# Patient Record
Sex: Female | Born: 1945 | Race: Black or African American | Hispanic: No | Smoking: Current every day smoker
Health system: Southern US, Community
[De-identification: ages and names within clinical notes are randomized; demographics above are authoritative.]

## PROBLEM LIST (undated history)

## (undated) DIAGNOSIS — G459 Transient cerebral ischemic attack, unspecified: Secondary | ICD-10-CM

## (undated) DIAGNOSIS — L309 Dermatitis, unspecified: Secondary | ICD-10-CM

## (undated) DIAGNOSIS — D649 Anemia, unspecified: Secondary | ICD-10-CM

## (undated) DIAGNOSIS — I499 Cardiac arrhythmia, unspecified: Secondary | ICD-10-CM

## (undated) DIAGNOSIS — N761 Subacute and chronic vaginitis: Secondary | ICD-10-CM

## (undated) DIAGNOSIS — I639 Cerebral infarction, unspecified: Secondary | ICD-10-CM

## (undated) DIAGNOSIS — E785 Hyperlipidemia, unspecified: Secondary | ICD-10-CM

## (undated) DIAGNOSIS — M1990 Unspecified osteoarthritis, unspecified site: Secondary | ICD-10-CM

## (undated) DIAGNOSIS — I6302 Cerebral infarction due to thrombosis of basilar artery: Secondary | ICD-10-CM

## (undated) DIAGNOSIS — M1712 Unilateral primary osteoarthritis, left knee: Secondary | ICD-10-CM

## (undated) DIAGNOSIS — K219 Gastro-esophageal reflux disease without esophagitis: Secondary | ICD-10-CM

## (undated) DIAGNOSIS — F419 Anxiety disorder, unspecified: Secondary | ICD-10-CM

## (undated) DIAGNOSIS — I10 Essential (primary) hypertension: Secondary | ICD-10-CM

## (undated) DIAGNOSIS — IMO0001 Reserved for inherently not codable concepts without codable children: Secondary | ICD-10-CM

## (undated) DIAGNOSIS — N83209 Unspecified ovarian cyst, unspecified side: Secondary | ICD-10-CM

## (undated) DIAGNOSIS — M47816 Spondylosis without myelopathy or radiculopathy, lumbar region: Secondary | ICD-10-CM

## (undated) HISTORY — DX: Cerebral infarction due to thrombosis of basilar artery: I63.02

## (undated) HISTORY — DX: Unilateral primary osteoarthritis, left knee: M17.12

## (undated) HISTORY — DX: Transient cerebral ischemic attack, unspecified: G45.9

## (undated) HISTORY — DX: Dermatitis, unspecified: L30.9

## (undated) HISTORY — DX: Subacute and chronic vaginitis: N76.1

## (undated) HISTORY — DX: Unspecified ovarian cyst, unspecified side: N83.209

## (undated) HISTORY — DX: Cerebral infarction, unspecified: I63.9

## (undated) HISTORY — DX: Spondylosis without myelopathy or radiculopathy, lumbar region: M47.816

---

## 1979-05-15 HISTORY — PX: OVARIAN CYST SURGERY: SHX726

## 1998-08-18 ENCOUNTER — Emergency Department (HOSPITAL_COMMUNITY): Admission: EM | Admit: 1998-08-18 | Discharge: 1998-08-18 | Payer: Self-pay | Admitting: Emergency Medicine

## 1999-01-11 ENCOUNTER — Emergency Department (HOSPITAL_COMMUNITY): Admission: EM | Admit: 1999-01-11 | Discharge: 1999-01-11 | Payer: Self-pay

## 2000-04-20 ENCOUNTER — Other Ambulatory Visit: Admission: RE | Admit: 2000-04-20 | Discharge: 2000-04-20 | Payer: Self-pay | Admitting: Family Medicine

## 2000-11-26 ENCOUNTER — Emergency Department (HOSPITAL_COMMUNITY): Admission: EM | Admit: 2000-11-26 | Discharge: 2000-11-26 | Payer: Self-pay | Admitting: *Deleted

## 2001-02-27 ENCOUNTER — Emergency Department (HOSPITAL_COMMUNITY): Admission: EM | Admit: 2001-02-27 | Discharge: 2001-02-27 | Payer: Self-pay | Admitting: Emergency Medicine

## 2001-08-09 ENCOUNTER — Emergency Department (HOSPITAL_COMMUNITY): Admission: EM | Admit: 2001-08-09 | Discharge: 2001-08-09 | Payer: Self-pay | Admitting: Emergency Medicine

## 2001-08-09 ENCOUNTER — Encounter: Payer: Self-pay | Admitting: Emergency Medicine

## 2002-02-01 ENCOUNTER — Emergency Department (HOSPITAL_COMMUNITY): Admission: EM | Admit: 2002-02-01 | Discharge: 2002-02-01 | Payer: Self-pay

## 2002-03-06 ENCOUNTER — Encounter: Payer: Self-pay | Admitting: Emergency Medicine

## 2002-03-06 ENCOUNTER — Emergency Department (HOSPITAL_COMMUNITY): Admission: EM | Admit: 2002-03-06 | Discharge: 2002-03-06 | Payer: Self-pay | Admitting: *Deleted

## 2002-08-05 ENCOUNTER — Emergency Department (HOSPITAL_COMMUNITY): Admission: EM | Admit: 2002-08-05 | Discharge: 2002-08-05 | Payer: Self-pay | Admitting: Emergency Medicine

## 2004-09-15 ENCOUNTER — Ambulatory Visit: Payer: Self-pay | Admitting: Internal Medicine

## 2004-09-25 ENCOUNTER — Ambulatory Visit: Payer: Self-pay | Admitting: Internal Medicine

## 2005-06-02 ENCOUNTER — Ambulatory Visit (HOSPITAL_COMMUNITY): Admission: RE | Admit: 2005-06-02 | Discharge: 2005-06-02 | Payer: Self-pay | Admitting: Internal Medicine

## 2005-06-02 ENCOUNTER — Ambulatory Visit: Payer: Self-pay | Admitting: Internal Medicine

## 2005-11-08 ENCOUNTER — Ambulatory Visit: Payer: Self-pay | Admitting: Internal Medicine

## 2006-04-05 ENCOUNTER — Ambulatory Visit: Payer: Self-pay | Admitting: Internal Medicine

## 2006-04-18 ENCOUNTER — Ambulatory Visit: Payer: Self-pay | Admitting: Internal Medicine

## 2006-05-08 ENCOUNTER — Emergency Department (HOSPITAL_COMMUNITY): Admission: EM | Admit: 2006-05-08 | Discharge: 2006-05-08 | Payer: Self-pay | Admitting: Family Medicine

## 2006-05-30 ENCOUNTER — Emergency Department (HOSPITAL_COMMUNITY): Admission: EM | Admit: 2006-05-30 | Discharge: 2006-05-30 | Payer: Self-pay | Admitting: Emergency Medicine

## 2006-07-29 DIAGNOSIS — I10 Essential (primary) hypertension: Secondary | ICD-10-CM

## 2006-07-29 DIAGNOSIS — K649 Unspecified hemorrhoids: Secondary | ICD-10-CM | POA: Insufficient documentation

## 2006-07-29 DIAGNOSIS — F17200 Nicotine dependence, unspecified, uncomplicated: Secondary | ICD-10-CM | POA: Insufficient documentation

## 2007-09-03 ENCOUNTER — Emergency Department (HOSPITAL_COMMUNITY): Admission: EM | Admit: 2007-09-03 | Discharge: 2007-09-03 | Payer: Self-pay | Admitting: Family Medicine

## 2007-10-25 ENCOUNTER — Emergency Department (HOSPITAL_COMMUNITY): Admission: EM | Admit: 2007-10-25 | Discharge: 2007-10-25 | Payer: Self-pay | Admitting: Family Medicine

## 2007-11-29 ENCOUNTER — Ambulatory Visit: Payer: Self-pay | Admitting: Hospitalist

## 2007-11-29 ENCOUNTER — Encounter (INDEPENDENT_AMBULATORY_CARE_PROVIDER_SITE_OTHER): Payer: Self-pay | Admitting: Internal Medicine

## 2007-11-29 LAB — CONVERTED CEMR LAB
Glucose, Bld: 89 mg/dL (ref 70–99)
Potassium: 4 meq/L (ref 3.5–5.3)
Sodium: 142 meq/L (ref 135–145)

## 2008-05-13 ENCOUNTER — Emergency Department (HOSPITAL_COMMUNITY): Admission: EM | Admit: 2008-05-13 | Discharge: 2008-05-13 | Payer: Self-pay | Admitting: Emergency Medicine

## 2008-07-04 ENCOUNTER — Emergency Department (HOSPITAL_COMMUNITY): Admission: EM | Admit: 2008-07-04 | Discharge: 2008-07-04 | Payer: Self-pay | Admitting: Emergency Medicine

## 2008-08-09 ENCOUNTER — Emergency Department (HOSPITAL_COMMUNITY): Admission: EM | Admit: 2008-08-09 | Discharge: 2008-08-09 | Payer: Self-pay | Admitting: Emergency Medicine

## 2008-10-20 ENCOUNTER — Emergency Department (HOSPITAL_COMMUNITY): Admission: EM | Admit: 2008-10-20 | Discharge: 2008-10-20 | Payer: Self-pay | Admitting: Family Medicine

## 2008-12-11 ENCOUNTER — Emergency Department (HOSPITAL_COMMUNITY): Admission: EM | Admit: 2008-12-11 | Discharge: 2008-12-11 | Payer: Self-pay | Admitting: Family Medicine

## 2008-12-15 ENCOUNTER — Emergency Department (HOSPITAL_COMMUNITY): Admission: EM | Admit: 2008-12-15 | Discharge: 2008-12-15 | Payer: Self-pay | Admitting: Family Medicine

## 2008-12-20 ENCOUNTER — Encounter: Payer: Self-pay | Admitting: Internal Medicine

## 2008-12-20 ENCOUNTER — Ambulatory Visit: Payer: Self-pay | Admitting: Internal Medicine

## 2008-12-20 DIAGNOSIS — J309 Allergic rhinitis, unspecified: Secondary | ICD-10-CM | POA: Insufficient documentation

## 2008-12-20 DIAGNOSIS — M170 Bilateral primary osteoarthritis of knee: Secondary | ICD-10-CM

## 2008-12-24 ENCOUNTER — Encounter (INDEPENDENT_AMBULATORY_CARE_PROVIDER_SITE_OTHER): Payer: Self-pay | Admitting: *Deleted

## 2008-12-24 ENCOUNTER — Ambulatory Visit: Payer: Self-pay | Admitting: Sports Medicine

## 2008-12-24 DIAGNOSIS — Z8739 Personal history of other diseases of the musculoskeletal system and connective tissue: Secondary | ICD-10-CM

## 2008-12-25 ENCOUNTER — Telehealth (INDEPENDENT_AMBULATORY_CARE_PROVIDER_SITE_OTHER): Payer: Self-pay | Admitting: Internal Medicine

## 2008-12-25 ENCOUNTER — Telehealth (INDEPENDENT_AMBULATORY_CARE_PROVIDER_SITE_OTHER): Payer: Self-pay | Admitting: *Deleted

## 2009-01-10 ENCOUNTER — Ambulatory Visit: Payer: Self-pay | Admitting: Infectious Disease

## 2009-01-21 ENCOUNTER — Telehealth: Payer: Self-pay | Admitting: Internal Medicine

## 2009-02-17 ENCOUNTER — Encounter (INDEPENDENT_AMBULATORY_CARE_PROVIDER_SITE_OTHER): Payer: Self-pay | Admitting: Internal Medicine

## 2009-02-17 ENCOUNTER — Telehealth (INDEPENDENT_AMBULATORY_CARE_PROVIDER_SITE_OTHER): Payer: Self-pay | Admitting: Internal Medicine

## 2009-07-01 ENCOUNTER — Emergency Department (HOSPITAL_COMMUNITY): Admission: EM | Admit: 2009-07-01 | Discharge: 2009-07-01 | Payer: Self-pay | Admitting: Emergency Medicine

## 2009-10-06 ENCOUNTER — Emergency Department (HOSPITAL_COMMUNITY): Admission: EM | Admit: 2009-10-06 | Discharge: 2009-10-06 | Payer: Self-pay | Admitting: Emergency Medicine

## 2009-11-10 ENCOUNTER — Emergency Department (HOSPITAL_COMMUNITY): Admission: EM | Admit: 2009-11-10 | Discharge: 2009-11-10 | Payer: Self-pay | Admitting: Family Medicine

## 2009-11-16 ENCOUNTER — Emergency Department (HOSPITAL_COMMUNITY): Admission: EM | Admit: 2009-11-16 | Discharge: 2009-11-16 | Payer: Self-pay | Admitting: Family Medicine

## 2010-01-26 ENCOUNTER — Ambulatory Visit: Payer: Self-pay | Admitting: Infectious Disease

## 2010-01-26 ENCOUNTER — Telehealth (INDEPENDENT_AMBULATORY_CARE_PROVIDER_SITE_OTHER): Payer: Self-pay | Admitting: Internal Medicine

## 2010-03-17 ENCOUNTER — Emergency Department (HOSPITAL_COMMUNITY): Admission: EM | Admit: 2010-03-17 | Discharge: 2010-03-17 | Payer: Self-pay | Admitting: Emergency Medicine

## 2010-05-20 ENCOUNTER — Emergency Department (HOSPITAL_COMMUNITY): Admission: EM | Admit: 2010-05-20 | Discharge: 2010-05-21 | Payer: Self-pay | Admitting: Emergency Medicine

## 2010-05-24 ENCOUNTER — Emergency Department (HOSPITAL_COMMUNITY): Admission: EM | Admit: 2010-05-24 | Discharge: 2010-05-24 | Payer: Self-pay | Admitting: Emergency Medicine

## 2010-07-29 ENCOUNTER — Emergency Department (HOSPITAL_COMMUNITY): Admission: EM | Admit: 2010-07-29 | Discharge: 2010-07-29 | Payer: Self-pay | Admitting: Family Medicine

## 2010-09-28 ENCOUNTER — Emergency Department (HOSPITAL_COMMUNITY)
Admission: EM | Admit: 2010-09-28 | Discharge: 2010-09-28 | Payer: Self-pay | Source: Home / Self Care | Admitting: Emergency Medicine

## 2010-10-05 ENCOUNTER — Emergency Department (HOSPITAL_COMMUNITY)
Admission: EM | Admit: 2010-10-05 | Discharge: 2010-10-05 | Payer: Self-pay | Source: Home / Self Care | Admitting: Emergency Medicine

## 2010-10-06 LAB — URIC ACID: Uric Acid, Serum: 9.2 mg/dL — ABNORMAL HIGH (ref 2.4–7.0)

## 2010-10-12 ENCOUNTER — Emergency Department (HOSPITAL_COMMUNITY)
Admission: EM | Admit: 2010-10-12 | Discharge: 2010-10-12 | Payer: Self-pay | Source: Home / Self Care | Admitting: Emergency Medicine

## 2010-10-13 NOTE — Progress Notes (Signed)
Summary: TB Skin Test  Phone Note Call from Patient   Caller: Patient Summary of Call: Pt here wants a TB skin Test for work.Angelina Ok RN  Jan 26, 2010 9:10 AM     Initial call taken by: Angelina Ok RN,  Jan 26, 2010 9:10 AM  Follow-up for Phone Call        TB skin test is fine.  She does need to come in for an annual visit to follow up on chronic medical problems for which we prescribe medications.      Immunizations Administered:  PPD Skin Test:    Vaccine Type: PPD    Site: right forearm    Mfr: San[employer]i Pasteur    Dose: 0.1 ml    Route: ID    Given by: Angelina Ok RN    Exp. Date: 06/26/2011    Lot #: [id number]

## 2010-10-13 NOTE — Assessment & Plan Note (Signed)
Summary: TB SKIN TEST/CH  Nurse Visit   Allergies: No Known Drug Allergies  PPD Results    Date of reading: 01/28/2010    Results: < 5mm    Interpretation: negative

## 2010-12-04 LAB — POCT URINALYSIS DIP (DEVICE)
Glucose, UA: NEGATIVE mg/dL
Ketones, ur: NEGATIVE mg/dL
Specific Gravity, Urine: 1.02 (ref 1.005–1.030)
Urobilinogen, UA: 0.2 mg/dL (ref 0.0–1.0)

## 2011-03-21 ENCOUNTER — Emergency Department (HOSPITAL_COMMUNITY)
Admission: EM | Admit: 2011-03-21 | Discharge: 2011-03-21 | Disposition: A | Discharge status: Home / Self Care | Payer: Self-pay | Attending: Emergency Medicine | Admitting: Emergency Medicine

## 2011-03-21 DIAGNOSIS — L708 Other acne: Secondary | ICD-10-CM | POA: Insufficient documentation

## 2011-03-21 DIAGNOSIS — M109 Gout, unspecified: Secondary | ICD-10-CM | POA: Insufficient documentation

## 2011-03-21 DIAGNOSIS — I10 Essential (primary) hypertension: Secondary | ICD-10-CM | POA: Insufficient documentation

## 2011-08-12 ENCOUNTER — Telehealth (HOSPITAL_COMMUNITY): Payer: Self-pay | Admitting: *Deleted

## 2011-09-14 ENCOUNTER — Emergency Department (INDEPENDENT_AMBULATORY_CARE_PROVIDER_SITE_OTHER)
Admission: EM | Admit: 2011-09-14 | Discharge: 2011-09-14 | Disposition: A | Discharge status: Home / Self Care | Payer: Self-pay | Source: Home / Self Care

## 2011-09-14 ENCOUNTER — Encounter: Payer: Self-pay | Admitting: *Deleted

## 2011-09-14 ENCOUNTER — Telehealth (HOSPITAL_COMMUNITY): Payer: Self-pay | Admitting: *Deleted

## 2011-09-14 DIAGNOSIS — M25579 Pain in unspecified ankle and joints of unspecified foot: Secondary | ICD-10-CM

## 2011-09-14 DIAGNOSIS — J309 Allergic rhinitis, unspecified: Secondary | ICD-10-CM

## 2011-09-14 DIAGNOSIS — M109 Gout, unspecified: Secondary | ICD-10-CM

## 2011-09-14 DIAGNOSIS — L720 Epidermal cyst: Secondary | ICD-10-CM

## 2011-09-14 DIAGNOSIS — M25571 Pain in right ankle and joints of right foot: Secondary | ICD-10-CM

## 2011-09-14 DIAGNOSIS — L723 Sebaceous cyst: Secondary | ICD-10-CM

## 2011-09-14 HISTORY — DX: Essential (primary) hypertension: I10

## 2011-09-14 LAB — URIC ACID: Uric Acid, Serum: 9.7 mg/dL — ABNORMAL HIGH (ref 2.4–7.0)

## 2011-09-14 MED ORDER — TRAMADOL HCL 50 MG PO TABS: 50.0000 mg | ORAL_TABLET | Freq: Four times a day (QID) | ORAL | Status: AC | PRN

## 2011-09-14 MED ORDER — METHYLPREDNISOLONE ACETATE 80 MG/ML IJ SUSP
80.0000 mg | Freq: Once | INTRAMUSCULAR | Status: AC
  Administered 2011-09-14: 80 mg via INTRAMUSCULAR

## 2011-09-14 MED ORDER — METHYLPREDNISOLONE ACETATE 80 MG/ML IJ SUSP
INTRAMUSCULAR | Status: AC
  Filled 2011-09-14: qty 1

## 2011-09-14 MED ORDER — INDOMETHACIN 50 MG PO CAPS: 50.0000 mg | ORAL_CAPSULE | Freq: Two times a day (BID) | ORAL | Status: AC

## 2011-09-14 NOTE — ED Provider Notes (Signed)
History     CSN: [id number]  Arrival date & time 09/14/11  1309   None     Chief Complaint  Patient presents with  . Foot Pain    (Consider location/radiation/quality/duration/timing/severity/associated sxs/prior treatment) HPI Comments: Pt initially states she has had pain bilat lower extremities from her toes radiating up to her shins, and that she knows this is a gout flare up. After further discussion she admits that she had knee pain a few weeks ago but resolved. Is currently having pain and swelling Rt ankle area and top of Rt foot. Has had some discomfort also in Lt ankle, but not as bad as Rt. She denies trauma. She has a hx of gout and is no longer taking medications for gout. She has an appt at the [facility] clinic this month hoping to get enrolled in a gout study. She also is concerned about a lump on the Lt side of her face which she has had "for a long time." It recently became reddened and swollen and she was treated with abx by the [facility]. The redness and swelling resolved but she is concerned because "the core is still there."   The history is provided by the patient.    Past Medical History  Diagnosis Date  . Hypertension   . Gout     Past Surgical History  Procedure Date  . Cesarean section     History reviewed. No pertinent family history.  History  Substance Use Topics  . Smoking status: Current Some Day Smoker  . Smokeless tobacco: Not on file  . Alcohol Use: Yes    OB History    Grav Para Term Preterm Abortions TAB SAB Ect Mult Living                  Review of Systems  Constitutional: Negative for fever and chills.  Respiratory: Negative for cough and shortness of breath.   Cardiovascular: Negative for chest pain and leg swelling.  Musculoskeletal: Positive for joint swelling.  Skin: Negative for rash and wound.  Neurological: Negative for weakness and numbness.    Allergies  Review of patient's allergies indicates not on  file.  Home Medications   Current Outpatient Rx  Name Route Sig Dispense Refill  . ALPRAZOLAM 0.5 MG PO TABS Oral Take 0.5 mg by mouth 2 (two) times daily.      [name] Kitchen AMLODIPINE BESYLATE 10 MG PO TABS Oral Take 10 mg by mouth daily.      [name] Kitchen HYDROCHLOROTHIAZIDE 25 MG PO TABS Oral Take 25 mg by mouth daily.      . INDOMETHACIN 50 MG PO CAPS Oral Take 1 capsule (50 mg total) by mouth 2 (two) times daily with a meal. 20 capsule 0  . TRAMADOL HCL 50 MG PO TABS Oral Take 1 tablet (50 mg total) by mouth every 6 (six) hours as needed for pain. Maximum dose= 8 tablets per day 12 tablet 0    BP 133/79  Pulse 84  Temp(Src) 98.2 F (36.8 C) (Oral)  Resp 18  SpO2 96%  Physical Exam  Nursing note and vitals reviewed. Constitutional: She appears well-developed and well-nourished. No distress.  Cardiovascular: Normal rate, regular rhythm and normal heart sounds.   Pulmonary/Chest: Effort normal and breath sounds normal. No respiratory distress.  Musculoskeletal:       Right ankle: She exhibits swelling (mild swelling anteriorly). She exhibits normal range of motion, no ecchymosis, no deformity, no laceration and normal pulse. tenderness (mild swelling  anteriorly). No lateral malleolus, no medial malleolus, no AITFL, no CF ligament, no posterior TFL, no head of 5th metatarsal and no proximal fibula tenderness found. Achilles tendon normal.       Left ankle: Normal. She exhibits normal range of motion, no swelling, no ecchymosis, no deformity, no laceration and normal pulse. no tenderness. No lateral malleolus, no medial malleolus, no AITFL, no CF ligament, no posterior TFL, no head of 5th metatarsal and no proximal fibula tenderness found.       Right foot: She exhibits tenderness (mid foot) and swelling (mid foot with mild erythema). She exhibits normal range of motion, no bony tenderness, normal capillary refill, no crepitus, no deformity and no laceration.       Feet:  Neurological: She is alert.  Skin:  Skin is warm and dry.     Psychiatric: She has a normal mood and affect.    ED Course  Procedures (including critical care time)   Labs Reviewed  URIC ACID   No results found.   1. Ankle pain, right   2. Gout   3. Epidermal cyst       MDM  Joint pain with redness and swelling of Rt ankle. Pt has hx of gout and currently not taking prophylaxis. Epidermal cyst Lt face w/o s/s of infection. Advised pt to f/u with PCP.         Melody Comas, [place] 09/14/11 [phone]

## 2011-09-14 NOTE — ED Notes (Signed)
PT  HAS  PAIN  BOTH  FEET    AND  KNEES   X   3  WEEKS   SHE    REPORTS  SHE  HAS  HAD  GOUT  IN   THE  PAST  AND  SHE     STATES  SHE  IS  ON A  STUDY  FOR    THE  GOUT          SHE  ALSO  REPORTS  A  REOCCURING  CYST  OF THE L SIDE  OF HER FACE  WHICH IS PAINFULL  TO  TOUCH

## 2011-09-15 ENCOUNTER — Telehealth (HOSPITAL_COMMUNITY): Payer: Self-pay | Admitting: *Deleted

## 2011-09-15 NOTE — ED Notes (Signed)
Pt. called for work note and copy of her lab result.Pt. Instructed she would have to come back and sign a medical release form and show a picture ID to get her lab report. Pt. wants to be out of work for 1 month. I told her we could give her a work note for 2 days.  She will need to f[facility]u with [facility] if she needs to be out of work longer. Discussed with Esperanza Sheets PA and she said she can return 1/3. Note done as directed and left at the front desk. Vassie Moselle 09/15/2011

## 2011-09-18 NOTE — ED Provider Notes (Signed)
Medical screening examination/treatment/procedure(s) were performed by non-physician practitioner and as supervising physician I was immediately available for consultation/collaboration.  Luiz Blare MD   Luiz Blare, MD 09/18/11 [phone]

## 2012-07-31 ENCOUNTER — Emergency Department (INDEPENDENT_AMBULATORY_CARE_PROVIDER_SITE_OTHER)
Admission: EM | Admit: 2012-07-31 | Discharge: 2012-07-31 | Disposition: A | Discharge status: Home / Self Care | Payer: Self-pay | Source: Home / Self Care

## 2012-07-31 ENCOUNTER — Encounter (HOSPITAL_COMMUNITY): Payer: Self-pay | Admitting: Emergency Medicine

## 2012-07-31 DIAGNOSIS — Z862 Personal history of diseases of the blood and blood-forming organs and certain disorders involving the immune mechanism: Secondary | ICD-10-CM

## 2012-07-31 DIAGNOSIS — Z8739 Personal history of other diseases of the musculoskeletal system and connective tissue: Secondary | ICD-10-CM

## 2012-07-31 LAB — POCT URINALYSIS DIP (DEVICE)
Bilirubin Urine: NEGATIVE
Glucose, UA: NEGATIVE mg/dL
Hgb urine dipstick: NEGATIVE
Ketones, ur: NEGATIVE mg/dL
Specific Gravity, Urine: <=1.005 (ref 1.005–1.030)
Urobilinogen, UA: 0.2 mg/dL (ref 0.0–1.0)

## 2012-07-31 MED ORDER — HYDROCODONE-ACETAMINOPHEN 5-325 MG PO TABS: 2.0000 | ORAL_TABLET | ORAL | Status: DC | PRN

## 2012-07-31 NOTE — ED Notes (Signed)
Pt here for multiple complaints. Pt states she was at a [employer] this weekend and her "gout pain med" went missing, shes not sur if she lost it, or it was stolen. Pt states she takes vicodin 3 times a day and she can not afford to see her doctor for the refill until next month. Pt c/o gout pain in her knees and toes. Pt also complain of vaginal itching, denies excess discharge, dysuria, or frequency.

## 2012-07-31 NOTE — ED Provider Notes (Signed)
History     CSN: [id number]  Arrival date & time 07/31/12  1357   None     Chief Complaint  Patient presents with  . Gout    (Consider location/radiation/quality/duration/timing/severity/associated sxs/prior treatment) HPI Comments: 66 year old patient of Dr. Roseanne Reno says she she lost her medicine or either stolen . Takes indomethacin and Vicodin for pain. She states she called her doctor for an appointment for a refill but he did not have anything open today but did make an appointment for a few days from now. She is complaining of pain in the bilateral knees with right greater than the left. She is here to get a refill on her Vicodin. I did explain to her that this is not generally something that we do.   Past Medical History  Diagnosis Date  . Hypertension   . Gout     Past Surgical History  Procedure Date  . Cesarean section     History reviewed. No pertinent family history.  History  Substance Use Topics  . Smoking status: Current Some Day Smoker  . Smokeless tobacco: Not on file  . Alcohol Use: Yes    OB History    Grav Para Term Preterm Abortions TAB SAB Ect Mult Living                  Review of Systems  Constitutional: Negative.   Respiratory: Negative.   Cardiovascular: Negative.   Gastrointestinal: Negative.   Musculoskeletal: Positive for arthralgias.  Skin: Negative.   Psychiatric/Behavioral: Negative.     Allergies  Review of patient's allergies indicates no known allergies.  Home Medications   Current Outpatient Rx  Name  Route  Sig  Dispense  Refill  . ALPRAZOLAM 0.5 MG PO TABS   Oral   Take 0.5 mg by mouth 2 (two) times daily.           [name] Kitchen AMLODIPINE BESYLATE 10 MG PO TABS   Oral   Take 10 mg by mouth daily.           [name] Kitchen HYDROCHLOROTHIAZIDE 25 MG PO TABS   Oral   Take 25 mg by mouth daily.           [name] Kitchen HYDROCODONE-ACETAMINOPHEN 5-500 MG PO TABS   Oral   Take 1 tablet by mouth every 6 (six) hours as needed.         .  INDOMETHACIN 50 MG PO CAPS   Oral   Take 50 mg by mouth 2 (two) times daily with a meal.         . HYDROCODONE-ACETAMINOPHEN 5-325 MG PO TABS   Oral   Take 2 tablets by mouth every 4 (four) hours as needed for pain.   10 tablet   0     BP 124/81  Pulse 85  Temp 98.4 F (36.9 C) (Oral)  Resp 18  SpO2 100%  Physical Exam  Constitutional: She is oriented to person, place, and time. She appears well-developed and well-nourished. No distress.  Neck: Normal range of motion. Neck supple.  Pulmonary/Chest: Effort normal.  Musculoskeletal: Normal range of motion. She exhibits no edema and no tenderness.       No swelling, tenderness, erythema or decrease in range of motion of the knees.  Neurological: She is alert and oriented to person, place, and time.  Skin: Skin is warm and dry.  Psychiatric: She has a normal mood and affect.    ED Course  Procedures (including critical care time)  Labs Reviewed  POCT URINALYSIS DIP (DEVICE)   No results found.   1. Hx of gout       MDM  Advised needs testing of kidneys if on indomethacin for proolnged period of time.  Norco 5 # 10. Only . Advised we will not be able to supplement her pain meds anymore. Follow with your doctor.         Hayden Rasmussen, NP 07/31/12 1546

## 2012-08-01 NOTE — ED Provider Notes (Signed)
Medical screening examination/treatment/procedure(s) were performed by resident physician or non-physician practitioner and as supervising physician I was immediately available for consultation/collaboration.   Aliyanna Wassmer DOUGLAS MD.    Calyn Rubi D Valoria Tamburri, MD 08/01/12 1044 

## 2012-12-12 DIAGNOSIS — E785 Hyperlipidemia, unspecified: Secondary | ICD-10-CM

## 2012-12-12 HISTORY — DX: Hyperlipidemia, unspecified: E78.5

## 2013-01-02 ENCOUNTER — Encounter (HOSPITAL_COMMUNITY): Payer: Self-pay | Admitting: *Deleted

## 2013-01-02 ENCOUNTER — Emergency Department (INDEPENDENT_AMBULATORY_CARE_PROVIDER_SITE_OTHER)
Admission: EM | Admit: 2013-01-02 | Discharge: 2013-01-02 | Disposition: A | Discharge status: Home / Self Care | Payer: Medicaid Other | Source: Home / Self Care | Attending: Family Medicine | Admitting: Family Medicine

## 2013-01-02 DIAGNOSIS — M109 Gout, unspecified: Secondary | ICD-10-CM

## 2013-01-02 DIAGNOSIS — B373 Candidiasis of vulva and vagina: Secondary | ICD-10-CM

## 2013-01-02 HISTORY — DX: Hyperlipidemia, unspecified: E78.5

## 2013-01-02 MED ORDER — KETOROLAC TROMETHAMINE 10 MG PO TABS: 10.0000 mg | ORAL_TABLET | Freq: Four times a day (QID) | ORAL | Status: DC | PRN

## 2013-01-02 MED ORDER — FLUCONAZOLE 150 MG PO TABS: 150.0000 mg | ORAL_TABLET | Freq: Once | ORAL | Status: DC

## 2013-01-02 MED ORDER — TERCONAZOLE 80 MG VA SUPP: 80.0000 mg | Freq: Every day | VAGINAL | Status: DC

## 2013-01-02 NOTE — ED Notes (Signed)
C/O  "gout" left knee - has "banged it" 4 times in the past 3 days. Left her Vicodin in "[place]".  Also c/o "either UTI or yeast infection" has itching in vaginal area - no discharge. Has been on Septra for UTI

## 2013-01-02 NOTE — ED Provider Notes (Signed)
History     CSN: [id number]  Arrival date & time 01/02/13  1118   First MD Initiated Contact with Patient 01/02/13 1141      Chief Complaint  Patient presents with  . Gout    (Consider location/radiation/quality/duration/timing/severity/associated sxs/prior treatment) Patient is a 67 y.o. female presenting with knee pain. The history is provided by the patient.  Knee Pain Location:  Knee Time since incident:  3 days Injury: yes   Mechanism of injury comment:  Continues to knock knee  Knee location:  L knee Pain details:    Quality:  Sharp   Radiates to:  Does not radiate   Severity:  Mild Chronicity:  Chronic (h/o gout with disability.) Dislocation: no   Associated symptoms comment:  Gout problem in knee.   Past Medical History  Diagnosis Date  . Hypertension   . Gout   . Hyperlipidemia April 2014    Past Surgical History  Procedure Laterality Date  . Cesarean section    . Ovarian cyst surgery Right 1980's    Family History  Problem Relation Age of Onset  . Cancer Mother   . Hypertension Mother   . Diabetes Mother   . Diabetes Sister   . Hypertension Sister     History  Substance Use Topics  . Smoking status: Current Some Day Smoker    Types: Cigarettes  . Smokeless tobacco: Never Used  . Alcohol Use: No    OB History   Grav Para Term Preterm Abortions TAB SAB Ect Mult Living   4 3 3  1            Review of Systems  Constitutional: Negative.   Genitourinary: Negative for vaginal bleeding, vaginal discharge, menstrual problem and pelvic pain.  Musculoskeletal: Negative for joint swelling and gait problem.    Allergies  Review of patient's allergies indicates no known allergies.  Home Medications   Current Outpatient Rx  Name  Route  Sig  Dispense  Refill  . ALPRAZolam (XANAX) 0.5 MG tablet   Oral   Take 0.5 mg by mouth 2 (two) times daily.           [name] Kitchen amLODipine (NORVASC) 10 MG tablet   Oral   Take 10 mg by mouth daily.           . hydrochlorothiazide (HYDRODIURIL) 25 MG tablet   Oral   Take 25 mg by mouth daily.           [name] Kitchen HYDROcodone-acetaminophen (NORCO/VICODIN) 5-325 MG per tablet   Oral   Take 2 tablets by mouth every 4 (four) hours as needed for pain.   10 tablet   0   . HYDROcodone-acetaminophen (VICODIN) 5-500 MG per tablet   Oral   Take 1 tablet by mouth every 6 (six) hours as needed.         . indomethacin (INDOCIN) 50 MG capsule   Oral   Take 50 mg by mouth 2 (two) times daily with a meal.         . lovastatin (MEVACOR) 20 MG tablet   Oral   Take 20 mg by mouth at bedtime.         . fluconazole (DIFLUCAN) 150 MG tablet   Oral   Take 1 tablet (150 mg total) by mouth once.   1 tablet   0   . ketorolac (TORADOL) 10 MG tablet   Oral   Take 1 tablet (10 mg total) by mouth every 6 (six) hours as  needed for pain.   20 tablet   0   . terconazole (TERAZOL 3) 80 MG vaginal suppository   Vaginal   Place 1 suppository (80 mg total) vaginally at bedtime.   3 suppository   0     BP 111/59  Pulse 88  Temp(Src) 98.2 F (36.8 C) (Oral)  SpO2 98%  Physical Exam  Nursing note and vitals reviewed. Constitutional: She is oriented to person, place, and time. She appears well-developed and well-nourished.  Musculoskeletal: She exhibits tenderness.  Crepitation to left knee, no effusion, no warmth or erythema.  Neurological: She is alert and oriented to person, place, and time.  Skin: Skin is warm and dry.    ED Course  Procedures (including critical care time)  Labs Reviewed - No data to display No results found.   1. Gout   2. Vaginal yeast infection       MDM          Linna Hoff, MD 01/02/13 1221

## 2013-02-12 ENCOUNTER — Encounter (HOSPITAL_COMMUNITY): Payer: Self-pay | Admitting: *Deleted

## 2013-02-12 ENCOUNTER — Emergency Department (HOSPITAL_COMMUNITY)
Admission: EM | Admit: 2013-02-12 | Discharge: 2013-02-12 | Disposition: A | Discharge status: Home / Self Care | Payer: Medicaid Other | Attending: Emergency Medicine | Admitting: Emergency Medicine

## 2013-02-12 DIAGNOSIS — E785 Hyperlipidemia, unspecified: Secondary | ICD-10-CM | POA: Insufficient documentation

## 2013-02-12 DIAGNOSIS — I10 Essential (primary) hypertension: Secondary | ICD-10-CM | POA: Insufficient documentation

## 2013-02-12 DIAGNOSIS — Z79899 Other long term (current) drug therapy: Secondary | ICD-10-CM | POA: Insufficient documentation

## 2013-02-12 DIAGNOSIS — M109 Gout, unspecified: Secondary | ICD-10-CM

## 2013-02-12 DIAGNOSIS — F17200 Nicotine dependence, unspecified, uncomplicated: Secondary | ICD-10-CM | POA: Insufficient documentation

## 2013-02-12 DIAGNOSIS — Z760 Encounter for issue of repeat prescription: Secondary | ICD-10-CM

## 2013-02-12 MED ORDER — HYDROCODONE-ACETAMINOPHEN 5-325 MG PO TABS: 1.0000 | ORAL_TABLET | Freq: Three times a day (TID) | ORAL | Status: DC | PRN

## 2013-02-12 NOTE — ED Notes (Signed)
Pt is here with gout problems and was told to come here to get Vicodin.

## 2013-02-12 NOTE — ED Provider Notes (Signed)
History    This chart was scribed for Raymon Mutton, PA working with Devoria Albe, MD by ED Scribe, Burman Nieves. This patient was seen in room TR05C/TR05C and the patient's care was started at 6:29 PM.   CSN: [id number]  Arrival date & time 02/12/13  1531   None     Chief Complaint  Patient presents with  . Gout    (Consider location/radiation/quality/duration/timing/severity/associated sxs/prior treatment) The history is provided by the patient. No language interpreter was used.   HPI Comments: Mercedes Gardner is a 67 y.o. female with h/o HTN and gout who presents to the Emergency Department complaining of an intermittent gout flare up that started 3 days ago. As of now her left leg hurts worse than her right. She states she has had two flare ups this month. She complains that the shooting pain radiates up from her toes to her knees bilaterally when flare ups occur. She states she went to [facility] today to see her PCP (Dr. Roseanne Reno) for Vicodin refill, but he was not there so they told her to come here for a Vicodin refill. Pt has an appointment the 16 th of June and just needs some to get her through until then. She states she does not abuse it and only takes it as needed. Pt states she still has her gout medication. Pt denies fever, chills, cough, nausea, vomiting, diarrhea, SOB, weakness, and any other associated symptoms. She admits to smoking tobacco but is trying to quit.   Past Medical History  Diagnosis Date  . Hypertension   . Gout   . Hyperlipidemia April 2014    Past Surgical History  Procedure Laterality Date  . Cesarean section    . Ovarian cyst surgery Right 1980's    Family History  Problem Relation Age of Onset  . Cancer Mother   . Hypertension Mother   . Diabetes Mother   . Diabetes Sister   . Hypertension Sister     History  Substance Use Topics  . Smoking status: Current Some Day Smoker    Types: Cigarettes  . Smokeless tobacco: Never  Used  . Alcohol Use: No    OB History   Grav Para Term Preterm Abortions TAB SAB Ect Mult Living   4 3 3  1            Review of Systems  Musculoskeletal: Positive for myalgias and arthralgias.  All other systems reviewed and are negative.    Allergies  Review of patient's allergies indicates no known allergies.  Home Medications   Current Outpatient Rx  Name  Route  Sig  Dispense  Refill  . ALPRAZolam (XANAX) 0.5 MG tablet   Oral   Take 0.5 mg by mouth 2 (two) times daily.           [name] Kitchen amLODipine (NORVASC) 10 MG tablet   Oral   Take 10 mg by mouth daily.           . hydrochlorothiazide (HYDRODIURIL) 25 MG tablet   Oral   Take 25 mg by mouth daily.           [name] Kitchen HYDROcodone-acetaminophen (NORCO/VICODIN) 5-325 MG per tablet   Oral   Take 2 tablets by mouth every 4 (four) hours as needed for pain.         . indomethacin (INDOCIN) 50 MG capsule   Oral   Take 50 mg by mouth 2 (two) times daily with a meal.         .  ketorolac (TORADOL) 10 MG tablet   Oral   Take 10 mg by mouth every 6 (six) hours as needed for pain.         [name] Kitchen lovastatin (MEVACOR) 20 MG tablet   Oral   Take 20 mg by mouth at bedtime.         [name] Kitchen HYDROcodone-acetaminophen (NORCO) 5-325 MG per tablet   Oral   Take 1 tablet by mouth every 8 (eight) hours as needed for pain.   10 tablet   0     BP 127/74  Pulse 86  Temp(Src) 98.3 F (36.8 C) (Oral)  Resp 20  Ht 5' 5.5" (1.664 m)  Wt 147 lb (66.679 kg)  BMI 24.08 kg/m2  SpO2 100%  Physical Exam  Nursing note and vitals reviewed. Constitutional: She is oriented to person, place, and time. She appears well-developed and well-nourished. No distress.  HENT:  Head: Normocephalic and atraumatic.  Eyes: Conjunctivae and EOM are normal. Pupils are equal, round, and reactive to light.  Neck: Normal range of motion. Neck supple. No tracheal deviation present.  Cardiovascular: Normal rate, regular rhythm and normal heart sounds.  Exam  reveals no gallop and no friction rub.   No murmur heard. Pulses:      Radial pulses are 2+ on the right side, and 2+ on the left side.       Dorsalis pedis pulses are 2+ on the right side, and 2+ on the left side.  Pulmonary/Chest: Effort normal and breath sounds normal. No respiratory distress. She has no wheezes.  Abdominal: Soft. Bowel sounds are normal. She exhibits no mass. There is no tenderness. There is no guarding.  Musculoskeletal: Normal range of motion. She exhibits no tenderness.  Negative swelling, effusion, inflammation, warmth to touch to joints in the upper extremities bilaterally. Full ROM in upper and lower extremities. Strength 5/5.  Lymphadenopathy:    She has no cervical adenopathy.  Neurological: She is alert and oriented to person, place, and time.  Sensation intact in upper/lower extremities to sensation of sharp and dull touch. Gate balanced and proper.  Skin: Skin is warm and dry.  Psychiatric: She has a normal mood and affect. Her behavior is normal.    ED Course  Procedures (including critical care time) DIAGNOSTIC STUDIES: Oxygen Saturation is 100% on room air, normal by my interpretation.    COORDINATION OF CARE:  6:44 PM Discussed ED treatment with pt and pt agrees.    Labs Reviewed - No data to display No results found.   1. Medication refill   2. Gout       MDM  I personally performed the services described in this documentation, which was scribed in my presence. The recorded information has been reviewed and is accurate.  Patient presenting to ED with request for pain medications - since she ran out and went to see physician today and stated that he was not present in the office. Patient stated that she is beginning to have a flare-up of her gout starting 3 days ago. Negative erythema, inflammation, swelling, warmth to touch to upper and lower extremities bilaterally. Negative podagra. Patient stable and afebrile. Patient to be discharged.  Small dose of pain medications given - discussed with patient how to take medications - discussed precautions and disposal. Discussed with patient that ED is not meant for pain medication refill that she needs to see a physician regarding her discomfort and for pain medication refill and to be monitored. Discussed with patient to  rest and stay hydrated. Discussed with patient to monitor symptoms and if symptoms are to worsen or change to report back to the ED. Patient agreed to plan of care, understood, all questions answered.      [employer], PA-C 02/13/13 0222

## 2013-02-12 NOTE — ED Notes (Signed)
Pt to ED c/o gout in left lower leg.  Pt st's she has gout  all over but today the pain is in her left lower leg and foot.  Pt st's she needs a refill on her pain medication but is not out of her gout medication (Indocin).

## 2013-02-13 NOTE — ED Provider Notes (Signed)
Medical screening examination/treatment/procedure(s) were performed by non-physician practitioner and as supervising physician I was immediately available for consultation/collaboration. Albin Duckett, MD, FACEP   Nishika Parkhurst L Austine Wiedeman, MD 02/13/13 1240 

## 2013-03-27 ENCOUNTER — Other Ambulatory Visit (HOSPITAL_COMMUNITY)
Admission: RE | Admit: 2013-03-27 | Discharge: 2013-03-27 | Disposition: A | Discharge status: Home / Self Care | Payer: Medicaid Other | Source: Ambulatory Visit | Attending: Family Medicine | Admitting: Family Medicine

## 2013-03-27 ENCOUNTER — Encounter (HOSPITAL_COMMUNITY): Payer: Self-pay | Admitting: Emergency Medicine

## 2013-03-27 ENCOUNTER — Emergency Department (INDEPENDENT_AMBULATORY_CARE_PROVIDER_SITE_OTHER)
Admission: EM | Admit: 2013-03-27 | Discharge: 2013-03-27 | Disposition: A | Discharge status: Home / Self Care | Payer: Medicaid Other | Source: Home / Self Care

## 2013-03-27 DIAGNOSIS — B373 Candidiasis of vulva and vagina: Secondary | ICD-10-CM

## 2013-03-27 DIAGNOSIS — M25562 Pain in left knee: Secondary | ICD-10-CM

## 2013-03-27 DIAGNOSIS — M79675 Pain in left toe(s): Secondary | ICD-10-CM

## 2013-03-27 DIAGNOSIS — Z113 Encounter for screening for infections with a predominantly sexual mode of transmission: Secondary | ICD-10-CM | POA: Insufficient documentation

## 2013-03-27 DIAGNOSIS — M25569 Pain in unspecified knee: Secondary | ICD-10-CM

## 2013-03-27 DIAGNOSIS — M79609 Pain in unspecified limb: Secondary | ICD-10-CM

## 2013-03-27 DIAGNOSIS — N760 Acute vaginitis: Secondary | ICD-10-CM | POA: Insufficient documentation

## 2013-03-27 DIAGNOSIS — B3731 Acute candidiasis of vulva and vagina: Secondary | ICD-10-CM

## 2013-03-27 LAB — POCT URINALYSIS DIP (DEVICE)
Bilirubin Urine: NEGATIVE
Glucose, UA: NEGATIVE mg/dL
Hgb urine dipstick: NEGATIVE
Ketones, ur: NEGATIVE mg/dL
Specific Gravity, Urine: 1.02 (ref 1.005–1.030)
pH: 6 (ref 5.0–8.0)

## 2013-03-27 MED ORDER — ACETAMINOPHEN-CODEINE #3 300-30 MG PO TABS: 1.0000 | ORAL_TABLET | Freq: Four times a day (QID) | ORAL | Status: DC | PRN

## 2013-03-27 MED ORDER — FLUCONAZOLE 150 MG PO TABS: ORAL_TABLET | ORAL | Status: DC

## 2013-03-27 NOTE — ED Provider Notes (Signed)
Medical screening examination/treatment/procedure(s) were performed by resident physician or non-physician practitioner and as supervising physician I was immediately available for consultation/collaboration.   Barkley Bruns MD.   Linna Hoff, MD 03/27/13 [phone]

## 2013-03-27 NOTE — ED Provider Notes (Signed)
History    CSN: [id number] Arrival date & time 03/27/13  1104  First MD Initiated Contact with Patient 03/27/13 1210     Chief Complaint  Patient presents with  . Gout  . Vaginal Discharge   (Consider location/radiation/quality/duration/timing/severity/associated sxs/prior Treatment) HPI Comments: 67 year old female patient for patient of Dr. Rush Barer presents with pain in the left knee and great toe. She states that she has chronic gout and she has had a flareup for over a week. She also is complaining that she is out of her Vicodin that she takes for left leg pain. She has been receiving approximately 90 Vicodin, along with Xanax from Dr. Roseanne Reno. She is requesting a refill. She is also wondering medicine for her gallop.  Her second complaint is that of a small amount of scant vaginal discharge and vulvovaginal irritation for approximately one week.  Past Medical History  Diagnosis Date  . Hypertension   . Gout   . Hyperlipidemia April 2014   Past Surgical History  Procedure Laterality Date  . Cesarean section    . Ovarian cyst surgery Right 1980's   Family History  Problem Relation Age of Onset  . Cancer Mother   . Hypertension Mother   . Diabetes Mother   . Diabetes Sister   . Hypertension Sister    History  Substance Use Topics  . Smoking status: Current Some Day Smoker    Types: Cigarettes  . Smokeless tobacco: Never Used  . Alcohol Use: No   OB History   Grav Para Term Preterm Abortions TAB SAB Ect Mult Living   4 3 3  1           Review of Systems  Constitutional: Negative for fever, activity change and fatigue.  HENT: Negative.   Respiratory: Negative.   Cardiovascular: Negative.   Gastrointestinal: Negative.   Genitourinary: Positive for vaginal discharge. Negative for dysuria, frequency, flank pain, vaginal bleeding and pelvic pain.  Musculoskeletal: Positive for arthralgias.    Allergies  Review of patient's allergies indicates no known  allergies.  Home Medications   Current Outpatient Rx  Name  Route  Sig  Dispense  Refill  . acetaminophen-codeine (TYLENOL #3) 300-30 MG per tablet   Oral   Take 1-2 tablets by mouth every 6 (six) hours as needed for pain.   15 tablet   0   . ALPRAZolam (XANAX) 0.5 MG tablet   Oral   Take 0.5 mg by mouth 2 (two) times daily.           [name] Kitchen amLODipine (NORVASC) 10 MG tablet   Oral   Take 10 mg by mouth daily.           . fluconazole (DIFLUCAN) 150 MG tablet      1 tab po x 1. May repeat in 72 hours if no improvement   2 tablet   0   . hydrochlorothiazide (HYDRODIURIL) 25 MG tablet   Oral   Take 25 mg by mouth daily.           [name] Kitchen HYDROcodone-acetaminophen (NORCO) 5-325 MG per tablet   Oral   Take 1 tablet by mouth every 8 (eight) hours as needed for pain.   10 tablet   0   . HYDROcodone-acetaminophen (NORCO/VICODIN) 5-325 MG per tablet   Oral   Take 2 tablets by mouth every 4 (four) hours as needed for pain.         . indomethacin (INDOCIN) 50 MG capsule   Oral  Take 50 mg by mouth 2 (two) times daily with a meal.         . ketorolac (TORADOL) 10 MG tablet   Oral   Take 10 mg by mouth every 6 (six) hours as needed for pain.         [name] Kitchen lovastatin (MEVACOR) 20 MG tablet   Oral   Take 20 mg by mouth at bedtime.          BP 136/84  Pulse 85  Temp(Src) 98.3 F (36.8 C) (Oral)  Resp 16  SpO2 98% Physical Exam  Nursing note and vitals reviewed. Constitutional: She appears well-developed and well-nourished. No distress.  Neck: Normal range of motion. Neck supple.  Cardiovascular: Normal rate and normal heart sounds.   Pulmonary/Chest: Effort normal and breath sounds normal.  Genitourinary:  There is a small amount of white thick vaginal discharge along the walls of the vagina and in the vaginal vault and over the cervix. The cervix is pink without lesions and nulliparous. No bleeding. No CMT. No pelvic pain or tenderness.  Musculoskeletal: Normal  range of motion. She exhibits tenderness. She exhibits no edema.  Examination lower extremities reveal no asymmetry. There is no swelling, discoloration, increased warmth or other abnormalities observe or palpated. There is minor soreness/tenderness in palpating the great toe and the left tibial tuberosity. She is in respiratory with full weightbearing.  Neurological: She is alert. She exhibits normal muscle tone.  Skin: Skin is warm and dry. No erythema.  Psychiatric: She has a normal mood and affect.    ED Course  Procedures (including critical care time) Labs Reviewed  POCT URINALYSIS DIP (DEVICE)  CERVICOVAGINAL ANCILLARY ONLY   No results found. 1. Knee pain, left   2. Great toe pain, left   3. Candida vaginitis     MDM  I do not see any evidence of gouty arthritis. No apparent flareup. Uncertain whether she actually has a history of gout and she was told that her aches and pains in her body were probably caused by gout. Have obtained swabs for testing. Due to the history of vulvovaginal itching and scant white discharge will treat with Diflucan. For pain Tylenol #3 one to 2 every 6 hours when necessary pain #15. 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   Hayden Rasmussen, NP 03/27/13 1257

## 2013-03-27 NOTE — ED Notes (Signed)
Pt. C/o gout in her L knee and foot.  Pt states she has been out of her Vicodin for about a month.  Pt states that she has some discharge with itching but no burning.  Pt denies any fever N/V.   Pt says she has a history of irregular BM.  Pt is alert and oriented.  Leilani Able CMA student

## 2013-03-27 NOTE — ED Notes (Signed)
Patient made aware provider and CMA had an emergent patient and would be with her as soon as they could.  Patient did not have any needs at this time.

## 2013-03-30 NOTE — ED Notes (Signed)
After  Id  Verified     Lab  Results  Given to  Pt

## 2013-05-03 ENCOUNTER — Other Ambulatory Visit (HOSPITAL_COMMUNITY)
Admission: RE | Admit: 2013-05-03 | Discharge: 2013-05-03 | Disposition: A | Discharge status: Home / Self Care | Payer: Medicaid Other | Source: Ambulatory Visit | Attending: Family Medicine | Admitting: Family Medicine

## 2013-05-03 DIAGNOSIS — Z113 Encounter for screening for infections with a predominantly sexual mode of transmission: Secondary | ICD-10-CM | POA: Insufficient documentation

## 2013-05-03 DIAGNOSIS — N760 Acute vaginitis: Secondary | ICD-10-CM | POA: Insufficient documentation

## 2013-05-27 ENCOUNTER — Encounter (HOSPITAL_COMMUNITY): Payer: Self-pay | Admitting: *Deleted

## 2013-05-27 ENCOUNTER — Emergency Department (INDEPENDENT_AMBULATORY_CARE_PROVIDER_SITE_OTHER)
Admission: EM | Admit: 2013-05-27 | Discharge: 2013-05-27 | Disposition: A | Discharge status: Home / Self Care | Payer: Medicaid Other | Source: Home / Self Care | Attending: Family Medicine | Admitting: Family Medicine

## 2013-05-27 DIAGNOSIS — K644 Residual hemorrhoidal skin tags: Secondary | ICD-10-CM

## 2013-05-27 DIAGNOSIS — R21 Rash and other nonspecific skin eruption: Secondary | ICD-10-CM

## 2013-05-27 LAB — POCT URINALYSIS DIP (DEVICE)
Glucose, UA: NEGATIVE mg/dL
Nitrite: NEGATIVE
Urobilinogen, UA: 0.2 mg/dL (ref 0.0–1.0)

## 2013-05-27 MED ORDER — HYDROCORTISONE ACE-PRAMOXINE 2.35-1 % RE KIT: 1.0000 "application " | PACK | Freq: Two times a day (BID) | RECTAL | Status: DC | PRN

## 2013-05-27 MED ORDER — DOCUSATE SODIUM 100 MG PO CAPS
100.0000 mg | ORAL_CAPSULE | Freq: Two times a day (BID) | ORAL | Status: DC
Start: 2013-05-27 — End: 2014-05-17

## 2013-05-27 MED ORDER — CLOTRIMAZOLE-BETAMETHASONE 1-0.05 % EX CREA: TOPICAL_CREAM | CUTANEOUS | Status: DC

## 2013-05-27 NOTE — ED Provider Notes (Signed)
CSN: [id number]     Arrival date & time 05/27/13  1833 History   First MD Initiated Contact with Patient 05/27/13 1859     Chief Complaint  Patient presents with  . Urinary Tract Infection   (Consider location/radiation/quality/duration/timing/severity/associated sxs/prior Treatment) HPI Comments: 67 year old female presents complaining of dark vaginal discharge like "specks of dirt" and a small amount of blood on the toilet paper when wiping after urinating. This is been going on for weeks. She was seen at a different doctor's office but she says they are not taking care of her and have not called her back to tell her what is wrong. She says she has a history of chronic yeast infections but that this feels different. She also admits to a recent history of some nausea and subjective fever and chills. No abdominal pain, dysuria. She also says she has a rash that she wants to get checked out on both arms. She thinks it might be ringworm. The rash is itchy. It has been there for a couple of weeks now.  Patient is a 67 y.o. female presenting with urinary tract infection.  Urinary Tract Infection Pertinent negatives include no chest pain, no abdominal pain and no shortness of breath.    Past Medical History  Diagnosis Date  . Hypertension   . Gout   . Hyperlipidemia April 2014   Past Surgical History  Procedure Laterality Date  . Cesarean section    . Ovarian cyst surgery Right 1980's   Family History  Problem Relation Age of Onset  . Cancer Mother   . Hypertension Mother   . Diabetes Mother   . Diabetes Sister   . Hypertension Sister    History  Substance Use Topics  . Smoking status: Current Some Day Smoker    Types: Cigarettes  . Smokeless tobacco: Never Used  . Alcohol Use: No   OB History   Grav Para Term Preterm Abortions TAB SAB Ect Mult Living   4 3 3  1           Review of Systems  Constitutional: Negative for fever and chills.  Eyes: Negative for visual  disturbance.  Respiratory: Negative for cough and shortness of breath.   Cardiovascular: Negative for chest pain, palpitations and leg swelling.  Gastrointestinal: Positive for nausea. Negative for vomiting and abdominal pain.  Endocrine: Negative for polydipsia and polyuria.  Genitourinary: Positive for hematuria and vaginal discharge. Negative for dysuria, urgency and frequency.  Musculoskeletal: Negative for myalgias and arthralgias.  Skin: Negative for rash.  Neurological: Negative for dizziness, weakness and light-headedness.    Allergies  Review of patient's allergies indicates no known allergies.  Home Medications   Current Outpatient Rx  Name  Route  Sig  Dispense  Refill  . acetaminophen-codeine (TYLENOL #3) 300-30 MG per tablet   Oral   Take 1-2 tablets by mouth every 6 (six) hours as needed for pain.   15 tablet   0   . amLODipine (NORVASC) 10 MG tablet   Oral   Take 10 mg by mouth daily.           . hydrochlorothiazide (HYDRODIURIL) 25 MG tablet   Oral   Take 25 mg by mouth daily.           . indomethacin (INDOCIN) 50 MG capsule   Oral   Take 50 mg by mouth 2 (two) times daily with a meal.         . ALPRAZolam (XANAX) 0.5 MG tablet  Oral   Take 0.5 mg by mouth 2 (two) times daily.           . clotrimazole-betamethasone (LOTRISONE) cream      Apply to affected area 2 times daily prn   45 g   0   . docusate sodium (COLACE) 100 MG capsule   Oral   Take 1 capsule (100 mg total) by mouth every 12 (twelve) hours.   60 capsule   0   . fluconazole (DIFLUCAN) 150 MG tablet      1 tab po x 1. May repeat in 72 hours if no improvement   2 tablet   0   . HYDROcodone-acetaminophen (NORCO) 5-325 MG per tablet   Oral   Take 1 tablet by mouth every 8 (eight) hours as needed for pain.   10 tablet   0   . HYDROcodone-acetaminophen (NORCO/VICODIN) 5-325 MG per tablet   Oral   Take 2 tablets by mouth every 4 (four) hours as needed for pain.           [name] Kitchen Hydrocortisone Ace-Pramoxine 2.35-1 % KIT   Rectal   Place 1 application rectally 2 (two) times daily as needed.   1 kit   1   . ketorolac (TORADOL) 10 MG tablet   Oral   Take 10 mg by mouth every 6 (six) hours as needed for pain.         [name] Kitchen lovastatin (MEVACOR) 20 MG tablet   Oral   Take 20 mg by mouth at bedtime.          BP 128/78  Pulse 84  Temp(Src) 98.9 F (37.2 C) (Oral)  Resp 16  SpO2 98% Physical Exam  Nursing note and vitals reviewed. Constitutional: She is oriented to person, place, and time. Vital signs are normal. She appears well-developed and well-nourished. No distress.  HENT:  Head: Normocephalic and atraumatic.  Pulmonary/Chest: Effort normal. No respiratory distress.  Genitourinary: Vagina normal.    No vaginal discharge found.  Neurological: She is alert and oriented to person, place, and time. She has normal strength. Coordination normal.  Skin: Skin is warm and dry. Rash (Circumscribed, scaling, circular rash with central clearing on the left arm. Small erythematous itchy papules on the right dorsal forearm.) noted. She is not diaphoretic.  Psychiatric: She has a normal mood and affect. Judgment normal.    ED Course  Procedures (including critical care time) Labs Review Labs Reviewed  POCT URINALYSIS DIP (DEVICE) - Abnormal; Notable for the following:    Hgb urine dipstick TRACE (*)    All other components within normal limits  URINE CULTURE   Imaging Review No results found.  MDM   1. External hemorrhoids   2. Rash    There is no vaginal discharge, heart there are large inflamed external hemorrhoids. She may be feeling pressure from the hemorrhoids. We'll treat with a stool softener and hydrocortisone/pramoxine. Provide referral to [facility] surgery, if not improving she will call them for evaluation for hemorrhoidectomy. Lotrisone for the rash and arm. They're 2 separate rashes, 1 appears to be ringworm and what appears to be  insect bites. Lotrisone will cover both of these.   Meds ordered this encounter  Medications  . Hydrocortisone Ace-Pramoxine 2.35-1 % KIT    Sig: Place 1 application rectally 2 (two) times daily as needed.    Dispense:  1 kit    Refill:  1  . clotrimazole-betamethasone (LOTRISONE) cream    Sig: Apply to affected  area 2 times daily prn    Dispense:  45 g    Refill:  0  . docusate sodium (COLACE) 100 MG capsule    Sig: Take 1 capsule (100 mg total) by mouth every 12 (twelve) hours.    Dispense:  60 capsule    Refill:  0       Graylon Good, PA-C 05/27/13 1928

## 2013-05-27 NOTE — ED Notes (Signed)
Patient complains of dysuria with blood in urine, itching, and burning sensation while voiding; states that she has a rash on her arms that she wants examined.

## 2013-05-28 LAB — URINE CULTURE: Colony Count: NO GROWTH

## 2013-05-28 NOTE — ED Provider Notes (Signed)
Medical screening examination/treatment/procedure(s) were performed by a resident physician or non-physician practitioner and as the supervising physician I was immediately available for consultation/collaboration.  Clementeen Graham, MD    Rodolph Bong, MD 05/28/13 [id number]

## 2013-08-05 ENCOUNTER — Emergency Department (HOSPITAL_COMMUNITY)
Admission: EM | Admit: 2013-08-05 | Discharge: 2013-08-05 | Disposition: A | Discharge status: Home / Self Care | Payer: Medicare Other | Source: Home / Self Care | Attending: Emergency Medicine | Admitting: Emergency Medicine

## 2013-08-05 ENCOUNTER — Encounter (HOSPITAL_COMMUNITY): Payer: Self-pay | Admitting: Emergency Medicine

## 2013-08-05 ENCOUNTER — Other Ambulatory Visit (HOSPITAL_COMMUNITY)
Admission: RE | Admit: 2013-08-05 | Discharge: 2013-08-05 | Disposition: A | Discharge status: Home / Self Care | Payer: Medicare Other | Source: Ambulatory Visit | Attending: Emergency Medicine | Admitting: Emergency Medicine

## 2013-08-05 DIAGNOSIS — B9689 Other specified bacterial agents as the cause of diseases classified elsewhere: Secondary | ICD-10-CM

## 2013-08-05 DIAGNOSIS — Z113 Encounter for screening for infections with a predominantly sexual mode of transmission: Secondary | ICD-10-CM | POA: Insufficient documentation

## 2013-08-05 DIAGNOSIS — N760 Acute vaginitis: Secondary | ICD-10-CM

## 2013-08-05 DIAGNOSIS — M545 Low back pain: Secondary | ICD-10-CM

## 2013-08-05 DIAGNOSIS — A499 Bacterial infection, unspecified: Secondary | ICD-10-CM

## 2013-08-05 LAB — POCT PREGNANCY, URINE: Preg Test, Ur: NEGATIVE

## 2013-08-05 LAB — POCT URINALYSIS DIP (DEVICE)
Bilirubin Urine: NEGATIVE
Ketones, ur: NEGATIVE mg/dL
Protein, ur: NEGATIVE mg/dL
Specific Gravity, Urine: <=1.005 (ref 1.005–1.030)

## 2013-08-05 MED ORDER — BETAMETHASONE DIPROPIONATE AUG 0.05 % EX CREA: TOPICAL_CREAM | Freq: Two times a day (BID) | CUTANEOUS | Status: DC

## 2013-08-05 MED ORDER — CEPHALEXIN 500 MG PO CAPS: 500.0000 mg | ORAL_CAPSULE | Freq: Three times a day (TID) | ORAL | Status: DC

## 2013-08-05 MED ORDER — METRONIDAZOLE 500 MG PO TABS: 500.0000 mg | ORAL_TABLET | Freq: Two times a day (BID) | ORAL | Status: DC

## 2013-08-05 NOTE — ED Provider Notes (Signed)
Chief Complaint:   Chief Complaint  Patient presents with  . Back Pain  . Vaginal Discharge    History of Present Illness:   Mercedes Gardner is a 67 year old female who has had a one-week history of vaginal discharge with black specks. She denies any itching or odor. Her last menstrual period was years ago, she's not had any vaginal bleeding since that. She has a history of bacterial vaginosis, yeast infections, ovarian cyst, and urinary tract infections. She is not sexually active. The past 2 days she's had some back pain and urinary frequency. She denies any urgency or hematuria. She feels chilled and nauseated. She denies any pelvic pain. She's had no fever, chills, nausea, vomiting, or abdominal pain. Patient denies any pain with intercourse.  Review of Systems:  Other than noted above, the patient denies any of the following symptoms: Systemic:  No fever, chills, sweats, or weight loss. GI:  No abdominal pain, nausea, anorexia, vomiting, diarrhea, constipation, melena or hematochezia. GU:  No dysuria, frequency, urgency, hematuria, vaginal discharge, itching, or abnormal vaginal bleeding. Skin:  No rash or itching.  PMFSH:  Past medical history, family history, social history, meds, and allergies were reviewed.  She has high blood pressure, gout, and osteoarthritis of her knees. She takes amlodipine, hydrochlorothiazide, indomethacin, and lovastatin.  Physical Exam:   Vital signs:  BP 137/84  Pulse 81  Temp(Src) 98.3 F (36.8 C) (Oral)  Resp 18  SpO2 98% General:  Alert, oriented and in no distress. Lungs:  Breath sounds clear and equal bilaterally.  No wheezes, rales or rhonchi. Heart:  Regular rhythm.  No gallops or murmers. Abdomen:  Soft, flat and non-distended.  No organomegaly or mass.  No tenderness, guarding or rebound.  Bowel sounds normally active. Pelvic exam:  Normal external genitalia. Vaginal and cervical mucosa were normal. There was a scant amount of white,  homogeneous vaginal discharge. There was no odor. No bleeding. Uterus was mildly enlarged in size and nontender, suggesting possibility of fibroid tumors. No adnexal masses or tenderness. DNA probes for gonorrhea, Chlamydia, Trichomonas, Gardnerella, Candida were obtained. Skin:  She had numerous, dry, scaly patches on her hands, wrists, forearms..  Labs:   Results for orders placed during the hospital encounter of 08/05/13  POCT URINALYSIS DIP (DEVICE)      Result Value Range   Glucose, UA NEGATIVE  NEGATIVE mg/dL   Bilirubin Urine NEGATIVE  NEGATIVE   Ketones, ur NEGATIVE  NEGATIVE mg/dL   Specific Gravity, Urine <=1.005  1.005 - 1.030   Hgb urine dipstick TRACE (*) NEGATIVE   pH 5.0  5.0 - 8.0   Protein, ur NEGATIVE  NEGATIVE mg/dL   Urobilinogen, UA 0.2  0.0 - 1.0 mg/dL   Nitrite NEGATIVE  NEGATIVE   Leukocytes, UA TRACE (*) NEGATIVE  POCT PREGNANCY, URINE      Result Value Range   Preg Test, Ur NEGATIVE  NEGATIVE    A urine culture was obtained.  Assessment:  The primary encounter diagnosis was Bacterial vaginosis. A diagnosis of Low back pain was also pertinent to this visit.  Lower back pain may be due to urinary tract infection, or mechanical back pain. A culture is pending. We'll go ahead and treat with Keflex. Suggested that she followup with a gynecologist or primary care physician with regard to possible uterine fibroids.  Plan:   1.  Meds:  The following meds were prescribed:   Discharge Medication List as of 08/05/2013  6:06 PM    START  taking these medications   Details  augmented betamethasone dipropionate (DIPROLENE AF) 0.05 % cream Apply topically 2 (two) times daily., Starting 08/05/2013, Until Discontinued, Normal    cephALEXin (KEFLEX) 500 MG capsule Take 1 capsule (500 mg total) by mouth 3 (three) times daily., Starting 08/05/2013, Until Discontinued, Normal    metroNIDAZOLE (FLAGYL) 500 MG tablet Take 1 tablet (500 mg total) by mouth 2 (two) times daily.,  Starting 08/05/2013, Until Discontinued, Normal        2.  Patient Education/Counseling:  The patient was given appropriate handouts, self care instructions, and instructed in symptomatic relief.   3.  Follow up:  The patient was told to follow up if no better in 3 to 4 days, if becoming worse in any way, and given some red flag symptoms such as increasing pain, fever, or persistent vomiting which would prompt immediate return.  Follow up with primary care physician or gynecologist with regard to possible fibroids.     Reuben Likes, MD 08/05/13 2133

## 2013-08-05 NOTE — ED Notes (Signed)
Assessment per Dr. Keller. 

## 2013-08-06 ENCOUNTER — Telehealth (HOSPITAL_COMMUNITY): Payer: Self-pay | Admitting: Emergency Medicine

## 2013-08-06 LAB — URINE CULTURE: Colony Count: 15000

## 2013-08-06 MED ORDER — FLUCONAZOLE 150 MG PO TABS: 150.0000 mg | ORAL_TABLET | Freq: Once | ORAL | Status: DC

## 2013-08-06 NOTE — ED Notes (Signed)
Her DNA probe came back positive for candida negative for everything else. She was treated with metronidazole, so we'll need to add Diflucan 150 mg #1, one time only. We'll send this to her pharmacy and informed patient of this result.  Reuben Likes, MD 08/06/13 [id number]

## 2013-08-07 ENCOUNTER — Telehealth (HOSPITAL_COMMUNITY): Payer: Self-pay | Admitting: *Deleted

## 2013-08-07 NOTE — ED Notes (Signed)
GC/Chlamydia neg., Affirm: Candida pos., Gardnerella and Trich neg., Urine culture: Multiple bacterial types none predominant. Dr. Lorenz Coaster e-prescribed Flagyl to [facility] on [address]  Pt. called for her lab results.  Pt. verified x 2 and given results. Pt. told she has a Rx. for Diflucan for the yeast infection at her pharmacy.   Mercedes Gardner 08/07/2013

## 2013-08-19 ENCOUNTER — Emergency Department (HOSPITAL_COMMUNITY)
Admission: EM | Admit: 2013-08-19 | Discharge: 2013-08-19 | Disposition: A | Discharge status: Home / Self Care | Payer: Medicare Other | Source: Home / Self Care

## 2013-08-19 ENCOUNTER — Encounter (HOSPITAL_COMMUNITY): Payer: Self-pay | Admitting: Emergency Medicine

## 2013-08-19 DIAGNOSIS — M549 Dorsalgia, unspecified: Secondary | ICD-10-CM

## 2013-08-19 DIAGNOSIS — B373 Candidiasis of vulva and vagina: Secondary | ICD-10-CM

## 2013-08-19 DIAGNOSIS — B3731 Acute candidiasis of vulva and vagina: Secondary | ICD-10-CM

## 2013-08-19 HISTORY — DX: Anxiety disorder, unspecified: F41.9

## 2013-08-19 LAB — POCT URINALYSIS DIP (DEVICE)
Glucose, UA: NEGATIVE mg/dL
Nitrite: NEGATIVE
Protein, ur: NEGATIVE mg/dL
Urobilinogen, UA: 0.2 mg/dL (ref 0.0–1.0)
pH: 5.5 (ref 5.0–8.0)

## 2013-08-19 MED ORDER — DICLOFENAC POTASSIUM 50 MG PO TABS: 50.0000 mg | ORAL_TABLET | Freq: Three times a day (TID) | ORAL | Status: DC

## 2013-08-19 MED ORDER — FLUCONAZOLE 150 MG PO TABS: ORAL_TABLET | ORAL | Status: DC

## 2013-08-19 NOTE — ED Notes (Signed)
Pt was seen 11/23 - for vaginal discharge and back pain.  Pt ended up being treated with Flagyl and one dose of Diflucan.  Pt states she is still having left lower back pain into buttock - wants to make sure she doesn't have UTI.  Denies any urinary sxs.  Also c/o vaginal discharge - feels she has a yeast infection again.  Pt is not sexually active.

## 2013-08-19 NOTE — ED Provider Notes (Signed)
Medical screening examination/treatment/procedure(s) were performed by resident physician or non-physician practitioner and as supervising physician I was immediately available for consultation/collaboration.   Lamine Laton DOUGLAS MD.   Xiamara Hulet D Mikeal Winstanley, MD 08/19/13 1409 

## 2013-08-19 NOTE — ED Provider Notes (Signed)
CSN: [id number]     Arrival date & time 08/19/13  1129 History   First MD Initiated Contact with Patient 08/19/13 1209     Chief Complaint  Patient presents with  . Back Pain  . Vaginitis   (Consider location/radiation/quality/duration/timing/severity/associated sxs/prior Treatment) HPI Comments: 67 year old female seen in the urgent care 2 weeks ago for left low back pain and vaginal discharge. She was treated for BV and candidiasis. She states that her back pain is a little better however persisted. She also has a vaginal discharge as 2 weeks ago but is less. Urine culture results were negative for a predominant bacteria. Cervical ancillary cytology positive for candida only. She has taken all of her medications.   Past Medical History  Diagnosis Date  . Hypertension   . Gout   . Hyperlipidemia April 2014  . Anxiety    Past Surgical History  Procedure Laterality Date  . Cesarean section      x3  . Ovarian cyst surgery Right 1980's   Family History  Problem Relation Age of Onset  . Cancer Mother   . Hypertension Mother   . Diabetes Mother   . Diabetes Sister   . Hypertension Sister    History  Substance Use Topics  . Smoking status: Current Some Day Smoker    Types: Cigarettes  . Smokeless tobacco: Never Used  . Alcohol Use: No   OB History   Grav Para Term Preterm Abortions TAB SAB Ect Mult Living   4 3 3  1           Review of Systems  Constitutional: Negative.   HENT: Negative.   Respiratory: Negative.   Cardiovascular: Negative.   Gastrointestinal: Negative.   Genitourinary: Positive for vaginal discharge. Negative for dysuria, hematuria, vaginal pain, menstrual problem and pelvic pain.  Musculoskeletal: Positive for back pain.  Skin: Negative.     Allergies  Review of patient's allergies indicates no known allergies.  Home Medications   Current Outpatient Rx  Name  Route  Sig  Dispense  Refill  . amLODipine (NORVASC) 10 MG tablet   Oral   Take 10  mg by mouth daily.           [name] Kitchen augmented betamethasone dipropionate (DIPROLENE AF) 0.05 % cream   Topical   Apply topically 2 (two) times daily.   50 g   5   . clotrimazole-betamethasone (LOTRISONE) cream      Apply to affected area 2 times daily prn   45 g   0   . docusate sodium (COLACE) 100 MG capsule   Oral   Take 1 capsule (100 mg total) by mouth every 12 (twelve) hours.   60 capsule   0   . hydrochlorothiazide (HYDRODIURIL) 25 MG tablet   Oral   Take 25 mg by mouth daily.           . indomethacin (INDOCIN) 50 MG capsule   Oral   Take 50 mg by mouth 2 (two) times daily with a meal.         . diclofenac (CATAFLAM) 50 MG tablet   Oral   Take 1 tablet (50 mg total) by mouth 3 (three) times daily. Prn back pain.   21 tablet   0   . fluconazole (DIFLUCAN) 150 MG tablet      1 tab po every other day x 3 doses   3 tablet   0    BP 120/88  Pulse 96  Temp(Src) 98.6  F (37 C) (Oral)  Resp 18  SpO2 99% Physical Exam  Nursing note and vitals reviewed. Constitutional: She is oriented to person, place, and time. She appears well-nourished. No distress.  Eyes: Conjunctivae and EOM are normal.  Neck: Normal range of motion. Neck supple.  Cardiovascular: Normal rate and regular rhythm.   Pulmonary/Chest: Effort normal and breath sounds normal.  Musculoskeletal: She exhibits no edema and no tenderness.  Palpation of the lower back and in particular the left para lumbosacral musculature is negative. Unable to reproduce the pain that she states comes and goes in that area. It is not affected by movement or position.  Neurological: She is alert and oriented to person, place, and time. She exhibits normal muscle tone.  Skin: Skin is warm and dry.  Psychiatric: She has a normal mood and affect.    ED Course  Procedures (including critical care time) Labs Review Labs Reviewed  POCT URINALYSIS DIP (DEVICE) - Abnormal; Notable for the following:    Hgb urine  dipstick TRACE (*)    All other components within normal limits   Imaging Review No results found.  Results for orders placed during the hospital encounter of 08/19/13  POCT URINALYSIS DIP (DEVICE)      Result Value Range   Glucose, UA NEGATIVE  NEGATIVE mg/dL   Bilirubin Urine NEGATIVE  NEGATIVE   Ketones, ur NEGATIVE  NEGATIVE mg/dL   Specific Gravity, Urine 1.020  1.005 - 1.030   Hgb urine dipstick TRACE (*) NEGATIVE   pH 5.5  5.0 - 8.0   Protein, ur NEGATIVE  NEGATIVE mg/dL   Urobilinogen, UA 0.2  0.0 - 1.0 mg/dL   Nitrite NEGATIVE  NEGATIVE   Leukocytes, UA NEGATIVE  NEGATIVE       MDM   1. Back pain   2. Candida vaginitis    L paralumbosacral back pain of uncertain etio.  cataflam 50 tid prn back pain #21 Diflucan 150 mg 1 q o d. X 3 doses. Keep appointment with PCP Tuesday (2 d). Hold your cholesterol medication for 6 days.   Hayden Rasmussen, NP 08/19/13 1252

## 2013-11-04 ENCOUNTER — Encounter (HOSPITAL_COMMUNITY): Payer: Self-pay | Admitting: Emergency Medicine

## 2013-11-04 ENCOUNTER — Emergency Department (INDEPENDENT_AMBULATORY_CARE_PROVIDER_SITE_OTHER)
Admission: EM | Admit: 2013-11-04 | Discharge: 2013-11-04 | Disposition: A | Discharge status: Home / Self Care | Payer: Medicare Other | Source: Home / Self Care

## 2013-11-04 DIAGNOSIS — L253 Unspecified contact dermatitis due to other chemical products: Secondary | ICD-10-CM

## 2013-11-04 DIAGNOSIS — B3731 Acute candidiasis of vulva and vagina: Secondary | ICD-10-CM

## 2013-11-04 DIAGNOSIS — B373 Candidiasis of vulva and vagina: Secondary | ICD-10-CM

## 2013-11-04 DIAGNOSIS — L231 Allergic contact dermatitis due to adhesives: Secondary | ICD-10-CM

## 2013-11-04 MED ORDER — FLUCONAZOLE 150 MG PO TABS: ORAL_TABLET | ORAL | Status: DC

## 2013-11-04 NOTE — Discharge Instructions (Signed)
Candidal Vulvovaginitis Candidal vulvovaginitis is an infection of the vagina and vulva. The vulva is the skin around the opening of the vagina. This may cause itching and discomfort in and around the vagina.  HOME CARE  Only take medicine as told by your doctor.  Do not have sex (intercourse) until the infection is healed or as told by your doctor.  Practice safe sex.  Tell your sex partner about your infection.  Do not douche or use tampons.  Wear cotton underwear. Do not wear tight pants or panty hose.  Eat yogurt. This may help treat and prevent yeast infections. GET HELP RIGHT AWAY IF:   You have a fever.  Your problems get worse during treatment or do not get better in 3 days.  You have discomfort, irritation, or itching in your vagina or vulva area.  You have pain after sex.  You start to get belly (abdominal) pain. MAKE SURE YOU:  Understand these instructions.  Will watch your condition.  Will get help right away if you are not doing well or get worse. Document Released: 11/26/2008 Document Revised: 11/22/2011 Document Reviewed: 11/26/2008 [facility] Patient Information 2014 [place], [place].  Contact Dermatitis Contact dermatitis is a rash that happens when something touches the skin. You touched something that irritates your skin, or you have allergies to something you touched. HOME CARE   Avoid the thing that caused your rash.  Keep your rash away from hot water, soap, sunlight, chemicals, and other things that might bother it.  Do not scratch your rash.  You can take cool baths to help stop itching.  Only take medicine as told by your doctor.  Keep all doctor visits as told. GET HELP RIGHT AWAY IF:   Your rash is not better after 3 days.  Your rash gets worse.  Your rash is puffy (swollen), tender, red, sore, or warm.  You have problems with your medicine. MAKE SURE YOU:   Understand these instructions.  Will watch your condition.  Will  get help right away if you are not doing well or get worse. Document Released: 06/27/2009 Document Revised: 11/22/2011 Document Reviewed: 02/02/2011 [facility] Patient Information 2014 [place], [place].

## 2013-11-04 NOTE — ED Provider Notes (Signed)
CSN: [id number]     Arrival date & time 11/04/13  1225 History   First MD Initiated Contact with Patient 11/04/13 1351     Chief Complaint  Patient presents with  . Vaginal Itching     (Consider location/radiation/quality/duration/timing/severity/associated sxs/prior Treatment) HPI Comments: 68 year old female presents with 2 separate complaints. #1. She is complaining of swelling and pain in the right second third digits. She has been placing artificial nails on her fingers in the nails got caught on something and broke off. He states he took part of the nail off. She is complaining of mild swelling to the pulp of the fingers.  The second complaint is that of vaginal discharge and itching. She has a history of recurrent vaginal candidiasis, she recently took an old prescription of amoxicillin thinking that" her hands were going to fall off due to infection." It was 24 hours later when she developed the vaginal symptoms.  Patient is a 68 y.o. female presenting with vaginal itching.  Vaginal Itching    Past Medical History  Diagnosis Date  . Hypertension   . Gout   . Hyperlipidemia April 2014  . Anxiety    Past Surgical History  Procedure Laterality Date  . Cesarean section      x3  . Ovarian cyst surgery Right 1980's   Family History  Problem Relation Age of Onset  . Cancer Mother   . Hypertension Mother   . Diabetes Mother   . Diabetes Sister   . Hypertension Sister    History  Substance Use Topics  . Smoking status: Current Some Day Smoker    Types: Cigarettes  . Smokeless tobacco: Never Used  . Alcohol Use: No   OB History   Grav Para Term Preterm Abortions TAB SAB Ect Mult Living   4 3 3  1           Review of Systems  Constitutional: Negative.   HENT: Negative.   Respiratory: Negative.   Gastrointestinal: Negative.   Genitourinary: Positive for vaginal discharge. Negative for dysuria, frequency, flank pain, genital sores and pelvic pain.   Musculoskeletal: Negative.   Neurological: Negative.   Psychiatric/Behavioral: Positive for agitation. The patient is nervous/anxious.       Allergies  Review of patient's allergies indicates no known allergies.  Home Medications   Current Outpatient Rx  Name  Route  Sig  Dispense  Refill  . amLODipine (NORVASC) 10 MG tablet   Oral   Take 10 mg by mouth daily.           [name] Kitchen augmented betamethasone dipropionate (DIPROLENE AF) 0.05 % cream   Topical   Apply topically 2 (two) times daily.   50 g   5   . clotrimazole-betamethasone (LOTRISONE) cream      Apply to affected area 2 times daily prn   45 g   0   . diclofenac (CATAFLAM) 50 MG tablet   Oral   Take 1 tablet (50 mg total) by mouth 3 (three) times daily. Prn back pain.   21 tablet   0   . docusate sodium (COLACE) 100 MG capsule   Oral   Take 1 capsule (100 mg total) by mouth every 12 (twelve) hours.   60 capsule   0   . fluconazole (DIFLUCAN) 150 MG tablet      1 tab po every other day x 3 doses   3 tablet   0   . fluconazole (DIFLUCAN) 150 MG tablet  1 tab po x 1. May repeat in 72 hours if no improvement   2 tablet   0   . hydrochlorothiazide (HYDRODIURIL) 25 MG tablet   Oral   Take 25 mg by mouth daily.            BP 127/79  Pulse 89  Temp(Src) 98 F (36.7 C) (Oral)  Resp 18  SpO2 100% Physical Exam  Nursing note and vitals reviewed. Constitutional: She is oriented to person, place, and time. She appears well-developed and well-nourished. No distress.  Neck: Normal range of motion. Neck supple.  Pulmonary/Chest: Effort normal. No respiratory distress.  Genitourinary:  Patient refused to have a pelvic exam.  Neurological: She is alert and oriented to person, place, and time.  Skin: Skin is warm and dry.  Am unable to appreciate edema or swelling to the right hand or digits. There is minor, superficial erythema which appears to be irritation from the lesion of the artificial nails.  There is no erythema suggestive of infection. There is no drainage from any source. There is no nail discoloration. There is no paronychia. Fingers with full range of motion and brisk capillary refill.  Psychiatric: Her mood appears anxious. Her speech is rapid and/or pressured. Thought content is paranoid.    ED Course  Procedures (including critical care time) Labs Review Labs Reviewed - No data to display Imaging Review No results found.    MDM   Final diagnoses:  Contact dermatitis due to adhesives  Vaginal candidiasis    Patient appears to have a mild reaction to the the adhesive used to apply  the artificial nails. May use over the counter cortisone to help with itching. Diflucan 50 mg now and repeat in 2 days when necessary. Followup with your PCP Patient requested to have her digits sliced open to obtain a blood sample for infection however I declined to perform this unnecessary procedure.    Janne Napoleon, NP 11/04/13 1435

## 2013-11-04 NOTE — ED Notes (Signed)
Pt  Actually  Has  2  Symptoms   - she  Has   vaginal irritation  From  Taking  Anti  Biotics  Witch   She  Thinks  Is  A  Yeast  Infection          sShe  Reports  Pain r    3 4 5   Fingers  Of r  Hand     X  3  Days  Which  She  Attributes  To  False  Nails

## 2013-11-06 NOTE — ED Provider Notes (Signed)
Medical screening examination/treatment/procedure(s) were performed by a resident physician or non-physician practitioner and as the supervising physician I was immediately available for consultation/collaboration.  Lynne Leader, MD    Gregor Hams, MD 11/06/13 [phone]

## 2014-02-14 ENCOUNTER — Encounter (HOSPITAL_COMMUNITY): Payer: Self-pay | Admitting: Emergency Medicine

## 2014-02-14 ENCOUNTER — Other Ambulatory Visit (HOSPITAL_COMMUNITY)
Admission: RE | Admit: 2014-02-14 | Discharge: 2014-02-14 | Disposition: A | Discharge status: Home / Self Care | Payer: Medicare Other | Source: Ambulatory Visit | Attending: Emergency Medicine | Admitting: Emergency Medicine

## 2014-02-14 ENCOUNTER — Emergency Department (HOSPITAL_COMMUNITY)
Admission: EM | Admit: 2014-02-14 | Discharge: 2014-02-14 | Disposition: A | Discharge status: Home / Self Care | Payer: Medicare Other | Source: Home / Self Care | Attending: Emergency Medicine | Admitting: Emergency Medicine

## 2014-02-14 DIAGNOSIS — Z113 Encounter for screening for infections with a predominantly sexual mode of transmission: Secondary | ICD-10-CM | POA: Insufficient documentation

## 2014-02-14 DIAGNOSIS — N760 Acute vaginitis: Secondary | ICD-10-CM

## 2014-02-14 LAB — POCT URINALYSIS DIP (DEVICE)
BILIRUBIN URINE: NEGATIVE
GLUCOSE, UA: NEGATIVE mg/dL
Hgb urine dipstick: NEGATIVE
KETONES UR: NEGATIVE mg/dL
Leukocytes, UA: NEGATIVE
Nitrite: NEGATIVE
Protein, ur: NEGATIVE mg/dL
Urobilinogen, UA: 0.2 mg/dL (ref 0.0–1.0)
pH: 5.5 (ref 5.0–8.0)

## 2014-02-14 MED ORDER — FLUCONAZOLE 150 MG PO TABS: 150.0000 mg | ORAL_TABLET | Freq: Once | ORAL | Status: DC

## 2014-02-14 MED ORDER — METRONIDAZOLE 500 MG PO TABS: 500.0000 mg | ORAL_TABLET | Freq: Two times a day (BID) | ORAL | Status: DC

## 2014-02-14 NOTE — Discharge Instructions (Signed)
Bacterial Vaginosis Bacterial vaginosis is a vaginal infection that occurs when the normal balance of bacteria in the vagina is disrupted. It results from an overgrowth of certain bacteria. This is the most common vaginal infection in women of childbearing age. Treatment is important to prevent complications, especially in pregnant women, as it can cause a premature delivery. CAUSES  Bacterial vaginosis is caused by an increase in harmful bacteria that are normally present in smaller amounts in the vagina. Several different kinds of bacteria can cause bacterial vaginosis. However, the reason that the condition develops is not fully understood. RISK FACTORS Certain activities or behaviors can put you at an increased risk of developing bacterial vaginosis, including:  Having a new sex partner or multiple sex partners.  Douching.  Using an intrauterine device (IUD) for contraception. Women do not get bacterial vaginosis from toilet seats, bedding, swimming pools, or contact with objects around them. SIGNS AND SYMPTOMS  Some women with bacterial vaginosis have no signs or symptoms. Common symptoms include:  Grey vaginal discharge.  A fishlike odor with discharge, especially after sexual intercourse.  Itching or burning of the vagina and vulva.  Burning or pain with urination. DIAGNOSIS  Your health care provider will take a medical history and examine the vagina for signs of bacterial vaginosis. A sample of vaginal fluid may be taken. Your health care provider will look at this sample under a microscope to check for bacteria and abnormal cells. A vaginal pH test may also be done.  TREATMENT  Bacterial vaginosis may be treated with antibiotic medicines. These may be given in the form of a pill or a vaginal cream. A second round of antibiotics may be prescribed if the condition comes back after treatment.  HOME CARE INSTRUCTIONS   Only take over-the-counter or prescription medicines as  directed by your health care provider.  If antibiotic medicine was prescribed, take it as directed. Make sure you finish it even if you start to feel better.  Do not have sex until treatment is completed.  Tell all sexual partners that you have a vaginal infection. They should see their health care provider and be treated if they have problems, such as a mild rash or itching.  Practice safe sex by using condoms and only having one sex partner. SEEK MEDICAL CARE IF:   Your symptoms are not improving after 3 days of treatment.  You have increased discharge or pain.  You have a fever. MAKE SURE YOU:   Understand these instructions.  Will watch your condition.  Will get help right away if you are not doing well or get worse. FOR MORE INFORMATION  Centers for Disease Control and Prevention, Division of STD Prevention: [employer] American Sexual Health Association (ASHA): www.ashastd.org  Document Released: 08/30/2005 Document Revised: 06/20/2013 Document Reviewed: 04/11/2013 [facility] Patient Information 2014 [place]. Candidal Vulvovaginitis Candidal vulvovaginitis is an infection of the vagina and vulva. The vulva is the skin around the opening of the vagina. This may cause itching and discomfort in and around the vagina.  HOME CARE  Only take medicine as told by your doctor.  Do not have sex (intercourse) until the infection is healed or as told by your doctor.  Practice safe sex.  Tell your sex partner about your infection.  Do not douche or use tampons.  Wear cotton underwear. Do not wear tight pants or panty hose.  Eat yogurt. This may help treat and prevent yeast infections. GET HELP RIGHT AWAY IF:   You have  a fever.  Your problems get worse during treatment or do not get better in 3 days.  You have discomfort, irritation, or itching in your vagina or vulva area.  You have pain after sex.  You start to get belly (abdominal) pain. MAKE SURE  YOU:  Understand these instructions.  Will watch your condition.  Will get help right away if you are not doing well or get worse. Document Released: 11/26/2008 Document Revised: 11/22/2011 Document Reviewed: 11/26/2008 [facility] Patient Information 2014 [place], [place].

## 2014-02-14 NOTE — ED Notes (Signed)
Pt  Reports  Symptoms  Of   A  uti      As  Well  As  A  Slight  Vaginal  Discharge            She  States  She  Is  Prone   To  Getting yeast  Infections       she   Ambulated  To n room  With a  Steady  Fluid  Gait        And  Is  Sitting  Upright on the  Exam table  Speaking in  Complete   sentances

## 2014-02-14 NOTE — ED Provider Notes (Signed)
Chief Complaint   Chief Complaint  Patient presents with  . Urinary Frequency    History of Present Illness   Mercedes Gardner is a 68 year old female who presents today with a one-week history of vaginal itching, irritation, and discharge. She denies any odor or abnormal bleeding. She's had no fever, chills, nausea, or vomiting. She denies any pelvic or lower back pain. No urinary symptoms. She has had recurring yeast infections in the past.  Review of Systems   Other than as noted above, the patient denies any of the following symptoms: Systemic:  No fever or chills GI:  No abdominal pain, nausea, vomiting, diarrhea, constipation, melena or hematochezia. GU:  No dysuria, frequency, urgency, hematuria, vaginal discharge, itching, or abnormal vaginal bleeding.  [place]   Past medical history, family history, social history, meds, and allergies were reviewed.    Physical Examination    Vital signs:  BP 118/62  Pulse 98  Temp(Src) 98.1 F (36.7 C) (Oral)  Resp 20  SpO2 97% General:  Alert, oriented and in no distress. Lungs:  Breath sounds clear and equal bilaterally.  No wheezes, rales or rhonchi. Heart:  Regular rhythm.  No gallops or murmers. Abdomen:  Soft, flat and non-distended.  No organomegaly or mass.  No tenderness, guarding or rebound.  Bowel sounds normally active. Pelvic exam:  Normal external genitalia, vaginal and cervical mucosa were normal. There is a scant amount of white discharge which was slightly malodorous. Cervix appeared normal. No pain on cervical motion. Uterus was enlarged and irregular suggesting fibroid tumors. No adnexal masses or tenderness.  DNA probes for gonorrhea, Chlamydia, Trichomonas, Gardnerella, Candida were obtained. Skin:  Clear, warm and dry.  Chaperoned by Mrs. Mickeal Needy, EMT who was present throughout the pelvic exam.   Labs   Results for orders placed during the hospital encounter of 02/14/14  POCT URINALYSIS DIP (DEVICE)       Result Value Ref Range   Glucose, UA NEGATIVE  NEGATIVE mg/dL   Bilirubin Urine NEGATIVE  NEGATIVE   Ketones, ur NEGATIVE  NEGATIVE mg/dL   Specific Gravity, Urine <=1.005  1.005 - 1.030   Hgb urine dipstick NEGATIVE  NEGATIVE   pH 5.5  5.0 - 8.0   Protein, ur NEGATIVE  NEGATIVE mg/dL   Urobilinogen, UA 0.2  0.0 - 1.0 mg/dL   Nitrite NEGATIVE  NEGATIVE   Leukocytes, UA NEGATIVE  NEGATIVE    Assessment   The encounter diagnosis was Vaginitis.  Differential diagnosis is Candida versus bacterial vaginosis.       Plan    1.  Meds:  The following meds were prescribed:   Discharge Medication List as of 02/14/2014  1:25 PM    START taking these medications   Details  !! fluconazole (DIFLUCAN) 150 MG tablet Take 1 tablet (150 mg total) by mouth once., Starting 02/14/2014, Normal    metroNIDAZOLE (FLAGYL) 500 MG tablet Take 1 tablet (500 mg total) by mouth 2 (two) times daily., Starting 02/14/2014, Until Discontinued, Normal     !! - Potential duplicate medications found. Please discuss with provider.      2.  Patient Education/Counseling:  The patient was given appropriate handouts, self care instructions, and instructed in symptomatic relief.    3.  Follow up:  The patient was told to follow up here if no better in 3 to 4 days, or sooner if becoming worse in any way, and given some red flag symptoms such as worsening pain, fever, persistent vomiting, or heavy  vaginal bleeding which would prompt immediate return.       Harden Mo, MD 02/14/14 [phone]

## 2014-02-15 NOTE — Progress Notes (Signed)
Quick Note:  Results are abnormal as noted, but have been adequately treated. No further action necessary. ______ 

## 2014-02-19 NOTE — ED Notes (Signed)
GC/Chlamydia neg., Affirm: Candida pos., Gardnerella and Trich neg.  Pt. adequately treated with Diflucan. Hanley Seamen Roshad Hack 02/19/2014

## 2014-03-06 ENCOUNTER — Other Ambulatory Visit (HOSPITAL_COMMUNITY)
Admission: RE | Admit: 2014-03-06 | Discharge: 2014-03-06 | Disposition: A | Discharge status: Home / Self Care | Payer: Medicare Other | Source: Ambulatory Visit | Attending: Family Medicine | Admitting: Family Medicine

## 2014-03-06 ENCOUNTER — Encounter (HOSPITAL_COMMUNITY): Payer: Self-pay | Admitting: Emergency Medicine

## 2014-03-06 ENCOUNTER — Emergency Department (INDEPENDENT_AMBULATORY_CARE_PROVIDER_SITE_OTHER)
Admission: EM | Admit: 2014-03-06 | Discharge: 2014-03-06 | Disposition: A | Discharge status: Home / Self Care | Payer: Medicare Other | Source: Home / Self Care | Attending: Family Medicine | Admitting: Family Medicine

## 2014-03-06 DIAGNOSIS — N760 Acute vaginitis: Secondary | ICD-10-CM

## 2014-03-06 DIAGNOSIS — M109 Gout, unspecified: Secondary | ICD-10-CM

## 2014-03-06 DIAGNOSIS — Z113 Encounter for screening for infections with a predominantly sexual mode of transmission: Secondary | ICD-10-CM | POA: Insufficient documentation

## 2014-03-06 MED ORDER — FLUCONAZOLE 150 MG PO TABS: 150.0000 mg | ORAL_TABLET | ORAL | Status: DC

## 2014-03-06 MED ORDER — HYDROCODONE-ACETAMINOPHEN 5-325 MG PO TABS: 1.0000 | ORAL_TABLET | Freq: Four times a day (QID) | ORAL | Status: DC | PRN

## 2014-03-06 MED ORDER — COLCHICINE 0.6 MG PO TABS: 0.6000 mg | ORAL_TABLET | Freq: Every day | ORAL | Status: DC

## 2014-03-06 NOTE — ED Notes (Signed)
C/o flare of gout in the left foot on set 3 days ago.  Not having relief with prescribed meds.

## 2014-03-06 NOTE — Discharge Instructions (Signed)
Thank you for coming in today. Call or go to the emergency room if you get worse, have trouble breathing, have chest pains, or palpitations.   Gout Gout is an inflammatory arthritis caused by a buildup of uric acid crystals in the joints. Uric acid is a chemical that is normally present in the blood. When the level of uric acid in the blood is too high it can form crystals that deposit in your joints and tissues. This causes joint redness, soreness, and swelling (inflammation). Repeat attacks are common. Over time, uric acid crystals can form into masses (tophi) near a joint, destroying bone and causing disfigurement. Gout is treatable and often preventable. CAUSES  The disease begins with elevated levels of uric acid in the blood. Uric acid is produced by your body when it breaks down a naturally found substance called purines. Certain foods you eat, such as meats and fish, contain high amounts of purines. Causes of an elevated uric acid level include:  Being passed down from parent to child (heredity).  Diseases that cause increased uric acid production (such as obesity, psoriasis, and certain cancers).  Excessive alcohol use.  Diet, especially diets rich in meat and seafood.  Medicines, including certain cancer-fighting medicines (chemotherapy), water pills (diuretics), and aspirin.  Chronic kidney disease. The kidneys are no longer able to remove uric acid well.  Problems with metabolism. Conditions strongly associated with gout include:  Obesity.  High blood pressure.  High cholesterol.  Diabetes. Not everyone with elevated uric acid levels gets gout. It is not understood why some people get gout and others do not. Surgery, joint injury, and eating too much of certain foods are some of the factors that can lead to gout attacks. SYMPTOMS   An attack of gout comes on quickly. It causes intense pain with redness, swelling, and warmth in a joint.  Fever can occur.  Often, only  one joint is involved. Certain joints are more commonly involved:  Base of the big toe.  Knee.  Ankle.  Wrist.  Finger. Without treatment, an attack usually goes away in a few days to weeks. Between attacks, you usually will not have symptoms, which is different from many other forms of arthritis. DIAGNOSIS  Your caregiver will suspect gout based on your symptoms and exam. In some cases, tests may be recommended. The tests may include:  Blood tests.  Urine tests.  X-rays.  Joint fluid exam. This exam requires a needle to remove fluid from the joint (arthrocentesis). Using a microscope, gout is confirmed when uric acid crystals are seen in the joint fluid. TREATMENT  There are two phases to gout treatment: treating the sudden onset (acute) attack and preventing attacks (prophylaxis).  Treatment of an Acute Attack.  Medicines are used. These include anti-inflammatory medicines or steroid medicines.  An injection of steroid medicine into the affected joint is sometimes necessary.  The painful joint is rested. Movement can worsen the arthritis.  You may use warm or cold treatments on painful joints, depending which works best for you.  Treatment to Prevent Attacks.  If you suffer from frequent gout attacks, your caregiver may advise preventive medicine. These medicines are started after the acute attack subsides. These medicines either help your kidneys eliminate uric acid from your body or decrease your uric acid production. You may need to stay on these medicines for a very long time.  The early phase of treatment with preventive medicine can be associated with an increase in acute gout attacks. For  this reason, during the first few months of treatment, your caregiver may also advise you to take medicines usually used for acute gout treatment. Be sure you understand your caregiver's directions. Your caregiver may make several adjustments to your medicine dose before these  medicines are effective.  Discuss dietary treatment with your caregiver or dietitian. Alcohol and drinks high in sugar and fructose and foods such as meat, poultry, and seafood can increase uric acid levels. Your caregiver or dietician can advise you on drinks and foods that should be limited. HOME CARE INSTRUCTIONS   Do not take aspirin to relieve pain. This raises uric acid levels.  Only take over-the-counter or prescription medicines for pain, discomfort, or fever as directed by your caregiver.  Rest the joint as much as possible. When in bed, keep sheets and blankets off painful areas.  Keep the affected joint raised (elevated).  Apply warm or cold treatments to painful joints. Use of warm or cold treatments depends on which works best for you.  Use crutches if the painful joint is in your leg.  Drink enough fluids to keep your urine clear or pale yellow. This helps your body get rid of uric acid. Limit alcohol, sugary drinks, and fructose drinks.  Follow your dietary instructions. Pay careful attention to the amount of protein you eat. Your daily diet should emphasize fruits, vegetables, whole grains, and fat-free or low-fat milk products. Discuss the use of coffee, vitamin C, and cherries with your caregiver or dietician. These may be helpful in lowering uric acid levels.  Maintain a healthy body weight. SEEK MEDICAL CARE IF:   You develop diarrhea, vomiting, or any side effects from medicines.  You do not feel better in 24 hours, or you are getting worse. SEEK IMMEDIATE MEDICAL CARE IF:   Your joint becomes suddenly more tender, and you have chills or a fever. MAKE SURE YOU:   Understand these instructions.  Will watch your condition.  Will get help right away if you are not doing well or get worse. Document Released: 08/27/2000 Document Revised: 12/25/2012 Document Reviewed: 04/12/2012 [facility] Patient Information 2015 [place], [place]. This information is not intended to  replace advice given to you by your health care provider. Make sure you discuss any questions you have with your health care provider.   Vaginitis Vaginitis is an inflammation of the vagina. It is most often caused by a change in the normal balance of the bacteria and yeast that live in the vagina. This change in balance causes an overgrowth of certain bacteria or yeast, which causes the inflammation. There are different types of vaginitis, but the most common types are:  Bacterial vaginosis.  Yeast infection (candidiasis).  Trichomoniasis vaginitis. This is a sexually transmitted infection (STI).  Viral vaginitis.  Atropic vaginitis.  Allergic vaginitis. CAUSES  The cause depends on the type of vaginitis. Vaginitis can be caused by:  Bacteria (bacterial vaginosis).  Yeast (yeast infection).  A parasite (trichomoniasis vaginitis)  A virus (viral vaginitis).  Low hormone levels (atrophic vaginitis). Low hormone levels can occur during pregnancy, breastfeeding, or after menopause.  Irritants, such as bubble baths, scented tampons, and feminine sprays (allergic vaginitis). Other factors can change the normal balance of the yeast and bacteria that live in the vagina. These include:  Antibiotic medicines.  Poor hygiene.  Diaphragms, vaginal sponges, spermicides, birth control pills, and intrauterine devices (IUD).  Sexual intercourse.  Infection.  Uncontrolled diabetes.  A weakened immune system. SYMPTOMS  Symptoms can vary depending on  the cause of the vaginitis. Common symptoms include:  Abnormal vaginal discharge.  The discharge is white, gray, or yellow with bacterial vaginosis.  The discharge is thick, white, and cheesy with a yeast infection.  The discharge is frothy and yellow or greenish with trichomoniasis.  A bad vaginal odor.  The odor is fishy with bacterial vaginosis.  Vaginal itching, pain, or swelling.  Painful intercourse.  Pain or burning  when urinating. Sometimes, there are no symptoms. TREATMENT  Treatment will vary depending on the type of infection.   Bacterial vaginosis and trichomoniasis are often treated with antibiotic creams or pills.  Yeast infections are often treated with antifungal medicines, such as vaginal creams or suppositories.  Viral vaginitis has no cure, but symptoms can be treated with medicines that relieve discomfort. Your sexual partner should be treated as well.  Atrophic vaginitis may be treated with an estrogen cream, pill, suppository, or vaginal ring. If vaginal dryness occurs, lubricants and moisturizing creams may help. You may be told to avoid scented soaps, sprays, or douches.  Allergic vaginitis treatment involves quitting the use of the product that is causing the problem. Vaginal creams can be used to treat the symptoms. HOME CARE INSTRUCTIONS   Take all medicines as directed by your caregiver.  Keep your genital area clean and dry. Avoid soap and only rinse the area with water.  Avoid douching. It can remove the healthy bacteria in the vagina.  Do not use tampons or have sexual intercourse until your vaginitis has been treated. Use sanitary pads while you have vaginitis.  Wipe from front to back. This avoids the spread of bacteria from the rectum to the vagina.  Let air reach your genital area.  Wear cotton underwear to decrease moisture buildup.  Avoid wearing underwear while you sleep until your vaginitis is gone.  Avoid tight pants and underwear or nylons without a cotton panel.  Take off wet clothing (especially bathing suits) as soon as possible.  Use mild, non-scented products. Avoid using irritants, such as:  Scented feminine sprays.  Fabric softeners.  Scented detergents.  Scented tampons.  Scented soaps or bubble baths.  Practice safe sex and use condoms. Condoms may prevent the spread of trichomoniasis and viral vaginitis. SEEK MEDICAL CARE IF:   You  have abdominal pain.  You have a fever or persistent symptoms for more than 2-3 days.  You have a fever and your symptoms suddenly get worse. Document Released: 06/27/2007 Document Revised: 05/24/2012 Document Reviewed: 02/10/2012 [facility] Patient Information 2015 [place], [place]. This information is not intended to replace advice given to you by your health care provider. Make sure you discuss any questions you have with your health care provider.

## 2014-03-06 NOTE — ED Provider Notes (Signed)
Mercedes Gardner is a 68 y.o. female who presents to Urgent Care today for left great toe pain. Patient notes severe left great toe pain consistent with prior episodes of gout occurring over the past 3 days. She currently is taking indomethacin which has not helped. She denies any fevers or chills nausea vomiting or diarrhea. She feels well otherwise. Patient additionally notes vaginal discharge. She notes thick white discharge associated with an itchy sensation. This is consistent with prior episodes of yeast infection. She feels well otherwise.   Past Medical History  Diagnosis Date  . Hypertension   . Gout   . Hyperlipidemia April 2014  . Anxiety    History  Substance Use Topics  . Smoking status: Current Some Day Smoker    Types: Cigarettes  . Smokeless tobacco: Never Used  . Alcohol Use: No   ROS as above Medications: No current facility-administered medications for this encounter.   Current Outpatient Prescriptions  Medication Sig Dispense Refill  . amLODipine (NORVASC) 10 MG tablet Take 10 mg by mouth daily.        . clotrimazole-betamethasone (LOTRISONE) cream Apply to affected area 2 times daily prn  45 g  0  . hydrochlorothiazide (HYDRODIURIL) 25 MG tablet Take 25 mg by mouth daily.        [name] Kitchen augmented betamethasone dipropionate (DIPROLENE AF) 0.05 % cream Apply topically 2 (two) times daily.  50 g  5  . colchicine 0.6 MG tablet Take 1 tablet (0.6 mg total) by mouth daily.  30 tablet  0  . diclofenac (CATAFLAM) 50 MG tablet Take 1 tablet (50 mg total) by mouth 3 (three) times daily. Prn back pain.  21 tablet  0  . docusate sodium (COLACE) 100 MG capsule Take 1 capsule (100 mg total) by mouth every 12 (twelve) hours.  60 capsule  0  . fluconazole (DIFLUCAN) 150 MG tablet Take 1 tablet (150 mg total) by mouth once a week.  2 tablet  1  . HYDROcodone-acetaminophen (NORCO/VICODIN) 5-325 MG per tablet Take 1 tablet by mouth every 6 (six) hours as needed.  15 tablet  0  .  metroNIDAZOLE (FLAGYL) 500 MG tablet Take 1 tablet (500 mg total) by mouth 2 (two) times daily.  14 tablet  0  . [DISCONTINUED] lovastatin (MEVACOR) 20 MG tablet Take 20 mg by mouth at bedtime.        Exam:  BP 133/94  Pulse 106  Temp(Src) 98.6 F (37 C) (Oral)  Resp 16  SpO2 98% Gen: Well NAD HEENT: EOMI,  MMM Lungs: Normal work of breathing. CTABL Heart: RRR no MRG Abd: NABS, Soft. NT, ND Exts: Brisk capillary refill, warm and well perfused.  GYN: Normal external genitalia. Vaginal canal thick white discharge. Normal-appearing cervix. Left great toe: Swollen and tender MTP and interphalangeal joint. Refill and sensation are intact.  No results found for this or any previous visit (from the past 24 hour(s)). No results found.  Assessment and Plan: 68 y.o. female with  1) gout flare: Plan to treat with colchicine and Norco 2) vaginitis: Likely yeast. Cytology pending. Treatment with fluconazole  Discussed warning signs or symptoms. Please see discharge instructions. Patient expresses understanding.    Gregor Hams, MD 03/06/14 [phone]

## 2014-03-07 NOTE — ED Notes (Signed)
GC/Chlamydia neg., Affirm: Candida pos., Gardnerella and Trich neg.  Pt. adequately treated with Diflucan. Roselyn Meier 03/07/2014

## 2014-03-19 NOTE — ED Notes (Addendum)
Message on answering machine, insurance will not cover her colchicine Rx. Spoke w pharmacist, who has stated the medication, even though generic is not on the preferred medication formulary list. Discussed w Dr Gerline Legacy, as initial provider  not in clinic this week. DR Juventino Slovak authorized Indocin 50 mg TID, PC #[id number]. Called in to [facility] , [address] at pt request, spoke directly w pharmacist, canlcelled colchicine Rd. Also reminded patient to keep her scheduled appointment w her PCP later this month

## 2014-04-17 ENCOUNTER — Encounter (HOSPITAL_COMMUNITY): Payer: Self-pay | Admitting: Emergency Medicine

## 2014-04-17 ENCOUNTER — Other Ambulatory Visit (HOSPITAL_COMMUNITY)
Admission: RE | Admit: 2014-04-17 | Discharge: 2014-04-17 | Disposition: A | Discharge status: Home / Self Care | Payer: Medicare Other | Source: Ambulatory Visit | Attending: Emergency Medicine | Admitting: Emergency Medicine

## 2014-04-17 ENCOUNTER — Emergency Department (INDEPENDENT_AMBULATORY_CARE_PROVIDER_SITE_OTHER)
Admission: EM | Admit: 2014-04-17 | Discharge: 2014-04-17 | Disposition: A | Discharge status: Home / Self Care | Payer: Medicare Other | Source: Home / Self Care | Attending: Emergency Medicine | Admitting: Emergency Medicine

## 2014-04-17 DIAGNOSIS — Z113 Encounter for screening for infections with a predominantly sexual mode of transmission: Secondary | ICD-10-CM | POA: Insufficient documentation

## 2014-04-17 DIAGNOSIS — N760 Acute vaginitis: Secondary | ICD-10-CM

## 2014-04-17 DIAGNOSIS — J309 Allergic rhinitis, unspecified: Secondary | ICD-10-CM

## 2014-04-17 LAB — POCT URINALYSIS DIP (DEVICE)
Bilirubin Urine: NEGATIVE
GLUCOSE, UA: NEGATIVE mg/dL
Ketones, ur: NEGATIVE mg/dL
Leukocytes, UA: NEGATIVE
NITRITE: NEGATIVE
Protein, ur: NEGATIVE mg/dL
SPECIFIC GRAVITY, URINE: 1.015 (ref 1.005–1.030)
Urobilinogen, UA: 0.2 mg/dL (ref 0.0–1.0)
pH: 5.5 (ref 5.0–8.0)

## 2014-04-17 LAB — GLUCOSE, CAPILLARY: GLUCOSE-CAPILLARY: 94 mg/dL (ref 70–99)

## 2014-04-17 MED ORDER — LORATADINE 10 MG PO TABS: 10.0000 mg | ORAL_TABLET | Freq: Every day | ORAL | Status: DC

## 2014-04-17 MED ORDER — FLUTICASONE PROPIONATE 50 MCG/ACT NA SUSP: 2.0000 | Freq: Every day | NASAL | Status: DC

## 2014-04-17 MED ORDER — MICONAZOLE NITRATE 100 MG VA SUPP
100.0000 mg | Freq: Every day | VAGINAL | Status: DC
Start: 2014-04-17 — End: 2014-05-17

## 2014-04-17 NOTE — ED Provider Notes (Signed)
CSN: [id number]     Arrival date & time 04/17/14  1417 History   First MD Initiated Contact with Patient 04/17/14 1451     Chief Complaint  Patient presents with  . Vaginitis   (Consider location/radiation/quality/duration/timing/severity/associated sxs/prior Treatment) HPI Comments: Presents with 2 days of vaginal itching and is concerned she may have another vaginal yeast infection. Denies irregular bleeding or vaginal discharge. No urinary symptoms, pelvic pain or fever Also wishes to mention that when she discontinued her allergy medication several days ago she developed nasal congestion, occasional dry cough and occasional sneezing.  PCP: [place]  The history is provided by the patient.    Past Medical History  Diagnosis Date  . Hypertension   . Gout   . Hyperlipidemia April 2014  . Anxiety    Past Surgical History  Procedure Laterality Date  . Cesarean section      x3  . Ovarian cyst surgery Right 1980's   Family History  Problem Relation Age of Onset  . Cancer Mother   . Hypertension Mother   . Diabetes Mother   . Diabetes Sister   . Hypertension Sister    History  Substance Use Topics  . Smoking status: Current Some Day Smoker    Types: Cigarettes  . Smokeless tobacco: Never Used  . Alcohol Use: No   OB History   Grav Para Term Preterm Abortions TAB SAB Ect Mult Living   4 3 3  1           Review of Systems  All other systems reviewed and are negative.   Allergies  Review of patient's allergies indicates no known allergies.  Home Medications   Prior to Admission medications   Medication Sig Start Date End Date Taking? Authorizing Provider  amLODipine (NORVASC) 10 MG tablet Take 10 mg by mouth daily.      Historical Provider, MD  augmented betamethasone dipropionate (DIPROLENE AF) 0.05 % cream Apply topically 2 (two) times daily. 08/05/13   Harden Mo, MD  clotrimazole-betamethasone (LOTRISONE) cream Apply to affected area 2 times  daily prn 05/27/13   Liam Graham, PA-C  colchicine 0.6 MG tablet Take 1 tablet (0.6 mg total) by mouth daily. 03/06/14   Gregor Hams, MD  diclofenac (CATAFLAM) 50 MG tablet Take 1 tablet (50 mg total) by mouth 3 (three) times daily. Prn back pain. 08/19/13   Janne Napoleon, NP  docusate sodium (COLACE) 100 MG capsule Take 1 capsule (100 mg total) by mouth every 12 (twelve) hours. 05/27/13   Liam Graham, PA-C  fluconazole (DIFLUCAN) 150 MG tablet Take 1 tablet (150 mg total) by mouth once a week. 03/06/14   Gregor Hams, MD  fluticasone (FLONASE) 50 MCG/ACT nasal spray Place 2 sprays into both nostrils daily. 04/17/14   Audelia Hives Kristina Bertone, PA  hydrochlorothiazide (HYDRODIURIL) 25 MG tablet Take 25 mg by mouth daily.      Historical Provider, MD  HYDROcodone-acetaminophen (NORCO/VICODIN) 5-325 MG per tablet Take 1 tablet by mouth every 6 (six) hours as needed. 03/06/14   Gregor Hams, MD  loratadine (CLARITIN) 10 MG tablet Take 1 tablet (10 mg total) by mouth daily. 04/17/14   Audelia Hives Bracy Pepper, PA  metroNIDAZOLE (FLAGYL) 500 MG tablet Take 1 tablet (500 mg total) by mouth 2 (two) times daily. 02/14/14   Harden Mo, MD  miconazole (MICOTIN) 100 MG vaginal suppository Place 1 suppository (100 mg total) vaginally at bedtime. X 7 nights 04/17/14  Shi Grose Lee H Erek Kowal, PA   BP 128/85  Pulse 99  Temp(Src) 98.5 F (36.9 C) (Oral)  SpO2 99% Physical Exam  Nursing note and vitals reviewed. Constitutional: She is oriented to person, place, and time. She appears well-developed and well-nourished. No distress.  HENT:  Head: Normocephalic and atraumatic.  Eyes: Conjunctivae are normal. No scleral icterus.  Cardiovascular: Normal rate, regular rhythm and normal heart sounds.   Pulmonary/Chest: Effort normal and breath sounds normal.  Abdominal: Soft. Bowel sounds are normal. She exhibits no distension. There is no tenderness.  Genitourinary: Vagina normal. Pelvic exam was performed with patient  supine. There is no rash, tenderness or lesion on the right labia. There is no rash, tenderness or lesion on the left labia. Cervix exhibits no motion tenderness, no discharge and no friability. Right adnexum displays no mass, no tenderness and no fullness. Left adnexum displays no fullness.  Musculoskeletal: Normal range of motion.  Neurological: She is alert and oriented to person, place, and time.  Skin: Skin is warm and dry. No rash noted. No erythema.  Psychiatric: She has a normal mood and affect. Her behavior is normal.    ED Course  Procedures (including critical care time) Labs Review Labs Reviewed  POCT URINALYSIS DIP (DEVICE) - Abnormal; Notable for the following:    Hgb urine dipstick TRACE (*)    All other components within normal limits  GLUCOSE, CAPILLARY  CERVICOVAGINAL ANCILLARY ONLY    Imaging Review No results found.   MDM   1. ALLERGIC RHINITIS   2. Vaginitis    Cervicovaginal swabs sent for testing. Patient advised to begin using Claritin and Flonase for allergic rhinitis and Monistat as directed on packaging for vaginal itching. She was advised that she will be notified by phone if labs indicate need for additional treatment.  CBG tested for recurrent yeast vaginitis and was found to be normal at 94.    Lutricia Feil, [place] 04/17/14 [phone]

## 2014-04-17 NOTE — ED Provider Notes (Signed)
Medical screening examination/treatment/procedure(s) were performed by resident physician or non-physician practitioner and as supervising physician I was immediately available for consultation/collaboration.  Maryruth Eve, MD     Melony Overly, MD 04/17/14 [phone]

## 2014-04-17 NOTE — Discharge Instructions (Signed)
Hay Fever Hay fever is an allergic reaction to particles in the air. It cannot be passed from person to person. It cannot be cured, but it can be controlled. CAUSES  Hay fever is caused by something that triggers an allergic reaction (allergens). The following are examples of allergens:  Ragweed.  Feathers.  Animal dander.  Grass and tree pollens.  Cigarette smoke.  House dust.  Pollution. SYMPTOMS   Sneezing.  Runny or stuffy nose.  Tearing eyes.  Itchy eyes, nose, mouth, throat, skin, or other area.  Sore throat.  Headache.  Decreased sense of smell or taste. DIAGNOSIS Your caregiver will perform a physical exam and ask questions about the symptoms you are having.Allergy testing may be done to determine exactly what triggers your hay fever.  TREATMENT   Over-the-counter medicines may help symptoms. These include:  Antihistamines.  Decongestants. These may help with nasal congestion.  Your caregiver may prescribe medicines if over-the-counter medicines do not work.  Some people benefit from allergy shots when other medicines are not helpful. HOME CARE INSTRUCTIONS   Avoid the allergen that is causing your symptoms, if possible.  Take all medicine as told by your caregiver. SEEK MEDICAL CARE IF:   You have severe allergy symptoms and your current medicines are not helping.  Your treatment was working at one time, but you are now experiencing symptoms.  You have sinus congestion and pressure.  You develop a fever or headache.  You have thick nasal discharge.  You have asthma and have a worsening cough and wheezing. SEEK IMMEDIATE MEDICAL CARE IF:   You have swelling of your tongue or lips.  You have trouble breathing.  You feel lightheaded or like you are going to faint.  You have cold sweats.  You have a fever. Document Released: 08/30/2005 Document Revised: 11/22/2011 Document Reviewed: 11/25/2010 [facility] Patient Information 2015  [place], [place]. This information is not intended to replace advice given to you by your health care provider. Make sure you discuss any questions you have with your health care provider.  Vaginitis Vaginitis is an inflammation of the vagina. It is most often caused by a change in the normal balance of the bacteria and yeast that live in the vagina. This change in balance causes an overgrowth of certain bacteria or yeast, which causes the inflammation. There are different types of vaginitis, but the most common types are:  Bacterial vaginosis.  Yeast infection (candidiasis).  Trichomoniasis vaginitis. This is a sexually transmitted infection (STI).  Viral vaginitis.  Atropic vaginitis.  Allergic vaginitis. CAUSES  The cause depends on the type of vaginitis. Vaginitis can be caused by:  Bacteria (bacterial vaginosis).  Yeast (yeast infection).  A parasite (trichomoniasis vaginitis)  A virus (viral vaginitis).  Low hormone levels (atrophic vaginitis). Low hormone levels can occur during pregnancy, breastfeeding, or after menopause.  Irritants, such as bubble baths, scented tampons, and feminine sprays (allergic vaginitis). Other factors can change the normal balance of the yeast and bacteria that live in the vagina. These include:  Antibiotic medicines.  Poor hygiene.  Diaphragms, vaginal sponges, spermicides, birth control pills, and intrauterine devices (IUD).  Sexual intercourse.  Infection.  Uncontrolled diabetes.  A weakened immune system. SYMPTOMS  Symptoms can vary depending on the cause of the vaginitis. Common symptoms include:  Abnormal vaginal discharge.  The discharge is white, gray, or yellow with bacterial vaginosis.  The discharge is thick, white, and cheesy with a yeast infection.  The discharge is frothy and yellow or  greenish with trichomoniasis.  A bad vaginal odor.  The odor is fishy with bacterial vaginosis.  Vaginal itching, pain, or  swelling.  Painful intercourse.  Pain or burning when urinating. Sometimes, there are no symptoms. TREATMENT  Treatment will vary depending on the type of infection.   Bacterial vaginosis and trichomoniasis are often treated with antibiotic creams or pills.  Yeast infections are often treated with antifungal medicines, such as vaginal creams or suppositories.  Viral vaginitis has no cure, but symptoms can be treated with medicines that relieve discomfort. Your sexual partner should be treated as well.  Atrophic vaginitis may be treated with an estrogen cream, pill, suppository, or vaginal ring. If vaginal dryness occurs, lubricants and moisturizing creams may help. You may be told to avoid scented soaps, sprays, or douches.  Allergic vaginitis treatment involves quitting the use of the product that is causing the problem. Vaginal creams can be used to treat the symptoms. HOME CARE INSTRUCTIONS   Take all medicines as directed by your caregiver.  Keep your genital area clean and dry. Avoid soap and only rinse the area with water.  Avoid douching. It can remove the healthy bacteria in the vagina.  Do not use tampons or have sexual intercourse until your vaginitis has been treated. Use sanitary pads while you have vaginitis.  Wipe from front to back. This avoids the spread of bacteria from the rectum to the vagina.  Let air reach your genital area.  Wear cotton underwear to decrease moisture buildup.  Avoid wearing underwear while you sleep until your vaginitis is gone.  Avoid tight pants and underwear or nylons without a cotton panel.  Take off wet clothing (especially bathing suits) as soon as possible.  Use mild, non-scented products. Avoid using irritants, such as:  Scented feminine sprays.  Fabric softeners.  Scented detergents.  Scented tampons.  Scented soaps or bubble baths.  Practice safe sex and use condoms. Condoms may prevent the spread of trichomoniasis  and viral vaginitis. SEEK MEDICAL CARE IF:   You have abdominal pain.  You have a fever or persistent symptoms for more than 2-3 days.  You have a fever and your symptoms suddenly get worse. Document Released: 06/27/2007 Document Revised: 05/24/2012 Document Reviewed: 02/10/2012 [facility] Patient Information 2015 [place], [place]. This information is not intended to replace advice given to you by your health care provider. Make sure you discuss any questions you have with your health care provider.

## 2014-04-17 NOTE — ED Notes (Signed)
Multiple concerns. Has vaginal itching, and thinks this is another yeast infection. Boyfriend is in hospital ("very sick") and she wants to be sure her coughing and sneezing is not a risk to him . Also nearly out of her gout medicine

## 2014-04-18 ENCOUNTER — Telehealth (HOSPITAL_COMMUNITY): Payer: Self-pay | Admitting: *Deleted

## 2014-04-18 MED ORDER — FLUCONAZOLE 150 MG PO TABS: 150.0000 mg | ORAL_TABLET | Freq: Once | ORAL | Status: DC

## 2014-04-18 NOTE — ED Notes (Signed)
Pt. called back and said the Rx. was not at the pharmacy.  I told her I had given the message to Dr. Georgina Snell and he must have forgotten to do it.  I told her I would ask on of the other doctors to do it.  Dr. Jake Michaelis e-prescribed Diflucan to pt.'s pharmacy. Roselyn Meier 04/18/2014

## 2014-04-18 NOTE — ED Notes (Signed)
She wants to be tablet for yeast infection rather than a suppository. We'll send a prescription to her pharmacy for Diflucan 150 mg, one tablet one time only.  Harden Mo, MD 04/18/14 [phone]

## 2014-04-18 NOTE — ED Notes (Addendum)
Pt. called for her lab results.  Pt. verified x 2 and given results. (GC/Chlamydia neg., Affirm: Candida pos., Gardnerella and Trich neg.)  Pt. told she was adequately treated with the Miconazole supp for the yeast infection.  She said she has fibroids and has difficulty using the supp.  Pt. requested the Diflucan.  I told her to check her pharmacy in 1 hr to see if they have it ready.  Pt. voiced understanding.  Discussed with Dr. Georgina Snell and he said he would change it. Roselyn Meier 04/18/2014

## 2014-05-17 ENCOUNTER — Encounter (HOSPITAL_COMMUNITY): Payer: Self-pay | Admitting: Emergency Medicine

## 2014-05-17 ENCOUNTER — Emergency Department (HOSPITAL_COMMUNITY): Payer: Medicare Other

## 2014-05-17 ENCOUNTER — Emergency Department (HOSPITAL_COMMUNITY)
Admission: EM | Admit: 2014-05-17 | Discharge: 2014-05-17 | Disposition: A | Discharge status: Home / Self Care | Payer: Medicare Other | Attending: Emergency Medicine | Admitting: Emergency Medicine

## 2014-05-17 DIAGNOSIS — B373 Candidiasis of vulva and vagina: Secondary | ICD-10-CM

## 2014-05-17 DIAGNOSIS — Z862 Personal history of diseases of the blood and blood-forming organs and certain disorders involving the immune mechanism: Secondary | ICD-10-CM | POA: Insufficient documentation

## 2014-05-17 DIAGNOSIS — Z791 Long term (current) use of non-steroidal anti-inflammatories (NSAID): Secondary | ICD-10-CM | POA: Diagnosis not present

## 2014-05-17 DIAGNOSIS — M25569 Pain in unspecified knee: Secondary | ICD-10-CM | POA: Diagnosis present

## 2014-05-17 DIAGNOSIS — Z79899 Other long term (current) drug therapy: Secondary | ICD-10-CM | POA: Diagnosis not present

## 2014-05-17 DIAGNOSIS — M109 Gout, unspecified: Secondary | ICD-10-CM | POA: Diagnosis not present

## 2014-05-17 DIAGNOSIS — Z9889 Other specified postprocedural states: Secondary | ICD-10-CM | POA: Diagnosis not present

## 2014-05-17 DIAGNOSIS — I10 Essential (primary) hypertension: Secondary | ICD-10-CM | POA: Insufficient documentation

## 2014-05-17 DIAGNOSIS — Z8639 Personal history of other endocrine, nutritional and metabolic disease: Secondary | ICD-10-CM | POA: Insufficient documentation

## 2014-05-17 DIAGNOSIS — B3731 Acute candidiasis of vulva and vagina: Secondary | ICD-10-CM | POA: Diagnosis not present

## 2014-05-17 DIAGNOSIS — IMO0002 Reserved for concepts with insufficient information to code with codable children: Secondary | ICD-10-CM | POA: Diagnosis not present

## 2014-05-17 DIAGNOSIS — F17200 Nicotine dependence, unspecified, uncomplicated: Secondary | ICD-10-CM | POA: Insufficient documentation

## 2014-05-17 DIAGNOSIS — Z8659 Personal history of other mental and behavioral disorders: Secondary | ICD-10-CM | POA: Insufficient documentation

## 2014-05-17 LAB — WET PREP, GENITAL
CLUE CELLS WET PREP: NONE SEEN
Trich, Wet Prep: NONE SEEN
WBC, Wet Prep HPF POC: NONE SEEN

## 2014-05-17 LAB — URINALYSIS, ROUTINE W REFLEX MICROSCOPIC
BILIRUBIN URINE: NEGATIVE
Glucose, UA: NEGATIVE mg/dL
KETONES UR: NEGATIVE mg/dL
NITRITE: NEGATIVE
PH: 5.5 (ref 5.0–8.0)
PROTEIN: NEGATIVE mg/dL
Specific Gravity, Urine: 1.009 (ref 1.005–1.030)
UROBILINOGEN UA: 0.2 mg/dL (ref 0.0–1.0)

## 2014-05-17 LAB — URINE MICROSCOPIC-ADD ON

## 2014-05-17 MED ORDER — HYDROCHLOROTHIAZIDE 25 MG PO TABS: 25.0000 mg | ORAL_TABLET | Freq: Every day | ORAL | Status: DC

## 2014-05-17 MED ORDER — FLUCONAZOLE 150 MG PO TABS: ORAL_TABLET | ORAL | Status: DC

## 2014-05-17 MED ORDER — COLCHICINE 0.6 MG PO TABS: 0.6000 mg | ORAL_TABLET | Freq: Every day | ORAL | Status: DC

## 2014-05-17 MED ORDER — ACETAMINOPHEN-CODEINE #3 300-30 MG PO TABS: 1.0000 | ORAL_TABLET | Freq: Four times a day (QID) | ORAL | Status: DC | PRN

## 2014-05-17 NOTE — Discharge Instructions (Signed)
Candidal Vulvovaginitis Candidal vulvovaginitis is an infection of the vagina and vulva. The vulva is the skin around the opening of the vagina. This may cause itching and discomfort in and around the vagina.  HOME CARE  Only take medicine as told by your doctor.  Do not have sex (intercourse) until the infection is healed or as told by your doctor.  Practice safe sex.  Tell your sex partner about your infection.  Do not douche or use tampons.  Wear cotton underwear. Do not wear tight pants or panty hose.  Eat yogurt. This may help treat and prevent yeast infections. GET HELP RIGHT AWAY IF:   You have a fever.  Your problems get worse during treatment or do not get better in 3 days.  You have discomfort, irritation, or itching in your vagina or vulva area.  You have pain after sex.  You start to get belly (abdominal) pain. MAKE SURE YOU:  Understand these instructions.  Will watch your condition.  Will get help right away if you are not doing well or get worse. Document Released: 11/26/2008 Document Revised: 09/04/2013 Document Reviewed: 11/26/2008 [facility] Patient Information 2015 [place], [place]. This information is not intended to replace advice given to you by your health care provider. Make sure you discuss any questions you have with your health care provider.  Gout Gout is when your joints become red, sore, and swell (inflamed). This is caused by the buildup of uric acid crystals in the joints. Uric acid is a chemical that is normally in the blood. If the level of uric acid gets too high in the blood, these crystals form in your joints and tissues. Over time, these crystals can form into masses near the joints and tissues. These masses can destroy bone and cause the bone to look misshapen (deformed). HOME CARE   Do not take aspirin for pain.  Only take medicine as told by your doctor.  Rest the joint as much as you can. When in bed, keep sheets and blankets off  painful areas.  Keep the sore joints raised (elevated).  Put warm or cold packs on painful joints. Use of warm or cold packs depends on which works best for you.  Use crutches if the painful joint is in your leg.  Drink enough fluids to keep your pee (urine) clear or pale yellow. Limit alcohol, sugary drinks, and drinks with fructose in them.  Follow your diet instructions. Pay careful attention to how much protein you eat. Include fruits, vegetables, whole grains, and fat-free or low-fat milk products in your daily diet. Talk to your doctor or dietitian about the use of coffee, vitamin C, and cherries. These may help lower uric acid levels.  Keep a healthy body weight. GET HELP RIGHT AWAY IF:   You have watery poop (diarrhea), throw up (vomit), or have any side effects from medicines.  You do not feel better in 24 hours, or you are getting worse.  Your joint becomes suddenly more tender, and you have chills or a fever. MAKE SURE YOU:   Understand these instructions.  Will watch your condition.  Will get help right away if you are not doing well or get worse. Document Released: 06/08/2008 Document Revised: 01/14/2014 Document Reviewed: 04/12/2012 [facility] Patient Information 2015 [place], [place]. This information is not intended to replace advice given to you by your health care provider. Make sure you discuss any questions you have with your health care provider.

## 2014-05-17 NOTE — ED Notes (Signed)
Pt up to bathroom without any problems 

## 2014-05-17 NOTE — ED Notes (Signed)
Pt c/o yeast infection with vaginal itching x's 2 days.  Also c/o gout to left knee, painful to bear weight.

## 2014-05-17 NOTE — ED Notes (Signed)
Pelvic cart @ bedside.  

## 2014-05-17 NOTE — ED Notes (Signed)
Pt given a warm blanket 

## 2014-05-17 NOTE — ED Provider Notes (Signed)
CSN: [id number]     Arrival date & time 05/17/14  1234 History   First MD Initiated Contact with Patient 05/17/14 1600     Chief Complaint  Patient presents with  . Gout  . Vaginitis     (Consider location/radiation/quality/duration/timing/severity/associated sxs/prior Treatment) HPI Comments: History of gout, has mainly affected feet and ankles in prior episodes.   History of recurrent vaginitis.  Patient is a 68 y.o. female presenting with knee pain and vaginal itching.  Knee Pain Location:  Knee Injury: no   Knee location:  L knee Pain details:    Quality:  Aching and throbbing   Radiates to:  Does not radiate   Severity:  Moderate   Onset quality:  Gradual   Duration:  2 days   Timing:  Constant   Progression:  Worsening Chronicity:  Recurrent Dislocation: no   Prior injury to area:  No Relieved by:  NSAIDs Worsened by:  Activity Vaginal Itching This is a recurrent problem. The current episode started in the past 7 days. The problem occurs constantly. The problem has been gradually worsening. Associated symptoms include arthralgias and joint swelling. Pertinent negatives include no abdominal pain or urinary symptoms.    Past Medical History  Diagnosis Date  . Hypertension   . Gout   . Hyperlipidemia April 2014  . Anxiety    Past Surgical History  Procedure Laterality Date  . Cesarean section      x3  . Ovarian cyst surgery Right 1980's   Family History  Problem Relation Age of Onset  . Cancer Mother   . Hypertension Mother   . Diabetes Mother   . Diabetes Sister   . Hypertension Sister    History  Substance Use Topics  . Smoking status: Current Some Day Smoker    Types: Cigarettes  . Smokeless tobacco: Never Used  . Alcohol Use: No   OB History   Grav Para Term Preterm Abortions TAB SAB Ect Mult Living   4 3 3  1           Review of Systems  Gastrointestinal: Negative for abdominal pain.  Genitourinary:       Vaginal itching  Musculoskeletal:  Positive for arthralgias and joint swelling.  All other systems reviewed and are negative.     Allergies  Review of patient's allergies indicates no known allergies.  Home Medications   Prior to Admission medications   Medication Sig Start Date End Date Taking? Authorizing Provider  amLODipine (NORVASC) 10 MG tablet Take 10 mg by mouth daily.      Historical Provider, MD  augmented betamethasone dipropionate (DIPROLENE AF) 0.05 % cream Apply topically 2 (two) times daily. 08/05/13   Harden Mo, MD  clotrimazole-betamethasone (LOTRISONE) cream Apply to affected area 2 times daily prn 05/27/13   Liam Graham, PA-C  colchicine 0.6 MG tablet Take 1 tablet (0.6 mg total) by mouth daily. 03/06/14   Gregor Hams, MD  diclofenac (CATAFLAM) 50 MG tablet Take 1 tablet (50 mg total) by mouth 3 (three) times daily. Prn back pain. 08/19/13   Janne Napoleon, NP  docusate sodium (COLACE) 100 MG capsule Take 1 capsule (100 mg total) by mouth every 12 (twelve) hours. 05/27/13   Liam Graham, PA-C  fluconazole (DIFLUCAN) 150 MG tablet Take 1 tablet (150 mg total) by mouth once a week. 03/06/14   Gregor Hams, MD  fluconazole (DIFLUCAN) 150 MG tablet Take 1 tablet (150 mg total) by mouth once. 04/18/14  Harden Mo, MD  fluticasone [facility]) 50 MCG/ACT nasal spray Place 2 sprays into both nostrils daily. 04/17/14   Audelia Hives Presson, PA  hydrochlorothiazide (HYDRODIURIL) 25 MG tablet Take 25 mg by mouth daily.      Historical Provider, MD  HYDROcodone-acetaminophen (NORCO/VICODIN) 5-325 MG per tablet Take 1 tablet by mouth every 6 (six) hours as needed. 03/06/14   Gregor Hams, MD  loratadine (CLARITIN) 10 MG tablet Take 1 tablet (10 mg total) by mouth daily. 04/17/14   Audelia Hives Presson, PA  metroNIDAZOLE (FLAGYL) 500 MG tablet Take 1 tablet (500 mg total) by mouth 2 (two) times daily. 02/14/14   Harden Mo, MD  miconazole (MICOTIN) 100 MG vaginal suppository Place 1 suppository (100 mg total)  vaginally at bedtime. X 7 nights 04/17/14   Annett Gula H Presson, PA   BP 118/71  Pulse 89  Temp(Src) 98.2 F (36.8 C) (Oral)  Resp 11  Ht 5\' 5"  (1.651 m)  Wt 152 lb (68.947 kg)  BMI 25.29 kg/m2  SpO2 100% Physical Exam  Nursing note and vitals reviewed. Constitutional: She is oriented to person, place, and time. She appears well-developed and well-nourished. No distress.  HENT:  Head: Normocephalic.  Eyes: Pupils are equal, round, and reactive to light.  Neck: Normal range of motion.  Cardiovascular: Normal rate.   Pulmonary/Chest: Effort normal and breath sounds normal.  Abdominal: Soft.  Musculoskeletal: She exhibits edema and tenderness.       Left knee: She exhibits swelling. Tenderness found.       Legs: Lymphadenopathy:    She has no cervical adenopathy.  Neurological: She is alert and oriented to person, place, and time.  Skin: Skin is warm and dry.  Psychiatric: She has a normal mood and affect.    ED Course  Procedures (including critical care time) Labs Review Labs Reviewed  WET PREP, GENITAL  URINALYSIS, ROUTINE W REFLEX MICROSCOPIC    Imaging Review No results found.   EKG Interpretation None     Patient with history of recurrent vaginal candidosis, presents today with vaginal itching.  Wet prep obtained during pelvic exam, reveals yeast.  Will treat with diflucan q 72 hours x 3 doses.  Radiology results reviewed and shared with patient.  No acute findings involving left knee.  Has history of gout, suspect flare.  Doubt septic joint.  Patient is out of HCTZ and colchicine--refill prescriptions provided.  Patient is scheduled to see her PCP in about 2 weeks MDM   Final diagnoses:  None    Gout. Vaginal candidosis.    Norman Herrlich, NP 05/18/14 [phone]

## 2014-05-17 NOTE — ED Notes (Signed)
Pt arrives stating her right knee is hurting due to gout flare up. Pt also states she has been treated for yeast infection and needs to make sure that has cleared up. Pt awake, alert, oriented x4, NAd, VSS.

## 2014-05-18 NOTE — ED Provider Notes (Signed)
Medical screening examination/treatment/procedure(s) were conducted as a shared visit with non-physician practitioner(s) and myself.  I personally evaluated the patient during the encounter.   EKG Interpretation None       Patient with left knee pain. No joint effusion, normal passive ROM. Likely gout. Tx yeast and gout and d/c. Low suspicion for septic joint.  Ephraim Hamburger, MD 05/18/14 [phone]

## 2014-05-28 ENCOUNTER — Encounter: Payer: Self-pay | Admitting: Internal Medicine

## 2014-07-15 ENCOUNTER — Encounter (HOSPITAL_COMMUNITY): Payer: Self-pay | Admitting: Emergency Medicine

## 2014-07-24 ENCOUNTER — Encounter: Payer: Medicare Other | Admitting: Internal Medicine

## 2014-08-13 ENCOUNTER — Encounter (HOSPITAL_COMMUNITY): Payer: Self-pay | Admitting: Emergency Medicine

## 2014-08-13 ENCOUNTER — Emergency Department (INDEPENDENT_AMBULATORY_CARE_PROVIDER_SITE_OTHER)
Admission: EM | Admit: 2014-08-13 | Discharge: 2014-08-13 | Disposition: A | Discharge status: Home / Self Care | Payer: Medicare Other | Source: Home / Self Care | Attending: Family Medicine | Admitting: Family Medicine

## 2014-08-13 DIAGNOSIS — B373 Candidiasis of vulva and vagina: Secondary | ICD-10-CM

## 2014-08-13 DIAGNOSIS — B3731 Acute candidiasis of vulva and vagina: Secondary | ICD-10-CM

## 2014-08-13 LAB — POCT URINALYSIS DIP (DEVICE)
BILIRUBIN URINE: NEGATIVE
GLUCOSE, UA: NEGATIVE mg/dL
Hgb urine dipstick: NEGATIVE
KETONES UR: NEGATIVE mg/dL
NITRITE: NEGATIVE
PH: 6.5 (ref 5.0–8.0)
Protein, ur: NEGATIVE mg/dL
Specific Gravity, Urine: <=1.005 (ref 1.005–1.030)
Urobilinogen, UA: 0.2 mg/dL (ref 0.0–1.0)

## 2014-08-13 MED ORDER — FLUCONAZOLE 150 MG PO TABS: 150.0000 mg | ORAL_TABLET | Freq: Every day | ORAL | Status: DC

## 2014-08-13 NOTE — ED Provider Notes (Signed)
CSN: [id number]     Arrival date & time 08/13/14  1122 History   First MD Initiated Contact with Patient 08/13/14 1246     Chief Complaint  Patient presents with  . Abdominal Pain   (Consider location/radiation/quality/duration/timing/severity/associated sxs/prior Treatment) HPI  Vaginal discharge and discomfort. H/o recurring yeast infections. Current episode started 3 days ago. ABX for UTI 2 wks ago. Deneis frequency, dysuria. Not sexually active. Pt endorses getting yeast infections about every 2 mo.    Past Medical History  Diagnosis Date  . Hypertension   . Gout   . Hyperlipidemia April 2014  . Anxiety    Past Surgical History  Procedure Laterality Date  . Cesarean section      x3  . Ovarian cyst surgery Right 1980's   Family History  Problem Relation Age of Onset  . Cancer Mother   . Hypertension Mother   . Diabetes Mother   . Diabetes Sister   . Hypertension Sister    History  Substance Use Topics  . Smoking status: Current Some Day Smoker -- 0.02 packs/day    Types: Cigarettes  . Smokeless tobacco: Never Used  . Alcohol Use: No   OB History    Gravida Para Term Preterm AB TAB SAB Ectopic Multiple Living   4 3 3  1           Review of Systems Per HPI with all other pertinent systems negative.   Allergies  Review of patient's allergies indicates no known allergies.  Home Medications   Prior to Admission medications   Medication Sig Start Date End Date Taking? Authorizing Provider  acetaminophen-codeine (TYLENOL #3) 300-30 MG per tablet Take 1-2 tablets by mouth every 6 (six) hours as needed for moderate pain. 05/17/14   Norman Herrlich, NP  amLODipine (NORVASC) 10 MG tablet Take 10 mg by mouth daily.      Historical Provider, MD  augmented betamethasone dipropionate (DIPROLENE-AF) 0.05 % cream Apply 1 application topically 2 (two) times daily. 08/05/13   Harden Mo, MD  clotrimazole-betamethasone (LOTRISONE) cream Apply 1 application topically 2  (two) times daily. Apply to affected area 2 times daily prn 05/27/13   Liam Graham, PA-C  colchicine 0.6 MG tablet Take 0.6 mg by mouth daily. 03/06/14   Gregor Hams, MD  colchicine 0.6 MG tablet Take 1 tablet (0.6 mg total) by mouth daily. 05/17/14   Norman Herrlich, NP  fluconazole (DIFLUCAN) 150 MG tablet Take 1 tablet (150 mg total) by mouth daily. Repeat dose in 3 days 08/13/14   Waldemar Dickens, MD  fluticasone [facility]) 50 MCG/ACT nasal spray Place 2 sprays into both nostrils daily. 04/17/14   Audelia Hives Presson, PA  hydrochlorothiazide (HYDRODIURIL) 25 MG tablet Take 25 mg by mouth daily.      Historical Provider, MD  hydrochlorothiazide (HYDRODIURIL) 25 MG tablet Take 1 tablet (25 mg total) by mouth daily. 05/17/14   Norman Herrlich, NP  hydrocortisone 2.5 % cream Apply 1 application topically daily as needed. For itching    Historical Provider, MD  ibuprofen (ADVIL,MOTRIN) 200 MG tablet Take 200 mg by mouth every 6 (six) hours as needed for moderate pain.    Historical Provider, MD  loratadine (CLARITIN) 10 MG tablet Take 10 mg by mouth daily. 04/17/14   Audelia Hives Presson, PA  traMADol (ULTRAM) 50 MG tablet Take 50 mg by mouth every 6 (six) hours as needed for moderate pain.    Historical Provider, MD  BP 129/78 mmHg  Pulse 81  Temp(Src) 98.6 F (37 C) (Oral)  Resp 16  SpO2 100% Physical Exam  Constitutional: She is oriented to person, place, and time. She appears well-developed and well-nourished.  HENT:  Head: Normocephalic and atraumatic.  Eyes: EOM are normal. Pupils are equal, round, and reactive to light.  Neck: Normal range of motion.  Cardiovascular: Normal rate and normal heart sounds.   No murmur heard. Pulmonary/Chest: Breath sounds normal. No respiratory distress.  Abdominal: Soft. She exhibits no distension.  Musculoskeletal: Normal range of motion. She exhibits no edema or tenderness.  Neurological: She is alert and oriented to person, place, and time.   Skin: Skin is warm. No rash noted. She is not diaphoretic.  Psychiatric: She has a normal mood and affect. Her behavior is normal. Thought content normal.    ED Course  Procedures (including critical care time) Labs Review Labs Reviewed - No data to display  Imaging Review No results found.    MDM   1. Yeast vaginitis    Recurring issue for pt Recent ABX likely set of vaginal imbalance Refill Diflucan (3 pills - take one pill 3 days apart) F/u w/ PCP in 2 wks at scheduled routine appt and discuss longterm therapy if needed Precautions given and all questions answered  Linna Darner, MD Family Medicine 08/13/2014, 1:03 PM      Waldemar Dickens, MD 08/13/14 [phone]

## 2014-08-13 NOTE — ED Notes (Signed)
Patient reports recurrent yeast infections. This episode onset 2 days ago. Denies any risk of std.

## 2014-08-13 NOTE — Discharge Instructions (Signed)
You likely have a yeast vaginal infection Please take the yeast medicine as prescribed Please follow up at your regular doctor in a couple of weeks

## 2014-08-21 ENCOUNTER — Telehealth (HOSPITAL_COMMUNITY): Payer: Self-pay | Admitting: *Deleted

## 2014-08-21 NOTE — ED Notes (Signed)
Pt. called and said she thinks she has a UTI.  C/o back pain.  Wants to know if urine was checked. I called pt. back.  Pt. verified x 2 and told her a urine dip was done and only showed trace leucocytes. No culture was done.  She was treated for a yeast infection. She asked if that would cause back pain.  I told her no. She said it could be her mattress. I told her if she has symptoms of UTI she needs to come back and be rechecked. Roselyn Meier 08/21/2014

## 2014-08-29 ENCOUNTER — Emergency Department (INDEPENDENT_AMBULATORY_CARE_PROVIDER_SITE_OTHER)
Admission: EM | Admit: 2014-08-29 | Discharge: 2014-08-29 | Disposition: A | Discharge status: Home / Self Care | Payer: Medicare Other | Source: Home / Self Care | Attending: Family Medicine | Admitting: Family Medicine

## 2014-08-29 ENCOUNTER — Other Ambulatory Visit (HOSPITAL_COMMUNITY)
Admission: RE | Admit: 2014-08-29 | Discharge: 2014-08-29 | Disposition: A | Discharge status: Home / Self Care | Payer: Medicare Other | Source: Ambulatory Visit | Attending: Family Medicine | Admitting: Family Medicine

## 2014-08-29 ENCOUNTER — Encounter (HOSPITAL_COMMUNITY): Payer: Self-pay | Admitting: Emergency Medicine

## 2014-08-29 DIAGNOSIS — N760 Acute vaginitis: Secondary | ICD-10-CM | POA: Diagnosis present

## 2014-08-29 DIAGNOSIS — S39012A Strain of muscle, fascia and tendon of lower back, initial encounter: Secondary | ICD-10-CM

## 2014-08-29 LAB — POCT URINALYSIS DIP (DEVICE)
Bilirubin Urine: NEGATIVE
GLUCOSE, UA: NEGATIVE mg/dL
KETONES UR: NEGATIVE mg/dL
Leukocytes, UA: NEGATIVE
Nitrite: NEGATIVE
Protein, ur: NEGATIVE mg/dL
SPECIFIC GRAVITY, URINE: 1.015 (ref 1.005–1.030)
Urobilinogen, UA: 0.2 mg/dL (ref 0.0–1.0)
pH: 6 (ref 5.0–8.0)

## 2014-08-29 MED ORDER — DICLOFENAC SODIUM 75 MG PO TBEC: 75.0000 mg | DELAYED_RELEASE_TABLET | Freq: Two times a day (BID) | ORAL | Status: DC

## 2014-08-29 MED ORDER — METHOCARBAMOL 500 MG PO TABS: 500.0000 mg | ORAL_TABLET | Freq: Four times a day (QID) | ORAL | Status: DC | PRN

## 2014-08-29 NOTE — ED Notes (Signed)
C/o lower back pain.  No injury.  Denies urinary urgency or frequency.

## 2014-08-29 NOTE — ED Provider Notes (Signed)
CSN: [id number]     Arrival date & time 08/29/14  1335 History   First MD Initiated Contact with Patient 08/29/14 1351     Chief Complaint  Patient presents with  . Back Pain   (Consider location/radiation/quality/duration/timing/severity/associated sxs/prior Treatment) HPI  Back pain: lower back . Started 2 weeks ago. Near spine. Worse w/ lying or sitting for too long. Improves w/ tramadol adn w/ stretching. Increased house work just prior to this starting.   YEast infection: given Diflucan 08/13/14. Pt reports taking the 3 pills every third day. Vaginal discharge adn irritation improved after that time. Pt to f/u w/ PCP on 09/11/14. Reports changing soaps several times during this time. Pt reports being very anxious about an infection. Denies sexual activity, fevers, abd pain, dysuria, frequency.     Past Medical History  Diagnosis Date  . Hypertension   . Gout   . Hyperlipidemia April 2014  . Anxiety    Past Surgical History  Procedure Laterality Date  . Cesarean section      x3  . Ovarian cyst surgery Right 1980's   Family History  Problem Relation Age of Onset  . Cancer Mother   . Hypertension Mother   . Diabetes Mother   . Diabetes Sister   . Hypertension Sister    History  Substance Use Topics  . Smoking status: Current Some Day Smoker -- 0.02 packs/day    Types: Cigarettes  . Smokeless tobacco: Never Used  . Alcohol Use: No   OB History    Gravida Para Term Preterm AB TAB SAB Ectopic Multiple Living   4 3 3  1           Review of Systems Per HPI with all other pertinent systems negative.   Allergies  Review of patient's allergies indicates no known allergies.  Home Medications   Prior to Admission medications   Medication Sig Start Date End Date Taking? Authorizing Provider  amLODipine (NORVASC) 10 MG tablet Take 10 mg by mouth daily.     Yes Historical Provider, MD  colchicine 0.6 MG tablet Take 0.6 mg by mouth daily. 03/06/14  Yes Gregor Hams, MD   hydrochlorothiazide (HYDRODIURIL) 25 MG tablet Take 1 tablet (25 mg total) by mouth daily. 05/17/14  Yes Norman Herrlich, NP  acetaminophen-codeine (TYLENOL #3) 300-30 MG per tablet Take 1-2 tablets by mouth every 6 (six) hours as needed for moderate pain. 05/17/14   Norman Herrlich, NP  augmented betamethasone dipropionate (DIPROLENE-AF) 0.05 % cream Apply 1 application topically 2 (two) times daily. 08/05/13   Harden Mo, MD  clotrimazole-betamethasone (LOTRISONE) cream Apply 1 application topically 2 (two) times daily. Apply to affected area 2 times daily prn 05/27/13   Liam Graham, PA-C  colchicine 0.6 MG tablet Take 1 tablet (0.6 mg total) by mouth daily. 05/17/14   Norman Herrlich, NP  diclofenac (VOLTAREN) 75 MG EC tablet Take 1 tablet (75 mg total) by mouth 2 (two) times daily. 08/29/14   Waldemar Dickens, MD  fluconazole (DIFLUCAN) 150 MG tablet Take 1 tablet (150 mg total) by mouth daily. Repeat dose in 3 days 08/13/14   Waldemar Dickens, MD  fluticasone [facility]) 50 MCG/ACT nasal spray Place 2 sprays into both nostrils daily. 04/17/14   Audelia Hives Presson, PA  hydrochlorothiazide (HYDRODIURIL) 25 MG tablet Take 25 mg by mouth daily.      Historical Provider, MD  hydrocortisone 2.5 % cream Apply 1 application topically daily as needed.  For itching    Historical Provider, MD  ibuprofen (ADVIL,MOTRIN) 200 MG tablet Take 200 mg by mouth every 6 (six) hours as needed for moderate pain.    Historical Provider, MD  loratadine (CLARITIN) 10 MG tablet Take 10 mg by mouth daily. 04/17/14   Audelia Hives Presson, PA  methocarbamol (ROBAXIN) 500 MG tablet Take 1-2 tablets (500-1,000 mg total) by mouth every 6 (six) hours as needed for muscle spasms. 08/29/14   Waldemar Dickens, MD  traMADol (ULTRAM) 50 MG tablet Take 50 mg by mouth every 6 (six) hours as needed for moderate pain.    Historical Provider, MD   BP 127/77 mmHg  Pulse 96  Temp(Src) 98.6 F (37 C) (Oral)  Resp 16  SpO2 97% Physical  Exam  Constitutional: She is oriented to person, place, and time. She appears well-developed and well-nourished. No distress.  HENT:  Head: Normocephalic and atraumatic.  Eyes: EOM are normal. Pupils are equal, round, and reactive to light.  Neck: Normal range of motion.  Cardiovascular: Normal rate, normal heart sounds and intact distal pulses.   No murmur heard. Pulmonary/Chest: Effort normal and breath sounds normal.  Abdominal: Soft. Bowel sounds are normal.  Musculoskeletal: Normal range of motion.  Lumbar perispinal muscles minimally ttp adn tight.   Neurological: She is alert and oriented to person, place, and time.  Skin: Skin is warm. She is not diaphoretic.  Psychiatric: She has a normal mood and affect. Her behavior is normal. Judgment and thought content normal.    ED Course  Procedures (including critical care time) Labs Review Labs Reviewed  POCT URINALYSIS DIP (DEVICE) - Abnormal; Notable for the following:    Hgb urine dipstick TRACE (*)    All other components within normal limits  CERVICOVAGINAL ANCILLARY ONLY    Imaging Review No results found.   MDM   1. Vaginitis   2. Back strain, initial encounter    Wet prep sent Will refill diflucan if needed or treat other infection if noted Start voltaren adn robaxin for back Start heat, massage and exercises Pt to f/u PCP. May need vaginal estrogen cream for symptoms.  Precautions given and all questions answered  Linna Darner, MD Family Medicine 08/29/2014, 2:43 PM      Waldemar Dickens, MD 08/29/14 [phone]

## 2014-08-29 NOTE — Discharge Instructions (Signed)
Your vaginal irritation may be coming from hormone deficiencies or an infection We will call you if an infection shows up on your labwork Please start the voltaren and robaxin for the muscle pain. You likely strained the muscles of the back Please apply heat and massage the area for benefit.  Consider doing some of the exercises outlined below  Back Exercises These exercises may help you when beginning to rehabilitate your injury. Your symptoms may resolve with or without further involvement from your physician, physical therapist or athletic trainer. While completing these exercises, remember:   Restoring tissue flexibility helps normal motion to return to the joints. This allows healthier, less painful movement and activity.  An effective stretch should be held for at least 30 seconds.  A stretch should never be painful. You should only feel a gentle lengthening or release in the stretched tissue. STRETCH - Extension, Prone on Elbows   Lie on your stomach on the floor, a bed will be too soft. Place your palms about shoulder width apart and at the height of your head.  Place your elbows under your shoulders. If this is too painful, stack pillows under your chest.  Allow your body to relax so that your hips drop lower and make contact more completely with the floor.  Hold this position for __________ seconds.  Slowly return to lying flat on the floor. Repeat __________ times. Complete this exercise __________ times per day.  RANGE OF MOTION - Extension, Prone Press Ups   Lie on your stomach on the floor, a bed will be too soft. Place your palms about shoulder width apart and at the height of your head.  Keeping your back as relaxed as possible, slowly straighten your elbows while keeping your hips on the floor. You may adjust the placement of your hands to maximize your comfort. As you gain motion, your hands will come more underneath your shoulders.  Hold this position __________  seconds.  Slowly return to lying flat on the floor. Repeat __________ times. Complete this exercise __________ times per day.  RANGE OF MOTION- Quadruped, Neutral Spine   Assume a hands and knees position on a firm surface. Keep your hands under your shoulders and your knees under your hips. You may place padding under your knees for comfort.  Drop your head and point your tail bone toward the ground below you. This will round out your low back like an angry cat. Hold this position for __________ seconds.  Slowly lift your head and release your tail bone so that your back sags into a large arch, like an old horse.  Hold this position for __________ seconds.  Repeat this until you feel limber in your low back.  Now, find your "sweet spot." This will be the most comfortable position somewhere between the two previous positions. This is your neutral spine. Once you have found this position, tense your stomach muscles to support your low back.  Hold this position for __________ seconds. Repeat __________ times. Complete this exercise __________ times per day.  STRETCH - Flexion, Single Knee to Chest   Lie on a firm bed or floor with both legs extended in front of you.  Keeping one leg in contact with the floor, bring your opposite knee to your chest. Hold your leg in place by either grabbing behind your thigh or at your knee.  Pull until you feel a gentle stretch in your low back. Hold __________ seconds.  Slowly release your grasp and repeat  the exercise with the opposite side. Repeat __________ times. Complete this exercise __________ times per day.  STRETCH - Hamstrings, Standing  Stand or sit and extend your right / left leg, placing your foot on a chair or foot stool  Keeping a slight arch in your low back and your hips straight forward.  Lead with your chest and lean forward at the waist until you feel a gentle stretch in the back of your right / left knee or thigh. (When done  correctly, this exercise requires leaning only a small distance.)  Hold this position for __________ seconds. Repeat __________ times. Complete this stretch __________ times per day. STRENGTHENING - Deep Abdominals, Pelvic Tilt   Lie on a firm bed or floor. Keeping your legs in front of you, bend your knees so they are both pointed toward the ceiling and your feet are flat on the floor.  Tense your lower abdominal muscles to press your low back into the floor. This motion will rotate your pelvis so that your tail bone is scooping upwards rather than pointing at your feet or into the floor.  With a gentle tension and even breathing, hold this position for __________ seconds. Repeat __________ times. Complete this exercise __________ times per day.  STRENGTHENING - Abdominals, Crunches   Lie on a firm bed or floor. Keeping your legs in front of you, bend your knees so they are both pointed toward the ceiling and your feet are flat on the floor. Cross your arms over your chest.  Slightly tip your chin down without bending your neck.  Tense your abdominals and slowly lift your trunk high enough to just clear your shoulder blades. Lifting higher can put excessive stress on the low back and does not further strengthen your abdominal muscles.  Control your return to the starting position. Repeat __________ times. Complete this exercise __________ times per day.  STRENGTHENING - Quadruped, Opposite UE/LE Lift   Assume a hands and knees position on a firm surface. Keep your hands under your shoulders and your knees under your hips. You may place padding under your knees for comfort.  Find your neutral spine and gently tense your abdominal muscles so that you can maintain this position. Your shoulders and hips should form a rectangle that is parallel with the floor and is not twisted.  Keeping your trunk steady, lift your right hand no higher than your shoulder and then your left leg no higher than  your hip. Make sure you are not holding your breath. Hold this position __________ seconds.  Continuing to keep your abdominal muscles tense and your back steady, slowly return to your starting position. Repeat with the opposite arm and leg. Repeat __________ times. Complete this exercise __________ times per day. Document Released: 09/17/2005 Document Revised: 11/22/2011 Document Reviewed: 12/12/2008 [facility] Patient Information 2015 [place], [place]. This information is not intended to replace advice given to you by your health care provider. Make sure you discuss any questions you have with your health care provider.

## 2014-08-30 LAB — CERVICOVAGINAL ANCILLARY ONLY
WET PREP (BD AFFIRM): NEGATIVE
Wet Prep (BD Affirm): NEGATIVE
Wet Prep (BD Affirm): POSITIVE — AB

## 2014-08-31 MED ORDER — FLUCONAZOLE 150 MG PO TABS: ORAL_TABLET | ORAL | Status: DC

## 2014-08-31 NOTE — ED Notes (Signed)
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

## 2014-08-31 NOTE — ED Provider Notes (Signed)
Labs came back positive for Diflucan. It has been treated multiple times recently. Will prescribe 3 tablets of Diflucan, every other day, and she will follow-up with primary care   Meds ordered this encounter  Medications  . diclofenac (VOLTAREN) 75 MG EC tablet    Sig: Take 1 tablet (75 mg total) by mouth 2 (two) times daily.    Dispense:  60 tablet    Refill:  0  . methocarbamol (ROBAXIN) 500 MG tablet    Sig: Take 1-2 tablets (500-1,000 mg total) by mouth every 6 (six) hours as needed for muscle spasms.    Dispense:  60 tablet    Refill:  0  . fluconazole (DIFLUCAN) 150 MG tablet    Sig: 1 tablet every other day    Dispense:  3 tablet    Refill:  0     Liam Graham, PA-C 08/31/14 1423

## 2014-09-02 NOTE — ED Notes (Signed)
Candida pos., Gardnerella and Trich neg.  PA has note that he reviewed and prescribed Diflucan.  Not sure if he called pt. I called pt. Pt. verified x 2 and given results.  Pt. said he did call her and she has picked up her medicine and started taking it. Mercedes Gardner 09/02/2014

## 2014-10-13 ENCOUNTER — Emergency Department (INDEPENDENT_AMBULATORY_CARE_PROVIDER_SITE_OTHER)
Admission: EM | Admit: 2014-10-13 | Discharge: 2014-10-13 | Disposition: A | Discharge status: Home / Self Care | Payer: Medicare Other | Source: Home / Self Care

## 2014-10-13 ENCOUNTER — Encounter (HOSPITAL_COMMUNITY): Payer: Self-pay | Admitting: *Deleted

## 2014-10-13 DIAGNOSIS — B3731 Acute candidiasis of vulva and vagina: Secondary | ICD-10-CM

## 2014-10-13 DIAGNOSIS — B373 Candidiasis of vulva and vagina: Secondary | ICD-10-CM

## 2014-10-13 DIAGNOSIS — M1712 Unilateral primary osteoarthritis, left knee: Secondary | ICD-10-CM

## 2014-10-13 MED ORDER — TERCONAZOLE 80 MG VA SUPP
80.0000 mg | Freq: Every day | VAGINAL | Status: DC
Start: 2014-10-13 — End: 2014-12-26

## 2014-10-13 MED ORDER — FLUCONAZOLE 150 MG PO TABS: 150.0000 mg | ORAL_TABLET | Freq: Once | ORAL | Status: DC

## 2014-10-13 MED ORDER — DICLOFENAC SODIUM 1 % TD GEL: 4.0000 g | Freq: Four times a day (QID) | TRANSDERMAL | Status: DC

## 2014-10-13 NOTE — ED Notes (Signed)
Pt  States  She  Has  Pain in  Both  Knees       She  States trammadol  Not  Agreeing  With  Her  And  Not  Working       She  States  She  Has  appt  Feb 12  With  Dr blackmon       Pt states  She  Has  A yeat infection  As  Well

## 2014-10-13 NOTE — ED Provider Notes (Signed)
CSN: [id number]     Arrival date & time 10/13/14  1425 History   None    Chief Complaint  Patient presents with  . Knee Pain   (Consider location/radiation/quality/duration/timing/severity/associated sxs/prior Treatment) Patient is a 69 y.o. female presenting with knee pain. The history is provided by the patient.  Knee Pain Location:  Knee Time since incident:  6 months Injury: no   Knee location:  L knee and R knee Pain details:    Quality:  Sharp   Severity:  Moderate   Onset quality:  Gradual   Progression:  Worsening Chronicity:  Chronic Dislocation: no   Relieved by:  None tried Worsened by:  Nothing tried Ineffective treatments:  NSAIDs Associated symptoms: no back pain   Risk factors comment:  Has appt with dr Rush Farmer   Past Medical History  Diagnosis Date  . Hypertension   . Gout   . Hyperlipidemia April 2014  . Anxiety    Past Surgical History  Procedure Laterality Date  . Cesarean section      x3  . Ovarian cyst surgery Right 1980's   Family History  Problem Relation Age of Onset  . Cancer Mother   . Hypertension Mother   . Diabetes Mother   . Diabetes Sister   . Hypertension Sister    History  Substance Use Topics  . Smoking status: Current Some Day Smoker -- 0.02 packs/day    Types: Cigarettes  . Smokeless tobacco: Never Used  . Alcohol Use: No   OB History    Gravida Para Term Preterm AB TAB SAB Ectopic Multiple Living   4 3 3  1           Review of Systems  Constitutional: Negative.   Genitourinary: Positive for vaginal discharge.  Musculoskeletal: Negative for back pain, joint swelling and gait problem.  Skin: Negative.     Allergies  Review of patient's allergies indicates no known allergies.  Home Medications   Prior to Admission medications   Medication Sig Start Date End Date Taking? Authorizing Provider  acetaminophen-codeine (TYLENOL #3) 300-30 MG per tablet Take 1-2 tablets by mouth every 6 (six) hours as needed for  moderate pain. 05/17/14   Norman Herrlich, NP  amLODipine (NORVASC) 10 MG tablet Take 10 mg by mouth daily.      Historical Provider, MD  augmented betamethasone dipropionate (DIPROLENE-AF) 0.05 % cream Apply 1 application topically 2 (two) times daily. 08/05/13   Harden Mo, MD  clotrimazole-betamethasone (LOTRISONE) cream Apply 1 application topically 2 (two) times daily. Apply to affected area 2 times daily prn 05/27/13   Liam Graham, PA-C  colchicine 0.6 MG tablet Take 0.6 mg by mouth daily. 03/06/14   Gregor Hams, MD  colchicine 0.6 MG tablet Take 1 tablet (0.6 mg total) by mouth daily. 05/17/14   Norman Herrlich, NP  diclofenac (VOLTAREN) 75 MG EC tablet Take 1 tablet (75 mg total) by mouth 2 (two) times daily. 08/29/14   Waldemar Dickens, MD  diclofenac sodium (VOLTAREN) 1 % GEL Apply 4 g topically 4 (four) times daily. To both knees, please instruct in dosing. 10/13/14   Billy Fischer, MD  fluconazole (DIFLUCAN) 150 MG tablet Take 1 tablet (150 mg total) by mouth once. Repeat in 1 week if needed 10/13/14   Billy Fischer, MD  fluticasone [facility]) 50 MCG/ACT nasal spray Place 2 sprays into both nostrils daily. 04/17/14   Audelia Hives Presson, PA  hydrochlorothiazide (HYDRODIURIL) 25  MG tablet Take 25 mg by mouth daily.      Historical Provider, MD  hydrochlorothiazide (HYDRODIURIL) 25 MG tablet Take 1 tablet (25 mg total) by mouth daily. 05/17/14   Norman Herrlich, NP  hydrocortisone 2.5 % cream Apply 1 application topically daily as needed. For itching    Historical Provider, MD  ibuprofen (ADVIL,MOTRIN) 200 MG tablet Take 200 mg by mouth every 6 (six) hours as needed for moderate pain.    Historical Provider, MD  loratadine (CLARITIN) 10 MG tablet Take 10 mg by mouth daily. 04/17/14   Audelia Hives Presson, PA  methocarbamol (ROBAXIN) 500 MG tablet Take 1-2 tablets (500-1,000 mg total) by mouth every 6 (six) hours as needed for muscle spasms. 08/29/14   Waldemar Dickens, MD  terconazole  (TERAZOL 3) 80 MG vaginal suppository Place 1 suppository (80 mg total) vaginally at bedtime. 10/13/14   Billy Fischer, MD  traMADol (ULTRAM) 50 MG tablet Take 50 mg by mouth every 6 (six) hours as needed for moderate pain.    Historical Provider, MD   BP 158/88 mmHg  Pulse 96  Temp(Src) 98.7 F (37.1 C) (Oral)  Resp 16  SpO2 99% Physical Exam  Constitutional: She is oriented to person, place, and time. She appears well-developed and well-nourished. No distress.  Musculoskeletal: She exhibits tenderness. She exhibits no edema.       Left knee: She exhibits normal range of motion, no swelling and no effusion.  Neurological: She is alert and oriented to person, place, and time.  Skin: Skin is warm and dry.  Nursing note and vitals reviewed.   ED Course  Procedures (including critical care time) Labs Review Labs Reviewed - No data to display  Imaging Review No results found.   MDM   1. Primary osteoarthritis of left knee   2. Candida vaginitis        Billy Fischer, MD 10/13/14 [phone]

## 2014-10-13 NOTE — Discharge Instructions (Signed)
Use medicine as prescribed and see your doctor as planned.

## 2014-11-06 ENCOUNTER — Encounter: Payer: Self-pay | Admitting: Internal Medicine

## 2014-11-22 ENCOUNTER — Other Ambulatory Visit (HOSPITAL_COMMUNITY)
Admission: RE | Admit: 2014-11-22 | Discharge: 2014-11-22 | Disposition: A | Discharge status: Home / Self Care | Payer: Medicare Other | Source: Ambulatory Visit | Attending: Emergency Medicine | Admitting: Emergency Medicine

## 2014-11-22 ENCOUNTER — Emergency Department (INDEPENDENT_AMBULATORY_CARE_PROVIDER_SITE_OTHER)
Admission: EM | Admit: 2014-11-22 | Discharge: 2014-11-22 | Disposition: A | Discharge status: Home / Self Care | Payer: Medicare Other | Source: Home / Self Care | Attending: Emergency Medicine | Admitting: Emergency Medicine

## 2014-11-22 ENCOUNTER — Encounter (HOSPITAL_COMMUNITY): Payer: Self-pay | Admitting: Emergency Medicine

## 2014-11-22 DIAGNOSIS — B373 Candidiasis of vulva and vagina: Secondary | ICD-10-CM | POA: Diagnosis not present

## 2014-11-22 DIAGNOSIS — Z113 Encounter for screening for infections with a predominantly sexual mode of transmission: Secondary | ICD-10-CM | POA: Diagnosis present

## 2014-11-22 DIAGNOSIS — B3731 Acute candidiasis of vulva and vagina: Secondary | ICD-10-CM

## 2014-11-22 DIAGNOSIS — N760 Acute vaginitis: Secondary | ICD-10-CM | POA: Insufficient documentation

## 2014-11-22 LAB — POCT URINALYSIS DIP (DEVICE)
Bilirubin Urine: NEGATIVE
GLUCOSE, UA: NEGATIVE mg/dL
Hgb urine dipstick: NEGATIVE
Ketones, ur: NEGATIVE mg/dL
Nitrite: NEGATIVE
Protein, ur: NEGATIVE mg/dL
Specific Gravity, Urine: 1.015 (ref 1.005–1.030)
UROBILINOGEN UA: 0.2 mg/dL (ref 0.0–1.0)
pH: 7 (ref 5.0–8.0)

## 2014-11-22 LAB — CERVICOVAGINAL ANCILLARY ONLY
Wet Prep (BD Affirm): NEGATIVE
Wet Prep (BD Affirm): NEGATIVE
Wet Prep (BD Affirm): POSITIVE — AB

## 2014-11-22 MED ORDER — FLUCONAZOLE 150 MG PO TABS: 150.0000 mg | ORAL_TABLET | Freq: Once | ORAL | Status: DC

## 2014-11-22 NOTE — ED Provider Notes (Signed)
CSN: [id number]     Arrival date & time 11/22/14  7062 History   First MD Initiated Contact with Patient 11/22/14 1058     Chief Complaint  Patient presents with  . Vaginal Discharge   (Consider location/radiation/quality/duration/timing/severity/associated sxs/prior Treatment) HPI  She is a 69 year old woman here for evaluation of vaginal discharge. She states she's had this discharge for a little over a week. She was seen by her PCP and started on Flagyl. Today is her last day of Flagyl. She states she continues to have a discharge. She denies any odor or itching. She does also report some upset stomach and mild back pain, which she attributes to the Flagyl. She is not currently sexually active.  Past Medical History  Diagnosis Date  . Hypertension   . Gout   . Hyperlipidemia April 2014  . Anxiety    Past Surgical History  Procedure Laterality Date  . Cesarean section      x3  . Ovarian cyst surgery Right 1980's   Family History  Problem Relation Age of Onset  . Cancer Mother   . Hypertension Mother   . Diabetes Mother   . Diabetes Sister   . Hypertension Sister    History  Substance Use Topics  . Smoking status: Current Some Day Smoker -- 0.02 packs/day    Types: Cigarettes  . Smokeless tobacco: Never Used  . Alcohol Use: No   OB History    Gravida Para Term Preterm AB TAB SAB Ectopic Multiple Living   4 3 3  1           Review of Systems  Gastrointestinal: Positive for nausea.  Genitourinary: Positive for vaginal discharge. Negative for dysuria.  Musculoskeletal: Positive for back pain.    Allergies  Review of patient's allergies indicates no known allergies.  Home Medications   Prior to Admission medications   Medication Sig Start Date End Date Taking? Authorizing Provider  acetaminophen-codeine (TYLENOL #3) 300-30 MG per tablet Take 1-2 tablets by mouth every 6 (six) hours as needed for moderate pain. 05/17/14   Etta Quill, NP  amLODipine (NORVASC) 10 MG  tablet Take 10 mg by mouth daily.      Historical Provider, MD  augmented betamethasone dipropionate (DIPROLENE-AF) 0.05 % cream Apply 1 application topically 2 (two) times daily. 08/05/13   Harden Mo, MD  clotrimazole-betamethasone (LOTRISONE) cream Apply 1 application topically 2 (two) times daily. Apply to affected area 2 times daily prn 05/27/13   Liam Graham, PA-C  colchicine 0.6 MG tablet Take 0.6 mg by mouth daily. 03/06/14   Gregor Hams, MD  colchicine 0.6 MG tablet Take 1 tablet (0.6 mg total) by mouth daily. 05/17/14   Etta Quill, NP  diclofenac (VOLTAREN) 75 MG EC tablet Take 1 tablet (75 mg total) by mouth 2 (two) times daily. 08/29/14   Waldemar Dickens, MD  diclofenac sodium (VOLTAREN) 1 % GEL Apply 4 g topically 4 (four) times daily. To both knees, please instruct in dosing. 10/13/14   Billy Fischer, MD  fluconazole (DIFLUCAN) 150 MG tablet Take 1 tablet (150 mg total) by mouth once. Repeat in 3 days if needed 11/22/14   Melony Overly, MD  fluticasone [facility]) 50 MCG/ACT nasal spray Place 2 sprays into both nostrils daily. 04/17/14   Audelia Hives Presson, PA  hydrochlorothiazide (HYDRODIURIL) 25 MG tablet Take 25 mg by mouth daily.      Historical Provider, MD  hydrochlorothiazide (HYDRODIURIL) 25 MG tablet Take  1 tablet (25 mg total) by mouth daily. 05/17/14   Etta Quill, NP  hydrocortisone 2.5 % cream Apply 1 application topically daily as needed. For itching    Historical Provider, MD  ibuprofen (ADVIL,MOTRIN) 200 MG tablet Take 200 mg by mouth every 6 (six) hours as needed for moderate pain.    Historical Provider, MD  loratadine (CLARITIN) 10 MG tablet Take 10 mg by mouth daily. 04/17/14   Audelia Hives Presson, PA  methocarbamol (ROBAXIN) 500 MG tablet Take 1-2 tablets (500-1,000 mg total) by mouth every 6 (six) hours as needed for muscle spasms. 08/29/14   Waldemar Dickens, MD  terconazole (TERAZOL 3) 80 MG vaginal suppository Place 1 suppository (80 mg total) vaginally at  bedtime. 10/13/14   Billy Fischer, MD  traMADol (ULTRAM) 50 MG tablet Take 50 mg by mouth every 6 (six) hours as needed for moderate pain.    Historical Provider, MD   BP 127/100 mmHg  Pulse 66  Temp(Src) 98.1 F (36.7 C) (Oral)  Resp 14  SpO2 100% Physical Exam  Constitutional: She is oriented to person, place, and time. She appears well-developed and well-nourished. No distress.  Cardiovascular: Normal rate.   Pulmonary/Chest: Effort normal.  Genitourinary: There is no rash on the right labia. There is no rash on the left labia. No foreign body around the vagina. No signs of injury around the vagina. Vaginal discharge (chunky white) found.  Neurological: She is alert and oriented to person, place, and time.    ED Course  Procedures (including critical care time) Labs Review Labs Reviewed  POCT URINALYSIS DIP (DEVICE) - Abnormal; Notable for the following:    Leukocytes, UA SMALL (*)    All other components within normal limits  CERVICOVAGINAL ANCILLARY ONLY    Imaging Review No results found.   MDM   1. Candidal vaginitis    Exam consistent with candidal vaginitis. We'll treat with Diflucan. Follow-up as needed.    Melony Overly, MD 11/22/14 1141

## 2014-11-22 NOTE — Discharge Instructions (Signed)
You have a yeast infection. Take 1 Diflucan pill tomorrow. If your symptoms persist on Tuesday, take a second pill. Follow-up as needed.

## 2014-11-22 NOTE — ED Notes (Signed)
Reports vag d/c and lower back pain onset 1 month Has been Rx flagy and diflucan by PCP recently  Denies urinary sx Alert, no signs of acute distress.

## 2014-11-24 ENCOUNTER — Telehealth (HOSPITAL_COMMUNITY): Payer: Self-pay | Admitting: *Deleted

## 2014-11-24 NOTE — ED Notes (Signed)
Pt. called on VM for her lab results and her daughter's labs also.  States she has POA for her daughter.  She called back again.  Pt. verified x 2 and given results.  Samarrah told she was adequately treated with Diflucan for the yeast infection and her daughter only had the U-dip and U-preg done here and both were neg. Roselyn Meier 11/24/2014

## 2014-11-25 LAB — CERVICOVAGINAL ANCILLARY ONLY
Chlamydia: NEGATIVE
Neisseria Gonorrhea: NEGATIVE

## 2014-12-23 ENCOUNTER — Telehealth: Payer: Self-pay | Admitting: *Deleted

## 2014-12-23 ENCOUNTER — Encounter: Payer: Self-pay | Admitting: *Deleted

## 2014-12-23 NOTE — Telephone Encounter (Signed)
Dr. Nehemiah Settle and Dr. Ihor Dow reviewed referral from [facility] and asked to have patient scheduled with GYN ONC.  Appointment scheduled.  Spoke with patient via telephone to make her aware of appointment date/time.  Appointment scheduled with Dr. Denman George on 12/26/14 at 11:45 am.  Explained to patient that appointment was at the [facility].  Patient asked if she had cancer.  I explained that this referral doesn't mean that she has cancer.  Told patient I would send a message to her primary care doctor to make them aware of the appointment with Dr. Denman George.  Patient stated understanding.

## 2014-12-26 ENCOUNTER — Ambulatory Visit: Payer: Medicare Other | Attending: Gynecologic Oncology | Admitting: Gynecologic Oncology

## 2014-12-26 ENCOUNTER — Encounter: Payer: Self-pay | Admitting: Gynecologic Oncology

## 2014-12-26 VITALS — BP 144/90 | HR 90 | Temp 98.2°F | Resp 20 | Ht 65.0 in | Wt 151.3 lb

## 2014-12-26 DIAGNOSIS — R1900 Intra-abdominal and pelvic swelling, mass and lump, unspecified site: Secondary | ICD-10-CM | POA: Insufficient documentation

## 2014-12-26 DIAGNOSIS — Z803 Family history of malignant neoplasm of breast: Secondary | ICD-10-CM | POA: Diagnosis not present

## 2014-12-26 DIAGNOSIS — Z720 Tobacco use: Secondary | ICD-10-CM

## 2014-12-26 NOTE — Progress Notes (Signed)
Consult Note: Gyn-Onc  Consult was requested by Dr. Jeanie Cooks for the evaluation of Mercedes Gardner 69 y.o. female  CC:  Chief Complaint  Patient presents with  . pelvic mass    Assessment/Plan:  Mercedes Gardner  is a 69 y.o.  year old with a large pelvic mass which appears to be arising from the ovary. It is symptomatic. On CT scan it does not appear to be associated with other findings concerning for metastatic ovarian cancer.  I performed a history, physical examination, and personally reviewed the patient's imaging films including the CT abdomen and pelvis from [facility].  I discussed with is broken that I am recommending an exploratory laparotomy with BSO and possible staging (including hysterectomy) if malignancy is identified our frozen section. I explained that I have a low suspicion that this is malignant given its large size, as ovarian cancers less commonly present with one large single dominant cystic mass (particularly of this size). However given its symptomatology and its size and the uncertainty of the potential cancer I recommend removal.  Discussed operative risks including  bleeding, infection, damage to internal organs (such as bladder,ureters, bowels), blood clot, reoperation and rehospitalization. I discussed that these risks are elevated for smokers and encouraged her to quit preoperatively. Discussed anticipated length of stay and hospitalization and perioperative expectations.  We will draw a CA 125 today.  HPI: Mercedes Gardner is a 69 year old G3 P3 who is seen in consultation at the request of Dr.Avbuere for large pelvic abdominal mass. The patient reports having been seen by an urgent care approximately one year ago with abdominal distention and discomfort a pelvic exam was performed at that time and she reports a felt an ovarian cyst. No scans or imaging was performed at that time. No follow-up was performed for 1 year at which time the patient be  presented to urgent care within increasing abdominal girth and increasing symptoms of back pain and abdominal attention. At this time a CT scan of the abdomen and pelvis was obtained which revealed a 24 x 20 x 20 cm large cystic and septated mass arising from the right adnexa to the level of the subhepatic region. Multiple solid nodules were present within the mass. There is no lymphadenopathy or ascites identified. There is no perineal carcinomatosis.  She denies post menopausal bleeding. She is a family history of a mother with breast cancer in her 61s and 3 paternal aunts with breast cancer.  The patient is a smoker who smokes approximate one pack per week.   Current Meds:  Outpatient Encounter Prescriptions as of 12/26/2014  Medication Sig  . acetaminophen-codeine (TYLENOL #3) 300-30 MG per tablet Take 1-2 tablets by mouth every 6 (six) hours as needed for moderate pain.  [name] Kitchen amLODipine (NORVASC) 10 MG tablet Take 10 mg by mouth daily.    [name] Kitchen augmented betamethasone dipropionate (DIPROLENE-AF) 0.05 % cream Apply 1 application topically 2 (two) times daily.  . clotrimazole-betamethasone (LOTRISONE) cream Apply 1 application topically 2 (two) times daily. Apply to affected area 2 times daily prn  . colchicine 0.6 MG tablet Take 1 tablet (0.6 mg total) by mouth daily. (Patient taking differently: Take 0.6 mg by mouth 2 (two) times daily. )  . diclofenac sodium (VOLTAREN) 1 % GEL Apply 4 g topically 4 (four) times daily. To both knees, please instruct in dosing.  . fluticasone (FLONASE) 50 MCG/ACT nasal spray Place 2 sprays into both nostrils daily.  . hydrocortisone 2.5 % cream  Apply 1 application topically daily as needed. For itching  . ibuprofen (ADVIL,MOTRIN) 200 MG tablet Take 200 mg by mouth every 6 (six) hours as needed for moderate pain.  . indomethacin (INDOCIN) 50 MG capsule Take 50 mg by mouth 3 (three) times daily as needed.   . loratadine (CLARITIN) 10 MG tablet Take 10 mg by mouth daily.   [name] Kitchen omeprazole (PRILOSEC) 20 MG capsule Take 20 mg by mouth daily.   . [DISCONTINUED] hydrochlorothiazide (HYDRODIURIL) 25 MG tablet Take 1 tablet (25 mg total) by mouth daily.  . [DISCONTINUED] colchicine 0.6 MG tablet Take 0.6 mg by mouth daily.  . [DISCONTINUED] diclofenac (VOLTAREN) 75 MG EC tablet Take 1 tablet (75 mg total) by mouth 2 (two) times daily.  . [DISCONTINUED] fluconazole (DIFLUCAN) 150 MG tablet Take 1 tablet (150 mg total) by mouth once. Repeat in 3 days if needed  . [DISCONTINUED] hydrochlorothiazide (HYDRODIURIL) 25 MG tablet Take 25 mg by mouth daily.    . [DISCONTINUED] methocarbamol (ROBAXIN) 500 MG tablet Take 1-2 tablets (500-1,000 mg total) by mouth every 6 (six) hours as needed for muscle spasms.  . [DISCONTINUED] terconazole (TERAZOL 3) 80 MG vaginal suppository Place 1 suppository (80 mg total) vaginally at bedtime.  . [DISCONTINUED] traMADol (ULTRAM) 50 MG tablet Take 50 mg by mouth every 6 (six) hours as needed for moderate pain.    Allergy:  Allergies  Allergen Reactions  . Tramadol     Per pt, she got depressed, moody, and had increased back pain when she took tramadol    Social Hx:   History   Social History  . Marital Status: Divorced    Spouse Name: N/A  . Number of Children: N/A  . Years of Education: N/A   Occupational History  . Not on file.   Social History Main Topics  . Smoking status: Current Some Day Smoker -- 0.02 packs/day    Types: Cigarettes  . Smokeless tobacco: Never Used  . Alcohol Use: No  . Drug Use: No  . Sexual Activity: No   Other Topics Concern  . Not on file   Social History Narrative    Past Surgical Hx:  Past Surgical History  Procedure Laterality Date  . Cesarean section      x3  . Ovarian cyst surgery Right 1980's    Past Medical Hx:  Past Medical History  Diagnosis Date  . Hypertension   . Gout   . Hyperlipidemia April 2014  . Anxiety     Past Gynecological History:  Cesarean section x 3  No  LMP recorded. Patient is postmenopausal.  Family Hx:  Family History  Problem Relation Age of Onset  . Cancer Mother   . Hypertension Mother   . Diabetes Mother   . Diabetes Sister   . Hypertension Sister     Review of Systems:  Constitutional  Feels well,    ENT Normal appearing ears and nares bilaterally Skin/Breast  No rash, sores, jaundice, itching, dryness Cardiovascular  No chest pain, shortness of breath, or edema  Pulmonary  No cough or wheeze.  Gastro Intestinal  No nausea, vomitting, or diarrhoea. No bright red blood per rectum, no abdominal pain, change in bowel movement, or constipation.  Genito Urinary  No frequency, urgency, dysuria,  Musculo Skeletal  No myalgia, arthralgia, joint swelling or pain  Neurologic  No weakness, numbness, change in gait,  Psychology  No depression, anxiety, insomnia.   Vitals:  Blood pressure 144/90, pulse 90, temperature 98.2 F (  36.8 C), temperature source Oral, resp. rate 20, height 5\' 5"  (1.651 m), weight 151 lb 4.8 oz (68.629 kg).  Physical Exam: WD in NAD Neck  Supple NROM, without any enlargements.  Lymph Node Survey No cervical supraclavicular or inguinal adenopathy Cardiovascular  Pulse normal rate, regularity and rhythm. S1 and S2 normal.  Lungs  Clear to auscultation bilateraly, without wheezes/crackles/rhonchi. Good air movement.  Skin  No rash/lesions/breakdown  Psychiatry  Alert and oriented to person, place, and time  Abdomen  Normoactive bowel sounds, abdomen soft, Distended with cystic mass on mid and right upper abdomen. non-tender and nonobese without evidence of hernia.  Back No CVA tenderness Genito Urinary  Vulva/vagina: Normal external female genitalia.  No lesions. No discharge or bleeding.  Bladder/urethra:  No lesions or masses, well supported bladder  Vagina: normal and smooth  Cervix: Normal appearing, no lesions.  Uterus: Small, mobile, no parametrial involvement or  nodularity.  Adnexa: Large abdomino-pelvic smooth mass reaching xiphoid. Rectal  Good tone, no masses no cul de sac nodularity.  Extremities  No bilateral cyanosis, clubbing or edema.   Donaciano Eva, MD   12/26/2014, 5:02 PM

## 2014-12-26 NOTE — Patient Instructions (Signed)
Preparing for your Surgery  Plan for surgery on May 26 with Dr. Denman George.  Pre-operative Testing -You will receive a phone call from presurgical testing at [facility] to arrange for a pre-operative testing appointment before your surgery.  This appointment normally occurs one to two weeks before your scheduled surgery.   -Bring your insurance card, copy of an advanced directive if applicable, medication list  -At that visit, you will be asked to sign a consent for a possible blood transfusion in case a transfusion becomes necessary during surgery.  The need for a blood transfusion is rare but having consent is a necessary part of your care.     -You should not be taking blood thinners or aspirin at least ten days prior to surgery unless instructed by your surgeon.  Day Before Surgery at [place] will be asked to take in only clear liquids the day before surgery.  Examples of clear liquids include broths, jello, and clear juices.  You will be advised to have nothing to eat or drink after midnight the evening before.    Your role in recovery Your role is to become active as soon as directed by your doctor, while still giving yourself time to heal.  Rest when you feel tired. You will be asked to do the following in order to speed your recovery:  - Cough and breathe deeply. This helps toclear and expand your lungs and can prevent pneumonia. You may be given a spirometer to practice deep breathing. A staff member will show you how to use the spirometer. - Do mild physical activity. Walking or moving your legs help your circulation and body functions return to normal. A staff member will help you when you try to walk and will provide you with simple exercises. Do not try to get up or walk alone the first time. - Actively manage your pain. Managing your pain lets you move in comfort. We will ask you to rate your pain on a scale of zero to 10. It is your responsibility to tell your  doctor or nurse where and how much you hurt so your pain can be treated.  Special Considerations -If you are diabetic, you may be placed on insulin after surgery to have closer control over your blood sugars to promote healing and recovery.  This does not mean that you will be discharged on insulin.  If applicable, your oral antidiabetics will be resumed when you are tolerating a solid diet.  -Your final pathology results from surgery should be available by the Friday after surgery and the results will be relayed to you when available.  Blood Transfusion Information WHAT IS A BLOOD TRANSFUSION? A transfusion is the replacement of blood or some of its parts. Blood is made up of multiple cells which provide different functions.  Red blood cells carry oxygen and are used for blood loss replacement.  White blood cells fight against infection.  Platelets control bleeding.  Plasma helps clot blood.  Other blood products are available for specialized needs, such as hemophilia or other clotting disorders. BEFORE THE TRANSFUSION  Who gives blood for transfusions?   You may be able to donate blood to be used at a later date on yourself (autologous donation).  Relatives can be asked to donate blood. This is generally not any safer than if you have received blood from a stranger. The same precautions are taken to ensure safety when a relative's blood is donated.  Healthy volunteers who are fully  evaluated to make sure their blood is safe. This is blood bank blood. Transfusion therapy is the safest it has ever been in the practice of medicine. Before blood is taken from a donor, a complete history is taken to make sure that person has no history of diseases nor engages in risky social behavior (examples are intravenous drug use or sexual activity with multiple partners). The donor's travel history is screened to minimize risk of transmitting infections, such as malaria. The donated blood is tested for  signs of infectious diseases, such as HIV and hepatitis. The blood is then tested to be sure it is compatible with you in order to minimize the chance of a transfusion reaction. If you or a relative donates blood, this is often done in anticipation of surgery and is not appropriate for emergency situations. It takes many days to process the donated blood. RISKS AND COMPLICATIONS Although transfusion therapy is very safe and saves many lives, the main dangers of transfusion include:   Getting an infectious disease.  Developing a transfusion reaction. This is an allergic reaction to something in the blood you were given. Every precaution is taken to prevent this. The decision to have a blood transfusion has been considered carefully by your caregiver before blood is given. Blood is not given unless the benefits outweigh the risks.

## 2015-01-06 ENCOUNTER — Encounter (HOSPITAL_BASED_OUTPATIENT_CLINIC_OR_DEPARTMENT_OTHER): Payer: Self-pay | Admitting: Internal Medicine

## 2015-01-10 ENCOUNTER — Telehealth: Payer: Self-pay | Admitting: Gynecologic Oncology

## 2015-01-10 NOTE — Telephone Encounter (Signed)
Called patient and reinforced Dr. Serita Grit recommendations to continue efforts to stop smoking.  Advised to call for any questions or concerns.

## 2015-01-30 NOTE — Patient Instructions (Addendum)
MICHELA HERST  01/30/2015   Your procedure is scheduled on: 02-06-15  Report to [facility] Main  Entrance and follow signs to               [place] at 12:15 PM .  Call this number if you have problems the morning of surgery [phone]   Remember: ONLY 1 PERSON MAY GO WITH YOU TO SHORT STAY TO GET  READY MORNING OF Alyssah Algeo.             Clear liquid diet beginning on a.m. Of 02-05-15. May have clear liquids until 7:30am morning of surgery.  Then nothing by mouth.     Take these medicines the morning of surgery with A SIP OF WATER: Amlodipine, Colchicine, Flonase, Claritin, Prilosec                               You may not have any metal on your body including hair pins and              piercings  Do not wear jewelry, make-up, lotions, powders or perfumes, deodorant             Do not wear nail polish.  Do not shave  48 hours prior to surgery.              Do not bring valuables to the hospital. [place].  Contacts, dentures or bridgework may not be worn into surgery.  Leave suitcase in the car. After surgery it may be brought to your room.    [name] Kitchen   Special Instructions:  Coughing and deep breathing exercises, leg exercises.              Please read over the following fact sheets you were given: _____________________________________________________________________             [facility] - Preparing for Surgery Before surgery, you can play an important role.  Because skin is not sterile, your skin needs to be as free of germs as possible.  You can reduce the number of germs on your skin by washing with CHG (chlorahexidine gluconate) soap before surgery.  CHG is an antiseptic cleaner which kills germs and bonds with the skin to continue killing germs even after washing. Please DO NOT use if you have an allergy to CHG or antibacterial soaps.  If your skin becomes reddened/irritated stop using the  CHG and inform your nurse when you arrive at Short Stay. Do not shave (including legs and underarms) for at least 48 hours prior to the first CHG shower.  You may shave your face/neck. Please follow these instructions carefully:  1.  Shower with CHG Soap the night before surgery and the  morning of Surgery.  2.  If you choose to wash your hair, wash your hair first as usual with your  normal  shampoo.  3.  After you shampoo, rinse your hair and body thoroughly to remove the  shampoo.                           4.  Use CHG as you would any other liquid soap.  You can apply chg directly  to the  skin and wash                       Gently with a scrungie or clean washcloth.  5.  Apply the CHG Soap to your body ONLY FROM THE NECK DOWN.   Do not use on face/ open                           Wound or open sores. Avoid contact with eyes, ears mouth and genitals (private parts).                       Wash face,  Genitals (private parts) with your normal soap.             6.  Wash thoroughly, paying special attention to the area where your surgery  will be performed.  7.  Thoroughly rinse your body with warm water from the neck down.  8.  DO NOT shower/wash with your normal soap after using and rinsing off  the CHG Soap.                9.  Pat yourself dry with a clean towel.            10.  Wear clean pajamas.            11.  Place clean sheets on your bed the night of your first shower and do not  sleep with pets. Day of Surgery : Do not apply any lotions/deodorants the morning of surgery.  Please wear clean clothes to the hospital/surgery center.  FAILURE TO FOLLOW THESE INSTRUCTIONS MAY RESULT IN THE CANCELLATION OF YOUR SURGERY PATIENT SIGNATURE_________________________________  NURSE SIGNATURE__________________________________  ________________________________________________________________________   Adam Phenix  An incentive spirometer is a tool that can help keep your lungs clear  and active. This tool measures how well you are filling your lungs with each breath. Taking long deep breaths may help reverse or decrease the chance of developing breathing (pulmonary) problems (especially infection) following:  A long period of time when you are unable to move or be active. BEFORE THE PROCEDURE   If the spirometer includes an indicator to show your best effort, your nurse or respiratory therapist will set it to a desired goal.  If possible, sit up straight or lean slightly forward. Try not to slouch.  Hold the incentive spirometer in an upright position. INSTRUCTIONS FOR USE   Sit on the edge of your bed if possible, or sit up as far as you can in bed or on a chair.  Hold the incentive spirometer in an upright position.  Breathe out normally.  Place the mouthpiece in your mouth and seal your lips tightly around it.  Breathe in slowly and as deeply as possible, raising the piston or the ball toward the top of the column.  Hold your breath for 3-5 seconds or for as long as possible. Allow the piston or ball to fall to the bottom of the column.  Remove the mouthpiece from your mouth and breathe out normally.  Rest for a few seconds and repeat Steps 1 through 7 at least 10 times every 1-2 hours when you are awake. Take your time and take a few normal breaths between deep breaths.  The spirometer may include an indicator to show your best effort. Use the indicator as a goal to work toward during each repetition.  After each set of  10 deep breaths, practice coughing to be sure your lungs are clear. If you have an incision (the cut made at the time of surgery), support your incision when coughing by placing a pillow or rolled up towels firmly against it. Once you are able to get out of bed, walk around indoors and cough well. You may stop using the incentive spirometer when instructed by your caregiver.  RISKS AND COMPLICATIONS  Take your time so you do not get dizzy or  light-headed.  If you are in pain, you may need to take or ask for pain medication before doing incentive spirometry. It is harder to take a deep breath if you are having pain. AFTER USE  Rest and breathe slowly and easily.  It can be helpful to keep track of a log of your progress. Your caregiver can provide you with a simple table to help with this. If you are using the spirometer at home, follow these instructions: [place] IF:   You are having difficultly using the spirometer.  You have trouble using the spirometer as often as instructed.  Your pain medication is not giving enough relief while using the spirometer.  You develop fever of 100.5 F (38.1 C) or higher. SEEK IMMEDIATE MEDICAL CARE IF:   You cough up bloody sputum that had not been present before.  You develop fever of 102 F (38.9 C) or greater.  You develop worsening pain at or near the incision site. MAKE SURE YOU:   Understand these instructions.  Will watch your condition.  Will get help right away if you are not doing well or get worse. Document Released: 01/10/2007 Document Revised: 11/22/2011 Document Reviewed: 03/13/2007 ExitCare Patient Information 2014 ExitCare, [place].   ________________________________________________________________________  WHAT IS A BLOOD TRANSFUSION? Blood Transfusion Information  A transfusion is the replacement of blood or some of its parts. Blood is made up of multiple cells which provide different functions.  Red blood cells carry oxygen and are used for blood loss replacement.  White blood cells fight against infection.  Platelets control bleeding.  Plasma helps clot blood.  Other blood products are available for specialized needs, such as hemophilia or other clotting disorders. BEFORE THE TRANSFUSION  Who gives blood for transfusions?   Healthy volunteers who are fully evaluated to make sure their blood is safe. This is blood bank  blood. Transfusion therapy is the safest it has ever been in the practice of medicine. Before blood is taken from a donor, a complete history is taken to make sure that person has no history of diseases nor engages in risky social behavior (examples are intravenous drug use or sexual activity with multiple partners). The donor's travel history is screened to minimize risk of transmitting infections, such as malaria. The donated blood is tested for signs of infectious diseases, such as HIV and hepatitis. The blood is then tested to be sure it is compatible with you in order to minimize the chance of a transfusion reaction. If you or a relative donates blood, this is often done in anticipation of surgery and is not appropriate for emergency situations. It takes many days to process the donated blood. RISKS AND COMPLICATIONS Although transfusion therapy is very safe and saves many lives, the main dangers of transfusion include:   Getting an infectious disease.  Developing a transfusion reaction. This is an allergic reaction to something in the blood you were given. Every precaution is taken to prevent this. The decision to have a blood  transfusion has been considered carefully by your caregiver before blood is given. Blood is not given unless the benefits outweigh the risks. AFTER THE TRANSFUSION  Right after receiving a blood transfusion, you will usually feel much better and more energetic. This is especially true if your red blood cells have gotten low (anemic). The transfusion raises the level of the red blood cells which carry oxygen, and this usually causes an energy increase.  The nurse administering the transfusion will monitor you carefully for complications. HOME CARE INSTRUCTIONS  No special instructions are needed after a transfusion. You may find your energy is better. Speak with your caregiver about any limitations on activity for underlying diseases you may have. SEEK MEDICAL CARE IF:    Your condition is not improving after your transfusion.  You develop redness or irritation at the intravenous (IV) site. SEEK IMMEDIATE MEDICAL CARE IF:  Any of the following symptoms occur over the next 12 hours:  Shaking chills.  You have a temperature by mouth above 102 F (38.9 C), not controlled by medicine.  Chest, back, or muscle pain.  People around you feel you are not acting correctly or are confused.  Shortness of breath or difficulty breathing.  Dizziness and fainting.  You get a rash or develop hives.  You have a decrease in urine output.  Your urine turns a dark color or changes to pink, red, or brown. Any of the following symptoms occur over the next 10 days:  You have a temperature by mouth above 102 F (38.9 C), not controlled by medicine.  Shortness of breath.  Weakness after normal activity.  The white part of the eye turns yellow (jaundice).  You have a decrease in the amount of urine or are urinating less often.  Your urine turns a dark color or changes to pink, red, or brown. Document Released: 08/27/2000 Document Revised: 11/22/2011 Document Reviewed: 04/15/2008 ExitCare Patient Information 2014 ExitCare, [place].  _______________________________________________________________________   CLEAR LIQUID DIET   Foods Allowed                                                                     Foods Excluded  Coffee and tea, regular and decaf                             liquids that you cannot  Plain Jell-O in any flavor                                             see through such as: Fruit ices (not with fruit pulp)                                     milk, soups, orange juice  Iced Popsicles                                    All solid food Carbonated beverages, regular and diet  Cranberry, grape and apple juices Sports drinks like Gatorade Lightly seasoned clear broth or consume(fat free) Sugar, honey  syrup  Sample Menu Breakfast                                Lunch                                     Supper Cranberry juice                    Beef broth                            Chicken broth Jell-O                                     Grape juice                           Apple juice Coffee or tea                        Jell-O                                      Popsicle                                                Coffee or tea                        Coffee or tea  _____________________________________________________________________

## 2015-01-31 ENCOUNTER — Ambulatory Visit (HOSPITAL_COMMUNITY)
Admission: RE | Admit: 2015-01-31 | Discharge: 2015-01-31 | Disposition: A | Discharge status: Home / Self Care | Payer: Medicare Other | Source: Ambulatory Visit | Attending: Anesthesiology | Admitting: Anesthesiology

## 2015-01-31 ENCOUNTER — Encounter (HOSPITAL_COMMUNITY)
Admission: RE | Admit: 2015-01-31 | Discharge: 2015-01-31 | Disposition: A | Discharge status: Home / Self Care | Payer: Medicare Other | Source: Ambulatory Visit | Attending: Gynecologic Oncology | Admitting: Gynecologic Oncology

## 2015-01-31 ENCOUNTER — Encounter (HOSPITAL_COMMUNITY): Payer: Self-pay

## 2015-01-31 DIAGNOSIS — R0602 Shortness of breath: Secondary | ICD-10-CM

## 2015-01-31 DIAGNOSIS — F17210 Nicotine dependence, cigarettes, uncomplicated: Secondary | ICD-10-CM | POA: Insufficient documentation

## 2015-01-31 HISTORY — DX: Unspecified osteoarthritis, unspecified site: M19.90

## 2015-01-31 HISTORY — DX: Cardiac arrhythmia, unspecified: I49.9

## 2015-01-31 HISTORY — DX: Gastro-esophageal reflux disease without esophagitis: K21.9

## 2015-01-31 HISTORY — DX: Reserved for inherently not codable concepts without codable children: IMO0001

## 2015-01-31 HISTORY — DX: Anemia, unspecified: D64.9

## 2015-01-31 LAB — URINALYSIS, ROUTINE W REFLEX MICROSCOPIC
BILIRUBIN URINE: NEGATIVE
Glucose, UA: NEGATIVE mg/dL
KETONES UR: NEGATIVE mg/dL
NITRITE: NEGATIVE
PROTEIN: NEGATIVE mg/dL
Specific Gravity, Urine: 1.017 (ref 1.005–1.030)
Urobilinogen, UA: 0.2 mg/dL (ref 0.0–1.0)
pH: 7 (ref 5.0–8.0)

## 2015-01-31 LAB — COMPREHENSIVE METABOLIC PANEL
ALK PHOS: 88 U/L (ref 38–126)
ALT: 15 U/L (ref 14–54)
ANION GAP: 8 (ref 5–15)
AST: 18 U/L (ref 15–41)
Albumin: 4.1 g/dL (ref 3.5–5.0)
BILIRUBIN TOTAL: 1.1 mg/dL (ref 0.3–1.2)
BUN: 12 mg/dL (ref 6–20)
CHLORIDE: 105 mmol/L (ref 101–111)
CO2: 27 mmol/L (ref 22–32)
Calcium: 9.9 mg/dL (ref 8.9–10.3)
Creatinine, Ser: 0.84 mg/dL (ref 0.44–1.00)
GFR calc Af Amer: >60 mL/min (ref >60)
GFR calc non Af Amer: >60 mL/min (ref >60)
Glucose, Bld: 101 mg/dL — ABNORMAL HIGH (ref 65–99)
POTASSIUM: 3.9 mmol/L (ref 3.5–5.1)
Sodium: 140 mmol/L (ref 135–145)
TOTAL PROTEIN: 7.9 g/dL (ref 6.5–8.1)

## 2015-01-31 LAB — ABO/RH: ABO/RH(D): A POS

## 2015-01-31 LAB — CBC
HEMATOCRIT: 39.5 % (ref 36.0–46.0)
HEMOGLOBIN: 12.4 g/dL (ref 12.0–15.0)
MCH: 30 pg (ref 26.0–34.0)
MCHC: 31.4 g/dL (ref 30.0–36.0)
MCV: 95.4 fL (ref 78.0–100.0)
PLATELETS: 226 10*3/uL (ref 150–400)
RBC: 4.14 MIL/uL (ref 3.87–5.11)
RDW: 12.6 % (ref 11.5–15.5)
WBC: 8.1 10*3/uL (ref 4.0–10.5)

## 2015-01-31 LAB — URINE MICROSCOPIC-ADD ON

## 2015-01-31 NOTE — Progress Notes (Signed)
U/A and micro results faxed via EPIC to Dr Denman George and Joylene John, NP.

## 2015-01-31 NOTE — Progress Notes (Signed)
EKG- 01/15/15 on chart along with OV note from PCP- Dr Jeanie Cooks- 01/22/15 on chart  And labs done 01/15/2015 of CBC/DIFF/CMP

## 2015-02-01 LAB — CA 125: CA 125: 13.7 U/mL (ref 0.0–34.0)

## 2015-02-06 ENCOUNTER — Inpatient Hospital Stay (HOSPITAL_COMMUNITY): Payer: Medicare Other | Admitting: Registered Nurse

## 2015-02-06 ENCOUNTER — Encounter (HOSPITAL_COMMUNITY)
Admission: RE | Disposition: A | Discharge status: Home / Self Care | Payer: Self-pay | Source: Ambulatory Visit | Attending: Gynecologic Oncology

## 2015-02-06 ENCOUNTER — Inpatient Hospital Stay (HOSPITAL_COMMUNITY)
Admission: RE | Admit: 2015-02-06 | Discharge: 2015-02-07 | DRG: 743 | Disposition: A | Discharge status: Home / Self Care | Payer: Medicare Other | Source: Ambulatory Visit | Attending: Gynecologic Oncology | Admitting: Gynecologic Oncology

## 2015-02-06 ENCOUNTER — Encounter (HOSPITAL_COMMUNITY): Payer: Self-pay | Admitting: Gynecologic Oncology

## 2015-02-06 DIAGNOSIS — F17200 Nicotine dependence, unspecified, uncomplicated: Secondary | ICD-10-CM

## 2015-02-06 DIAGNOSIS — Z79899 Other long term (current) drug therapy: Secondary | ICD-10-CM | POA: Diagnosis not present

## 2015-02-06 DIAGNOSIS — Z8249 Family history of ischemic heart disease and other diseases of the circulatory system: Secondary | ICD-10-CM | POA: Diagnosis not present

## 2015-02-06 DIAGNOSIS — Z833 Family history of diabetes mellitus: Secondary | ICD-10-CM | POA: Diagnosis not present

## 2015-02-06 DIAGNOSIS — M179 Osteoarthritis of knee, unspecified: Secondary | ICD-10-CM | POA: Diagnosis present

## 2015-02-06 DIAGNOSIS — R1900 Intra-abdominal and pelvic swelling, mass and lump, unspecified site: Secondary | ICD-10-CM | POA: Diagnosis present

## 2015-02-06 DIAGNOSIS — D270 Benign neoplasm of right ovary: Secondary | ICD-10-CM | POA: Diagnosis present

## 2015-02-06 DIAGNOSIS — I10 Essential (primary) hypertension: Secondary | ICD-10-CM | POA: Diagnosis present

## 2015-02-06 DIAGNOSIS — F17210 Nicotine dependence, cigarettes, uncomplicated: Secondary | ICD-10-CM | POA: Diagnosis present

## 2015-02-06 DIAGNOSIS — N8320 Unspecified ovarian cysts: Secondary | ICD-10-CM | POA: Diagnosis present

## 2015-02-06 DIAGNOSIS — E785 Hyperlipidemia, unspecified: Secondary | ICD-10-CM | POA: Diagnosis present

## 2015-02-06 HISTORY — PX: SALPINGOOPHORECTOMY: SHX82

## 2015-02-06 LAB — BASIC METABOLIC PANEL
Anion gap: 7 (ref 5–15)
BUN: 9 mg/dL (ref 6–20)
CHLORIDE: 107 mmol/L (ref 101–111)
CO2: 25 mmol/L (ref 22–32)
Calcium: 9.2 mg/dL (ref 8.9–10.3)
Creatinine, Ser: 0.77 mg/dL (ref 0.44–1.00)
GFR calc non Af Amer: >60 mL/min (ref >60)
GLUCOSE: 114 mg/dL — AB (ref 65–99)
Potassium: 3.8 mmol/L (ref 3.5–5.1)
Sodium: 139 mmol/L (ref 135–145)

## 2015-02-06 LAB — CBC
HEMATOCRIT: 35.8 % — AB (ref 36.0–46.0)
Hemoglobin: 11.4 g/dL — ABNORMAL LOW (ref 12.0–15.0)
MCH: 30 pg (ref 26.0–34.0)
MCHC: 31.8 g/dL (ref 30.0–36.0)
MCV: 94.2 fL (ref 78.0–100.0)
Platelets: 178 10*3/uL (ref 150–400)
RBC: 3.8 MIL/uL — ABNORMAL LOW (ref 3.87–5.11)
RDW: 12.2 % (ref 11.5–15.5)
WBC: 9.9 10*3/uL (ref 4.0–10.5)

## 2015-02-06 LAB — TYPE AND SCREEN
ABO/RH(D): A POS
Antibody Screen: NEGATIVE

## 2015-02-06 SURGERY — SALPINGO-OOPHORECTOMY, OPEN
Anesthesia: General

## 2015-02-06 MED ORDER — PNEUMOCOCCAL VAC POLYVALENT 25 MCG/0.5ML IJ INJ
0.5000 mL | INJECTION | INTRAMUSCULAR | Status: DC | PRN
  Filled 2015-02-06: qty 0.5

## 2015-02-06 MED ORDER — SUFENTANIL CITRATE 50 MCG/ML IV SOLN
INTRAVENOUS | Status: DC | PRN
  Administered 2015-02-06: 5 ug via INTRAVENOUS
  Administered 2015-02-06: 10 ug via INTRAVENOUS
  Administered 2015-02-06: 5 ug via INTRAVENOUS

## 2015-02-06 MED ORDER — SUFENTANIL CITRATE 50 MCG/ML IV SOLN
INTRAVENOUS | Status: AC
  Filled 2015-02-06: qty 1

## 2015-02-06 MED ORDER — HYDROMORPHONE HCL 1 MG/ML IJ SOLN
INTRAMUSCULAR | Status: DC | PRN
  Administered 2015-02-06 (×2): 1 mg via INTRAVENOUS

## 2015-02-06 MED ORDER — MIDAZOLAM HCL 2 MG/2ML IJ SOLN
INTRAMUSCULAR | Status: AC
  Filled 2015-02-06: qty 2

## 2015-02-06 MED ORDER — ONDANSETRON HCL 4 MG/2ML IJ SOLN
INTRAMUSCULAR | Status: AC
  Filled 2015-02-06: qty 2

## 2015-02-06 MED ORDER — AMLODIPINE BESYLATE 10 MG PO TABS
10.0000 mg | ORAL_TABLET | Freq: Every morning | ORAL | Status: DC
  Administered 2015-02-07: 10 mg via ORAL
  Filled 2015-02-06: qty 1

## 2015-02-06 MED ORDER — SUCCINYLCHOLINE CHLORIDE 20 MG/ML IJ SOLN
INTRAMUSCULAR | Status: DC | PRN
  Administered 2015-02-06: 100 mg via INTRAVENOUS

## 2015-02-06 MED ORDER — MAGNESIUM HYDROXIDE 400 MG/5ML PO SUSP
30.0000 mL | Freq: Three times a day (TID) | ORAL | Status: DC
  Administered 2015-02-06 – 2015-02-07 (×2): 30 mL via ORAL
  Filled 2015-02-06 (×2): qty 30

## 2015-02-06 MED ORDER — LIDOCAINE HCL (CARDIAC) 20 MG/ML IV SOLN
INTRAVENOUS | Status: AC
  Filled 2015-02-06: qty 5

## 2015-02-06 MED ORDER — LIDOCAINE HCL (CARDIAC) 20 MG/ML IV SOLN
INTRAVENOUS | Status: DC | PRN
  Administered 2015-02-06: 50 mg via INTRAVENOUS

## 2015-02-06 MED ORDER — NEOSTIGMINE METHYLSULFATE 10 MG/10ML IV SOLN
INTRAVENOUS | Status: DC | PRN
  Administered 2015-02-06: 3 mg via INTRAVENOUS

## 2015-02-06 MED ORDER — PROPOFOL 10 MG/ML IV BOLUS
INTRAVENOUS | Status: AC
  Filled 2015-02-06: qty 20

## 2015-02-06 MED ORDER — ACETAMINOPHEN 500 MG PO TABS
1000.0000 mg | ORAL_TABLET | Freq: Four times a day (QID) | ORAL | Status: DC
  Administered 2015-02-06 – 2015-02-07 (×4): 1000 mg via ORAL
  Filled 2015-02-06 (×8): qty 2

## 2015-02-06 MED ORDER — BUPIVACAINE LIPOSOME 1.3 % IJ SUSP
20.0000 mL | Freq: Once | INTRAMUSCULAR | Status: AC
  Administered 2015-02-06: 20 mL
  Filled 2015-02-06: qty 20

## 2015-02-06 MED ORDER — DEXAMETHASONE SODIUM PHOSPHATE 10 MG/ML IJ SOLN
INTRAMUSCULAR | Status: AC
  Filled 2015-02-06: qty 1

## 2015-02-06 MED ORDER — OXYCODONE HCL 5 MG PO TABS
5.0000 mg | ORAL_TABLET | ORAL | Status: DC | PRN
  Administered 2015-02-07 (×4): 5 mg via ORAL
  Filled 2015-02-06 (×4): qty 1

## 2015-02-06 MED ORDER — ROCURONIUM BROMIDE 100 MG/10ML IV SOLN
INTRAVENOUS | Status: DC | PRN
  Administered 2015-02-06: 35 mg via INTRAVENOUS

## 2015-02-06 MED ORDER — LACTATED RINGERS IV SOLN
INTRAVENOUS | Status: DC
  Administered 2015-02-06: 1000 mL via INTRAVENOUS

## 2015-02-06 MED ORDER — LACTATED RINGERS IV SOLN
INTRAVENOUS | Status: DC | PRN
  Administered 2015-02-06 (×2): via INTRAVENOUS

## 2015-02-06 MED ORDER — NICOTINE POLACRILEX 2 MG MT GUM
4.0000 mg | CHEWING_GUM | Freq: Three times a day (TID) | OROMUCOSAL | Status: DC
  Administered 2015-02-06 – 2015-02-07 (×3): 4 mg via ORAL
  Filled 2015-02-06 (×5): qty 2

## 2015-02-06 MED ORDER — SODIUM CHLORIDE 0.9 % IJ SOLN
INTRAMUSCULAR | Status: AC
  Filled 2015-02-06: qty 20

## 2015-02-06 MED ORDER — DEXAMETHASONE SODIUM PHOSPHATE 10 MG/ML IJ SOLN
INTRAMUSCULAR | Status: DC | PRN
  Administered 2015-02-06: 10 mg via INTRAVENOUS

## 2015-02-06 MED ORDER — SODIUM CHLORIDE 0.9 % IJ SOLN
INTRAMUSCULAR | Status: AC
  Filled 2015-02-06: qty 10

## 2015-02-06 MED ORDER — PROPOFOL 10 MG/ML IV BOLUS
INTRAVENOUS | Status: DC | PRN
  Administered 2015-02-06: 200 mg via INTRAVENOUS

## 2015-02-06 MED ORDER — MIDAZOLAM HCL 5 MG/5ML IJ SOLN
INTRAMUSCULAR | Status: DC | PRN
  Administered 2015-02-06: 2 mg via INTRAVENOUS

## 2015-02-06 MED ORDER — CEFAZOLIN SODIUM-DEXTROSE 2-3 GM-% IV SOLR
2.0000 g | INTRAVENOUS | Status: AC
  Administered 2015-02-06: 2 g via INTRAVENOUS

## 2015-02-06 MED ORDER — ENOXAPARIN SODIUM 40 MG/0.4ML ~~LOC~~ SOLN
40.0000 mg | SUBCUTANEOUS | Status: AC
  Administered 2015-02-06: 40 mg via SUBCUTANEOUS
  Filled 2015-02-06: qty 0.4

## 2015-02-06 MED ORDER — CEFAZOLIN SODIUM-DEXTROSE 2-3 GM-% IV SOLR
INTRAVENOUS | Status: AC
  Filled 2015-02-06: qty 50

## 2015-02-06 MED ORDER — 0.9 % SODIUM CHLORIDE (POUR BTL) OPTIME
TOPICAL | Status: DC | PRN
  Administered 2015-02-06: 1000 mL

## 2015-02-06 MED ORDER — HYDROMORPHONE HCL 2 MG/ML IJ SOLN
INTRAMUSCULAR | Status: AC
  Filled 2015-02-06: qty 1

## 2015-02-06 MED ORDER — ONDANSETRON HCL 4 MG PO TABS: 4.0000 mg | ORAL_TABLET | Freq: Four times a day (QID) | ORAL | Status: DC | PRN

## 2015-02-06 MED ORDER — POTASSIUM CHLORIDE IN NACL 20-0.45 MEQ/L-% IV SOLN
INTRAVENOUS | Status: DC
  Administered 2015-02-06: 17:00:00 via INTRAVENOUS
  Filled 2015-02-06 (×3): qty 1000

## 2015-02-06 MED ORDER — ONDANSETRON HCL 4 MG/2ML IJ SOLN
INTRAMUSCULAR | Status: DC | PRN
  Administered 2015-02-06: 4 mg via INTRAVENOUS

## 2015-02-06 MED ORDER — IBUPROFEN 800 MG PO TABS
800.0000 mg | ORAL_TABLET | Freq: Three times a day (TID) | ORAL | Status: DC
Start: 2015-02-06 — End: 2015-02-07
  Administered 2015-02-06 – 2015-02-07 (×3): 800 mg via ORAL
  Filled 2015-02-06 (×6): qty 1

## 2015-02-06 MED ORDER — GLYCOPYRROLATE 0.2 MG/ML IJ SOLN
INTRAMUSCULAR | Status: DC | PRN
  Administered 2015-02-06: 0.6 mg via INTRAVENOUS

## 2015-02-06 MED ORDER — ENSURE ENLIVE PO LIQD
237.0000 mL | Freq: Two times a day (BID) | ORAL | Status: DC
Start: 2015-02-06 — End: 2015-02-07
  Administered 2015-02-06: 237 mL via ORAL

## 2015-02-06 MED ORDER — ONDANSETRON HCL 4 MG/2ML IJ SOLN: 4.0000 mg | Freq: Four times a day (QID) | INTRAMUSCULAR | Status: DC | PRN

## 2015-02-06 MED ORDER — COLCHICINE 0.6 MG PO TABS
0.6000 mg | ORAL_TABLET | Freq: Every day | ORAL | Status: DC
  Administered 2015-02-07: 0.6 mg via ORAL
  Filled 2015-02-06: qty 1

## 2015-02-06 MED ORDER — ROCURONIUM BROMIDE 100 MG/10ML IV SOLN
INTRAVENOUS | Status: AC
  Filled 2015-02-06: qty 1

## 2015-02-06 MED ORDER — HYDROMORPHONE HCL 1 MG/ML IJ SOLN
INTRAMUSCULAR | Status: AC
  Filled 2015-02-06: qty 1

## 2015-02-06 MED ORDER — HYDROMORPHONE HCL 1 MG/ML IJ SOLN
0.5000 mg | INTRAMUSCULAR | Status: AC | PRN
  Administered 2015-02-06 (×2): 0.5 mg via INTRAVENOUS
  Filled 2015-02-06 (×2): qty 1

## 2015-02-06 MED ORDER — HYDROMORPHONE HCL 1 MG/ML IJ SOLN
0.2500 mg | INTRAMUSCULAR | Status: DC | PRN
  Administered 2015-02-06 (×2): 0.5 mg via INTRAVENOUS

## 2015-02-06 MED ORDER — PANTOPRAZOLE SODIUM 40 MG PO TBEC
40.0000 mg | DELAYED_RELEASE_TABLET | Freq: Every day | ORAL | Status: DC
  Administered 2015-02-07: 40 mg via ORAL
  Filled 2015-02-06 (×2): qty 1

## 2015-02-06 MED ORDER — SODIUM CHLORIDE 0.9 % IJ SOLN
INTRAMUSCULAR | Status: DC | PRN
  Administered 2015-02-06: 20 mL

## 2015-02-06 SURGICAL SUPPLY — 35 items
ATTRACTOMAT 16X20 MAGNETIC DRP (DRAPES) ×3 IMPLANT
BLADE EXTENDED COATED 6.5IN (ELECTRODE) ×3 IMPLANT
CELLS DAT CNTRL 66122 CELL SVR (MISCELLANEOUS) ×2 IMPLANT
CHLORAPREP W/TINT 26ML (MISCELLANEOUS) ×3 IMPLANT
CLIP TI MEDIUM LARGE 6 (CLIP) ×3 IMPLANT
CONT SPEC 4OZ CLIKSEAL STRL BL (MISCELLANEOUS) ×3 IMPLANT
COVER SURGICAL LIGHT HANDLE (MISCELLANEOUS) ×3 IMPLANT
DRAPE INCISE IOBAN 66X45 STRL (DRAPES) IMPLANT
DRAPE WARM FLUID 44X44 (DRAPE) ×3 IMPLANT
ELECT REM PT RETURN 9FT ADLT (ELECTROSURGICAL) ×3
ELECTRODE REM PT RTRN 9FT ADLT (ELECTROSURGICAL) ×2 IMPLANT
GAUZE SPONGE 4X4 12PLY STRL (GAUZE/BANDAGES/DRESSINGS) ×3 IMPLANT
GAUZE SPONGE 4X4 16PLY XRAY LF (GAUZE/BANDAGES/DRESSINGS) ×3 IMPLANT
GLOVE BIO SURGEON STRL SZ 6.5 (GLOVE) ×3 IMPLANT
GLOVE BIOGEL M STRL SZ7.5 (GLOVE) ×6 IMPLANT
GOWN STRL REUS W/ TWL LRG LVL3 (GOWN DISPOSABLE) ×4 IMPLANT
GOWN STRL REUS W/TWL LRG LVL3 (GOWN DISPOSABLE) ×6
KIT BASIN OR (CUSTOM PROCEDURE TRAY) ×3 IMPLANT
LIQUID BAND (GAUZE/BANDAGES/DRESSINGS) ×3 IMPLANT
NS IRRIG 1000ML POUR BTL (IV SOLUTION) ×6 IMPLANT
PACK GENERAL/GYN (CUSTOM PROCEDURE TRAY) ×3 IMPLANT
RTRCTR WOUND ALEXIS 18CM MED (MISCELLANEOUS) ×3
SHEET LAVH (DRAPES) ×3 IMPLANT
SPONGE LAP 18X18 X RAY DECT (DISPOSABLE) ×3 IMPLANT
STAPLER VISISTAT 35W (STAPLE) IMPLANT
SUT PDS AB 1 TP1 96 (SUTURE) ×6 IMPLANT
SUT VIC AB 0 CT1 36 (SUTURE) ×3 IMPLANT
SUT VIC AB 2-0 CT2 27 (SUTURE) ×6 IMPLANT
SUT VIC AB 2-0 SH 27 (SUTURE) ×12
SUT VIC AB 2-0 SH 27X BRD (SUTURE) ×8 IMPLANT
SUT VICRYL 2 0 18  UND BR (SUTURE) ×1
SUT VICRYL 2 0 18 UND BR (SUTURE) ×2 IMPLANT
TOWEL OR 17X26 10 PK STRL BLUE (TOWEL DISPOSABLE) ×3 IMPLANT
TOWEL OR NON WOVEN STRL DISP B (DISPOSABLE) ×3 IMPLANT
TRAY FOLEY W/METER SILVER 14FR (SET/KITS/TRAYS/PACK) ×3 IMPLANT

## 2015-02-06 NOTE — Transfer of Care (Signed)
Immediate Anesthesia Transfer of Care Note  Patient: Mercedes Gardner  Procedure(s) Performed: Procedure(s): EXPLORAROTORY LAPAROTOMY/BILATERAL SALPINGO OOPHORECTOMY (Bilateral)  Patient Location: PACU  Anesthesia Type:General  Level of Consciousness: awake, alert , oriented and patient cooperative  Airway & Oxygen Therapy: Patient Spontanous Breathing and Patient connected to face mask oxygen  Post-op Assessment: Report given to RN, Post -op Vital signs reviewed and stable and Patient moving all extremities X 4  Post vital signs: stable  Last Vitals:  Filed Vitals:   02/06/15 1507  BP:   Pulse: 85  Temp: 36.9 C  Resp: 16    Complications: No apparent anesthesia complications

## 2015-02-06 NOTE — Anesthesia Postprocedure Evaluation (Signed)
  Anesthesia Post-op Note  Patient: Mercedes Gardner  Procedure(s) Performed: Procedure(s): EXPLORAROTORY LAPAROTOMY/BILATERAL SALPINGO OOPHORECTOMY (Bilateral)  Patient Location: PACU  Anesthesia Type:General  Level of Consciousness: awake and alert   Airway and Oxygen Therapy: Patient Spontanous Breathing  Post-op Pain: moderate  Post-op Assessment: Post-op Vital signs reviewed, Patient's Cardiovascular Status Stable and Respiratory Function Stable  Post-op Vital Signs: Reviewed  Filed Vitals:   02/06/15 1530  BP: 110/56  Pulse: 90  Temp:   Resp: 14    Complications: No apparent anesthesia complications

## 2015-02-06 NOTE — Anesthesia Preprocedure Evaluation (Addendum)
Anesthesia Evaluation  Patient identified by MRN, date of birth, ID band Patient awake    Reviewed: Allergy & Precautions, H&P , NPO status , Patient's Chart, lab work & pertinent test results  Airway Mallampati: II  TM Distance: >3 FB Neck ROM: Full    Dental no notable dental hx. (+) Edentulous Upper, Edentulous Lower, Dental Advisory Given   Pulmonary Current Smoker,    Pulmonary exam normal       Cardiovascular hypertension, Pt. on medications + dysrhythmias Rhythm:Regular Rate:Normal     Neuro/Psych Anxiety negative neurological ROS  negative psych ROS   GI/Hepatic Neg liver ROS, GERD-  Medicated and Controlled,  Endo/Other  negative endocrine ROS  Renal/GU negative Renal ROS  negative genitourinary   Musculoskeletal  (+) Arthritis -, Osteoarthritis,    Abdominal   Peds  Hematology negative hematology ROS (+)   Anesthesia Other Findings   Reproductive/Obstetrics negative OB ROS                           Anesthesia Physical Anesthesia Plan  ASA: II  Anesthesia Plan: General   Post-op Pain Management:    Induction: Intravenous  Airway Management Planned: Oral ETT  Additional Equipment:   Intra-op Plan:   Post-operative Plan: Extubation in OR  Informed Consent: I have reviewed the patients History and Physical, chart, labs and discussed the procedure including the risks, benefits and alternatives for the proposed anesthesia with the patient or authorized representative who has indicated his/her understanding and acceptance.   Dental advisory given  Plan Discussed with: CRNA  Anesthesia Plan Comments:         Anesthesia Quick Evaluation

## 2015-02-06 NOTE — Anesthesia Procedure Notes (Addendum)
Procedure Name: Intubation Date/Time: 02/06/2015 1:51 PM Performed by: Lissa Morales Pre-anesthesia Checklist: Patient identified, Emergency Drugs available, Suction available and Patient being monitored Patient Re-evaluated:Patient Re-evaluated prior to inductionOxygen Delivery Method: Circle System Utilized Preoxygenation: Pre-oxygenation with 100% oxygen Intubation Type: IV induction Ventilation: Mask ventilation without difficulty Laryngoscope Size: Miller and 2 Grade View: Grade II Tube type: Oral Tube size: 7.5 mm Number of attempts: 1 Airway Equipment and Method: Stylet and Oral airway Placement Confirmation: ETT inserted through vocal cords under direct vision,  positive ETCO2 and breath sounds checked- equal and bilateral Secured at: 21 cm Tube secured with: Tape Dental Injury: Teeth and Oropharynx as per pre-operative assessment

## 2015-02-06 NOTE — Op Note (Signed)
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   Anesthesia: General   Estimated blood loss: 33ml  IVF: 3000 ml   Urine output: 413 ml   Complications: None   Pathology: bilateral tubes and ovaries  Operative findings: 24 cm multiloculated cystic right ovarian mass filled with clear fluid. Normal tube and ovary on left.  Procedure: The patient was identified in the preoperative holding area. Informed consent was signed on the chart. Patient was seen history was reviewed and exam was performed.   The patient was then taken to the operating room and placed in the supine position with SCD hose on. General anesthesia was then induced without difficulty. She was then placed in the dorsolithotomy position. The abdomen was prepped with chlor prep sponges per protocol. Perineum was prepped with Betadine. The vagina was prepped with Betadine a Foley catheter was inserted into the bladder under sterile conditions.  The patient was then draped after the prep was dried. Timeout was performed the patient, procedure, antibiotic, allergy, and length of procedure. A vertical midline infraumbilical incision was and carried down to the underlying fascia using the scalpel. The fascia was scored in the fascial incision was extended superiorly and inferiorly using Bovie cautery. The rectus bellies were dissected off the overlying fascia. The peritoneum was tented and entered. The peritoneal incision was extended superiorly and inferiorly with visualization of the underlying peritoneal cavity. The  omental adhesions to the prior abdominal incision were taken down with the bovie. The Alexis self-retaining retractor was then placed.   The small and large bowel were packed out of the way using lap sponges. Abdominal pelvic washings were obtained. The ovarian cyst was decompressed of its fluid using a gallbladder trochar. This facilitated delivery of the ovary and tube on the right through the abdominal incision. A window was created in the right broad ligament above the level of the ureter to skeletonize the right IP ligament. Anderson clamps were placed across the right IP ligament which was transected and suture ligated. The utero-ovarian ligament on the right was also clamped, cut and suture ligated. Our attention was turned to the left side were similar procedure was performed in that the peritoneum lateral to the IP ligament was incised and the anterior and posterior leaves the broad ligament were opened. A window was made between the vessels and the ureter on the left and the vessels were clamped x2 transected and suture ligated. The utero-ovarian ligament was cross clamped, cut and suture ligated freeing the left ovary and tube.  At this point frozen section returned as benign mucinous neoplasm. The pedicles were noted to be hemostatic. The abdomen pelvis were copiously irrigated. The retractor and laparotomy sponges were removed. The fascia was closed using running mass closure of #1 PDS. The subcutaneous tissues were irrigated and made hemostatic. 20 mL of Exparel within 20 mL of normal saline was injected for postoperative pain control. The skin was closed using monocryl and dermabond.  All instrument, suture, laparotomy, Ray-Tec, and needle counts were correct x2. The patient tolerated the procedure well and was taken recovery room in stable condition. This is Everitt Amber dictating an operative note on Mercedes  U Gardner. Mercedes Eva, MD

## 2015-02-06 NOTE — H&P (Signed)
Assessment/Plan:  [name] is a 69 y.o. year old with a large pelvic mass which appears to be arising from the ovary. It is symptomatic. On CT scan it does not appear to be associated with other findings concerning for metastatic ovarian cancer.  I performed a history, physical examination, and personally reviewed the patient's imaging films including the CT abdomen and pelvis from [place].  I discussed with is broken that I am recommending an exploratory laparotomy with BSO and possible staging (including hysterectomy) if malignancy is identified our frozen section. I explained that I have a low suspicion that this is malignant given its large size, as ovarian cancers less commonly present with one large single dominant cystic mass (particularly of this size). However given its symptomatology and its size and the uncertainty of the potential cancer I recommend removal.  Discussed operative risks including bleeding, infection, damage to internal organs (such as bladder,ureters, bowels), blood clot, reoperation and rehospitalization. I discussed that these risks are elevated for smokers and encouraged her to quit preoperatively. Discussed anticipated length of stay and hospitalization and perioperative expectations.  We will draw a CA 125 today.  HPI: [name] is a 69 year old G3 P3 who is seen in consultation at the request of Dr.Avbuere for large pelvic abdominal mass. The patient reports having been seen by an urgent care approximately one year ago with abdominal distention and discomfort a pelvic exam was performed at that time and she reports a felt an ovarian cyst. No scans or imaging was performed at that time. No follow-up was performed for 1 year at which time the patient be presented to urgent care within increasing abdominal girth and increasing symptoms of back pain and abdominal attention. At this time a CT scan of the abdomen and pelvis was obtained which  revealed a 24 x 20 x 20 cm large cystic and septated mass arising from the right adnexa to the level of the subhepatic region. Multiple solid nodules were present within the mass. There is no lymphadenopathy or ascites identified. There is no perineal carcinomatosis.  She denies post menopausal bleeding. She is a family history of a mother with breast cancer in her 4s and 3 paternal aunts with breast cancer.  The patient is a smoker who smokes approximate one pack per week.   Current Meds:  Outpatient Encounter Prescriptions as of 12/26/2014  Medication Sig  . acetaminophen-codeine (TYLENOL #3) 300-30 MG per tablet Take 1-2 tablets by mouth every 6 (six) hours as needed for moderate pain.  [name] Kitchen amLODipine (NORVASC) 10 MG tablet Take 10 mg by mouth daily.   [name] Kitchen augmented betamethasone dipropionate (DIPROLENE-AF) 0.05 % cream Apply 1 application topically 2 (two) times daily.  . clotrimazole-betamethasone (LOTRISONE) cream Apply 1 application topically 2 (two) times daily. Apply to affected area 2 times daily prn  . colchicine 0.6 MG tablet Take 1 tablet (0.6 mg total) by mouth daily. (Patient taking differently: Take 0.6 mg by mouth 2 (two) times daily. )  . diclofenac sodium (VOLTAREN) 1 % GEL Apply 4 g topically 4 (four) times daily. To both knees, please instruct in dosing.  . fluticasone (FLONASE) 50 MCG/ACT nasal spray Place 2 sprays into both nostrils daily.  . hydrocortisone 2.5 % cream Apply 1 application topically daily as needed. For itching  . ibuprofen (ADVIL,MOTRIN) 200 MG tablet Take 200 mg by mouth every 6 (six) hours as needed for moderate pain.  . indomethacin (INDOCIN) 50 MG capsule Take 50 mg  by mouth 3 (three) times daily as needed.   . loratadine (CLARITIN) 10 MG tablet Take 10 mg by mouth daily.  [name] Kitchen omeprazole (PRILOSEC) 20 MG capsule Take 20 mg by mouth daily.   . [DISCONTINUED] hydrochlorothiazide (HYDRODIURIL) 25 MG tablet Take 1  tablet (25 mg total) by mouth daily.  . [DISCONTINUED] colchicine 0.6 MG tablet Take 0.6 mg by mouth daily.  . [DISCONTINUED] diclofenac (VOLTAREN) 75 MG EC tablet Take 1 tablet (75 mg total) by mouth 2 (two) times daily.  . [DISCONTINUED] fluconazole (DIFLUCAN) 150 MG tablet Take 1 tablet (150 mg total) by mouth once. Repeat in 3 days if needed  . [DISCONTINUED] hydrochlorothiazide (HYDRODIURIL) 25 MG tablet Take 25 mg by mouth daily.   . [DISCONTINUED] methocarbamol (ROBAXIN) 500 MG tablet Take 1-2 tablets (500-1,000 mg total) by mouth every 6 (six) hours as needed for muscle spasms.  . [DISCONTINUED] terconazole (TERAZOL 3) 80 MG vaginal suppository Place 1 suppository (80 mg total) vaginally at bedtime.  . [DISCONTINUED] traMADol (ULTRAM) 50 MG tablet Take 50 mg by mouth every 6 (six) hours as needed for moderate pain.    Allergy:  Allergies  Allergen Reactions  . Tramadol     Per pt, she got depressed, moody, and had increased back pain when she took tramadol    Social Hx:  History   Social History  . Marital Status: Divorced    Spouse Name: N/A  . Number of Children: N/A  . Years of Education: N/A   Occupational History  . Not on file.   Social History Main Topics  . Smoking status: Current Some Day Smoker -- 0.02 packs/day    Types: Cigarettes  . Smokeless tobacco: Never Used  . Alcohol Use: No  . Drug Use: No  . Sexual Activity: No   Other Topics Concern  . Not on file   Social History Narrative    Past Surgical Hx:  Past Surgical History  Procedure Laterality Date  . Cesarean section      x3  . Ovarian cyst surgery Right 1980's    Past Medical Hx:  Past Medical History  Diagnosis Date  . Hypertension   . Gout   . Hyperlipidemia April 2014  . Anxiety     Past Gynecological History: Cesarean section x 3 No LMP recorded. Patient is  postmenopausal.  Family Hx:  Family History  Problem Relation Age of Onset  . Cancer Mother   . Hypertension Mother   . Diabetes Mother   . Diabetes Sister   . Hypertension Sister     Review of Systems:  Constitutional  Feels well,  ENT Normal appearing ears and nares bilaterally Skin/Breast  No rash, sores, jaundice, itching, dryness Cardiovascular  No chest pain, shortness of breath, or edema  Pulmonary  No cough or wheeze.  Gastro Intestinal  No nausea, vomitting, or diarrhoea. No bright red blood per rectum, no abdominal pain, change in bowel movement, or constipation.  Genito Urinary  No frequency, urgency, dysuria,  Musculo Skeletal  No myalgia, arthralgia, joint swelling or pain  Neurologic  No weakness, numbness, change in gait,  Psychology  No depression, anxiety, insomnia.   Vitals: Blood pressure 144/90, pulse 90, temperature 98.2 F (36.8 C), temperature source Oral, resp. rate 20, height 5\' 5"  (1.651 m), weight 151 lb 4.8 oz (68.629 kg).  Physical Exam: WD in NAD Neck  Supple NROM, without any enlargements.  Lymph Node Survey No cervical supraclavicular or inguinal adenopathy Cardiovascular  Pulse normal  rate, regularity and rhythm. S1 and S2 normal.  Lungs  Clear to auscultation bilateraly, without wheezes/crackles/rhonchi. Good air movement.  Skin  No rash/lesions/breakdown  Psychiatry  Alert and oriented to person, place, and time  Abdomen  Normoactive bowel sounds, abdomen soft, Distended with cystic mass on mid and right upper abdomen. non-tender and nonobese without evidence of hernia.  Back No CVA tenderness Genito Urinary  Vulva/vagina: Normal external female genitalia. No lesions. No discharge or bleeding. Bladder/urethra: No lesions or masses, well supported bladder Vagina: normal and smooth Cervix: Normal appearing, no  lesions. Uterus: Small, mobile, no parametrial involvement or nodularity. Adnexa: Large abdomino-pelvic smooth mass reaching xiphoid. Rectal  Good tone, no masses no cul de sac nodularity.  Extremities  No bilateral cyanosis, clubbing or edema.   Donaciano Eva, MD

## 2015-02-07 ENCOUNTER — Encounter (HOSPITAL_COMMUNITY): Payer: Self-pay | Admitting: Gynecologic Oncology

## 2015-02-07 MED ORDER — IBUPROFEN 800 MG PO TABS: 800.0000 mg | ORAL_TABLET | Freq: Three times a day (TID) | ORAL | Status: DC

## 2015-02-07 MED ORDER — "GAUZE DRESSING 4""X4"" PADS": 1.0000 | MEDICATED_PAD | Freq: Two times a day (BID) | Status: DC

## 2015-02-07 MED ORDER — DEXTROMETHORPHAN-GUAIFENESIN 10-100 MG/5ML PO LIQD
5.0000 mL | ORAL | Status: DC | PRN
  Filled 2015-02-07: qty 5

## 2015-02-07 MED ORDER — MAGNESIUM HYDROXIDE 400 MG/5ML PO SUSP: 30.0000 mL | Freq: Three times a day (TID) | ORAL | Status: DC

## 2015-02-07 MED ORDER — ACETAMINOPHEN 500 MG PO TABS: 1000.0000 mg | ORAL_TABLET | Freq: Four times a day (QID) | ORAL | Status: DC

## 2015-02-07 MED ORDER — OXYCODONE HCL 5 MG PO TABS: 5.0000 mg | ORAL_TABLET | ORAL | Status: DC | PRN

## 2015-02-07 MED ORDER — DEXTROMETHORPHAN-GUAIFENESIN 10-100 MG/5ML PO LIQD: 5.0000 mL | ORAL | Status: DC | PRN

## 2015-02-07 MED ORDER — CANE MISC: 1.0000 | Freq: Once | Status: DC

## 2015-02-07 NOTE — Discharge Instructions (Signed)
02/07/2015  Return to work: 4 weeks  Activity: 1. Be up and out of the bed during the day.  Take a nap if needed.  You may walk up steps but be careful and use the hand rail.  Stair climbing will tire you more than you think, you may need to stop part way and rest.   2. No lifting or straining for 4 weeks.  3. No driving for 2 weeks.  Do Not drive if you are taking narcotic pain medicine.  4. Shower daily.  Use soap and water on your incision and pat dry; don't rub.   5. No sexual activity and nothing in the vagina for 4 weeks.  Diet: 1. Low sodium Heart Healthy Diet is recommended.  2. It is safe to use a laxative if you have difficulty moving your bowels.   Wound Care: 1. Keep clean and dry.  Shower daily.  Reasons to call the Doctor:   Fever - Oral temperature greater than 100.4 degrees Fahrenheit  Foul-smelling vaginal discharge  Difficulty urinating  Nausea and vomiting  Increased pain at the site of the incision that is unrelieved with pain medicine.  Difficulty breathing with or without chest pain  New calf pain especially if only on one side  Sudden, continuing increased vaginal bleeding with or without clots.   Follow-up: 1. See Everitt Amber in 3 weeks.  Contacts: For questions or concerns you should contact:  Dr. Everitt Amber at [phone]  or at [place]

## 2015-02-07 NOTE — Progress Notes (Signed)
PT Cancellation Note  Patient Details Name: Mercedes Gardner MRN: [id number] DOB: 1946-02-28   Cancelled Treatment:    Reason Eval/Treat Not Completed: PT screened, no needs identified, will sign off   Claretha Cooper 02/07/2015, 1:27 PM Tresa Endo PT [phone]

## 2015-02-07 NOTE — Care Management Note (Signed)
Case Management Note  Patient Details  Name: COILA WARDELL MRN: [id number] Date of Birth: [birth year]  Subjective/Objective:       Admitted s/p exploratory lap with BSO             Action/Plan: Discharge planning  Expected Discharge Date:                  Expected Discharge Plan:  Home/Self Care  In-House Referral:  NA  Discharge planning Services  CM Consult  Post Acute Care Choice:    Choice offered to:     DME Arranged:    DME Agency:     HH Arranged:    [place] Agency:     Status of Service:  Completed, signed off  Medicare Important Message Given:  N/A - LOS <3 / Initial given by admissions Date Medicare IM Given:    Medicare IM give by:    Date Additional Medicare IM Given:    Additional Medicare Important Message give by:     If discussed at [place] of Stay Meetings, dates discussed:    Additional Comments:  Guadalupe Maple, RN 02/07/2015, 10:42 AM

## 2015-02-07 NOTE — Discharge Summary (Signed)
Physician Discharge Summary  Patient ID: Mercedes Gardner MRN: [id number] DOB/AGE: 1946-03-27 69 y.o.  Admit date: 02/06/2015 Discharge date: 02/07/2015  Admission Diagnoses: <principal problem not specified>  Discharge Diagnoses:  Active Problems:   Pelvic mass in female   Discharged Condition: good  Hospital Course: Patient was admitted for surgery on 02/06/15 for a mini-lap, BSO. She did well, pathology was benign on the right ovarian cyst. Postop she noted increased Right knee pain from her pre-existing arthritis.  Consults: PT  Significant Diagnostic Studies: none  Treatments: therapies: PT  Discharge Exam: Blood pressure 108/64, pulse 72, temperature 98.7 F (37.1 C), temperature source Oral, resp. rate 16, height 5\' 4"  (1.626 m), weight 151 lb (68.493 kg), SpO2 99 %. General appearance: alert and cooperative Resp: clear to auscultation bilaterally Cardio: regular rate and rhythm, S1, S2 normal, no murmur, click, rub or gallop GI: soft, non-tender; bowel sounds normal; no masses,  no organomegaly Extremities: right knee normal to palpate and inspect. No deformity. Patient able to elevated leg with normal strength. Neurologic: Grossly normal, No sensory deficits in right leg Incision/Wound: opened in superior 3cm, non-draining.  Disposition: 01-Home or Self Care  Discharge Instructions    (HEART FAILURE PATIENTS) Call MD:  Anytime you have any of the following symptoms: 1) 3 pound weight gain in 24 hours or 5 pounds in 1 week 2) shortness of breath, with or without a dry hacking cough 3) swelling in the hands, feet or stomach 4) if you have to sleep on extra pillows at night in order to breathe.    Complete by:  As directed      Call MD for:  difficulty breathing, headache or visual disturbances    Complete by:  As directed      Call MD for:  extreme fatigue    Complete by:  As directed      Call MD for:  hives    Complete by:  As directed      Call MD for:  persistant  dizziness or light-headedness    Complete by:  As directed      Call MD for:  persistant nausea and vomiting    Complete by:  As directed      Call MD for:  redness, tenderness, or signs of infection (pain, swelling, redness, odor or green/yellow discharge around incision site)    Complete by:  As directed      Call MD for:  severe uncontrolled pain    Complete by:  As directed      Call MD for:  temperature >100.4    Complete by:  As directed      Diet - low sodium heart healthy    Complete by:  As directed      Diet general    Complete by:  As directed      Driving Restrictions    Complete by:  As directed   No driving for 7 days or until off narcotic pain medication     Increase activity slowly    Complete by:  As directed      Remove dressing in 24 hours    Complete by:  As directed      Sexual Activity Restrictions    Complete by:  As directed   No intercourse for 6 weeks            Medication List    STOP taking these medications        acetaminophen-codeine 300-30 MG per  tablet  Commonly known as:  TYLENOL #3      TAKE these medications        acetaminophen 500 MG tablet  Commonly known as:  TYLENOL  Take 2 tablets (1,000 mg total) by mouth every 6 (six) hours.     amLODipine 10 MG tablet  Commonly known as:  NORVASC  Take 10 mg by mouth every morning.     Cane Misc  1 Device by Does not apply route once.     colchicine 0.6 MG tablet  Take 1 tablet (0.6 mg total) by mouth daily.     dextromethorphan-guaiFENesin 10-100 MG/5ML liquid  Commonly known as:  ROBITUSSIN-DM  Take 5 mLs by mouth every 4 (four) hours as needed for cough.     fexofenadine-pseudoephedrine 60-120 MG per tablet  Commonly known as:  ALLEGRA-D  Take 1 tablet by mouth 2 (two) times daily as needed (ALLERGIES).     fluconazole 150 MG tablet  Commonly known as:  DIFLUCAN  Take 150 mg by mouth daily as needed.     fluticasone 50 MCG/ACT nasal spray  Commonly known as:  FLONASE   Place 2 sprays into both nostrils 2 (two) times daily.     Gauze Dressing 4"X4" Pads  1 packet by Does not apply route 2 (two) times daily.     ibuprofen 800 MG tablet  Commonly known as:  ADVIL,MOTRIN  Take 1 tablet (800 mg total) by mouth 3 (three) times daily.     indomethacin 50 MG capsule  Commonly known as:  INDOCIN  Take 50 mg by mouth 3 (three) times daily as needed for mild pain.     loratadine 10 MG tablet  Commonly known as:  CLARITIN  Take 10 mg by mouth every morning.     magnesium hydroxide 400 MG/5ML suspension  Commonly known as:  MILK OF MAGNESIA  Take 30 mLs by mouth every 8 (eight) hours.     NICORETTE 4 MG gum  Generic drug:  nicotine polacrilex  Take 4 mg by mouth 3 (three) times daily.     omeprazole 20 MG capsule  Commonly known as:  PRILOSEC  Take 20 mg by mouth every morning.     OVER THE COUNTER MEDICATION  Place 2 drops into both eyes daily as needed (DRY EYES, RED EYES).     oxyCODONE 5 MG immediate release tablet  Commonly known as:  Oxy IR/ROXICODONE  Take 1 tablet (5 mg total) by mouth every 4 (four) hours as needed for severe pain or breakthrough pain.      ASK your doctor about these medications        diclofenac sodium 1 % Gel  Commonly known as:  VOLTAREN  Apply 4 g topically 4 (four) times daily. To both knees, please instruct in dosing.           Follow-up Information    Follow up with Donaciano Eva, MD In 3 weeks.   Specialty:  Obstetrics and Gynecology   Contact information:   [place] [id number] [phone]       Signed: Donaciano Eva 02/07/2015, 10:02 AM

## 2015-02-07 NOTE — Progress Notes (Signed)
1 Day Post-Op Procedure(s) (LRB): EXPLORAROTORY LAPAROTOMY/BILATERAL SALPINGO OOPHORECTOMY (Bilateral)  Subjective: Patient reports no pain, passing flatus, tolerating PO. Superior aspect of wound opened last night.  Objective: Vital signs in last 24 hours: Temp:  [97.7 F (36.5 C)-98.8 F (37.1 C)] 98.7 F (37.1 C) (05/27 0430) Pulse Rate:  [72-94] 72 (05/27 0430) Resp:  [13-20] 16 (05/27 0430) BP: (95-133)/(44-81) 108/64 mmHg (05/27 0430) SpO2:  [97 %-100 %] 99 % (05/27 0430) Weight:  [151 lb (68.493 kg)] 151 lb (68.493 kg) (05/26 1252) Last BM Date: 02/05/15  Intake/Output from previous day: 05/26 0701 - 05/27 0700 In: 2573.3 [P.O.:120; I.V.:2453.3] Out: 3325 [Urine:3275; Blood:50]  Physical Examination: General: alert and cooperative Resp: clear to auscultation bilaterally Cardio: regular rate and rhythm, S1, S2 normal, no murmur, click, rub or gallop GI: soft, non-tender; bowel sounds normal; no masses,  no organomegaly and incision: clean and 3cm area at superior aspect of wound open, dry, nondraining with guaze in place Extremities: extremities normal, atraumatic, no cyanosis or edema Vaginal Bleeding: none  Labs: WBC/Hgb/Hct/Plts:  9.9/11.4/35.8/178 (05/26 1700) BUN/Cr/glu/ALT/AST/amyl/lip:  9/0.77/--/--/--/--/-- (05/26 1700)   Assessment:  69 y.o. s/p Procedure(s): EXPLORAROTORY LAPAROTOMY/BILATERAL SALPINGO OOPHORECTOMY: stable Pain:  Pain is well-controlled on oral medications.  Heme:Hb appropriate postop  ID: no issues  CV: hmildly elevated. Resume home meds  GI:  Tolerating po: Yes   FEN: d.c. IVF.  Prophylaxis: pharmacologic prophylaxis (with any of the following: enoxaparin (Lovenox) 40mg  SQ 2 hours prior to surgery then every day). Musculoskeletal: patient has right knee arthritis which is worse postop. Making it hardto ambulate. Recommend PT consult. Prescribed walking aid. Plan: Advance diet Dispo:  Discharge plan to include :consults: PT, The  patient is to be discharged to home after PT consult. Plan on discharge later today.   LOS: 1 day    Donaciano Eva 02/07/2015, 9:57 AM

## 2015-02-18 ENCOUNTER — Other Ambulatory Visit: Payer: Self-pay | Admitting: Gynecologic Oncology

## 2015-02-18 ENCOUNTER — Telehealth: Payer: Self-pay | Admitting: *Deleted

## 2015-02-18 DIAGNOSIS — G8918 Other acute postprocedural pain: Secondary | ICD-10-CM

## 2015-02-18 DIAGNOSIS — R1900 Intra-abdominal and pelvic swelling, mass and lump, unspecified site: Secondary | ICD-10-CM

## 2015-02-18 MED ORDER — OXYCODONE HCL 5 MG PO TABS: 5.0000 mg | ORAL_TABLET | ORAL | Status: DC | PRN

## 2015-02-18 NOTE — Progress Notes (Signed)
Patient called requesting refill on pain medication.  Stating she is taking the oxycodone every four hours as needed for pain.  Doing well post-operatively.  Refill given to last her to her follow up appt.  Advised to only take as needed and to alternate with tylenol and ibuprofen as well.  Advised to call for any questions or concerns.

## 2015-02-18 NOTE — Telephone Encounter (Signed)
Patient called requesting refill on Oxy IR 5mg  tablets. She states she will be out of medication tomorrow. Told patient I will return her call once the prescription is ready to be picked up.  Returned call to pt and notified her that script is ready to be picked up at [facility]. Reminded patient that she must bring a photo ID when picking up prescription - patient agreeable to this. Reminded patient to alternate Oxy IR with Tylenol or ibuprofen. No other questions or concerns voiced at this time.

## 2015-02-26 ENCOUNTER — Encounter: Payer: Self-pay | Admitting: Gynecologic Oncology

## 2015-02-26 ENCOUNTER — Ambulatory Visit: Payer: Medicare Other | Attending: Gynecologic Oncology | Admitting: Gynecologic Oncology

## 2015-02-26 VITALS — BP 138/67 | HR 90 | Temp 98.7°F | Resp 18 | Ht 64.0 in | Wt 143.3 lb

## 2015-02-26 DIAGNOSIS — Z8742 Personal history of other diseases of the female genital tract: Secondary | ICD-10-CM | POA: Diagnosis not present

## 2015-02-26 DIAGNOSIS — Z90722 Acquired absence of ovaries, bilateral: Secondary | ICD-10-CM | POA: Insufficient documentation

## 2015-02-26 DIAGNOSIS — L298 Other pruritus: Secondary | ICD-10-CM | POA: Insufficient documentation

## 2015-02-26 DIAGNOSIS — N8329 Other ovarian cysts: Secondary | ICD-10-CM | POA: Diagnosis not present

## 2015-02-26 DIAGNOSIS — R1900 Intra-abdominal and pelvic swelling, mass and lump, unspecified site: Secondary | ICD-10-CM

## 2015-02-26 DIAGNOSIS — N838 Other noninflammatory disorders of ovary, fallopian tube and broad ligament: Secondary | ICD-10-CM

## 2015-02-26 DIAGNOSIS — N898 Other specified noninflammatory disorders of vagina: Secondary | ICD-10-CM | POA: Diagnosis not present

## 2015-02-26 DIAGNOSIS — Z483 Aftercare following surgery for neoplasm: Secondary | ICD-10-CM | POA: Insufficient documentation

## 2015-02-26 DIAGNOSIS — B373 Candidiasis of vulva and vagina: Secondary | ICD-10-CM | POA: Diagnosis not present

## 2015-02-26 MED ORDER — FLUCONAZOLE 100 MG PO TABS: 100.0000 mg | ORAL_TABLET | Freq: Every day | ORAL | Status: DC

## 2015-02-26 MED ORDER — NICORETTE 4 MG MT GUM: 4.0000 mg | CHEWING_GUM | Freq: Three times a day (TID) | OROMUCOSAL | Status: DC

## 2015-02-26 MED ORDER — "GAUZE DRESSING 4""X4"" PADS": 1.0000 | MEDICATED_PAD | Freq: Two times a day (BID) | Status: DC

## 2015-02-26 MED ORDER — BACITRACIN-NEOMYCIN-POLYMYXIN 400-5-5000 EX OINT: 1.0000 "application " | TOPICAL_OINTMENT | Freq: Two times a day (BID) | CUTANEOUS | Status: DC

## 2015-02-26 NOTE — Progress Notes (Signed)
POSTOP FOLLOWUP  HPI:  Mercedes Gardner is a 69 y.o. year old G4P3010 initially seen in consultation on 12/26/14 for an ovarian mass.  She then underwent a exploratory laparotomy and BSO on 7/41/28 without complications.  Her postoperative course was complicated by mild upper wound separation.  Her final pathology revealed benign ovarian cyst and benign fallopian tube cyst.  She is seen today for a postoperative check and to discuss her pathology results and ongoing plan.  Since discharge from the hospital, she is feeling overall well, she requests addition wound supplies. She has vaginal pruritis consistent with yeast infection.  She has improving appetite, normal bowel and bladder function, and pain controlled with minimal PO medication. She has no other complaints today.    Review of systems: Constitutional:  She has no weight gain or weight loss. She has no fever or chills. Eyes: No blurred vision Ears, Nose, Mouth, Throat: No dizziness, headaches or changes in hearing. No mouth sores. Cardiovascular: No chest pain, palpitations or edema. Respiratory:  No shortness of breath, wheezing or cough Gastrointestinal: She has normal bowel movements without diarrhea or constipation. She denies any nausea or vomiting. She denies blood in her stool or heart burn. Genitourinary:  She denies pelvic pain, pelvic pressure or changes in her urinary function. She has no hematuria, dysuria, or incontinence. She has no irregular vaginal bleeding or vaginal discharge Musculoskeletal: Denies muscle weakness or joint pains.  Skin:  She has no skin changes, rashes or itching Neurological:  Denies dizziness or headaches. No neuropathy, no numbness or tingling. Psychiatric:  She denies depression or anxiety. Hematologic/Lymphatic:   No easy bruising or bleeding   Physical Exam: Blood pressure 138/67, pulse 90, temperature 98.7 F (37.1 C), temperature source Oral, resp. rate 18, height 5\' 4"  (1.626 m), weight 143  lb 4.8 oz (65 kg), SpO2 100 %. General: Well dressed, well nourished in no apparent distress.   HEENT:  Normocephalic and atraumatic, no lesions.  Extraocular muscles intact. Sclerae anicteric. Pupils equal, round, reactive. No mouth sores or ulcers. Thyroid is normal size, not nodular, midline. Skin:  No lesions or rashes. Lungs:  Clear to auscultation bilaterally.  No wheezes. Cardiovascular:  Regular rate and rhythm.  No murmurs or rubs. Abdomen:  Soft, nontender, nondistended.  No palpable masses.  No hepatosplenomegaly.  No ascites. Normal bowel sounds.  No hernias.  Incision is healing well with granulation tissue at open 3cm of wound (no depth to the opening) Genitourinary: Normal EGBUS  Vaginal cuff intact.  No bleeding or discharge.  No cul de sac fullness. Extremities: No cyanosis, clubbing or edema.  No calf tenderness or erythema. No palpable cords. Psychiatric: Mood and affect are appropriate. Neurological: Awake, alert and oriented x 3. Sensation is intact, no neuropathy.  Musculoskeletal: No pain, normal strength and range of motion.  Assessment:    69 y.o. year old with benign brenner ovarian cyst.   S/p ex lap and BSO on 02/06/15.   Plan: 1) Pathology reports reviewed today 2) Treatment counseling - she requires no followup for these ovarian cysts. She was given the opportunity to ask questions, which were answered to her satisfaction, and she is agreement with the above mentioned plan of care. 3) diflucan for vaginal yeast 4)  Return to clinic prn basis only  Donaciano Eva, MD

## 2015-02-26 NOTE — Patient Instructions (Signed)
Continue caring for the incision.  Call for the development of any drainage or redness or if the wound if not healing.  Please call for any questions or concerns.

## 2015-02-28 ENCOUNTER — Ambulatory Visit: Payer: Medicare Other | Admitting: Gynecologic Oncology

## 2015-03-03 ENCOUNTER — Ambulatory Visit: Payer: Medicare Other | Admitting: Gynecologic Oncology

## 2015-03-04 ENCOUNTER — Other Ambulatory Visit: Payer: Self-pay | Admitting: Gynecologic Oncology

## 2015-04-21 ENCOUNTER — Encounter (HOSPITAL_COMMUNITY): Payer: Self-pay | Admitting: Emergency Medicine

## 2015-04-21 ENCOUNTER — Emergency Department (INDEPENDENT_AMBULATORY_CARE_PROVIDER_SITE_OTHER)
Admission: EM | Admit: 2015-04-21 | Discharge: 2015-04-21 | Disposition: A | Discharge status: Home / Self Care | Payer: Medicare Other | Source: Home / Self Care | Attending: Family Medicine | Admitting: Family Medicine

## 2015-04-21 DIAGNOSIS — B86 Scabies: Secondary | ICD-10-CM | POA: Diagnosis not present

## 2015-04-21 MED ORDER — PERMETHRIN 5 % EX CREA: TOPICAL_CREAM | CUTANEOUS | Status: DC

## 2015-04-21 NOTE — ED Notes (Signed)
Patient reports starting a antidepressant medicine one month ago.  2 days after starting medicine noted rash to hands/ankles.  pcp instructed patient to stop medicine.  Patient reports continued rash and itching.

## 2015-04-21 NOTE — ED Provider Notes (Signed)
CSN: [id number]     Arrival date & time 04/21/15  1505 History   First MD Initiated Contact with Patient 04/21/15 1713     Chief Complaint  Patient presents with  . Rash   (Consider location/radiation/quality/duration/timing/severity/associated sxs/prior Treatment) Patient is a 69 y.o. female presenting with rash. The history is provided by the patient.  Rash Location:  Hand and foot Hand rash location:  R hand and L hand Foot rash location:  R foot and L foot Quality: itchiness   Severity:  Mild Onset quality:  Gradual Duration:  1 month Progression:  Spreading Chronicity:  New Context comment:  Pt feels it is after starting new medicine for anxiety.   Past Medical History  Diagnosis Date  . Hypertension   . Gout   . Hyperlipidemia April 2014  . Anxiety   . Dysrhythmia     heart skips a beat   . Shortness of breath dyspnea     due to pressure of cyst per patient   . GERD (gastroesophageal reflux disease)   . Arthritis     knees   . Anemia    Past Surgical History  Procedure Laterality Date  . Cesarean section      x3  . Ovarian cyst surgery Right 1980's  . Salpingoophorectomy Bilateral 02/06/2015    Procedure: Campbell Stall LAPAROTOMY/BILATERAL SALPINGO OOPHORECTOMY;  Surgeon: Everitt Amber, MD;  Location: [facility];  Service: Gynecology;  Laterality: Bilateral;   Family History  Problem Relation Age of Onset  . Cancer Mother   . Hypertension Mother   . Diabetes Mother   . Diabetes Sister   . Hypertension Sister    History  Substance Use Topics  . Smoking status: Current Some Day Smoker -- 0.02 packs/day    Types: Cigarettes  . Smokeless tobacco: Never Used     Comment: 2 cigarettes per pday on 01/31/15    . Alcohol Use: No   OB History    Gravida Para Term Preterm AB TAB SAB Ectopic Multiple Living   4 3 3  1           Review of Systems  Skin: Positive for rash.    Allergies  Tramadol  Home Medications   Prior to Admission medications   Medication  Sig Start Date End Date Taking? Authorizing Provider  acetaminophen (TYLENOL) 500 MG tablet Take 2 tablets (1,000 mg total) by mouth every 6 (six) hours. 02/07/15   Everitt Amber, MD  acetaminophen-codeine (TYLENOL #3) 300-30 MG per tablet  02/20/15   Historical Provider, MD  amLODipine (NORVASC) 10 MG tablet Take 10 mg by mouth every morning.     Historical Provider, MD  colchicine 0.6 MG tablet Take 1 tablet (0.6 mg total) by mouth daily. Patient taking differently: Take 0.6 mg by mouth 2 (two) times daily.  05/17/14   Etta Quill, NP  dextromethorphan-guaiFENesin (ROBITUSSIN-DM) 10-100 MG/5ML liquid Take 5 mLs by mouth every 4 (four) hours as needed for cough. Patient not taking: Reported on 02/26/2015 02/07/15   Everitt Amber, MD  diclofenac sodium (VOLTAREN) 1 % GEL Apply 4 g topically 4 (four) times daily. To both knees, please instruct in dosing. Patient taking differently: Apply 4 g topically 4 (four) times daily as needed (PAIN APPLIES TO BOTH KNEES).  10/13/14   Billy Fischer, MD  fexofenadine-pseudoephedrine (ALLEGRA-D) 60-120 MG per tablet Take 1 tablet by mouth 2 (two) times daily as needed (ALLERGIES).    Historical Provider, MD  fluconazole (DIFLUCAN) 100 MG tablet Take  1 tablet (100 mg total) by mouth daily. 02/26/15   Everitt Amber, MD  fluticasone (FLONASE) 50 MCG/ACT nasal spray Place 2 sprays into both nostrils 2 (two) times daily.  04/17/14   Audelia Hives Presson, PA  Gauze Pads & Dressings (GAUZE DRESSING) 4"X4" PADS 1 packet by Does not apply route 2 (two) times daily. 02/07/15   Everitt Amber, MD  Gauze Pads & Dressings (GAUZE DRESSING) 4"X4" PADS 1 Device by Does not apply route 2 (two) times daily. 02/26/15   Everitt Amber, MD  ibuprofen (ADVIL,MOTRIN) 800 MG tablet Take 1 tablet (800 mg total) by mouth 3 (three) times daily. 02/07/15   Everitt Amber, MD  indomethacin (INDOCIN) 50 MG capsule Take 50 mg by mouth 3 (three) times daily as needed for mild pain.  12/11/14   Historical Provider, MD  loratadine  (CLARITIN) 10 MG tablet Take 10 mg by mouth every morning.  04/17/14   Audelia Hives Presson, PA  magnesium hydroxide (MILK OF MAGNESIA) 400 MG/5ML suspension Take 30 mLs by mouth every 8 (eight) hours. 02/07/15   Everitt Amber, MD  Misc. Devices (CANE) MISC 1 Device by Does not apply route once. 02/07/15   Everitt Amber, MD  neomycin-bacitracin-polymyxin (NEOSPORIN) ointment Apply 1 application topically every 12 (twelve) hours. apply to abdomnal wound 02/26/15   Everitt Amber, MD  NICORETTE 4 MG gum Take 1 each (4 mg total) by mouth 3 (three) times daily. 02/26/15   Everitt Amber, MD  omeprazole (PRILOSEC) 20 MG capsule Take 20 mg by mouth every morning.  12/10/14   Historical Provider, MD  OVER THE COUNTER MEDICATION Place 2 drops into both eyes daily as needed (DRY EYES, RED EYES).    Historical Provider, MD  oxyCODONE (OXY IR/ROXICODONE) 5 MG immediate release tablet Take 1 tablet (5 mg total) by mouth every 4 (four) hours as needed for severe pain. Do not take and drive 01/20/92   Dorothyann Gibbs, NP  permethrin (ELIMITE) 5 % cream Apply over entire body except head, at bedtime, wash off in am and repeat in 1 week. 04/21/15   Billy Fischer, MD   There were no vitals taken for this visit. Physical Exam  Constitutional: She is oriented to person, place, and time. She appears well-developed and well-nourished. She appears distressed.  Neurological: She is alert and oriented to person, place, and time.  Skin: Skin is warm and dry. Rash noted.  Excoriated papular rash on hands, feet , wrists, ankles.  Nursing note and vitals reviewed.   ED Course  Procedures (including critical care time) Labs Review Labs Reviewed - No data to display  Imaging Review No results found.   MDM   1. Scabies        Billy Fischer, MD 04/21/15 [phone]

## 2015-06-04 ENCOUNTER — Encounter (HOSPITAL_COMMUNITY): Payer: Self-pay | Admitting: Emergency Medicine

## 2015-06-04 ENCOUNTER — Emergency Department (INDEPENDENT_AMBULATORY_CARE_PROVIDER_SITE_OTHER)
Admission: EM | Admit: 2015-06-04 | Discharge: 2015-06-04 | Disposition: A | Discharge status: Home / Self Care | Payer: Medicare Other | Source: Home / Self Care | Attending: Emergency Medicine | Admitting: Emergency Medicine

## 2015-06-04 DIAGNOSIS — J0140 Acute pansinusitis, unspecified: Secondary | ICD-10-CM | POA: Diagnosis not present

## 2015-06-04 DIAGNOSIS — B373 Candidiasis of vulva and vagina: Secondary | ICD-10-CM | POA: Diagnosis not present

## 2015-06-04 DIAGNOSIS — L301 Dyshidrosis [pompholyx]: Secondary | ICD-10-CM | POA: Diagnosis not present

## 2015-06-04 DIAGNOSIS — B3731 Acute candidiasis of vulva and vagina: Secondary | ICD-10-CM

## 2015-06-04 LAB — POCT URINALYSIS DIP (DEVICE)
BILIRUBIN URINE: NEGATIVE
Glucose, UA: NEGATIVE mg/dL
KETONES UR: NEGATIVE mg/dL
LEUKOCYTES UA: NEGATIVE
Nitrite: NEGATIVE
Protein, ur: 100 mg/dL — AB
SPECIFIC GRAVITY, URINE: 1.025 (ref 1.005–1.030)
UROBILINOGEN UA: 1 mg/dL (ref 0.0–1.0)
pH: 6.5 (ref 5.0–8.0)

## 2015-06-04 MED ORDER — PREDNISONE 50 MG PO TABS: ORAL_TABLET | ORAL | Status: DC

## 2015-06-04 MED ORDER — CLOBETASOL PROPIONATE 0.05 % EX OINT: 1.0000 "application " | TOPICAL_OINTMENT | Freq: Two times a day (BID) | CUTANEOUS | Status: DC

## 2015-06-04 MED ORDER — FLUCONAZOLE 100 MG PO TABS: 100.0000 mg | ORAL_TABLET | Freq: Every day | ORAL | Status: DC

## 2015-06-04 MED ORDER — AZITHROMYCIN 250 MG PO TABS: ORAL_TABLET | ORAL | Status: DC

## 2015-06-04 NOTE — Discharge Instructions (Signed)
Your sinuses are inflamed. Take azithromycin and prednisone as prescribed.  When you have finished the azithromycin, take Diflucan daily for 1 week.  Use the clobetasol twice a day on your hands. After you apply it at bedtime, cover your hands with gloves to help it penetrate the skin better.  Follow-up as needed.

## 2015-06-04 NOTE — ED Provider Notes (Signed)
CSN: [id number]     Arrival date & time 06/04/15  1442 History   First MD Initiated Contact with Patient 06/04/15 1708     Chief Complaint  Patient presents with  . Rash  . Vaginitis  . Facial Pain   (Consider location/radiation/quality/duration/timing/severity/associated sxs/prior Treatment) HPI  Mercedes Gardner is a 69 year old woman here for several concerns.  Mercedes Gardner reports sinus pressure over the last 2 weeks. It has gradually been worsening. Mercedes Gardner also reports nasal congestion. No fevers or chills. Denies postnasal drainage or sore throat. Over the last few days Mercedes Gardner has started having some dysequilibrium on first standing. It resolves within a few seconds.  Mercedes Gardner also reports vaginal itching over the last several days. There is a thick white discharge associated with it. Mercedes Gardner states this is typical of her yeast infections.  Mercedes Gardner also reports a scaly rash on her palms. Mercedes Gardner has had this before, and it started coming back over the last week or so. It is somewhat itchy.  Past Medical History  Diagnosis Date  . Hypertension   . Gout   . Hyperlipidemia April 2014  . Anxiety   . Dysrhythmia     heart skips a beat   . Shortness of breath dyspnea     due to pressure of cyst per patient   . GERD (gastroesophageal reflux disease)   . Arthritis     knees   . Anemia    Past Surgical History  Procedure Laterality Date  . Cesarean section      x3  . Ovarian cyst surgery Right 1980's  . Salpingoophorectomy Bilateral 02/06/2015    Procedure: [facility] Stall LAPAROTOMY/BILATERAL SALPINGO OOPHORECTOMY;  Surgeon: Everitt Amber, MD;  Location: [facility];  Service: Gynecology;  Laterality: Bilateral;   Family History  Problem Relation Age of Onset  . Cancer Mother   . Hypertension Mother   . Diabetes Mother   . Diabetes Sister   . Hypertension Sister    Social History  Substance Use Topics  . Smoking status: Current Some Day Smoker -- 0.02 packs/day    Types: Cigarettes  . Smokeless tobacco: Never Used      Comment: 2 cigarettes per pday on 01/31/15    . Alcohol Use: No   OB History    Gravida Para Term Preterm AB TAB SAB Ectopic Multiple Living   4 3 3  1           Review of Systems As in history of present illness Allergies  Tramadol  Home Medications   Prior to Admission medications   Medication Sig Start Date End Date Taking? Authorizing Provider  amLODipine (NORVASC) 10 MG tablet Take 10 mg by mouth every morning.    Yes Historical Provider, MD  colchicine 0.6 MG tablet Take 1 tablet (0.6 mg total) by mouth daily. Patient taking differently: Take 0.6 mg by mouth 2 (two) times daily.  05/17/14  Yes Etta Quill, NP  fluticasone (FLONASE) 50 MCG/ACT nasal spray Place 2 sprays into both nostrils 2 (two) times daily.  04/17/14  Yes Lutricia Feil, PA  acetaminophen (TYLENOL) 500 MG tablet Take 2 tablets (1,000 mg total) by mouth every 6 (six) hours. 02/07/15   Everitt Amber, MD  acetaminophen-codeine (TYLENOL #3) 300-30 MG per tablet  02/20/15   Historical Provider, MD  azithromycin (ZITHROMAX Z-PAK) 250 MG tablet Take 2 pills today, then 1 pill daily until gone. 06/04/15   Melony Overly, MD  clobetasol ointment (TEMOVATE) 7.35 % Apply 1 application topically 2 (  two) times daily. 06/04/15   Melony Overly, MD  diclofenac sodium (VOLTAREN) 1 % GEL Apply 4 g topically 4 (four) times daily. To both knees, please instruct in dosing. Patient taking differently: Apply 4 g topically 4 (four) times daily as needed (PAIN APPLIES TO BOTH KNEES).  10/13/14   Billy Fischer, MD  fexofenadine-pseudoephedrine (ALLEGRA-D) 60-120 MG per tablet Take 1 tablet by mouth 2 (two) times daily as needed (ALLERGIES).    Historical Provider, MD  fluconazole (DIFLUCAN) 100 MG tablet Take 1 tablet (100 mg total) by mouth daily. 06/04/15   Melony Overly, MD  Gauze Pads & Dressings (GAUZE DRESSING) 4"X4" PADS 1 packet by Does not apply route 2 (two) times daily. 02/07/15   Everitt Amber, MD  Gauze Pads & Dressings (GAUZE DRESSING)  4"X4" PADS 1 Device by Does not apply route 2 (two) times daily. 02/26/15   Everitt Amber, MD  ibuprofen (ADVIL,MOTRIN) 800 MG tablet Take 1 tablet (800 mg total) by mouth 3 (three) times daily. 02/07/15   Everitt Amber, MD  indomethacin (INDOCIN) 50 MG capsule Take 50 mg by mouth 3 (three) times daily as needed for mild pain.  12/11/14   Historical Provider, MD  loratadine (CLARITIN) 10 MG tablet Take 10 mg by mouth every morning.  04/17/14   Audelia Hives Presson, PA  magnesium hydroxide (MILK OF MAGNESIA) 400 MG/5ML suspension Take 30 mLs by mouth every 8 (eight) hours. 02/07/15   Everitt Amber, MD  Misc. Devices (CANE) MISC 1 Device by Does not apply route once. 02/07/15   Everitt Amber, MD  neomycin-bacitracin-polymyxin (NEOSPORIN) ointment Apply 1 application topically every 12 (twelve) hours. apply to abdomnal wound 02/26/15   Everitt Amber, MD  NICORETTE 4 MG gum Take 1 each (4 mg total) by mouth 3 (three) times daily. 02/26/15   Everitt Amber, MD  omeprazole (PRILOSEC) 20 MG capsule Take 20 mg by mouth every morning.  12/10/14   Historical Provider, MD  OVER THE COUNTER MEDICATION Place 2 drops into both eyes daily as needed (DRY EYES, RED EYES).    Historical Provider, MD  oxyCODONE (OXY IR/ROXICODONE) 5 MG immediate release tablet Take 1 tablet (5 mg total) by mouth every 4 (four) hours as needed for severe pain. Do not take and drive 12/12/72   Dorothyann Gibbs, NP  predniSONE (DELTASONE) 50 MG tablet Take 1 pill daily for 5 days. 06/04/15   Melony Overly, MD   Meds Ordered and Administered this Visit  Medications - No data to display  BP 122/80 mmHg  Pulse 105  Temp(Src) 98.8 F (37.1 C) (Oral)  Resp 18  SpO2 96% No data found.   Physical Exam  Constitutional: Mercedes Gardner is oriented to person, place, and time. Mercedes Gardner appears well-developed and well-nourished. No distress.  HENT:  Maxillary sinus tenderness. TMs normal bilaterally. Mild nasal mucosal swelling.  Neck: Neck supple.  Cardiovascular: Normal rate.    Pulmonary/Chest: Effort normal.  Neurological: Mercedes Gardner is alert and oriented to person, place, and time.  Skin: Rash (scaly papules and plaques on bilateral palms) noted.    ED Course  Procedures (including critical care time)  Labs Review Labs Reviewed  POCT URINALYSIS DIP (DEVICE) - Abnormal; Notable for the following:    Hgb urine dipstick SMALL (*)    Protein, ur 100 (*)    All other components within normal limits    Imaging Review No results found.    MDM   1. Acute pansinusitis, recurrence not  specified   2. Yeast vaginitis   3. Dyshidrotic eczema    Azithromycin and prednisone for sinusitis. Diflucan for yeast. Clobetasol ointment for eczema. Follow-up as needed.    Melony Overly, MD 06/04/15 [phone]

## 2015-06-04 NOTE — ED Notes (Signed)
Pt here today with sinus pressure, a reoccurring rash on her hands, and a recurrent yeast infection.

## 2015-06-12 ENCOUNTER — Encounter (HOSPITAL_COMMUNITY): Payer: Self-pay | Admitting: *Deleted

## 2015-06-12 ENCOUNTER — Emergency Department (INDEPENDENT_AMBULATORY_CARE_PROVIDER_SITE_OTHER)
Admission: EM | Admit: 2015-06-12 | Discharge: 2015-06-12 | Disposition: A | Discharge status: Home / Self Care | Payer: Medicare Other | Source: Home / Self Care | Attending: Family Medicine | Admitting: Family Medicine

## 2015-06-12 DIAGNOSIS — J302 Other seasonal allergic rhinitis: Secondary | ICD-10-CM | POA: Diagnosis not present

## 2015-06-12 MED ORDER — IPRATROPIUM BROMIDE 0.06 % NA SOLN: 2.0000 | Freq: Four times a day (QID) | NASAL | Status: DC

## 2015-06-12 MED ORDER — TERCONAZOLE 80 MG VA SUPP: 80.0000 mg | Freq: Every day | VAGINAL | Status: DC

## 2015-06-12 NOTE — ED Notes (Signed)
Pt  Reports      Symptoms       Of   Sinus  Pressure            Congestion             Also was    Seen   sev   Weeks  Ago   For  Sinus  Infection

## 2015-06-12 NOTE — ED Provider Notes (Signed)
CSN: [id number]     Arrival date & time 06/12/15  1301 History   First MD Initiated Contact with Patient 06/12/15 1335     Chief Complaint  Patient presents with  . Facial Pain   (Consider location/radiation/quality/duration/timing/severity/associated sxs/prior Treatment) Patient is a 69 y.o. female presenting with URI. The history is provided by the patient.  URI Presenting symptoms: congestion, cough and rhinorrhea   Presenting symptoms: no fever   Severity:  Mild Onset quality:  Gradual Progression:  Unchanged Relieved by:  None tried Worsened by:  Nothing tried Ineffective treatments:  None tried   Past Medical History  Diagnosis Date  . Hypertension   . Gout   . Hyperlipidemia April 2014  . Anxiety   . Dysrhythmia     heart skips a beat   . Shortness of breath dyspnea     due to pressure of cyst per patient   . GERD (gastroesophageal reflux disease)   . Arthritis     knees   . Anemia    Past Surgical History  Procedure Laterality Date  . Cesarean section      x3  . Ovarian cyst surgery Right 1980's  . Salpingoophorectomy Bilateral 02/06/2015    Procedure: [facility] Stall LAPAROTOMY/BILATERAL SALPINGO OOPHORECTOMY;  Surgeon: Everitt Amber, MD;  Location: [facility];  Service: Gynecology;  Laterality: Bilateral;   Family History  Problem Relation Age of Onset  . Cancer Mother   . Hypertension Mother   . Diabetes Mother   . Diabetes Sister   . Hypertension Sister    Social History  Substance Use Topics  . Smoking status: Current Some Day Smoker -- 0.02 packs/day    Types: Cigarettes  . Smokeless tobacco: Never Used     Comment: 2 cigarettes per pday on 01/31/15    . Alcohol Use: No   OB History    Gravida Para Term Preterm AB TAB SAB Ectopic Multiple Living   4 3 3  1           Review of Systems  Constitutional: Negative for fever.  HENT: Positive for congestion, postnasal drip, rhinorrhea and sinus pressure.   Respiratory: Positive for cough.   All other  systems reviewed and are negative.   Allergies  Tramadol  Home Medications   Prior to Admission medications   Medication Sig Start Date End Date Taking? Authorizing Provider  acetaminophen (TYLENOL) 500 MG tablet Take 2 tablets (1,000 mg total) by mouth every 6 (six) hours. 02/07/15   Everitt Amber, MD  acetaminophen-codeine (TYLENOL #3) 300-30 MG per tablet  02/20/15   Historical Provider, MD  amLODipine (NORVASC) 10 MG tablet Take 10 mg by mouth every morning.     Historical Provider, MD  azithromycin (ZITHROMAX Z-PAK) 250 MG tablet Take 2 pills today, then 1 pill daily until gone. 06/04/15   Melony Overly, MD  clobetasol ointment (TEMOVATE) 7.42 % Apply 1 application topically 2 (two) times daily. 06/04/15   Melony Overly, MD  colchicine 0.6 MG tablet Take 1 tablet (0.6 mg total) by mouth daily. Patient taking differently: Take 0.6 mg by mouth 2 (two) times daily.  05/17/14   Etta Quill, NP  diclofenac sodium (VOLTAREN) 1 % GEL Apply 4 g topically 4 (four) times daily. To both knees, please instruct in dosing. Patient taking differently: Apply 4 g topically 4 (four) times daily as needed (PAIN APPLIES TO BOTH KNEES).  10/13/14   Billy Fischer, MD  fexofenadine-pseudoephedrine (ALLEGRA-D) 60-120 MG per tablet Take 1  tablet by mouth 2 (two) times daily as needed (ALLERGIES).    Historical Provider, MD  fluconazole (DIFLUCAN) 100 MG tablet Take 1 tablet (100 mg total) by mouth daily. 06/04/15   Melony Overly, MD  fluticasone (FLONASE) 50 MCG/ACT nasal spray Place 2 sprays into both nostrils 2 (two) times daily.  04/17/14   Audelia Hives Presson, PA  Gauze Pads & Dressings (GAUZE DRESSING) 4"X4" PADS 1 packet by Does not apply route 2 (two) times daily. 02/07/15   Everitt Amber, MD  Gauze Pads & Dressings (GAUZE DRESSING) 4"X4" PADS 1 Device by Does not apply route 2 (two) times daily. 02/26/15   Everitt Amber, MD  ibuprofen (ADVIL,MOTRIN) 800 MG tablet Take 1 tablet (800 mg total) by mouth 3 (three) times daily.  02/07/15   Everitt Amber, MD  indomethacin (INDOCIN) 50 MG capsule Take 50 mg by mouth 3 (three) times daily as needed for mild pain.  12/11/14   Historical Provider, MD  ipratropium (ATROVENT) 0.06 % nasal spray Place 2 sprays into both nostrils 4 (four) times daily. 06/12/15   Billy Fischer, MD  loratadine (CLARITIN) 10 MG tablet Take 10 mg by mouth every morning.  04/17/14   Audelia Hives Presson, PA  magnesium hydroxide (MILK OF MAGNESIA) 400 MG/5ML suspension Take 30 mLs by mouth every 8 (eight) hours. 02/07/15   Everitt Amber, MD  Misc. Devices (CANE) MISC 1 Device by Does not apply route once. 02/07/15   Everitt Amber, MD  neomycin-bacitracin-polymyxin (NEOSPORIN) ointment Apply 1 application topically every 12 (twelve) hours. apply to abdomnal wound 02/26/15   Everitt Amber, MD  NICORETTE 4 MG gum Take 1 each (4 mg total) by mouth 3 (three) times daily. 02/26/15   Everitt Amber, MD  omeprazole (PRILOSEC) 20 MG capsule Take 20 mg by mouth every morning.  12/10/14   Historical Provider, MD  OVER THE COUNTER MEDICATION Place 2 drops into both eyes daily as needed (DRY EYES, RED EYES).    Historical Provider, MD  oxyCODONE (OXY IR/ROXICODONE) 5 MG immediate release tablet Take 1 tablet (5 mg total) by mouth every 4 (four) hours as needed for severe pain. Do not take and drive 0/9/38   Dorothyann Gibbs, NP  predniSONE (DELTASONE) 50 MG tablet Take 1 pill daily for 5 days. 06/04/15   Melony Overly, MD  terconazole (TERAZOL 3) 80 MG vaginal suppository Place 1 suppository (80 mg total) vaginally at bedtime. 06/12/15   Billy Fischer, MD   Meds Ordered and Administered this Visit  Medications - No data to display  BP 122/82 mmHg  Pulse 101  Temp(Src) 98.8 F (37.1 C) (Oral)  Resp 16  SpO2 98% No data found.   Physical Exam  Constitutional: She is oriented to person, place, and time. She appears well-developed and well-nourished. No distress.  HENT:  Head: Normocephalic.  Right Ear: External ear normal.  Left Ear:  External ear normal.  Mouth/Throat: Oropharynx is clear and moist.  Neck: Normal range of motion. Neck supple.  Cardiovascular: Normal heart sounds and intact distal pulses.   Pulmonary/Chest: Effort normal and breath sounds normal.  Lymphadenopathy:    She has no cervical adenopathy.  Neurological: She is alert and oriented to person, place, and time.  Skin: Skin is warm and dry.  Nursing note and vitals reviewed.   ED Course  Procedures (including critical care time)  Labs Review Labs Reviewed - No data to display  Imaging Review No results found.  Visual Acuity Review  Right Eye Distance:   Left Eye Distance:   Bilateral Distance:    Right Eye Near:   Left Eye Near:    Bilateral Near:         MDM   1. Seasonal allergic rhinitis    rx atrovent, terazol supp and mucinex.    Billy Fischer, MD 06/12/15 [phone]

## 2015-06-16 ENCOUNTER — Emergency Department (INDEPENDENT_AMBULATORY_CARE_PROVIDER_SITE_OTHER)
Admission: EM | Admit: 2015-06-16 | Discharge: 2015-06-16 | Disposition: A | Discharge status: Home / Self Care | Payer: Medicare Other | Source: Home / Self Care | Attending: Family Medicine | Admitting: Family Medicine

## 2015-06-16 ENCOUNTER — Encounter (HOSPITAL_COMMUNITY): Payer: Self-pay | Admitting: Emergency Medicine

## 2015-06-16 DIAGNOSIS — S61219A Laceration without foreign body of unspecified finger without damage to nail, initial encounter: Secondary | ICD-10-CM | POA: Diagnosis not present

## 2015-06-16 DIAGNOSIS — Z23 Encounter for immunization: Secondary | ICD-10-CM

## 2015-06-16 LAB — POCT URINALYSIS DIP (DEVICE)
Bilirubin Urine: NEGATIVE
GLUCOSE, UA: NEGATIVE mg/dL
Ketones, ur: NEGATIVE mg/dL
LEUKOCYTES UA: NEGATIVE
Nitrite: NEGATIVE
Protein, ur: 100 mg/dL — AB
Specific Gravity, Urine: 1.02 (ref 1.005–1.030)
UROBILINOGEN UA: 1 mg/dL (ref 0.0–1.0)
pH: 6 (ref 5.0–8.0)

## 2015-06-16 MED ORDER — TETANUS-DIPHTH-ACELL PERTUSSIS 5-2.5-18.5 LF-MCG/0.5 IM SUSP
INTRAMUSCULAR | Status: AC
  Filled 2015-06-16: qty 0.5

## 2015-06-16 MED ORDER — TETANUS-DIPHTH-ACELL PERTUSSIS 5-2.5-18.5 LF-MCG/0.5 IM SUSP
0.5000 mL | Freq: Once | INTRAMUSCULAR | Status: AC
  Administered 2015-06-16: 0.5 mL via INTRAMUSCULAR

## 2015-06-16 MED ORDER — BACITRACIN ZINC 500 UNIT/GM EX OINT
TOPICAL_OINTMENT | CUTANEOUS | Status: AC
  Filled 2015-06-16: qty 0.9

## 2015-06-16 NOTE — ED Notes (Signed)
Patient washed hands thoroughly.  Applied antibiotic ointment and bandaid.

## 2015-06-16 NOTE — ED Provider Notes (Signed)
CSN: [id number]     Arrival date & time 06/16/15  1649 History   First MD Initiated Contact with Patient 06/16/15 1824     Chief Complaint  Patient presents with  . Laceration   (Consider location/radiation/quality/duration/timing/severity/associated sxs/prior Treatment) HPI Comments: 69 year old female was opening a can lid and accidentally cut the left index finger at the very tip of the distal phalanx just beneath the nail. It is a superficial/shallow laceration involving the dermis only. There is no signs of infection. No drainage or bleeding. No involvement of the nail.    Past Medical History  Diagnosis Date  . Hypertension   . Gout   . Hyperlipidemia April 2014  . Anxiety   . Dysrhythmia     heart skips a beat   . Shortness of breath dyspnea     due to pressure of cyst per patient   . GERD (gastroesophageal reflux disease)   . Arthritis     knees   . Anemia    Past Surgical History  Procedure Laterality Date  . Cesarean section      x3  . Ovarian cyst surgery Right 1980's  . Salpingoophorectomy Bilateral 02/06/2015    Procedure: Campbell Stall LAPAROTOMY/BILATERAL SALPINGO OOPHORECTOMY;  Surgeon: Everitt Amber, MD;  Location: [facility];  Service: Gynecology;  Laterality: Bilateral;   Family History  Problem Relation Age of Onset  . Cancer Mother   . Hypertension Mother   . Diabetes Mother   . Diabetes Sister   . Hypertension Sister    Social History  Substance Use Topics  . Smoking status: Current Some Day Smoker -- 0.02 packs/day    Types: Cigarettes  . Smokeless tobacco: Never Used     Comment: 2 cigarettes per pday on 01/31/15    . Alcohol Use: No   OB History    Gravida Para Term Preterm AB TAB SAB Ectopic Multiple Living   4 3 3  1           Review of Systems  Constitutional: Negative.   Respiratory: Negative.   Gastrointestinal: Negative.   Musculoskeletal: Negative.   Skin: Positive for wound.  Neurological: Negative.     Allergies   Tramadol  Home Medications   Prior to Admission medications   Medication Sig Start Date End Date Taking? Authorizing Provider  acetaminophen (TYLENOL) 500 MG tablet Take 2 tablets (1,000 mg total) by mouth every 6 (six) hours. 02/07/15   Everitt Amber, MD  acetaminophen-codeine (TYLENOL #3) 300-30 MG per tablet  02/20/15   Historical Provider, MD  amLODipine (NORVASC) 10 MG tablet Take 10 mg by mouth every morning.     Historical Provider, MD  azithromycin (ZITHROMAX Z-PAK) 250 MG tablet Take 2 pills today, then 1 pill daily until gone. 06/04/15   Melony Overly, MD  clobetasol ointment (TEMOVATE) 8.46 % Apply 1 application topically 2 (two) times daily. 06/04/15   Melony Overly, MD  colchicine 0.6 MG tablet Take 1 tablet (0.6 mg total) by mouth daily. Patient taking differently: Take 0.6 mg by mouth 2 (two) times daily.  05/17/14   Etta Quill, NP  diclofenac sodium (VOLTAREN) 1 % GEL Apply 4 g topically 4 (four) times daily. To both knees, please instruct in dosing. Patient taking differently: Apply 4 g topically 4 (four) times daily as needed (PAIN APPLIES TO BOTH KNEES).  10/13/14   Billy Fischer, MD  fexofenadine-pseudoephedrine (ALLEGRA-D) 60-120 MG per tablet Take 1 tablet by mouth 2 (two) times daily as needed (ALLERGIES).  Historical Provider, MD  fluconazole (DIFLUCAN) 100 MG tablet Take 1 tablet (100 mg total) by mouth daily. 06/04/15   Melony Overly, MD  fluticasone (FLONASE) 50 MCG/ACT nasal spray Place 2 sprays into both nostrils 2 (two) times daily.  04/17/14   Audelia Hives Presson, PA  Gauze Pads & Dressings (GAUZE DRESSING) 4"X4" PADS 1 packet by Does not apply route 2 (two) times daily. 02/07/15   Everitt Amber, MD  Gauze Pads & Dressings (GAUZE DRESSING) 4"X4" PADS 1 Device by Does not apply route 2 (two) times daily. 02/26/15   Everitt Amber, MD  ibuprofen (ADVIL,MOTRIN) 800 MG tablet Take 1 tablet (800 mg total) by mouth 3 (three) times daily. 02/07/15   Everitt Amber, MD  indomethacin (INDOCIN) 50  MG capsule Take 50 mg by mouth 3 (three) times daily as needed for mild pain.  12/11/14   Historical Provider, MD  ipratropium (ATROVENT) 0.06 % nasal spray Place 2 sprays into both nostrils 4 (four) times daily. 06/12/15   Billy Fischer, MD  loratadine (CLARITIN) 10 MG tablet Take 10 mg by mouth every morning.  04/17/14   Audelia Hives Presson, PA  magnesium hydroxide (MILK OF MAGNESIA) 400 MG/5ML suspension Take 30 mLs by mouth every 8 (eight) hours. 02/07/15   Everitt Amber, MD  Misc. Devices (CANE) MISC 1 Device by Does not apply route once. 02/07/15   Everitt Amber, MD  neomycin-bacitracin-polymyxin (NEOSPORIN) ointment Apply 1 application topically every 12 (twelve) hours. apply to abdomnal wound 02/26/15   Everitt Amber, MD  NICORETTE 4 MG gum Take 1 each (4 mg total) by mouth 3 (three) times daily. 02/26/15   Everitt Amber, MD  omeprazole (PRILOSEC) 20 MG capsule Take 20 mg by mouth every morning.  12/10/14   Historical Provider, MD  OVER THE COUNTER MEDICATION Place 2 drops into both eyes daily as needed (DRY EYES, RED EYES).    Historical Provider, MD  oxyCODONE (OXY IR/ROXICODONE) 5 MG immediate release tablet Take 1 tablet (5 mg total) by mouth every 4 (four) hours as needed for severe pain. Do not take and drive 10/22/90   Dorothyann Gibbs, NP  predniSONE (DELTASONE) 50 MG tablet Take 1 pill daily for 5 days. 06/04/15   Melony Overly, MD  terconazole (TERAZOL 3) 80 MG vaginal suppository Place 1 suppository (80 mg total) vaginally at bedtime. 06/12/15   Billy Fischer, MD   Meds Ordered and Administered this Visit   Medications  Tdap (BOOSTRIX) injection 0.5 mL (not administered)    BP 136/88 mmHg  Pulse 96  Temp(Src) 97.3 F (36.3 C) (Oral)  Resp 16  SpO2 97% No data found.   Physical Exam  Constitutional: She is oriented to person, place, and time. She appears well-developed and well-nourished. No distress.  Eyes: EOM are normal.  Neck: Normal range of motion. Neck supple.  Cardiovascular: Normal  rate.   Pulmonary/Chest: Effort normal. No respiratory distress.  Musculoskeletal: She exhibits no edema.  Neurological: She is alert and oriented to person, place, and time. She exhibits normal muscle tone.  Skin: Skin is warm and dry.  8 mm superficial laceration of the dermis of the tip of the left index finger. No redness or swelling. No drainage, exudates, erythema or other signs of infection. Distal neurovascular and motor sensory is intact.  Psychiatric: She has a normal mood and affect.  Nursing note and vitals reviewed.   ED Course  Procedures (including critical care time)  Labs Review  Labs Reviewed  POCT URINALYSIS DIP (DEVICE) - Abnormal; Notable for the following:    Hgb urine dipstick TRACE (*)    Protein, ur 100 (*)    All other components within normal limits    Imaging Review No results found.   Visual Acuity Review  Right Eye Distance:   Left Eye Distance:   Bilateral Distance:    Right Eye Near:   Left Eye Near:    Bilateral Near:         MDM   1. Laceration of finger, initial encounter    Laceration is 4 days old. It is superficial Will clean the wound and apply bacitracin and a dressing. There are no signs of infection. Patient is instructed on how to manage the wound and to seek medical attention for signs of infection. T dap 0.5 cc IM.    Janne Napoleon, NP 06/16/15 [phone]

## 2015-06-16 NOTE — ED Notes (Signed)
Reports cutting left index finger with a can lid.  Injury lies where finger tip and nail meet.  Cut two days ago

## 2015-06-16 NOTE — Discharge Instructions (Signed)
Non-Sutured Laceration  A laceration is a cut or wound that goes through all layers of the skin and into the tissue just beneath the skin. Usually, these are stitched up or held together with tape or glue shortly after the injury occurred. However, if several or more hours have passed before getting care, too many germs (bacteria) get into the laceration. Stitching it closed would bring the risk of infection. If your health care provider feels your laceration is too old, it may be left open and then bandaged to allow healing from the bottom layer up.  HOME CARE INSTRUCTIONS   · Change the bandage (dressing) 2 times a day or as directed by your health care provider.  · If the dressing or packing gauze sticks, soak it off with soapy water.  · When you re-bandage your laceration, make sure that the dressing or packing gauze goes all the way to the bottom of the laceration. The top of the laceration is kept open so it can heal from the bottom up. There is less chance for infection with this method.  · Wash the area with soap and water 2 times a day to remove all the creams or ointments, if used. Rinse off the soap. Pat the area dry with a clean towel. Look for signs of infection, such as redness, swelling, or a red line that goes away from the laceration.  · Re-apply creams or ointments if they were used to bandage the laceration. This helps keep the bandage from sticking.  · If the bandage becomes wet, dirty, or has a bad smell, change it as soon as possible.  · Only take medicine as directed by your health care provider.  You might need a tetanus shot now if:  · You have no idea when you had the last one.  · You have never had a tetanus shot before.  · Your laceration had dirt in it.  · Your laceration was dirty, and your last tetanus shot was more than 7 years ago.  · Your laceration was clean, and your last tetanus shot was more than 10 years ago.  If you need a tetanus shot, and you decide not to get one, there is  a rare chance of getting tetanus. Sickness from tetanus can be serious. If you got a tetanus shot, your arm may swell and get red and warm to the touch at the shot site. This is common and not a problem.  SEEK MEDICAL CARE IF:   · You have redness, swelling, or increasing pain in the laceration.  · You notice a red line that goes away from your laceration.  · You have pus coming from the laceration.  · You have a fever.  · You notice a bad smell coming from the laceration or dressing.  · You notice something coming out of the laceration, such as wood or glass.  · Your laceration is on your hand or foot and you are unable to properly move a finger or toe.  · You have severe swelling around the laceration, causing pain and numbness.  · You notice a change in color in your arm, hand, leg, or foot.  MAKE SURE YOU:   · Understand these instructions.  · Will watch your condition.  · Will get help right away if you are not doing well or get worse.  Document Released: 07/28/2006 Document Revised: 09/04/2013 Document Reviewed: 02/17/2009  [employer]® Patient Information ©2015 [employer]. This information is not intended to 

## 2015-06-16 NOTE — ED Notes (Signed)
Provided yellow card to keep in wallet as a reminder of when she received injection.

## 2015-06-30 ENCOUNTER — Emergency Department (INDEPENDENT_AMBULATORY_CARE_PROVIDER_SITE_OTHER)
Admission: EM | Admit: 2015-06-30 | Discharge: 2015-06-30 | Disposition: A | Discharge status: Home / Self Care | Payer: Medicare Other | Source: Home / Self Care | Attending: Family Medicine | Admitting: Family Medicine

## 2015-06-30 ENCOUNTER — Encounter (HOSPITAL_COMMUNITY): Payer: Self-pay | Admitting: *Deleted

## 2015-06-30 DIAGNOSIS — B373 Candidiasis of vulva and vagina: Secondary | ICD-10-CM

## 2015-06-30 DIAGNOSIS — B3731 Acute candidiasis of vulva and vagina: Secondary | ICD-10-CM

## 2015-06-30 LAB — POCT URINALYSIS DIP (DEVICE)
Glucose, UA: NEGATIVE mg/dL
Leukocytes, UA: NEGATIVE
Nitrite: NEGATIVE
PH: 6 (ref 5.0–8.0)
PROTEIN: 30 mg/dL — AB
Specific Gravity, Urine: 1.02 (ref 1.005–1.030)
Urobilinogen, UA: 1 mg/dL (ref 0.0–1.0)

## 2015-06-30 MED ORDER — TERCONAZOLE 80 MG VA SUPP: 80.0000 mg | Freq: Every day | VAGINAL | Status: DC

## 2015-06-30 MED ORDER — FLUCONAZOLE 150 MG PO TABS: 150.0000 mg | ORAL_TABLET | Freq: Once | ORAL | Status: DC

## 2015-06-30 MED ORDER — IPRATROPIUM BROMIDE 0.02 % IN SOLN: 0.5000 mg | Freq: Once | RESPIRATORY_TRACT | Status: DC

## 2015-06-30 MED ORDER — ALBUTEROL SULFATE (2.5 MG/3ML) 0.083% IN NEBU: 5.0000 mg | INHALATION_SOLUTION | Freq: Once | RESPIRATORY_TRACT | Status: DC

## 2015-06-30 NOTE — ED Notes (Signed)
Pt  States  Vaginal irritation     She  Thinks  She  May  Have  A  Yeast        Or  Bladder     Infection

## 2015-06-30 NOTE — ED Provider Notes (Signed)
CSN: [id number]     Arrival date & time 06/30/15  6 History   First MD Initiated Contact with Patient 06/30/15 [place]     Chief Complaint  Patient presents with  . Vaginitis   (Consider location/radiation/quality/duration/timing/severity/associated sxs/prior Treatment) Patient is a 69 y.o. female presenting with vaginal itching. The history is provided by the patient.  Vaginal Itching This is a new problem. The current episode started yesterday. The problem has been gradually worsening. Pertinent negatives include no abdominal pain. Associated symptoms comments: Seen 10/3 for uti, given abx and thinks she may have gotten a vag yeast inf from same..    Past Medical History  Diagnosis Date  . Hypertension   . Gout   . Hyperlipidemia April 2014  . Anxiety   . Dysrhythmia     heart skips a beat   . Shortness of breath dyspnea     due to pressure of cyst per patient   . GERD (gastroesophageal reflux disease)   . Arthritis     knees   . Anemia    Past Surgical History  Procedure Laterality Date  . Cesarean section      x3  . Ovarian cyst surgery Right 1980's  . Salpingoophorectomy Bilateral 02/06/2015    Procedure: [facility] Stall LAPAROTOMY/BILATERAL SALPINGO OOPHORECTOMY;  Surgeon: Everitt Amber, MD;  Location: [facility];  Service: Gynecology;  Laterality: Bilateral;   Family History  Problem Relation Age of Onset  . Cancer Mother   . Hypertension Mother   . Diabetes Mother   . Diabetes Sister   . Hypertension Sister    Social History  Substance Use Topics  . Smoking status: Current Some Day Smoker -- 0.02 packs/day    Types: Cigarettes  . Smokeless tobacco: Never Used     Comment: 2 cigarettes per pday on 01/31/15    . Alcohol Use: No   OB History    Gravida Para Term Preterm AB TAB SAB Ectopic Multiple Living   4 3 3  1           Review of Systems  Gastrointestinal: Negative.  Negative for abdominal pain.  Genitourinary: Positive for vaginal discharge. Negative for  dysuria, urgency, frequency, vaginal bleeding, vaginal pain and pelvic pain.  Musculoskeletal: Negative.   All other systems reviewed and are negative.   Allergies  Tramadol  Home Medications   Prior to Admission medications   Medication Sig Start Date End Date Taking? Authorizing Provider  acetaminophen (TYLENOL) 500 MG tablet Take 2 tablets (1,000 mg total) by mouth every 6 (six) hours. 02/07/15   Everitt Amber, MD  acetaminophen-codeine (TYLENOL #3) 300-30 MG per tablet  02/20/15   Historical Provider, MD  amLODipine (NORVASC) 10 MG tablet Take 10 mg by mouth every morning.     Historical Provider, MD  azithromycin (ZITHROMAX Z-PAK) 250 MG tablet Take 2 pills today, then 1 pill daily until gone. 06/04/15   Melony Overly, MD  clobetasol ointment (TEMOVATE) 6.76 % Apply 1 application topically 2 (two) times daily. 06/04/15   Melony Overly, MD  colchicine 0.6 MG tablet Take 1 tablet (0.6 mg total) by mouth daily. Patient taking differently: Take 0.6 mg by mouth 2 (two) times daily.  05/17/14   Etta Quill, NP  diclofenac sodium (VOLTAREN) 1 % GEL Apply 4 g topically 4 (four) times daily. To both knees, please instruct in dosing. Patient taking differently: Apply 4 g topically 4 (four) times daily as needed (PAIN APPLIES TO BOTH KNEES).  10/13/14  Billy Fischer, MD  fexofenadine-pseudoephedrine (ALLEGRA-D) 60-120 MG per tablet Take 1 tablet by mouth 2 (two) times daily as needed (ALLERGIES).    Historical Provider, MD  fluconazole (DIFLUCAN) 150 MG tablet Take 1 tablet (150 mg total) by mouth once. 06/30/15   Billy Fischer, MD  fluticasone (FLONASE) 50 MCG/ACT nasal spray Place 2 sprays into both nostrils 2 (two) times daily.  04/17/14   Audelia Hives Presson, PA  Gauze Pads & Dressings (GAUZE DRESSING) 4"X4" PADS 1 packet by Does not apply route 2 (two) times daily. 02/07/15   Everitt Amber, MD  Gauze Pads & Dressings (GAUZE DRESSING) 4"X4" PADS 1 Device by Does not apply route 2 (two) times daily. 02/26/15    Everitt Amber, MD  ibuprofen (ADVIL,MOTRIN) 800 MG tablet Take 1 tablet (800 mg total) by mouth 3 (three) times daily. 02/07/15   Everitt Amber, MD  indomethacin (INDOCIN) 50 MG capsule Take 50 mg by mouth 3 (three) times daily as needed for mild pain.  12/11/14   Historical Provider, MD  ipratropium (ATROVENT) 0.06 % nasal spray Place 2 sprays into both nostrils 4 (four) times daily. 06/12/15   Billy Fischer, MD  loratadine (CLARITIN) 10 MG tablet Take 10 mg by mouth every morning.  04/17/14   Audelia Hives Presson, PA  magnesium hydroxide (MILK OF MAGNESIA) 400 MG/5ML suspension Take 30 mLs by mouth every 8 (eight) hours. 02/07/15   Everitt Amber, MD  Misc. Devices (CANE) MISC 1 Device by Does not apply route once. 02/07/15   Everitt Amber, MD  neomycin-bacitracin-polymyxin (NEOSPORIN) ointment Apply 1 application topically every 12 (twelve) hours. apply to abdomnal wound 02/26/15   Everitt Amber, MD  NICORETTE 4 MG gum Take 1 each (4 mg total) by mouth 3 (three) times daily. 02/26/15   Everitt Amber, MD  omeprazole (PRILOSEC) 20 MG capsule Take 20 mg by mouth every morning.  12/10/14   Historical Provider, MD  OVER THE COUNTER MEDICATION Place 2 drops into both eyes daily as needed (DRY EYES, RED EYES).    Historical Provider, MD  oxyCODONE (OXY IR/ROXICODONE) 5 MG immediate release tablet Take 1 tablet (5 mg total) by mouth every 4 (four) hours as needed for severe pain. Do not take and drive 02/14/32   Dorothyann Gibbs, NP  predniSONE (DELTASONE) 50 MG tablet Take 1 pill daily for 5 days. 06/04/15   Melony Overly, MD  terconazole (TERAZOL 3) 80 MG vaginal suppository Place 1 suppository (80 mg total) vaginally at bedtime. 06/30/15   Billy Fischer, MD   Meds Ordered and Administered this Visit  Medications - No data to display  BP 128/76 mmHg  Pulse 96  Temp(Src) 98.1 F (36.7 C) (Oral)  Resp 18  SpO2 99% No data found.   Physical Exam  Constitutional: She is oriented to person, place, and time. She appears  well-developed and well-nourished.  Abdominal: Soft. Bowel sounds are normal. She exhibits no distension and no mass. There is no tenderness. There is no rebound and no guarding.  Neurological: She is alert and oriented to person, place, and time.  Skin: Skin is warm and dry.  Nursing note and vitals reviewed.   ED Course  Procedures (including critical care time)  Labs Review Labs Reviewed  POCT URINALYSIS DIP (DEVICE) - Abnormal; Notable for the following:    Bilirubin Urine SMALL (*)    Ketones, ur TRACE (*)    Hgb urine dipstick TRACE (*)  Protein, ur 30 (*)    All other components within normal limits    Imaging Review No results found.   Visual Acuity Review  Right Eye Distance:   Left Eye Distance:   Bilateral Distance:    Right Eye Near:   Left Eye Near:    Bilateral Near:         MDM   1. Vaginal candidiasis       Billy Fischer, MD 06/30/15 2129

## 2015-07-17 ENCOUNTER — Encounter (HOSPITAL_COMMUNITY): Payer: Self-pay | Admitting: Emergency Medicine

## 2015-07-17 ENCOUNTER — Emergency Department (INDEPENDENT_AMBULATORY_CARE_PROVIDER_SITE_OTHER)
Admission: EM | Admit: 2015-07-17 | Discharge: 2015-07-17 | Disposition: A | Discharge status: Home / Self Care | Payer: Medicare Other | Source: Home / Self Care

## 2015-07-17 DIAGNOSIS — B373 Candidiasis of vulva and vagina: Secondary | ICD-10-CM

## 2015-07-17 DIAGNOSIS — B3731 Acute candidiasis of vulva and vagina: Secondary | ICD-10-CM

## 2015-07-17 LAB — POCT URINALYSIS DIP (DEVICE)
Bilirubin Urine: NEGATIVE
Glucose, UA: NEGATIVE mg/dL
Ketones, ur: NEGATIVE mg/dL
Leukocytes, UA: NEGATIVE
Nitrite: NEGATIVE
PH: 6 (ref 5.0–8.0)
PROTEIN: NEGATIVE mg/dL
SPECIFIC GRAVITY, URINE: 1.01 (ref 1.005–1.030)
UROBILINOGEN UA: 0.2 mg/dL (ref 0.0–1.0)

## 2015-07-17 MED ORDER — FLUCONAZOLE 200 MG PO TABS: 200.0000 mg | ORAL_TABLET | Freq: Every day | ORAL | Status: AC

## 2015-07-17 NOTE — ED Provider Notes (Signed)
CSN: [id number]     Arrival date & time 07/17/15  1259 History   None    Chief Complaint  Patient presents with  . Vaginitis   (Consider location/radiation/quality/duration/timing/severity/associated sxs/prior Treatment) The history is provided by the patient.    Past Medical History  Diagnosis Date  . Hypertension   . Gout   . Hyperlipidemia April 2014  . Anxiety   . Dysrhythmia     heart skips a beat   . Shortness of breath dyspnea     due to pressure of cyst per patient   . GERD (gastroesophageal reflux disease)   . Arthritis     knees   . Anemia    Past Surgical History  Procedure Laterality Date  . Cesarean section      x3  . Ovarian cyst surgery Right 1980's  . Salpingoophorectomy Bilateral 02/06/2015    Procedure: Campbell Stall LAPAROTOMY/BILATERAL SALPINGO OOPHORECTOMY;  Surgeon: Everitt Amber, MD;  Location: [facility];  Service: Gynecology;  Laterality: Bilateral;   Family History  Problem Relation Age of Onset  . Cancer Mother   . Hypertension Mother   . Diabetes Mother   . Diabetes Sister   . Hypertension Sister    Social History  Substance Use Topics  . Smoking status: Current Some Day Smoker -- 0.02 packs/day    Types: Cigarettes  . Smokeless tobacco: Never Used     Comment: 2 cigarettes per pday on 01/31/15    . Alcohol Use: No   OB History    Gravida Para Term Preterm AB TAB SAB Ectopic Multiple Living   4 3 3  1           Review of Systems  Constitutional: Negative.   Eyes: Negative.   Respiratory: Positive for cough.   Cardiovascular: Negative.   Gastrointestinal: Negative.   Endocrine: Negative.   Genitourinary: Negative.   Musculoskeletal: Negative.   Skin: Negative.   Allergic/Immunologic: Negative.   Neurological: Negative.   Hematological: Negative.   Psychiatric/Behavioral: Negative.     Allergies  Tramadol  Home Medications   Prior to Admission medications   Medication Sig Start Date End Date Taking? Authorizing Provider   amLODipine (NORVASC) 10 MG tablet Take 10 mg by mouth every morning.    Yes Historical Provider, MD  colchicine 0.6 MG tablet Take 1 tablet (0.6 mg total) by mouth daily. Patient taking differently: Take 0.6 mg by mouth 2 (two) times daily.  05/17/14  Yes Etta Quill, NP  loratadine (CLARITIN) 10 MG tablet Take 10 mg by mouth every morning.  04/17/14  Yes Audelia Hives Presson, PA  omeprazole (PRILOSEC) 20 MG capsule Take 20 mg by mouth every morning.  12/10/14  Yes Historical Provider, MD  acetaminophen (TYLENOL) 500 MG tablet Take 2 tablets (1,000 mg total) by mouth every 6 (six) hours. 02/07/15   Everitt Amber, MD  acetaminophen-codeine (TYLENOL #3) 300-30 MG per tablet  02/20/15   Historical Provider, MD  azithromycin (ZITHROMAX Z-PAK) 250 MG tablet Take 2 pills today, then 1 pill daily until gone. 06/04/15   Melony Overly, MD  clobetasol ointment (TEMOVATE) 7.82 % Apply 1 application topically 2 (two) times daily. 06/04/15   Melony Overly, MD  diclofenac sodium (VOLTAREN) 1 % GEL Apply 4 g topically 4 (four) times daily. To both knees, please instruct in dosing. Patient taking differently: Apply 4 g topically 4 (four) times daily as needed (PAIN APPLIES TO BOTH KNEES).  10/13/14   Billy Fischer, MD  fexofenadine-pseudoephedrine (ALLEGRA-D) 60-120 MG per tablet Take 1 tablet by mouth 2 (two) times daily as needed (ALLERGIES).    Historical Provider, MD  fluconazole (DIFLUCAN) 200 MG tablet Take 1 tablet (200 mg total) by mouth daily. 07/17/15 07/24/15  Lysbeth Penner, FNP  fluticasone (FLONASE) 50 MCG/ACT nasal spray Place 2 sprays into both nostrils 2 (two) times daily.  04/17/14   Audelia Hives Presson, PA  Gauze Pads & Dressings (GAUZE DRESSING) 4"X4" PADS 1 packet by Does not apply route 2 (two) times daily. 02/07/15   Everitt Amber, MD  Gauze Pads & Dressings (GAUZE DRESSING) 4"X4" PADS 1 Device by Does not apply route 2 (two) times daily. 02/26/15   Everitt Amber, MD  ibuprofen (ADVIL,MOTRIN) 800 MG tablet Take  1 tablet (800 mg total) by mouth 3 (three) times daily. 02/07/15   Everitt Amber, MD  indomethacin (INDOCIN) 50 MG capsule Take 50 mg by mouth 3 (three) times daily as needed for mild pain.  12/11/14   Historical Provider, MD  ipratropium (ATROVENT) 0.06 % nasal spray Place 2 sprays into both nostrils 4 (four) times daily. 06/12/15   Billy Fischer, MD  magnesium hydroxide (MILK OF MAGNESIA) 400 MG/5ML suspension Take 30 mLs by mouth every 8 (eight) hours. 02/07/15   Everitt Amber, MD  Misc. Devices (CANE) MISC 1 Device by Does not apply route once. 02/07/15   Everitt Amber, MD  neomycin-bacitracin-polymyxin (NEOSPORIN) ointment Apply 1 application topically every 12 (twelve) hours. apply to abdomnal wound 02/26/15   Everitt Amber, MD  NICORETTE 4 MG gum Take 1 each (4 mg total) by mouth 3 (three) times daily. 02/26/15   Everitt Amber, MD  OVER THE COUNTER MEDICATION Place 2 drops into both eyes daily as needed (DRY EYES, RED EYES).    Historical Provider, MD  oxyCODONE (OXY IR/ROXICODONE) 5 MG immediate release tablet Take 1 tablet (5 mg total) by mouth every 4 (four) hours as needed for severe pain. Do not take and drive 09/19/98   Dorothyann Gibbs, NP  predniSONE (DELTASONE) 50 MG tablet Take 1 pill daily for 5 days. 06/04/15   Melony Overly, MD  terconazole (TERAZOL 3) 80 MG vaginal suppository Place 1 suppository (80 mg total) vaginally at bedtime. 06/30/15   Billy Fischer, MD   Meds Ordered and Administered this Visit  Medications - No data to display  BP 134/89 mmHg  Pulse 88  Temp(Src) 98.5 F (36.9 C) (Oral)  Resp 18  SpO2 99% No data found.   Physical Exam  Constitutional: She appears well-developed and well-nourished.  HENT:  Head: Normocephalic and atraumatic.  Left Ear: External ear normal.  Mouth/Throat: Oropharynx is clear and moist.  Eyes: Conjunctivae and EOM are normal. Pupils are equal, round, and reactive to light.  Neck: Normal range of motion. Neck supple.  Cardiovascular: Normal rate,  regular rhythm and normal heart sounds.   Pulmonary/Chest: Effort normal and breath sounds normal.  Abdominal: Soft. Bowel sounds are normal.    ED Course  Procedures (including critical care time)  Labs Review Labs Reviewed  POCT URINALYSIS DIP (DEVICE) - Abnormal; Notable for the following:    Hgb urine dipstick TRACE (*)    All other components within normal limits    Imaging Review No results found.   Visual Acuity Review  Right Eye Distance:   Left Eye Distance:   Bilateral Distance:    Right Eye Near:   Left Eye Near:    Bilateral Near:  MDM   1. Candidiasis of female genitalia    Diflucan 150 mg po qd #7  [place], [place] 07/17/15 1413

## 2015-07-17 NOTE — ED Notes (Signed)
Pt here for recurring or non-healing yeast infection.  She states she has vaginal pain and cloudy urine and she says the vaginal inserts she was given do not help her and it exacerbates her eczyma.

## 2015-08-01 ENCOUNTER — Encounter (HOSPITAL_COMMUNITY): Payer: Self-pay | Admitting: Emergency Medicine

## 2015-08-01 ENCOUNTER — Emergency Department (INDEPENDENT_AMBULATORY_CARE_PROVIDER_SITE_OTHER)
Admission: EM | Admit: 2015-08-01 | Discharge: 2015-08-01 | Disposition: A | Discharge status: Home / Self Care | Payer: Medicare Other | Source: Home / Self Care | Attending: Family Medicine | Admitting: Family Medicine

## 2015-08-01 DIAGNOSIS — B3731 Acute candidiasis of vulva and vagina: Secondary | ICD-10-CM

## 2015-08-01 DIAGNOSIS — B373 Candidiasis of vulva and vagina: Secondary | ICD-10-CM | POA: Diagnosis not present

## 2015-08-01 MED ORDER — FLUCONAZOLE 150 MG PO TABS: 150.0000 mg | ORAL_TABLET | Freq: Once | ORAL | Status: DC

## 2015-08-01 NOTE — ED Provider Notes (Signed)
CSN: [id number]     Arrival date & time 08/01/15  1300 History   First MD Initiated Contact with Patient 08/01/15 1314     Chief Complaint  Patient presents with  . Vaginitis   (Consider location/radiation/quality/duration/timing/severity/associated sxs/prior Treatment) Patient is a 69 y.o. female presenting with vaginal discharge. The history is provided by the patient.  Vaginal Discharge Quality:  White Severity:  Mild Onset quality:  Gradual Progression:  Worsening Chronicity:  Recurrent Associated symptoms: vaginal itching   Associated symptoms: no fever and no genital lesions     Past Medical History  Diagnosis Date  . Hypertension   . Gout   . Hyperlipidemia April 2014  . Anxiety   . Dysrhythmia     heart skips a beat   . Shortness of breath dyspnea     due to pressure of cyst per patient   . GERD (gastroesophageal reflux disease)   . Arthritis     knees   . Anemia    Past Surgical History  Procedure Laterality Date  . Cesarean section      x3  . Ovarian cyst surgery Right 1980's  . Salpingoophorectomy Bilateral 02/06/2015    Procedure: Campbell Stall LAPAROTOMY/BILATERAL SALPINGO OOPHORECTOMY;  Surgeon: Everitt Amber, MD;  Location: [facility];  Service: Gynecology;  Laterality: Bilateral;   Family History  Problem Relation Age of Onset  . Cancer Mother   . Hypertension Mother   . Diabetes Mother   . Diabetes Sister   . Hypertension Sister    Social History  Substance Use Topics  . Smoking status: Current Some Day Smoker -- 0.02 packs/day    Types: Cigarettes  . Smokeless tobacco: Never Used     Comment: 2 cigarettes per pday on 01/31/15    . Alcohol Use: No   OB History    Gravida Para Term Preterm AB TAB SAB Ectopic Multiple Living   4 3 3  1           Review of Systems  Constitutional: Negative for fever.  Gastrointestinal: Negative.   Genitourinary: Positive for vaginal discharge.  All other systems reviewed and are negative.   Allergies   Tramadol  Home Medications   Prior to Admission medications   Medication Sig Start Date End Date Taking? Authorizing Provider  amLODipine (NORVASC) 10 MG tablet Take 10 mg by mouth every morning.    Yes Historical Provider, MD  colchicine 0.6 MG tablet Take 1 tablet (0.6 mg total) by mouth daily. Patient taking differently: Take 0.6 mg by mouth 2 (two) times daily.  05/17/14  Yes Etta Quill, NP  acetaminophen (TYLENOL) 500 MG tablet Take 2 tablets (1,000 mg total) by mouth every 6 (six) hours. 02/07/15   Everitt Amber, MD  acetaminophen-codeine (TYLENOL #3) 300-30 MG per tablet  02/20/15   Historical Provider, MD  azithromycin (ZITHROMAX Z-PAK) 250 MG tablet Take 2 pills today, then 1 pill daily until gone. 06/04/15   Melony Overly, MD  clobetasol ointment (TEMOVATE) AB-123456789 % Apply 1 application topically 2 (two) times daily. 06/04/15   Melony Overly, MD  diclofenac sodium (VOLTAREN) 1 % GEL Apply 4 g topically 4 (four) times daily. To both knees, please instruct in dosing. Patient taking differently: Apply 4 g topically 4 (four) times daily as needed (PAIN APPLIES TO BOTH KNEES).  10/13/14   Billy Fischer, MD  fexofenadine-pseudoephedrine (ALLEGRA-D) 60-120 MG per tablet Take 1 tablet by mouth 2 (two) times daily as needed (ALLERGIES).    Historical  Provider, MD  fluconazole (DIFLUCAN) 150 MG tablet Take 1 tablet (150 mg total) by mouth once. Repeat in 1 week. 08/01/15   Billy Fischer, MD  fluticasone (FLONASE) 50 MCG/ACT nasal spray Place 2 sprays into both nostrils 2 (two) times daily.  04/17/14   Audelia Hives Presson, PA  Gauze Pads & Dressings (GAUZE DRESSING) 4"X4" PADS 1 packet by Does not apply route 2 (two) times daily. 02/07/15   Everitt Amber, MD  Gauze Pads & Dressings (GAUZE DRESSING) 4"X4" PADS 1 Device by Does not apply route 2 (two) times daily. 02/26/15   Everitt Amber, MD  ibuprofen (ADVIL,MOTRIN) 800 MG tablet Take 1 tablet (800 mg total) by mouth 3 (three) times daily. 02/07/15   Everitt Amber, MD   indomethacin (INDOCIN) 50 MG capsule Take 50 mg by mouth 3 (three) times daily as needed for mild pain.  12/11/14   Historical Provider, MD  ipratropium (ATROVENT) 0.06 % nasal spray Place 2 sprays into both nostrils 4 (four) times daily. 06/12/15   Billy Fischer, MD  loratadine (CLARITIN) 10 MG tablet Take 10 mg by mouth every morning.  04/17/14   Audelia Hives Presson, PA  magnesium hydroxide (MILK OF MAGNESIA) 400 MG/5ML suspension Take 30 mLs by mouth every 8 (eight) hours. 02/07/15   Everitt Amber, MD  Misc. Devices (CANE) MISC 1 Device by Does not apply route once. 02/07/15   Everitt Amber, MD  neomycin-bacitracin-polymyxin (NEOSPORIN) ointment Apply 1 application topically every 12 (twelve) hours. apply to abdomnal wound 02/26/15   Everitt Amber, MD  NICORETTE 4 MG gum Take 1 each (4 mg total) by mouth 3 (three) times daily. 02/26/15   Everitt Amber, MD  omeprazole (PRILOSEC) 20 MG capsule Take 20 mg by mouth every morning.  12/10/14   Historical Provider, MD  OVER THE COUNTER MEDICATION Place 2 drops into both eyes daily as needed (DRY EYES, RED EYES).    Historical Provider, MD  oxyCODONE (OXY IR/ROXICODONE) 5 MG immediate release tablet Take 1 tablet (5 mg total) by mouth every 4 (four) hours as needed for severe pain. Do not take and drive [id number]   Dorothyann Gibbs, NP  predniSONE (DELTASONE) 50 MG tablet Take 1 pill daily for 5 days. 06/04/15   Melony Overly, MD  terconazole (TERAZOL 3) 80 MG vaginal suppository Place 1 suppository (80 mg total) vaginally at bedtime. 06/30/15   Billy Fischer, MD   Meds Ordered and Administered this Visit  Medications - No data to display  BP 139/92 mmHg  Pulse 103  Temp(Src) 98.6 F (37 C) (Oral)  Resp 18  SpO2 100% No data found.   Physical Exam  Constitutional: She is oriented to person, place, and time. She appears well-developed and well-nourished. No distress.  Abdominal: Soft. Bowel sounds are normal. There is no tenderness.  Neurological: She is alert and  oriented to person, place, and time.  Skin: Skin is warm and dry.  Nursing note and vitals reviewed.   ED Course  Procedures (including critical care time)  Labs Review Labs Reviewed - No data to display  Imaging Review No results found.   Visual Acuity Review  Right Eye Distance:   Left Eye Distance:   Bilateral Distance:    Right Eye Near:   Left Eye Near:    Bilateral Near:         MDM   1. Vaginal candida        Billy Fischer, MD 08/01/15  1338 

## 2015-08-01 NOTE — ED Notes (Signed)
Pt c/o persistent yeast inf; has been here mult times in the last several months States she has appt w/PCP; sx today include itching Denies abd pain, vag d/c A&O x4... No acute distress.

## 2015-09-14 DIAGNOSIS — B354 Tinea corporis: Secondary | ICD-10-CM

## 2015-09-14 HISTORY — DX: Tinea corporis: B35.4

## 2015-11-21 ENCOUNTER — Encounter (HOSPITAL_COMMUNITY): Payer: Self-pay | Admitting: Emergency Medicine

## 2015-11-21 ENCOUNTER — Emergency Department (HOSPITAL_COMMUNITY): Payer: Medicare Other

## 2015-11-21 ENCOUNTER — Emergency Department (HOSPITAL_COMMUNITY)
Admission: EM | Admit: 2015-11-21 | Discharge: 2015-11-21 | Disposition: A | Discharge status: Home / Self Care | Payer: Medicare Other | Attending: Emergency Medicine | Admitting: Emergency Medicine

## 2015-11-21 DIAGNOSIS — F17210 Nicotine dependence, cigarettes, uncomplicated: Secondary | ICD-10-CM | POA: Diagnosis not present

## 2015-11-21 DIAGNOSIS — M109 Gout, unspecified: Secondary | ICD-10-CM | POA: Insufficient documentation

## 2015-11-21 DIAGNOSIS — Z7952 Long term (current) use of systemic steroids: Secondary | ICD-10-CM | POA: Diagnosis not present

## 2015-11-21 DIAGNOSIS — I10 Essential (primary) hypertension: Secondary | ICD-10-CM | POA: Insufficient documentation

## 2015-11-21 DIAGNOSIS — K219 Gastro-esophageal reflux disease without esophagitis: Secondary | ICD-10-CM | POA: Diagnosis not present

## 2015-11-21 DIAGNOSIS — W01198A Fall on same level from slipping, tripping and stumbling with subsequent striking against other object, initial encounter: Secondary | ICD-10-CM | POA: Diagnosis not present

## 2015-11-21 DIAGNOSIS — Y9289 Other specified places as the place of occurrence of the external cause: Secondary | ICD-10-CM | POA: Insufficient documentation

## 2015-11-21 DIAGNOSIS — Z8639 Personal history of other endocrine, nutritional and metabolic disease: Secondary | ICD-10-CM | POA: Insufficient documentation

## 2015-11-21 DIAGNOSIS — Z7951 Long term (current) use of inhaled steroids: Secondary | ICD-10-CM | POA: Insufficient documentation

## 2015-11-21 DIAGNOSIS — Z79899 Other long term (current) drug therapy: Secondary | ICD-10-CM | POA: Insufficient documentation

## 2015-11-21 DIAGNOSIS — S2232XA Fracture of one rib, left side, initial encounter for closed fracture: Secondary | ICD-10-CM | POA: Insufficient documentation

## 2015-11-21 DIAGNOSIS — Y9389 Activity, other specified: Secondary | ICD-10-CM | POA: Diagnosis not present

## 2015-11-21 DIAGNOSIS — Z8659 Personal history of other mental and behavioral disorders: Secondary | ICD-10-CM | POA: Diagnosis not present

## 2015-11-21 DIAGNOSIS — Z862 Personal history of diseases of the blood and blood-forming organs and certain disorders involving the immune mechanism: Secondary | ICD-10-CM | POA: Insufficient documentation

## 2015-11-21 DIAGNOSIS — M170 Bilateral primary osteoarthritis of knee: Secondary | ICD-10-CM | POA: Diagnosis not present

## 2015-11-21 DIAGNOSIS — S29001A Unspecified injury of muscle and tendon of front wall of thorax, initial encounter: Secondary | ICD-10-CM | POA: Diagnosis present

## 2015-11-21 DIAGNOSIS — Z791 Long term (current) use of non-steroidal anti-inflammatories (NSAID): Secondary | ICD-10-CM | POA: Insufficient documentation

## 2015-11-21 DIAGNOSIS — Y998 Other external cause status: Secondary | ICD-10-CM | POA: Insufficient documentation

## 2015-11-21 MED ORDER — ACETAMINOPHEN 500 MG PO TABS
1000.0000 mg | ORAL_TABLET | Freq: Once | ORAL | Status: AC
  Administered 2015-11-21: 1000 mg via ORAL
  Filled 2015-11-21: qty 2

## 2015-11-21 MED ORDER — MELOXICAM 7.5 MG PO TABS: 7.5000 mg | ORAL_TABLET | Freq: Every day | ORAL | Status: DC

## 2015-11-21 NOTE — ED Notes (Signed)
Bed: [facility] Expected date:  Expected time:  Means of arrival:  Comments: EMS tripped and fell/hit ribs/pain

## 2015-11-21 NOTE — Discharge Instructions (Signed)

## 2015-11-21 NOTE — ED Notes (Signed)
MD at bedside. 

## 2015-11-21 NOTE — ED Provider Notes (Signed)
CSN: [id number]     Arrival date & time 11/21/15  0016 History  By signing my name below, I, [name], attest that this documentation has been prepared under the direction and in the presence of Seamus Warehime, MD.   Electronically Signed: [name], ED Scribe. 11/21/2015. 12:37 AM     Chief Complaint  Patient presents with  . Fall    Patient is a 70 y.o. female presenting with fall. The history is provided by the patient. No language interpreter was used.  Fall This is a new problem. The current episode started 1 to 2 hours ago. The problem occurs rarely. The problem has been resolved. Pertinent negatives include no chest pain, no abdominal pain, no headaches and no shortness of breath. Nothing aggravates the symptoms. Nothing relieves the symptoms. She has tried nothing for the symptoms.   HPI Comments: Mercedes Gardner is a 70 y.o. female who presents to the Emergency Department complaining of sudden onset left rib pain after falling and hitting her side on a chair earlier tonight. The pain is localized to the area of impact and does not radiate. No worsening or alleviating factors noted. She denies hitting her head or LOC during fall. Pt denies any other pertinent symptoms.   Past Medical History  Diagnosis Date  . Hypertension   . Gout   . Hyperlipidemia [name] 2014  . Anxiety   . Dysrhythmia     heart skips a beat   . Shortness of breath dyspnea     due to pressure of cyst per patient   . GERD (gastroesophageal reflux disease)   . Arthritis     knees   . Anemia    Past Surgical History  Procedure Laterality Date  . Cesarean section      x3  . Ovarian cyst surgery Right 1980's  . Salpingoophorectomy Bilateral 02/06/2015    Procedure: Campbell Stall LAPAROTOMY/BILATERAL SALPINGO OOPHORECTOMY;  Surgeon: Everitt Amber, MD;  Location: [facility];  Service: Gynecology;  Laterality: Bilateral;   Family History  Problem Relation Age of Onset  . Cancer Mother   .  Hypertension Mother   . Diabetes Mother   . Diabetes Sister   . Hypertension Sister    Social History  Substance Use Topics  . Smoking status: Current Some Day Smoker -- 0.02 packs/day    Types: Cigarettes  . Smokeless tobacco: Never Used     Comment: 2 cigarettes per pday on 01/31/15    . Alcohol Use: No   OB History    Gravida Para Term Preterm AB TAB SAB Ectopic Multiple Living   4 3 3  1           Review of Systems  Respiratory: Negative for shortness of breath.   Cardiovascular: Negative for chest pain.  Gastrointestinal: Negative for abdominal pain.  Musculoskeletal: Positive for arthralgias.       Pain noted to left rib area.  Neurological: Negative for headaches.  All other systems reviewed and are negative.   Allergies  Tramadol  Home Medications   Prior to Admission medications   Medication Sig Start Date End Date Taking? Authorizing Provider  acetaminophen (TYLENOL) 500 MG tablet Take 2 tablets (1,000 mg total) by mouth every 6 (six) hours. 02/07/15   Everitt Amber, MD  acetaminophen-codeine (TYLENOL #3) 300-30 MG per tablet  02/20/15   Historical Provider, MD  amLODipine (NORVASC) 10 MG tablet Take 10 mg by mouth every morning.     Historical Provider, MD  azithromycin (  ZITHROMAX Z-PAK) 250 MG tablet Take 2 pills today, then 1 pill daily until gone. 06/04/15   Melony Overly, MD  clobetasol ointment (TEMOVATE) AB-123456789 % Apply 1 application topically 2 (two) times daily. 06/04/15   Melony Overly, MD  colchicine 0.6 MG tablet Take 1 tablet (0.6 mg total) by mouth daily. Patient taking differently: Take 0.6 mg by mouth 2 (two) times daily.  05/17/14   Etta Quill, NP  diclofenac sodium (VOLTAREN) 1 % GEL Apply 4 g topically 4 (four) times daily. To both knees, please instruct in dosing. Patient taking differently: Apply 4 g topically 4 (four) times daily as needed (PAIN APPLIES TO BOTH KNEES).  10/13/14   Billy Fischer, MD  fexofenadine-pseudoephedrine (ALLEGRA-D) 60-120 MG per  tablet Take 1 tablet by mouth 2 (two) times daily as needed (ALLERGIES).    Historical Provider, MD  fluconazole (DIFLUCAN) 150 MG tablet Take 1 tablet (150 mg total) by mouth once. Repeat in 1 week. 08/01/15   Billy Fischer, MD  fluticasone (FLONASE) 50 MCG/ACT nasal spray Place 2 sprays into both nostrils 2 (two) times daily.  04/17/14   Audelia Hives Presson, PA  Gauze Pads & Dressings (GAUZE DRESSING) 4"X4" PADS 1 packet by Does not apply route 2 (two) times daily. 02/07/15   Everitt Amber, MD  Gauze Pads & Dressings (GAUZE DRESSING) 4"X4" PADS 1 Device by Does not apply route 2 (two) times daily. 02/26/15   Everitt Amber, MD  ibuprofen (ADVIL,MOTRIN) 800 MG tablet Take 1 tablet (800 mg total) by mouth 3 (three) times daily. 02/07/15   Everitt Amber, MD  indomethacin (INDOCIN) 50 MG capsule Take 50 mg by mouth 3 (three) times daily as needed for mild pain.  12/11/14   Historical Provider, MD  ipratropium (ATROVENT) 0.06 % nasal spray Place 2 sprays into both nostrils 4 (four) times daily. 06/12/15   Billy Fischer, MD  loratadine (CLARITIN) 10 MG tablet Take 10 mg by mouth every morning.  04/17/14   Audelia Hives Presson, PA  magnesium hydroxide (MILK OF MAGNESIA) 400 MG/5ML suspension Take 30 mLs by mouth every 8 (eight) hours. 02/07/15   Everitt Amber, MD  Misc. Devices (CANE) MISC 1 Device by Does not apply route once. 02/07/15   Everitt Amber, MD  neomycin-bacitracin-polymyxin (NEOSPORIN) ointment Apply 1 application topically every 12 (twelve) hours. apply to abdomnal wound 02/26/15   Everitt Amber, MD  NICORETTE 4 MG gum Take 1 each (4 mg total) by mouth 3 (three) times daily. 02/26/15   Everitt Amber, MD  omeprazole (PRILOSEC) 20 MG capsule Take 20 mg by mouth every morning.  12/10/14   Historical Provider, MD  OVER THE COUNTER MEDICATION Place 2 drops into both eyes daily as needed (DRY EYES, RED EYES).    Historical Provider, MD  oxyCODONE (OXY IR/ROXICODONE) 5 MG immediate release tablet Take 1 tablet (5 mg total) by  mouth every 4 (four) hours as needed for severe pain. Do not take and drive [id number]   Dorothyann Gibbs, NP  predniSONE (DELTASONE) 50 MG tablet Take 1 pill daily for 5 days. 06/04/15   Melony Overly, MD  terconazole (TERAZOL 3) 80 MG vaginal suppository Place 1 suppository (80 mg total) vaginally at bedtime. 06/30/15   Billy Fischer, MD   Pulse 84  Temp(Src) 98 F (36.7 C) (Oral)  Resp 14  SpO2 98% Physical Exam  Constitutional: She is oriented to person, place, and time. She appears well-developed and well-nourished.  No distress.  HENT:  Head: Normocephalic and atraumatic. Head is without raccoon's eyes and without Battle's sign.  Mouth/Throat: Oropharynx is clear and moist. No oropharyngeal exudate.  Eyes: EOM are normal. Pupils are equal, round, and reactive to light.  Neck: Normal range of motion. Neck supple.  Cardiovascular: Normal rate, regular rhythm and normal heart sounds.  Exam reveals no friction rub.   No murmur heard. Pulmonary/Chest: Effort normal and breath sounds normal. No respiratory distress. She has no wheezes. She has no rales.  Abdominal: Soft. Bowel sounds are normal. She exhibits no distension. There is no tenderness. There is no rebound and no guarding.  Musculoskeletal: Normal range of motion. She exhibits no edema or tenderness.       Cervical back: Normal.       Thoracic back: Normal.       Lumbar back: Normal.  No crepitance of C, T, or L spine. No step offs. No palpable hematoma.   Neurological: She is alert and oriented to person, place, and time. She has normal reflexes. She displays normal reflexes.  Skin: Skin is warm and dry.  Psychiatric: She has a normal mood and affect.    ED Course  Procedures (including critical care time) DIAGNOSTIC STUDIES: Oxygen Saturation is 98% on RA, normal by my interpretation.    COORDINATION OF CARE: 12:44 AM-Discussed treatment plan which includes DG ribs unilateral with chest left with pt at bedside and pt agreed to  plan.    Labs Review Labs Reviewed - No data to display  Imaging Review No results found. I have personally reviewed and evaluated these images as part of my medical decision-making.   EKG Interpretation None      MDM   Final diagnoses:  None    Already on Tylenol with codeine will add an NSAID and incentive spirometry and have patient follow up with her PMD for recheck.  Patient verbalizes understanding and agrees to follow up   I personally performed the services described in this documentation, which was scribed in my presence. The recorded information has been reviewed and is accurate.        Veatrice Kells, MD 11/21/15 0130

## 2015-11-21 NOTE — ED Notes (Signed)
Pt BIB EMS for fall; per EMS, pt tripped and hit left mid-section on a chair; no LOC or head injury.

## 2016-02-07 ENCOUNTER — Ambulatory Visit (HOSPITAL_COMMUNITY)
Admission: EM | Admit: 2016-02-07 | Discharge: 2016-02-07 | Disposition: A | Discharge status: Home / Self Care | Payer: Medicare Other | Attending: Emergency Medicine | Admitting: Emergency Medicine

## 2016-02-07 ENCOUNTER — Telehealth (HOSPITAL_COMMUNITY): Payer: Self-pay | Admitting: Emergency Medicine

## 2016-02-07 ENCOUNTER — Encounter (HOSPITAL_COMMUNITY): Payer: Self-pay | Admitting: *Deleted

## 2016-02-07 DIAGNOSIS — B3731 Acute candidiasis of vulva and vagina: Secondary | ICD-10-CM

## 2016-02-07 DIAGNOSIS — B373 Candidiasis of vulva and vagina: Secondary | ICD-10-CM

## 2016-02-07 DIAGNOSIS — L309 Dermatitis, unspecified: Secondary | ICD-10-CM

## 2016-02-07 MED ORDER — CLOBETASOL PROPIONATE 0.05 % EX OINT: 1.0000 "application " | TOPICAL_OINTMENT | Freq: Two times a day (BID) | CUTANEOUS | Status: DC

## 2016-02-07 MED ORDER — FLUCONAZOLE 100 MG PO TABS: 100.0000 mg | ORAL_TABLET | Freq: Every day | ORAL | Status: DC

## 2016-02-07 MED ORDER — PREDNISONE 10 MG PO TABS: ORAL_TABLET | ORAL | Status: DC

## 2016-02-07 NOTE — ED Notes (Signed)
Pt reports [facility] will not cover Temovate... Per Dr. Bridgett Larsson, ok to change to fluocinonide (Lidex)... Rx has been called in.

## 2016-02-07 NOTE — ED Notes (Signed)
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 Patients reports itching to arms, legs, and back. Patient also has hx of frequent yeast infections, which she has appt for in a week but was told to come to [facility] for treatment.

## 2016-02-07 NOTE — ED Provider Notes (Signed)
CSN: [id number]     Arrival date & time 02/07/16  1255 History   First MD Initiated Contact with Patient 02/07/16 1324     Chief Complaint  Patient presents with  . Rash  . Vaginitis   (Consider location/radiation/quality/duration/timing/severity/associated sxs/prior Treatment) HPI  Mercedes Gardner is a 70 year old woman here for evaluation of rash. 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 Mercedes Gardner reports round scaly patches primarily on her hands and lower arms. Mercedes Gardner also reports several spots on her back and legs. These are very itchy. Mercedes Gardner has been using triamcinolone cream without improvement.  Mercedes Gardner also reports a yeast infection. Mercedes Gardner has a history of frequent yeast infections. Mercedes Gardner states Mercedes Gardner's been doing better recently with lifestyle and dietary changes. Mercedes Gardner has started developing some vaginal discharge and itching which is typical of her yeast infections. Mercedes Gardner is requesting Diflucan daily for 10 days, which Mercedes Gardner states works well for her.  Past Medical History  Diagnosis Date  . Hypertension   . Gout   . Hyperlipidemia April 2014  . Anxiety   . Dysrhythmia     heart skips a beat   . Shortness of breath dyspnea     due to pressure of cyst per patient   . GERD (gastroesophageal reflux disease)   . Arthritis     knees   . Anemia    Past Surgical History  Procedure Laterality Date  . Cesarean section      x3  . Ovarian cyst surgery Right 1980's  . Salpingoophorectomy Bilateral 02/06/2015    Procedure: Campbell Stall LAPAROTOMY/BILATERAL SALPINGO OOPHORECTOMY;  Surgeon: Everitt Amber, MD;  Location: [facility];  Service: Gynecology;  Laterality: Bilateral;   Family History  Problem Relation Age of Onset  . Cancer Mother   . Hypertension Mother   . Diabetes Mother   . Diabetes Sister   . Hypertension Sister    Social History  Substance Use Topics  . Smoking status: Current Some Day Smoker -- 0.02 packs/day    Types: Cigarettes  . Smokeless  tobacco: Never Used     Comment: 2 cigarettes per pday on 01/31/15    . Alcohol Use: No   OB History    Gravida Para Term Preterm AB TAB SAB Ectopic Multiple Living   4 3 3  1           Review of Systems As in history of present illness Allergies  Tramadol  Home Medications   Prior to Admission medications   Medication Sig Start Date End Date Taking? Authorizing Provider  acetaminophen (TYLENOL) 500 MG tablet Take 2 tablets (1,000 mg total) by mouth every 6 (six) hours. 02/07/15   Everitt Amber, MD  acetaminophen-codeine (TYLENOL #3) 300-30 MG per tablet  02/20/15   Historical Provider, MD  amLODipine (NORVASC) 10 MG tablet Take 10 mg by mouth every morning.     Historical Provider, MD  clobetasol ointment (TEMOVATE) AB-123456789 % Apply 1 application topically 2 (two) times daily. 02/07/16   Melony Overly, MD  colchicine 0.6 MG tablet Take 1 tablet (0.6 mg total) by mouth daily. Patient taking differently: Take 0.6 mg by mouth 2 (two) times daily.  05/17/14   Etta Quill, NP  diclofenac sodium (VOLTAREN) 1 % GEL Apply 4 g topically 4 (four) times daily. To both knees, please instruct in dosing. Patient taking differently: Apply 4 g topically 4 (four) times daily as needed (PAIN APPLIES TO BOTH KNEES).  10/13/14  Billy Fischer, MD  fexofenadine-pseudoephedrine (ALLEGRA-D) 60-120 MG per tablet Take 1 tablet by mouth 2 (two) times daily as needed (ALLERGIES).    Historical Provider, MD  fluconazole (DIFLUCAN) 100 MG tablet Take 1 tablet (100 mg total) by mouth daily. 02/07/16   Melony Overly, MD  fluticasone (FLONASE) 50 MCG/ACT nasal spray Place 2 sprays into both nostrils 2 (two) times daily.  04/17/14   Audelia Hives Presson, PA  Gauze Pads & Dressings (GAUZE DRESSING) 4"X4" PADS 1 packet by Does not apply route 2 (two) times daily. 02/07/15   Everitt Amber, MD  Gauze Pads & Dressings (GAUZE DRESSING) 4"X4" PADS 1 Device by Does not apply route 2 (two) times daily. 02/26/15   Everitt Amber, MD  ibuprofen  (ADVIL,MOTRIN) 800 MG tablet Take 1 tablet (800 mg total) by mouth 3 (three) times daily. 02/07/15   Everitt Amber, MD  indomethacin (INDOCIN) 50 MG capsule Take 50 mg by mouth 3 (three) times daily as needed for mild pain.  12/11/14   Historical Provider, MD  ipratropium (ATROVENT) 0.06 % nasal spray Place 2 sprays into both nostrils 4 (four) times daily. 06/12/15   Billy Fischer, MD  loratadine (CLARITIN) 10 MG tablet Take 10 mg by mouth every morning.  04/17/14   Audelia Hives Presson, PA  magnesium hydroxide (MILK OF MAGNESIA) 400 MG/5ML suspension Take 30 mLs by mouth every 8 (eight) hours. 02/07/15   Everitt Amber, MD  meloxicam (MOBIC) 7.5 MG tablet Take 1 tablet (7.5 mg total) by mouth daily. 11/21/15   Veatrice Kells, MD  Misc. Devices (CANE) MISC 1 Device by Does not apply route once. 02/07/15   Everitt Amber, MD  neomycin-bacitracin-polymyxin (NEOSPORIN) ointment Apply 1 application topically every 12 (twelve) hours. apply to abdomnal wound 02/26/15   Everitt Amber, MD  NICORETTE 4 MG gum Take 1 each (4 mg total) by mouth 3 (three) times daily. 02/26/15   Everitt Amber, MD  omeprazole (PRILOSEC) 20 MG capsule Take 20 mg by mouth every morning.  12/10/14   Historical Provider, MD  OVER THE COUNTER MEDICATION Place 2 drops into both eyes daily as needed (DRY EYES, RED EYES).    Historical Provider, MD  oxyCODONE (OXY IR/ROXICODONE) 5 MG immediate release tablet Take 1 tablet (5 mg total) by mouth every 4 (four) hours as needed for severe pain. Do not take and drive [id number]   Dorothyann Gibbs, NP  predniSONE (DELTASONE) 10 MG tablet Take 6 tablets on day 1, 5 on day 2, 4 on day 3, 3 on day 4, 2 on day 5, and 1 on day 6. 02/07/16   Melony Overly, MD   Meds Ordered and Administered this Visit  Medications - No data to display  BP 147/97 mmHg  Pulse 78  Temp(Src) 98.2 F (36.8 C) (Oral)  Resp 17  SpO2 98% No data found.   Physical Exam  Constitutional: Mercedes Gardner is oriented to person, place, and time. Mercedes Gardner appears  well-developed and well-nourished.  Cardiovascular: Normal rate.   Pulmonary/Chest: Effort normal.  Genitourinary:  Deferred  Neurological: Mercedes Gardner is alert and oriented to person, place, and time.  Skin:  Mercedes Gardner has multiple 5-6 mm scaly plaques on her arms and hands, and back.    ED Course  Procedures (including critical care time)  Labs Review Labs Reviewed - No data to display  Imaging Review No results found.    MDM   1. Eczema   2. Yeast vaginitis  History and exam is consistent with eczema. Guttate psoriasis is also possible, but felt to be less likely. We'll treat with clobetasol ointment and prednisone taper. Prescription for Diflucan given for yeast infection symptoms. Mercedes Gardner does have an appointment with her PCP scheduled for next month. I discussed with her that if this rash is not improving, Mercedes Gardner may need to see a dermatologist.    Melony Overly, MD 02/07/16 1339

## 2016-02-07 NOTE — Discharge Instructions (Signed)
The rash is likely eczema. Use the clobetasol ointment twice a day. Take the prednisone taper as prescribed. You should see improvement within 2-3 days, but it may take the rash a week or 2 to go away. If the rash does not improve by the time you see your primary care doctor, he may want you to see a dermatologist.  Take the Diflucan daily for 10 days for the yeast infection.  Follow-up with your PCP as scheduled.

## 2016-07-02 ENCOUNTER — Encounter (HOSPITAL_COMMUNITY): Payer: Self-pay | Admitting: Emergency Medicine

## 2016-07-02 ENCOUNTER — Ambulatory Visit (HOSPITAL_COMMUNITY)
Admission: EM | Admit: 2016-07-02 | Discharge: 2016-07-02 | Disposition: A | Discharge status: Home / Self Care | Payer: Medicare Other | Attending: Family Medicine | Admitting: Family Medicine

## 2016-07-02 DIAGNOSIS — B373 Candidiasis of vulva and vagina: Secondary | ICD-10-CM

## 2016-07-02 DIAGNOSIS — B3731 Acute candidiasis of vulva and vagina: Secondary | ICD-10-CM

## 2016-07-02 MED ORDER — FLUCONAZOLE 150 MG PO TABS: 150.0000 mg | ORAL_TABLET | Freq: Every day | ORAL | 0 refills | Status: AC

## 2016-07-02 NOTE — ED Provider Notes (Signed)
CSN: [id number]     Arrival date & time 07/02/16  1452 History   None    Chief Complaint  Patient presents with  . Vaginitis   (Consider location/radiation/quality/duration/timing/severity/associated sxs/prior Treatment) HPI  Patient is a 70 y.o. female who presents to UC with c/o symptoms of vaginal yeast infection x 2 weeks.  Reports pruritis in the vaginal area along with white discharge which has mild odor.  Has significant history of yeast infections in the past, last one about 1.5 months ago, treated with 7 days of diflucan and cream.  States she is confident this is what she has. Has appt with gynecology beginning of Nov to determine cause of recurrences.  In monogamous sexual relationship.  Denies dysuria, hematuria, polyuria. Denies genital rash.  Denies fever, chills, abdominal pain.  Past Medical History:  Diagnosis Date  . Anemia   . Anxiety   . Arthritis    knees   . Dysrhythmia    heart skips a beat   . GERD (gastroesophageal reflux disease)   . Gout   . Hyperlipidemia April 2014  . Hypertension   . Shortness of breath dyspnea    due to pressure of cyst per patient    Past Surgical History:  Procedure Laterality Date  . CESAREAN SECTION     x3  . OVARIAN CYST SURGERY Right 1980's  . SALPINGOOPHORECTOMY Bilateral 02/06/2015   Procedure: Campbell Stall LAPAROTOMY/BILATERAL SALPINGO OOPHORECTOMY;  Surgeon: Everitt Amber, MD;  Location: [facility];  Service: Gynecology;  Laterality: Bilateral;   Family History  Problem Relation Age of Onset  . Cancer Mother   . Hypertension Mother   . Diabetes Mother   . Diabetes Sister   . Hypertension Sister    Social History  Substance Use Topics  . Smoking status: Current Some Day Smoker    Packs/day: 0.02    Types: Cigarettes  . Smokeless tobacco: Never Used     Comment: 2 cigarettes per pday on 01/31/15    . Alcohol use No   OB History    Gravida Para Term Preterm AB Living   4 3 3   1      SAB TAB Ectopic Multiple Live  Births                 Review of Systems See HPI  Allergies  Tramadol  Home Medications   Prior to Admission medications   Medication Sig Start Date End Date Taking? Authorizing Provider  acetaminophen (TYLENOL) 500 MG tablet Take 2 tablets (1,000 mg total) by mouth every 6 (six) hours. 02/07/15   Everitt Amber, MD  acetaminophen-codeine (TYLENOL #3) 300-30 MG per tablet  02/20/15   Historical Provider, MD  amLODipine (NORVASC) 10 MG tablet Take 10 mg by mouth every morning.     Historical Provider, MD  clobetasol ointment (TEMOVATE) [id number] % Apply 1 application topically 2 (two) times daily. 02/07/16   Melony Overly, MD  colchicine 0.6 MG tablet Take 1 tablet (0.6 mg total) by mouth daily. Patient taking differently: Take 0.6 mg by mouth 2 (two) times daily.  05/17/14   Etta Quill, NP  diclofenac sodium (VOLTAREN) 1 % GEL Apply 4 g topically 4 (four) times daily. To both knees, please instruct in dosing. Patient taking differently: Apply 4 g topically 4 (four) times daily as needed (PAIN APPLIES TO BOTH KNEES).  10/13/14   Billy Fischer, MD  fexofenadine-pseudoephedrine (ALLEGRA-D) 60-120 MG per tablet Take 1 tablet by mouth 2 (two) times daily as  needed (ALLERGIES).    Historical Provider, MD  fluconazole (DIFLUCAN) 100 MG tablet Take 1 tablet (100 mg total) by mouth daily. 02/07/16   Melony Overly, MD  fluconazole (DIFLUCAN) 150 MG tablet Take 1 tablet (150 mg total) by mouth daily. 07/02/16 07/07/16  Konrad Felix, PA  fluticasone (FLONASE) 50 MCG/ACT nasal spray Place 2 sprays into both nostrils 2 (two) times daily.  04/17/14   Audelia Hives Presson, PA  Gauze Pads & Dressings (GAUZE DRESSING) 4"X4" PADS 1 packet by Does not apply route 2 (two) times daily. 02/07/15   Everitt Amber, MD  Gauze Pads & Dressings (GAUZE DRESSING) 4"X4" PADS 1 Device by Does not apply route 2 (two) times daily. 02/26/15   Everitt Amber, MD  ibuprofen (ADVIL,MOTRIN) 800 MG tablet Take 1 tablet (800 mg total) by mouth 3 (three)  times daily. 02/07/15   Everitt Amber, MD  indomethacin (INDOCIN) 50 MG capsule Take 50 mg by mouth 3 (three) times daily as needed for mild pain.  12/11/14   Historical Provider, MD  ipratropium (ATROVENT) 0.06 % nasal spray Place 2 sprays into both nostrils 4 (four) times daily. 06/12/15   Billy Fischer, MD  loratadine (CLARITIN) 10 MG tablet Take 10 mg by mouth every morning.  04/17/14   Audelia Hives Presson, PA  magnesium hydroxide (MILK OF MAGNESIA) 400 MG/5ML suspension Take 30 mLs by mouth every 8 (eight) hours. 02/07/15   Everitt Amber, MD  meloxicam (MOBIC) 7.5 MG tablet Take 1 tablet (7.5 mg total) by mouth daily. 11/21/15   Veatrice Kells, MD  Misc. Devices (CANE) MISC 1 Device by Does not apply route once. 02/07/15   Everitt Amber, MD  neomycin-bacitracin-polymyxin (NEOSPORIN) ointment Apply 1 application topically every 12 (twelve) hours. apply to abdomnal wound 02/26/15   Everitt Amber, MD  NICORETTE 4 MG gum Take 1 each (4 mg total) by mouth 3 (three) times daily. 02/26/15   Everitt Amber, MD  omeprazole (PRILOSEC) 20 MG capsule Take 20 mg by mouth every morning.  12/10/14   Historical Provider, MD  OVER THE COUNTER MEDICATION Place 2 drops into both eyes daily as needed (DRY EYES, RED EYES).    Historical Provider, MD  oxyCODONE (OXY IR/ROXICODONE) 5 MG immediate release tablet Take 1 tablet (5 mg total) by mouth every 4 (four) hours as needed for severe pain. Do not take and drive [id number]   Dorothyann Gibbs, NP  predniSONE (DELTASONE) 10 MG tablet Take 6 tablets on day 1, 5 on day 2, 4 on day 3, 3 on day 4, 2 on day 5, and 1 on day 6. 02/07/16   Melony Overly, MD   Meds Ordered and Administered this Visit  Medications - No data to display  BP 169/87 (BP Location: Left Arm)   Pulse 84   Temp 98.3 F (36.8 C) (Oral)   Resp 12   SpO2 100%  No data found.   Physical Exam  Constitutional: She is oriented to person, place, and time. She appears well-developed and well-nourished.  HENT:  Head:  Normocephalic and atraumatic.  Eyes: EOM are normal.  Neck: Normal range of motion.  Cardiovascular: Normal rate, regular rhythm and normal heart sounds.   Pulmonary/Chest: Effort normal and breath sounds normal. No respiratory distress.  Abdominal: Soft. She exhibits no distension. There is no tenderness.  Musculoskeletal: Normal range of motion.  Neurological: She is alert and oriented to person, place, and time.  Skin: Skin is warm. Capillary  refill takes less than 2 seconds.  Psychiatric: She has a normal mood and affect. Her behavior is normal.  Nursing note and vitals reviewed.   Urgent Care Course   Clinical Course    Procedures (including critical care time)  Labs Review Labs Reviewed - No data to display  Imaging Review No results found.    MDM   1. Vaginal yeast infection    70 y.o. presents with vaginal pruritis and white discharge.  Diflucan 150 mg qd x 5 days was prescribed at today's visit.  Encouraged keep previously scheduled appointment with gynecologist in November.   Handout on vaginal yeast infections was given to the patient.   Recommended starting probiotics.    Konrad Felix, [place] 07/02/16 [phone]

## 2016-07-02 NOTE — Discharge Instructions (Signed)
Remember to see gynecology at previously scheduled appt coming up in Nov. Recommended adding probiotics

## 2016-07-02 NOTE — ED Triage Notes (Signed)
The patient presented to the [facility] with a complaint of a recurrent yeast infection. The patient stated that she was treated at her PCP 2 weeks ago and prescribed a cream but she continues to have vaginal itching and irritation. The patient reported that she has a follow up scheduled with her PCP as well as a referral to an OB/GYB for the recurring problem.

## 2016-07-12 ENCOUNTER — Ambulatory Visit (INDEPENDENT_AMBULATORY_CARE_PROVIDER_SITE_OTHER): Payer: Self-pay | Admitting: Orthopaedic Surgery

## 2016-07-14 ENCOUNTER — Encounter (HOSPITAL_COMMUNITY): Payer: Self-pay | Admitting: Emergency Medicine

## 2016-07-14 ENCOUNTER — Ambulatory Visit (HOSPITAL_COMMUNITY)
Admission: EM | Admit: 2016-07-14 | Discharge: 2016-07-14 | Disposition: A | Discharge status: Home / Self Care | Payer: Medicare Other | Attending: Emergency Medicine | Admitting: Emergency Medicine

## 2016-07-14 DIAGNOSIS — M1000 Idiopathic gout, unspecified site: Secondary | ICD-10-CM | POA: Diagnosis not present

## 2016-07-14 DIAGNOSIS — M25562 Pain in left knee: Secondary | ICD-10-CM | POA: Diagnosis not present

## 2016-07-14 DIAGNOSIS — M25561 Pain in right knee: Secondary | ICD-10-CM | POA: Diagnosis not present

## 2016-07-14 MED ORDER — INDOMETHACIN ER 75 MG PO CPCR: 75.0000 mg | ORAL_CAPSULE | Freq: Two times a day (BID) | ORAL | 0 refills | Status: DC

## 2016-07-14 MED ORDER — LIDOCAINE HCL 2 % IJ SOLN
INTRAMUSCULAR | Status: AC
  Filled 2016-07-14: qty 20

## 2016-07-14 MED ORDER — METHYLPREDNISOLONE ACETATE 80 MG/ML IJ SUSP
INTRAMUSCULAR | Status: AC
  Filled 2016-07-14: qty 2

## 2016-07-14 NOTE — ED Provider Notes (Signed)
CSN: [id number]     Arrival date & time 07/14/16  1452 History   None    Chief Complaint  Patient presents with  . Knee Pain   (Consider location/radiation/quality/duration/timing/severity/associated sxs/prior Treatment) Patient has hx of chronic knee pain and arthritis and states she is also having severe pain in her left knee from gout attack.  She states she hurts so bad she cannot walk and she states she would like knee joint injections for pain because she is due for this and cannot get in with her PCP who does this for her every 3 months.   The history is provided by the patient.  Knee Pain  Location:  Knee Time since incident:  2 days Injury: no   Knee location:  L knee and R knee Pain details:    Quality:  Aching   Radiates to:  Does not radiate   Severity:  Severe   Onset quality:  Sudden   Duration:  2 days   Timing:  Constant Chronicity:  Chronic Dislocation: no   Foreign body present:  No foreign bodies Tetanus status:  Unknown Relieved by:  Nothing Worsened by:  Nothing Ineffective treatments:  Arthritis medication Associated symptoms: fatigue     Past Medical History:  Diagnosis Date  . Anemia   . Anxiety   . Arthritis    knees   . Dysrhythmia    heart skips a beat   . GERD (gastroesophageal reflux disease)   . Gout   . Hyperlipidemia April 2014  . Hypertension   . Shortness of breath dyspnea    due to pressure of cyst per patient    Past Surgical History:  Procedure Laterality Date  . CESAREAN SECTION     x3  . OVARIAN CYST SURGERY Right 1980's  . SALPINGOOPHORECTOMY Bilateral 02/06/2015   Procedure: Campbell Stall LAPAROTOMY/BILATERAL SALPINGO OOPHORECTOMY;  Surgeon: Everitt Amber, MD;  Location: [facility];  Service: Gynecology;  Laterality: Bilateral;   Family History  Problem Relation Age of Onset  . Cancer Mother   . Hypertension Mother   . Diabetes Mother   . Diabetes Sister   . Hypertension Sister    Social History  Substance Use Topics   . Smoking status: Current Some Day Smoker    Packs/day: 0.02    Types: Cigarettes  . Smokeless tobacco: Never Used     Comment: 2 cigarettes per pday on 01/31/15    . Alcohol use No   OB History    Gravida Para Term Preterm AB Living   4 3 3   1      SAB TAB Ectopic Multiple Live Births                 Review of Systems  Constitutional: Positive for fatigue.  HENT: Negative.   Eyes: Negative.   Respiratory: Negative.   Cardiovascular: Negative.   Gastrointestinal: Negative.   Endocrine: Negative.   Genitourinary: Negative.   Musculoskeletal: Positive for joint swelling.  Allergic/Immunologic: Negative.   Neurological: Negative.   Hematological: Negative.   Psychiatric/Behavioral: Negative.     Allergies  Tramadol  Home Medications   Prior to Admission medications   Medication Sig Start Date End Date Taking? Authorizing Provider  acetaminophen (TYLENOL) 500 MG tablet Take 2 tablets (1,000 mg total) by mouth every 6 (six) hours. 02/07/15  Yes Everitt Amber, MD  acetaminophen-codeine (TYLENOL #3) 300-30 MG per tablet  02/20/15  Yes Historical Provider, MD  amLODipine (NORVASC) 10 MG tablet Take 10 mg by  mouth every morning.    Yes Historical Provider, MD  busPIRone (BUSPAR) 10 MG tablet Take 10 mg by mouth 3 (three) times daily.   Yes Historical Provider, MD  omeprazole (PRILOSEC) 20 MG capsule Take 20 mg by mouth every morning.  12/10/14  Yes Historical Provider, MD  clobetasol ointment (TEMOVATE) [id number] % Apply 1 application topically 2 (two) times daily. 02/07/16   Melony Overly, MD  colchicine 0.6 MG tablet Take 1 tablet (0.6 mg total) by mouth daily. Patient taking differently: Take 0.6 mg by mouth 2 (two) times daily.  05/17/14   Etta Quill, NP  diclofenac sodium (VOLTAREN) 1 % GEL Apply 4 g topically 4 (four) times daily. To both knees, please instruct in dosing. Patient taking differently: Apply 4 g topically 4 (four) times daily as needed (PAIN APPLIES TO BOTH KNEES).  10/13/14    Billy Fischer, MD  fexofenadine-pseudoephedrine (ALLEGRA-D) 60-120 MG per tablet Take 1 tablet by mouth 2 (two) times daily as needed (ALLERGIES).    Historical Provider, MD  fluconazole (DIFLUCAN) 100 MG tablet Take 1 tablet (100 mg total) by mouth daily. 02/07/16   Melony Overly, MD  fluticasone (FLONASE) 50 MCG/ACT nasal spray Place 2 sprays into both nostrils 2 (two) times daily.  04/17/14   Audelia Hives Presson, PA  Gauze Pads & Dressings (GAUZE DRESSING) 4"X4" PADS 1 packet by Does not apply route 2 (two) times daily. 02/07/15   Everitt Amber, MD  Gauze Pads & Dressings (GAUZE DRESSING) 4"X4" PADS 1 Device by Does not apply route 2 (two) times daily. 02/26/15   Everitt Amber, MD  ibuprofen (ADVIL,MOTRIN) 800 MG tablet Take 1 tablet (800 mg total) by mouth 3 (three) times daily. 02/07/15   Everitt Amber, MD  indomethacin (INDOCIN SR) 75 MG CR capsule Take 1 capsule (75 mg total) by mouth 2 (two) times daily with a meal. 07/14/16   Lysbeth Penner, FNP  indomethacin (INDOCIN) 50 MG capsule Take 50 mg by mouth 3 (three) times daily as needed for mild pain.  12/11/14   Historical Provider, MD  ipratropium (ATROVENT) 0.06 % nasal spray Place 2 sprays into both nostrils 4 (four) times daily. 06/12/15   Billy Fischer, MD  loratadine (CLARITIN) 10 MG tablet Take 10 mg by mouth every morning.  04/17/14   Audelia Hives Presson, PA  magnesium hydroxide (MILK OF MAGNESIA) 400 MG/5ML suspension Take 30 mLs by mouth every 8 (eight) hours. 02/07/15   Everitt Amber, MD  meloxicam (MOBIC) 7.5 MG tablet Take 1 tablet (7.5 mg total) by mouth daily. 11/21/15   Veatrice Kells, MD  Misc. Devices (CANE) MISC 1 Device by Does not apply route once. 02/07/15   Everitt Amber, MD  neomycin-bacitracin-polymyxin (NEOSPORIN) ointment Apply 1 application topically every 12 (twelve) hours. apply to abdomnal wound 02/26/15   Everitt Amber, MD  NICORETTE 4 MG gum Take 1 each (4 mg total) by mouth 3 (three) times daily. 02/26/15   Everitt Amber, MD  OVER THE  COUNTER MEDICATION Place 2 drops into both eyes daily as needed (DRY EYES, RED EYES).    Historical Provider, MD  oxyCODONE (OXY IR/ROXICODONE) 5 MG immediate release tablet Take 1 tablet (5 mg total) by mouth every 4 (four) hours as needed for severe pain. Do not take and drive [id number]   Dorothyann Gibbs, NP  predniSONE (DELTASONE) 10 MG tablet Take 6 tablets on day 1, 5 on day 2, 4 on day 3, 3 on  day 4, 2 on day 5, and 1 on day 6. 02/07/16   Melony Overly, MD   Meds Ordered and Administered this Visit  Medications - No data to display  BP 120/73 (BP Location: Right Arm)   Pulse 98   Temp 99.2 F (37.3 C) (Oral)   Resp 16   SpO2 100%  No data found.   Physical Exam  Constitutional: She appears well-developed and well-nourished.  HENT:  Head: Normocephalic and atraumatic.  Eyes: EOM are normal. Pupils are equal, round, and reactive to light.  Neck: Normal range of motion. Neck supple.  Cardiovascular: Normal rate, regular rhythm and normal heart sounds.   Pulmonary/Chest: Effort normal and breath sounds normal.  Abdominal: Soft. Bowel sounds are normal.  Musculoskeletal: She exhibits tenderness.  Left knee with TTP and erythema and swelling and Right knee TTP   Nursing note and vitals reviewed.   Urgent Care Course   Clinical Course    .Joint Aspiration/Arthrocentesis Date/Time: 07/14/2016 4:16 PM Performed by: Lysbeth Penner Authorized by: Melony Overly   Consent:    Consent obtained:  Verbal   Consent given by:  Patient   Risks discussed:  Bleeding, infection and pain   Alternatives discussed:  No treatment Location:    Location:  Knee   Knee:  R knee (Bilateral Knees) Procedure details:    Needle gauge: 27 guage.   Ultrasound guidance: no     Approach:  Lateral   Aspirate amount:  Clear   Aspirate characteristics:  Clear   Steroid injected: yes     Specimen collected: no   Post-procedure details:    Patient tolerance of procedure:  Tolerated well, no  immediate complications   (including critical care time)  Labs Review Labs Reviewed - No data to display  Imaging Review No results found.   Visual Acuity Review  Right Eye Distance:   Left Eye Distance:   Bilateral Distance:    Right Eye Near:   Left Eye Near:    Bilateral Near:         MDM   1. Acute pain of right knee   2. Acute pain of left knee   3. Acute idiopathic gout, unspecified site    Bilateral knee injections with 2 cc's bipuvicaine and 2 cc's 2% lidocaine and one cc of depomedrol 80mg  bilateral knee joints.  Explained need not have anymore knee injections for 3 months.  Indomethacin ER 75mg  one po bid prn gout pain #60 Continue with current colchicine 0.6mg  po bid  Follow up with PCP.    Lysbeth Penner, FNP 07/14/16 1620

## 2016-07-14 NOTE — ED Triage Notes (Signed)
The patient presented to the [facility] with a complaint of chronic bilateral knee pain. The patient stated that she has gout in her left knee and chronic pain in her right knee as well. She stated that she has an upcoming appointment for a steroid shot.

## 2016-10-10 ENCOUNTER — Emergency Department (HOSPITAL_COMMUNITY)
Admission: EM | Admit: 2016-10-10 | Discharge: 2016-10-10 | Disposition: A | Discharge status: Home / Self Care | Payer: Medicare Other | Source: Home / Self Care | Attending: Emergency Medicine | Admitting: Emergency Medicine

## 2016-10-10 ENCOUNTER — Emergency Department (HOSPITAL_COMMUNITY): Payer: Medicare Other

## 2016-10-10 ENCOUNTER — Encounter (HOSPITAL_COMMUNITY): Payer: Self-pay | Admitting: Emergency Medicine

## 2016-10-10 DIAGNOSIS — F17210 Nicotine dependence, cigarettes, uncomplicated: Secondary | ICD-10-CM

## 2016-10-10 DIAGNOSIS — J0110 Acute frontal sinusitis, unspecified: Secondary | ICD-10-CM

## 2016-10-10 DIAGNOSIS — B379 Candidiasis, unspecified: Secondary | ICD-10-CM

## 2016-10-10 DIAGNOSIS — R4781 Slurred speech: Secondary | ICD-10-CM

## 2016-10-10 DIAGNOSIS — R42 Dizziness and giddiness: Secondary | ICD-10-CM

## 2016-10-10 DIAGNOSIS — I10 Essential (primary) hypertension: Secondary | ICD-10-CM | POA: Insufficient documentation

## 2016-10-10 DIAGNOSIS — I6340 Cerebral infarction due to embolism of unspecified cerebral artery: Secondary | ICD-10-CM | POA: Diagnosis not present

## 2016-10-10 DIAGNOSIS — R471 Dysarthria and anarthria: Secondary | ICD-10-CM | POA: Diagnosis not present

## 2016-10-10 DIAGNOSIS — R791 Abnormal coagulation profile: Secondary | ICD-10-CM

## 2016-10-10 DIAGNOSIS — E876 Hypokalemia: Secondary | ICD-10-CM | POA: Diagnosis not present

## 2016-10-10 LAB — COMPREHENSIVE METABOLIC PANEL
ALBUMIN: 3.5 g/dL (ref 3.5–5.0)
ALK PHOS: 110 U/L (ref 38–126)
ALT: 18 U/L (ref 14–54)
ANION GAP: 8 (ref 5–15)
AST: 20 U/L (ref 15–41)
BUN: 8 mg/dL (ref 6–20)
CALCIUM: 10.5 mg/dL — AB (ref 8.9–10.3)
CHLORIDE: 107 mmol/L (ref 101–111)
CO2: 25 mmol/L (ref 22–32)
CREATININE: 0.76 mg/dL (ref 0.44–1.00)
GFR calc non Af Amer: >60 mL/min (ref >60)
GLUCOSE: 107 mg/dL — AB (ref 65–99)
Potassium: 3.2 mmol/L — ABNORMAL LOW (ref 3.5–5.1)
SODIUM: 140 mmol/L (ref 135–145)
Total Bilirubin: 0.3 mg/dL (ref 0.3–1.2)
Total Protein: 7.3 g/dL (ref 6.5–8.1)

## 2016-10-10 LAB — I-STAT CHEM 8, ED
BUN: 9 mg/dL (ref 6–20)
CHLORIDE: 108 mmol/L (ref 101–111)
Calcium, Ion: 1.25 mmol/L (ref 1.15–1.40)
Creatinine, Ser: 0.7 mg/dL (ref 0.44–1.00)
Glucose, Bld: 102 mg/dL — ABNORMAL HIGH (ref 65–99)
HEMATOCRIT: 37 % (ref 36.0–46.0)
Hemoglobin: 12.6 g/dL (ref 12.0–15.0)
POTASSIUM: 3.1 mmol/L — AB (ref 3.5–5.1)
Sodium: 142 mmol/L (ref 135–145)
TCO2: 25 mmol/L (ref 0–100)

## 2016-10-10 LAB — DIFFERENTIAL
BASOS PCT: 0 %
Basophils Absolute: 0 10*3/uL (ref 0.0–0.1)
Eosinophils Absolute: 0.1 10*3/uL (ref 0.0–0.7)
Eosinophils Relative: 2 %
LYMPHS PCT: 23 %
Lymphs Abs: 2.2 10*3/uL (ref 0.7–4.0)
Monocytes Absolute: 0.4 10*3/uL (ref 0.1–1.0)
Monocytes Relative: 4 %
NEUTROS ABS: 6.8 10*3/uL (ref 1.7–7.7)
NEUTROS PCT: 71 %

## 2016-10-10 LAB — CBC
HCT: 36.5 % (ref 36.0–46.0)
Hemoglobin: 12 g/dL (ref 12.0–15.0)
MCH: 30.7 pg (ref 26.0–34.0)
MCHC: 32.9 g/dL (ref 30.0–36.0)
MCV: 93.4 fL (ref 78.0–100.0)
PLATELETS: 234 10*3/uL (ref 150–400)
RBC: 3.91 MIL/uL (ref 3.87–5.11)
RDW: 13 % (ref 11.5–15.5)
WBC: 9.5 10*3/uL (ref 4.0–10.5)

## 2016-10-10 LAB — PROTIME-INR
INR: 0.99
Prothrombin Time: 13.1 seconds (ref 11.4–15.2)

## 2016-10-10 LAB — I-STAT TROPONIN, ED: Troponin i, poc: 0 ng/mL (ref 0.00–0.08)

## 2016-10-10 LAB — ETHANOL

## 2016-10-10 LAB — APTT: APTT: 28 s (ref 24–36)

## 2016-10-10 MED ORDER — AMOXICILLIN-POT CLAVULANATE 875-125 MG PO TABS: 1.0000 | ORAL_TABLET | Freq: Two times a day (BID) | ORAL | 0 refills | Status: DC

## 2016-10-10 MED ORDER — FLUCONAZOLE 100 MG PO TABS
150.0000 mg | ORAL_TABLET | Freq: Once | ORAL | Status: AC
  Administered 2016-10-10: 150 mg via ORAL
  Filled 2016-10-10: qty 2

## 2016-10-10 NOTE — ED Triage Notes (Signed)
Per EMS pt here for evaluation of slurred speech that comes and goes. None at present. Pt also reports dizziness that come and goes, not presently. Pt reports she has been under a lot of stress. Pt reports she doesn't eat as much as she should. CBG was 160. EKG was normal per EMS. Pt MD wants her to drink Ensure but she has not been. No symptoms presently. Pt has an appointment to see her PCP on Tuesday.

## 2016-10-10 NOTE — ED Notes (Signed)
Pt has no neurological deficits at present.

## 2016-10-10 NOTE — ED Notes (Signed)
Upon MD assessing pt she began having slurred speech. MD requested a code stroke be called. 18G PIV placed to RAC and labs obtained, pt taken over to CT 1 with RN @ 1035. Slurred speech subsided upon placing IV and drawing labs.

## 2016-10-10 NOTE — ED Notes (Signed)
[place] sandwich and drinks provided.

## 2016-10-10 NOTE — Discharge Instructions (Signed)
Follow-up with your PCP.

## 2016-10-10 NOTE — Consult Note (Signed)
Requesting Physician: Dr. Greer Ee    Chief Complaint: Code stroke  History obtained from:  Patient     HPI:                                                                                                                                         Mercedes Gardner is an 71 y.o. female department after brought in by EMS. Code stroke was called once in the emergency department. Apparently patient has been having 5 days of waxing and waning intermittent slurred speech. Upon entering the emergency room patient was fine however when she was on the phone with one of her family members she apparently had some slurred speech which the ED doctor noted. This reason coaster was called. By the time patient went to the CT scanner patient was talking clearly with no slurred speech. Patient had no other symptoms at this time. Patient does admit to being under significant amount of stress that she has lost 2 jobs recently and is having financial difficulties. Patient's exam is nonfocal at this time. Patient does have multiple family members around her that are high and anxiety. Patient does have a history of anxiety.  Due to patient having no symptoms currently patient was not a TPA candidate. In addition patient's CT scan of her brain was with out any abnormalities.  Date last known well: Date: 10/10/2016 Time last known well: Time: 10:35 tPA Given: No: no symptoms   Past Medical History:  Diagnosis Date  . Anemia   . Anxiety   . Arthritis    knees   . Dysrhythmia    heart skips a beat   . GERD (gastroesophageal reflux disease)   . Gout   . Hyperlipidemia April 2014  . Hypertension   . Shortness of breath dyspnea    due to pressure of cyst per patient     Past Surgical History:  Procedure Laterality Date  . CESAREAN SECTION     x3  . OVARIAN CYST SURGERY Right 1980's  . SALPINGOOPHORECTOMY Bilateral 02/06/2015   Procedure: Campbell Stall LAPAROTOMY/BILATERAL SALPINGO OOPHORECTOMY;  Surgeon: Everitt Amber,  MD;  Location: [facility];  Service: Gynecology;  Laterality: Bilateral;    Family History  Problem Relation Age of Onset  . Cancer Mother   . Hypertension Mother   . Diabetes Mother   . Diabetes Sister   . Hypertension Sister    Social History:  reports that she has been smoking Cigarettes.  She has been smoking about 0.02 packs per day. She has never used smokeless tobacco. She reports that she does not drink alcohol or use drugs.  Allergies:  Allergies  Allergen Reactions  . Tramadol     Per pt, she got depressed, moody, and had increased back pain when she took tramadol    Medications:  No current facility-administered medications for this encounter.    Current Outpatient Prescriptions  Medication Sig Dispense Refill  . acetaminophen (TYLENOL) 500 MG tablet Take 2 tablets (1,000 mg total) by mouth every 6 (six) hours. 30 tablet 0  . acetaminophen-codeine (TYLENOL #3) 300-30 MG per tablet   0  . amLODipine (NORVASC) 10 MG tablet Take 10 mg by mouth every morning.     . busPIRone (BUSPAR) 10 MG tablet Take 10 mg by mouth 3 (three) times daily.    . clobetasol ointment (TEMOVATE) [id number] % Apply 1 application topically 2 (two) times daily. 60 g 0  . colchicine 0.6 MG tablet Take 1 tablet (0.6 mg total) by mouth daily. (Patient taking differently: Take 0.6 mg by mouth 2 (two) times daily. ) 30 tablet 0  . diclofenac sodium (VOLTAREN) 1 % GEL Apply 4 g topically 4 (four) times daily. To both knees, please instruct in dosing. (Patient taking differently: Apply 4 g topically 4 (four) times daily as needed (PAIN APPLIES TO BOTH KNEES). ) 3 Tube 0  . fexofenadine-pseudoephedrine (ALLEGRA-D) 60-120 MG per tablet Take 1 tablet by mouth 2 (two) times daily as needed (ALLERGIES).    . fluconazole (DIFLUCAN) 100 MG tablet Take 1 tablet (100 mg total) by mouth daily. 10 tablet 0   . fluticasone (FLONASE) 50 MCG/ACT nasal spray Place 2 sprays into both nostrils 2 (two) times daily.     . Gauze Pads & Dressings (GAUZE DRESSING) 4"X4" PADS 1 packet by Does not apply route 2 (two) times daily. 60 each 1  . Gauze Pads & Dressings (GAUZE DRESSING) 4"X4" PADS 1 Device by Does not apply route 2 (two) times daily. 40 each 0  . ibuprofen (ADVIL,MOTRIN) 800 MG tablet Take 1 tablet (800 mg total) by mouth 3 (three) times daily. 30 tablet 0  . indomethacin (INDOCIN SR) 75 MG CR capsule Take 1 capsule (75 mg total) by mouth 2 (two) times daily with a meal. 60 capsule 0  . indomethacin (INDOCIN) 50 MG capsule Take 50 mg by mouth 3 (three) times daily as needed for mild pain.   0  . ipratropium (ATROVENT) 0.06 % nasal spray Place 2 sprays into both nostrils 4 (four) times daily. 15 mL 1  . loratadine (CLARITIN) 10 MG tablet Take 10 mg by mouth every morning.     . magnesium hydroxide (MILK OF MAGNESIA) 400 MG/5ML suspension Take 30 mLs by mouth every 8 (eight) hours. 360 mL 0  . meloxicam (MOBIC) 7.5 MG tablet Take 1 tablet (7.5 mg total) by mouth daily. 10 tablet 0  . Misc. Devices (CANE) MISC 1 Device by Does not apply route once. 1 each 0  . neomycin-bacitracin-polymyxin (NEOSPORIN) ointment Apply 1 application topically every 12 (twelve) hours. apply to abdomnal wound 15 g 0  . NICORETTE 4 MG gum Take 1 each (4 mg total) by mouth 3 (three) times daily. 100 tablet 0  . omeprazole (PRILOSEC) 20 MG capsule Take 20 mg by mouth every morning.   0  . OVER THE COUNTER MEDICATION Place 2 drops into both eyes daily as needed (DRY EYES, RED EYES).    [name] Kitchen oxyCODONE (OXY IR/ROXICODONE) 5 MG immediate release tablet Take 1 tablet (5 mg total) by mouth every 4 (four) hours as needed for severe pain. Do not take and drive 30 tablet 0  . predniSONE (DELTASONE) 10 MG tablet Take 6 tablets on day 1, 5 on day 2, 4 on day 3, 3 on day 4,  2 on day 5, and 1 on day 6. 21 tablet 0     ROS:                                                                                                                                        History obtained from the patient  General ROS: negative for - chills, fatigue, fever, night sweats, weight gain or weight loss Psychological ROS: negative for - behavioral disorder, hallucinations, memory difficulties, mood swings or suicidal ideation Ophthalmic ROS: negative for - blurry vision, double vision, eye pain or loss of vision ENT ROS: negative for - epistaxis, nasal discharge, oral lesions, sore throat, tinnitus or vertigo Allergy and Immunology ROS: negative for - hives or itchy/watery eyes Hematological and Lymphatic ROS: negative for - bleeding problems, bruising or swollen lymph nodes Endocrine ROS: negative for - galactorrhea, hair pattern changes, polydipsia/polyuria or temperature intolerance Respiratory ROS: negative for - cough, hemoptysis, shortness of breath or wheezing Cardiovascular ROS: negative for - chest pain, dyspnea on exertion, edema or irregular heartbeat Gastrointestinal ROS: negative for - abdominal pain, diarrhea, hematemesis, nausea/vomiting or stool incontinence Genito-Urinary ROS: negative for - dysuria, hematuria, incontinence or urinary frequency/urgency Musculoskeletal ROS: negative for - joint swelling or muscular weakness Neurological ROS: as noted in HPI Dermatological ROS: negative for rash and skin lesion changes  Neurologic Examination:                                                                                                      Blood pressure 136/80, pulse 84, temperature 98.6 F (37 C), temperature source Oral, resp. rate 18, height 5\' 5"  (1.651 m), weight 55.8 kg (123 lb), SpO2 100 %.  HEENT-  Normocephalic, no lesions, without obvious abnormality.  Normal external eye and conjunctiva.  Normal TM's bilaterally.  Normal auditory canals and external ears. Normal external nose, mucus membranes and septum.  Normal  pharynx. Cardiovascular- S1, S2 normal, pulses palpable throughout   Lungs- chest clear, no wheezing, rales, normal symmetric air entry Abdomen- normal findings: bowel sounds normal Extremities- no edema Lymph-no adenopathy palpable Musculoskeletal-no joint tenderness, deformity or swelling Skin-warm and dry, no hyperpigmentation, vitiligo, or suspicious lesions  Neurological Examination Mental Status: Alert, oriented, thought content appropriate.  Speech fluent without evidence of aphasia.  Able to follow 3 step commands without difficulty. Cranial Nerves: II: Discs flat bilaterally; Visual fields grossly normal,  III,IV, VI: ptosis not present, extra-ocular motions intact bilaterally, pupils equal, round, reactive to light and  accommodation V,VII: smile symmetric, facial light touch sensation normal bilaterally VIII: hearing normal bilaterally IX,X: uvula rises symmetrically XI: bilateral shoulder shrug XII: midline tongue extension Motor: Right : Upper extremity   5/5    Left:     Upper extremity   5/5  Lower extremity   5/5     Lower extremity   5/5 Tone and bulk:normal tone throughout; no atrophy noted Sensory: Pinprick and light touch intact throughout, bilaterally Deep Tendon Reflexes: 1+ and symmetric throughout Plantars: Right: downgoing   Left: downgoing Cerebellar: normal finger-to-nose, and normal heel-to-shin test Gait: Not tested       Lab Results: Basic Metabolic Panel:  Recent Labs Lab 10/10/16 1042  NA 142  K 3.1*  CL 108  GLUCOSE 102*  BUN 9  CREATININE 0.70    Liver Function Tests: No results for input(s): AST, ALT, ALKPHOS, BILITOT, PROT, ALBUMIN in the last 168 hours. No results for input(s): LIPASE, AMYLASE in the last 168 hours. No results for input(s): AMMONIA in the last 168 hours.  CBC:  Recent Labs Lab 10/10/16 1035 10/10/16 1042  WBC 9.5  --   NEUTROABS 6.8  --   HGB 12.0 12.6  HCT 36.5 37.0  MCV 93.4  --   PLT 234  --      Cardiac Enzymes: No results for input(s): CKTOTAL, CKMB, CKMBINDEX, TROPONINI in the last 168 hours.  Lipid Panel: No results for input(s): CHOL, TRIG, HDL, CHOLHDL, VLDL, LDLCALC in the last 168 hours.  CBG: No results for input(s): GLUCAP in the last 168 hours.  Microbiology: Results for orders placed or performed during the hospital encounter of 05/17/14  Wet prep, genital     Status: Abnormal   Collection Time: 05/17/14  4:50 PM  Result Value Ref Range Status   Yeast Wet Prep HPF POC FEW (A) NONE SEEN Final   Trich, Wet Prep NONE SEEN NONE SEEN Final   Clue Cells Wet Prep HPF POC NONE SEEN NONE SEEN Final   WBC, Wet Prep HPF POC NONE SEEN NONE SEEN Final    Coagulation Studies: No results for input(s): LABPROT, INR in the last 72 hours.  Imaging: No results found.     Assessment and plan discussed with with attending physician and they are in agreement.    Etta Quill PA-C [facility] [phone]  10/10/2016, 10:58 AM   Assessment: 71 y.o. female presenting to the emergency department with 5 day history of intermittent slurred speech which currently is asymptomatic. Head CT was within normal limits. Patient was not a TPA candidate as she had no symptoms. Patient states that she has been under a significant amount of stress lately as she has lost 2 jobs. There is high suspicion that this may be psychogenic.  Stroke Risk Factors - hyperlipidemia and hypertension

## 2016-10-10 NOTE — ED Notes (Signed)
Patient transported to X-ray 

## 2016-10-10 NOTE — ED Provider Notes (Signed)
[facility] DEPT Provider Note   CSN: [id number] Arrival date & time: 10/10/16  1003   An emergency department physician performed an initial assessment on this suspected stroke patient at 1025.  History   Chief Complaint Chief Complaint  Patient presents with  . Dizziness    HPI Mercedes Gardner is a 71 y.o. female.  71 yo F with a chief complaint of transient left-sided facial droop slurred speech and difficulty with ambulation. Going on since yesterday morning. Symptoms of been off and on about 5 or 6 time she said.   The history is provided by the patient.  Dizziness  Associated symptoms: weakness   Associated symptoms: no chest pain, no headaches, no nausea, no palpitations, no shortness of breath and no vomiting   Illness  This is a new problem. The current episode started less than 1 hour ago. The problem occurs constantly. The problem has not changed since onset.Pertinent negatives include no chest pain, no headaches and no shortness of breath. Nothing aggravates the symptoms. Nothing relieves the symptoms. She has tried nothing for the symptoms. The treatment provided no relief.    Past Medical History:  Diagnosis Date  . Anemia   . Anxiety   . Arthritis    knees   . Dysrhythmia    heart skips a beat   . GERD (gastroesophageal reflux disease)   . Gout   . Hyperlipidemia April 2014  . Hypertension   . Shortness of breath dyspnea    due to pressure of cyst per patient     Patient Active Problem List   Diagnosis Date Noted  . Pelvic mass in female 12/26/2014  . GOUT 12/24/2008  . ALLERGIC RHINITIS 12/20/2008  . KNEE PAIN, LEFT 12/20/2008  . TOBACCO ABUSE 07/29/2006  . HYPERTENSION 07/29/2006  . HEMORRHOIDS 07/29/2006    Past Surgical History:  Procedure Laterality Date  . CESAREAN SECTION     x3  . OVARIAN CYST SURGERY Right 1980's  . SALPINGOOPHORECTOMY Bilateral 02/06/2015   Procedure: [facility] Stall LAPAROTOMY/BILATERAL SALPINGO OOPHORECTOMY;   Surgeon: Everitt Amber, MD;  Location: [facility] ORS;  Service: Gynecology;  Laterality: Bilateral;    OB History    Gravida Para Term Preterm AB Living   4 3 3   1      SAB TAB Ectopic Multiple Live Births                   Home Medications    Prior to Admission medications   Medication Sig Start Date End Date Taking? Authorizing Provider  acetaminophen (TYLENOL) 500 MG tablet Take 2 tablets (1,000 mg total) by mouth every 6 (six) hours. 02/07/15   Everitt Amber, MD  acetaminophen-codeine (TYLENOL #3) 300-30 MG per tablet  02/20/15   Historical Provider, MD  amLODipine (NORVASC) 10 MG tablet Take 10 mg by mouth every morning.     Historical Provider, MD  amoxicillin-clavulanate (AUGMENTIN) 875-125 MG tablet Take 1 tablet by mouth 2 (two) times daily. 10/10/16   Deno Etienne, DO  busPIRone (BUSPAR) 10 MG tablet Take 10 mg by mouth 3 (three) times daily.    Historical Provider, MD  clobetasol ointment (TEMOVATE) [id number] % Apply 1 application topically 2 (two) times daily. 02/07/16   Melony Overly, MD  colchicine 0.6 MG tablet Take 1 tablet (0.6 mg total) by mouth daily. Patient taking differently: Take 0.6 mg by mouth 2 (two) times daily.  05/17/14   Etta Quill, NP  diclofenac sodium (VOLTAREN) 1 % GEL Apply 4  g topically 4 (four) times daily. To both knees, please instruct in dosing. Patient taking differently: Apply 4 g topically 4 (four) times daily as needed (PAIN APPLIES TO BOTH KNEES).  10/13/14   Billy Fischer, MD  fexofenadine-pseudoephedrine (ALLEGRA-D) 60-120 MG per tablet Take 1 tablet by mouth 2 (two) times daily as needed (ALLERGIES).    Historical Provider, MD  fluconazole (DIFLUCAN) 100 MG tablet Take 1 tablet (100 mg total) by mouth daily. 02/07/16   Melony Overly, MD  fluticasone (FLONASE) 50 MCG/ACT nasal spray Place 2 sprays into both nostrils 2 (two) times daily.  04/17/14   Audelia Hives Presson, PA  Gauze Pads & Dressings (GAUZE DRESSING) 4"X4" PADS 1 packet by Does not apply route 2 (two) times  daily. 02/07/15   Everitt Amber, MD  Gauze Pads & Dressings (GAUZE DRESSING) 4"X4" PADS 1 Device by Does not apply route 2 (two) times daily. 02/26/15   Everitt Amber, MD  ibuprofen (ADVIL,MOTRIN) 800 MG tablet Take 1 tablet (800 mg total) by mouth 3 (three) times daily. 02/07/15   Everitt Amber, MD  indomethacin (INDOCIN SR) 75 MG CR capsule Take 1 capsule (75 mg total) by mouth 2 (two) times daily with a meal. 07/14/16   Lysbeth Penner, FNP  indomethacin (INDOCIN) 50 MG capsule Take 50 mg by mouth 3 (three) times daily as needed for mild pain.  12/11/14   Historical Provider, MD  ipratropium (ATROVENT) 0.06 % nasal spray Place 2 sprays into both nostrils 4 (four) times daily. 06/12/15   Billy Fischer, MD  loratadine (CLARITIN) 10 MG tablet Take 10 mg by mouth every morning.  04/17/14   Audelia Hives Presson, PA  magnesium hydroxide (MILK OF MAGNESIA) 400 MG/5ML suspension Take 30 mLs by mouth every 8 (eight) hours. 02/07/15   Everitt Amber, MD  meloxicam (MOBIC) 7.5 MG tablet Take 1 tablet (7.5 mg total) by mouth daily. 11/21/15   Veatrice Kells, MD  Misc. Devices (CANE) MISC 1 Device by Does not apply route once. 02/07/15   Everitt Amber, MD  neomycin-bacitracin-polymyxin (NEOSPORIN) ointment Apply 1 application topically every 12 (twelve) hours. apply to abdomnal wound 02/26/15   Everitt Amber, MD  NICORETTE 4 MG gum Take 1 each (4 mg total) by mouth 3 (three) times daily. 02/26/15   Everitt Amber, MD  omeprazole (PRILOSEC) 20 MG capsule Take 20 mg by mouth every morning.  12/10/14   Historical Provider, MD  OVER THE COUNTER MEDICATION Place 2 drops into both eyes daily as needed (DRY EYES, RED EYES).    Historical Provider, MD  oxyCODONE (OXY IR/ROXICODONE) 5 MG immediate release tablet Take 1 tablet (5 mg total) by mouth every 4 (four) hours as needed for severe pain. Do not take and drive [id number]   Dorothyann Gibbs, NP  predniSONE (DELTASONE) 10 MG tablet Take 6 tablets on day 1, 5 on day 2, 4 on day 3, 3 on day 4, 2 on day 5,  and 1 on day 6. 02/07/16   Melony Overly, MD    Family History Family History  Problem Relation Age of Onset  . Cancer Mother   . Hypertension Mother   . Diabetes Mother   . Diabetes Sister   . Hypertension Sister     Social History Social History  Substance Use Topics  . Smoking status: Current Some Day Smoker    Packs/day: 0.02    Types: Cigarettes  . Smokeless tobacco: Never Used  Comment: 2 cigarettes per pday on 01/31/15    . Alcohol use No     Allergies   Tramadol   Review of Systems Review of Systems  Constitutional: Negative for chills and fever.  HENT: Negative for congestion and rhinorrhea.   Eyes: Negative for redness and visual disturbance.  Respiratory: Negative for shortness of breath and wheezing.   Cardiovascular: Negative for chest pain and palpitations.  Gastrointestinal: Negative for nausea and vomiting.  Genitourinary: Negative for dysuria and urgency.  Musculoskeletal: Negative for arthralgias and myalgias.  Skin: Negative for pallor and wound.  Neurological: Positive for facial asymmetry, speech difficulty and weakness. Negative for dizziness and headaches.     Physical Exam Updated Vital Signs BP 136/80   Pulse 84   Temp 98.6 F (37 C) (Oral)   Resp 18   Ht 5\' 5"  (1.651 m)   Wt 123 lb (55.8 kg)   SpO2 100%   BMI 20.47 kg/m   Physical Exam  Constitutional: She is oriented to person, place, and time. She appears well-developed and well-nourished. No distress.  HENT:  Head: Normocephalic and atraumatic.  Eyes: EOM are normal. Pupils are equal, round, and reactive to light.  Neck: Normal range of motion. Neck supple.  Cardiovascular: Normal rate and regular rhythm.  Exam reveals no gallop and no friction rub.   No murmur heard. Pulmonary/Chest: Effort normal. She has no wheezes. She has no rales.  Abdominal: Soft. She exhibits no distension and no mass. There is no tenderness. There is no guarding.  Musculoskeletal: She exhibits no  edema or tenderness.  Neurological: She is alert and oriented to person, place, and time. She has normal strength. No cranial nerve deficit or sensory deficit. She displays a negative Romberg sign. Coordination and gait normal. GCS eye subscore is 4. GCS verbal subscore is 5. GCS motor subscore is 6. She displays no Babinski's sign on the right side. She displays no Babinski's sign on the left side.  Reflex Scores:      Tricep reflexes are 2+ on the right side and 2+ on the left side.      Bicep reflexes are 2+ on the right side and 2+ on the left side.      Brachioradialis reflexes are 2+ on the right side and 2+ on the left side.      Patellar reflexes are 2+ on the right side and 2+ on the left side.      Achilles reflexes are 2+ on the right side and 2+ on the left side. Skin: Skin is warm and dry. She is not diaphoretic.  Psychiatric: She has a normal mood and affect. Her behavior is normal.  Nursing note and vitals reviewed.    ED Treatments / Results  Labs (all labs ordered are listed, but only abnormal results are displayed) Labs Reviewed  COMPREHENSIVE METABOLIC PANEL - Abnormal; Notable for the following:       Result Value   Potassium 3.2 (*)    Glucose, Bld 107 (*)    Calcium 10.5 (*)    All other components within normal limits  I-STAT CHEM 8, ED - Abnormal; Notable for the following:    Potassium 3.1 (*)    Glucose, Bld 102 (*)    All other components within normal limits  PROTIME-INR  APTT  CBC  DIFFERENTIAL  ETHANOL  RAPID URINE DRUG SCREEN, HOSP PERFORMED  URINALYSIS, ROUTINE W REFLEX MICROSCOPIC  I-STAT TROPOININ, ED    EKG  EKG Interpretation  Date/Time:  Sunday October 10 2016 11:01:53 EST Ventricular Rate:  72 PR Interval:    QRS Duration: 84 QT Interval:  360 QTC Calculation: 394 R Axis:   37 Text Interpretation:  Sinus rhythm Probable left atrial enlargement Left ventricular hypertrophy No old tracing to compare Confirmed by Pamula Luther MD, DANIEL  ([id number]) on 10/10/2016 11:38:25 AM       Radiology Dg Chest 2 View  Result Date: 10/10/2016 CLINICAL DATA:  Ataxia, weight loss EXAM: CHEST  2 VIEW COMPARISON:  11/21/2015 FINDINGS: Heart and mediastinal contours are within normal limits. No focal opacities or effusions. No acute bony abnormality. Nipple shadows project over the lower lungs. IMPRESSION: No active cardiopulmonary disease. Electronically Signed   By: Kevin  Dover M.D.   On: 10/10/2016 11:25   Ct Head Code Stroke W/o Cm  Result Date: 10/10/2016 CLINICAL DATA:  Code stroke.  Left facial droop and slurred speech EXAM: CT HEAD WITHOUT CONTRAST TECHNIQUE: Contiguous axial images were obtained from the base of the skull through the vertex without intravenous contrast. COMPARISON:  None. FINDINGS: Brain: No evidence of acute infarction, hemorrhage, hydrocephalus, extra-axial collection or mass lesion/mass effect. Vascular: No hyperdense vessel or unexpected calcification. Skull: Negative Sinuses/Orbits: Mild mucosal edema paranasal sinuses.  Normal orbit. Other: None ASPECTS (Alberta Stroke Program Early CT Score) - Ganglionic level infarction (caudate, lentiform nuclei, internal capsule, insula, M1-M3 cortex): 7 - Supraganglionic infarction (M4-M6 cortex): 3 Total score (0-10 with 10 being normal): 10 IMPRESSION: 1. Negative CT head 2. ASPECTS is 10 These results were called by telephone at the time of interpretation on 10/10/2016 at 10:56 am to Dr. Oster, who verbally acknowledged these results. Electronically Signed   By: Charles  Clark M.D.   On: 10/10/2016 10:57    Procedures Procedures (including critical care time)  Medications Ordered in ED Medications  fluconazole (DIFLUCAN) tablet 150 mg (150 mg Oral Given 10/10/16 1148)     Initial Impression / Assessment and Plan / ED Course  I have reviewed the triage vital signs and the nursing notes.  Pertinent labs & imaging results that were available during my care of the patient were  reviewed by me and considered in my medical decision making (see chart for details).     70  yo F With a chief complaint of transient strokelike symptoms. As I was leaving the room the patient stated that she suddenly had a recurrence of her symptoms. She had left-sided facial droop but no other significant changes on her neuro exam. Had some stuttering and mildly slurred speech. I made this a code stroke. She was evaluated by neurology and found to be likely psychogenic in nature.    Patient feels that she has a sinus infection with severe facial pain and dizziness will start on abx.  D/c home with PCP follow up.  11:50 AM:  I have discussed the diagnosis/risks/treatment options with the patient and family and believe the pt to be eligible for discharge home to follow-up with PCP. We also discussed returning to the ED immediately if new or worsening sx occur. We discussed the sx which are most concerning (e.g., sudden worsening pain, fever, inability to tolerate by mouth) that necessitate immediate return. Medications administered to the patient during their visit and any new prescriptions provided to the patient are listed below.  Medications given during this visit Medications  fluconazole (DIFLUCAN) tablet 150 mg (150 mg Oral Given 10/10/16 1148)     The patient appears reasonably screen and/or stabilized for discharge and  I doubt any other medical condition or other [facility] requiring further screening, evaluation, or treatment in the ED at this time prior to discharge.     Final Clinical Impressions(s) / ED Diagnoses   Final diagnoses:  Dizziness  Slurred speech  Acute frontal sinusitis, recurrence not specified  Yeast infection    New Prescriptions New Prescriptions   AMOXICILLIN-CLAVULANATE (AUGMENTIN) 875-125 MG TABLET    Take 1 tablet by mouth 2 (two) times daily.     Deno Etienne, DO 10/10/16 1151

## 2016-10-11 ENCOUNTER — Observation Stay (HOSPITAL_COMMUNITY): Payer: Medicare Other

## 2016-10-11 ENCOUNTER — Inpatient Hospital Stay (HOSPITAL_COMMUNITY)
Admission: EM | Admit: 2016-10-11 | Discharge: 2016-10-14 | DRG: 064 | Disposition: A | Discharge status: Rehabilitation Facility | Payer: Medicare Other | Attending: Internal Medicine | Admitting: Internal Medicine

## 2016-10-11 ENCOUNTER — Encounter (HOSPITAL_COMMUNITY): Payer: Self-pay

## 2016-10-11 ENCOUNTER — Emergency Department (HOSPITAL_COMMUNITY): Payer: Medicare Other

## 2016-10-11 ENCOUNTER — Inpatient Hospital Stay (HOSPITAL_COMMUNITY): Payer: Medicare Other

## 2016-10-11 DIAGNOSIS — R531 Weakness: Secondary | ICD-10-CM

## 2016-10-11 DIAGNOSIS — G8929 Other chronic pain: Secondary | ICD-10-CM | POA: Diagnosis present

## 2016-10-11 DIAGNOSIS — I6340 Cerebral infarction due to embolism of unspecified cerebral artery: Principal | ICD-10-CM | POA: Diagnosis present

## 2016-10-11 DIAGNOSIS — D649 Anemia, unspecified: Secondary | ICD-10-CM | POA: Diagnosis present

## 2016-10-11 DIAGNOSIS — M170 Bilateral primary osteoarthritis of knee: Secondary | ICD-10-CM | POA: Diagnosis present

## 2016-10-11 DIAGNOSIS — Z8673 Personal history of transient ischemic attack (TIA), and cerebral infarction without residual deficits: Secondary | ICD-10-CM

## 2016-10-11 DIAGNOSIS — I6330 Cerebral infarction due to thrombosis of unspecified cerebral artery: Secondary | ICD-10-CM | POA: Diagnosis present

## 2016-10-11 DIAGNOSIS — J439 Emphysema, unspecified: Secondary | ICD-10-CM | POA: Diagnosis present

## 2016-10-11 DIAGNOSIS — R29810 Facial weakness: Secondary | ICD-10-CM | POA: Diagnosis present

## 2016-10-11 DIAGNOSIS — I69354 Hemiplegia and hemiparesis following cerebral infarction affecting left non-dominant side: Secondary | ICD-10-CM | POA: Diagnosis not present

## 2016-10-11 DIAGNOSIS — F419 Anxiety disorder, unspecified: Secondary | ICD-10-CM | POA: Diagnosis present

## 2016-10-11 DIAGNOSIS — Z8249 Family history of ischemic heart disease and other diseases of the circulatory system: Secondary | ICD-10-CM

## 2016-10-11 DIAGNOSIS — F17200 Nicotine dependence, unspecified, uncomplicated: Secondary | ICD-10-CM | POA: Diagnosis present

## 2016-10-11 DIAGNOSIS — E785 Hyperlipidemia, unspecified: Secondary | ICD-10-CM | POA: Diagnosis present

## 2016-10-11 DIAGNOSIS — R2990 Unspecified symptoms and signs involving the nervous system: Secondary | ICD-10-CM | POA: Diagnosis not present

## 2016-10-11 DIAGNOSIS — K219 Gastro-esophageal reflux disease without esophagitis: Secondary | ICD-10-CM | POA: Diagnosis present

## 2016-10-11 DIAGNOSIS — R0600 Dyspnea, unspecified: Secondary | ICD-10-CM | POA: Diagnosis not present

## 2016-10-11 DIAGNOSIS — Z6820 Body mass index (BMI) 20.0-20.9, adult: Secondary | ICD-10-CM

## 2016-10-11 DIAGNOSIS — R627 Adult failure to thrive: Secondary | ICD-10-CM | POA: Diagnosis present

## 2016-10-11 DIAGNOSIS — E876 Hypokalemia: Secondary | ICD-10-CM | POA: Diagnosis present

## 2016-10-11 DIAGNOSIS — Z791 Long term (current) use of non-steroidal anti-inflammatories (NSAID): Secondary | ICD-10-CM

## 2016-10-11 DIAGNOSIS — E860 Dehydration: Secondary | ICD-10-CM | POA: Diagnosis present

## 2016-10-11 DIAGNOSIS — R634 Abnormal weight loss: Secondary | ICD-10-CM | POA: Diagnosis present

## 2016-10-11 DIAGNOSIS — R911 Solitary pulmonary nodule: Secondary | ICD-10-CM | POA: Diagnosis present

## 2016-10-11 DIAGNOSIS — F17210 Nicotine dependence, cigarettes, uncomplicated: Secondary | ICD-10-CM | POA: Diagnosis present

## 2016-10-11 DIAGNOSIS — I10 Essential (primary) hypertension: Secondary | ICD-10-CM

## 2016-10-11 DIAGNOSIS — D72829 Elevated white blood cell count, unspecified: Secondary | ICD-10-CM | POA: Diagnosis present

## 2016-10-11 DIAGNOSIS — R471 Dysarthria and anarthria: Secondary | ICD-10-CM

## 2016-10-11 DIAGNOSIS — Z8739 Personal history of other diseases of the musculoskeletal system and connective tissue: Secondary | ICD-10-CM | POA: Diagnosis present

## 2016-10-11 DIAGNOSIS — Z79899 Other long term (current) drug therapy: Secondary | ICD-10-CM

## 2016-10-11 DIAGNOSIS — R29898 Other symptoms and signs involving the musculoskeletal system: Secondary | ICD-10-CM | POA: Diagnosis not present

## 2016-10-11 DIAGNOSIS — R269 Unspecified abnormalities of gait and mobility: Secondary | ICD-10-CM | POA: Diagnosis not present

## 2016-10-11 DIAGNOSIS — I639 Cerebral infarction, unspecified: Secondary | ICD-10-CM | POA: Diagnosis not present

## 2016-10-11 DIAGNOSIS — M109 Gout, unspecified: Secondary | ICD-10-CM | POA: Diagnosis present

## 2016-10-11 DIAGNOSIS — E43 Unspecified severe protein-calorie malnutrition: Secondary | ICD-10-CM | POA: Diagnosis present

## 2016-10-11 DIAGNOSIS — Z833 Family history of diabetes mellitus: Secondary | ICD-10-CM

## 2016-10-11 DIAGNOSIS — I651 Occlusion and stenosis of basilar artery: Secondary | ICD-10-CM | POA: Diagnosis present

## 2016-10-11 DIAGNOSIS — I6302 Cerebral infarction due to thrombosis of basilar artery: Secondary | ICD-10-CM

## 2016-10-11 DIAGNOSIS — R4701 Aphasia: Secondary | ICD-10-CM | POA: Diagnosis present

## 2016-10-11 DIAGNOSIS — J189 Pneumonia, unspecified organism: Secondary | ICD-10-CM | POA: Diagnosis present

## 2016-10-11 DIAGNOSIS — G8194 Hemiplegia, unspecified affecting left nondominant side: Secondary | ICD-10-CM | POA: Diagnosis present

## 2016-10-11 DIAGNOSIS — Z79891 Long term (current) use of opiate analgesic: Secondary | ICD-10-CM

## 2016-10-11 DIAGNOSIS — Z888 Allergy status to other drugs, medicaments and biological substances status: Secondary | ICD-10-CM

## 2016-10-11 DIAGNOSIS — I69398 Other sequelae of cerebral infarction: Secondary | ICD-10-CM | POA: Diagnosis not present

## 2016-10-11 LAB — IRON AND TIBC
Iron: 21 ug/dL — ABNORMAL LOW (ref 28–170)
SATURATION RATIOS: 7 % — AB (ref 10.4–31.8)
TIBC: 302 ug/dL (ref 250–450)
UIBC: 281 ug/dL

## 2016-10-11 LAB — URINALYSIS, ROUTINE W REFLEX MICROSCOPIC
BILIRUBIN URINE: NEGATIVE
GLUCOSE, UA: 50 mg/dL — AB
KETONES UR: 20 mg/dL — AB
LEUKOCYTES UA: NEGATIVE
NITRITE: NEGATIVE
PH: 6 (ref 5.0–8.0)
Protein, ur: 30 mg/dL — AB
SPECIFIC GRAVITY, URINE: 1.015 (ref 1.005–1.030)

## 2016-10-11 LAB — COMPREHENSIVE METABOLIC PANEL
ALK PHOS: 106 U/L (ref 38–126)
ALT: 16 U/L (ref 14–54)
ANION GAP: 12 (ref 5–15)
AST: 19 U/L (ref 15–41)
Albumin: 3.6 g/dL (ref 3.5–5.0)
BILIRUBIN TOTAL: 0.6 mg/dL (ref 0.3–1.2)
BUN: 13 mg/dL (ref 6–20)
CALCIUM: 10.1 mg/dL (ref 8.9–10.3)
CO2: 23 mmol/L (ref 22–32)
CREATININE: 0.84 mg/dL (ref 0.44–1.00)
Chloride: 106 mmol/L (ref 101–111)
Glucose, Bld: 167 mg/dL — ABNORMAL HIGH (ref 65–99)
Potassium: 2.6 mmol/L — CL (ref 3.5–5.1)
SODIUM: 141 mmol/L (ref 135–145)
TOTAL PROTEIN: 7.3 g/dL (ref 6.5–8.1)

## 2016-10-11 LAB — RAPID URINE DRUG SCREEN, HOSP PERFORMED
AMPHETAMINES: NOT DETECTED
BARBITURATES: NOT DETECTED
Benzodiazepines: NOT DETECTED
Cocaine: NOT DETECTED
Opiates: POSITIVE — AB
TETRAHYDROCANNABINOL: NOT DETECTED

## 2016-10-11 LAB — ACETAMINOPHEN LEVEL: Acetaminophen (Tylenol), Serum: <10 ug/mL — ABNORMAL LOW (ref 10–30)

## 2016-10-11 LAB — HEPARIN LEVEL (UNFRACTIONATED): Heparin Unfractionated: 0.19 IU/mL — ABNORMAL LOW (ref 0.30–0.70)

## 2016-10-11 LAB — PROCALCITONIN: PROCALCITONIN: 0.23 ng/mL

## 2016-10-11 LAB — RETICULOCYTES
RBC.: 3.77 MIL/uL — ABNORMAL LOW (ref 3.87–5.11)
RETIC CT PCT: 1.5 % (ref 0.4–3.1)
Retic Count, Absolute: 56.6 10*3/uL (ref 19.0–186.0)

## 2016-10-11 LAB — FOLATE: Folate: 19.5 ng/mL (ref >5.9)

## 2016-10-11 LAB — CBC
HCT: 36.4 % (ref 36.0–46.0)
HEMOGLOBIN: 11.7 g/dL — AB (ref 12.0–15.0)
MCH: 30.2 pg (ref 26.0–34.0)
MCHC: 32.1 g/dL (ref 30.0–36.0)
MCV: 94.1 fL (ref 78.0–100.0)
Platelets: 293 10*3/uL (ref 150–400)
RBC: 3.87 MIL/uL (ref 3.87–5.11)
RDW: 12.9 % (ref 11.5–15.5)
WBC: 19.1 10*3/uL — ABNORMAL HIGH (ref 4.0–10.5)

## 2016-10-11 LAB — MRSA PCR SCREENING: MRSA by PCR: NEGATIVE

## 2016-10-11 LAB — TSH: TSH: 0.451 u[IU]/mL (ref 0.350–4.500)

## 2016-10-11 LAB — LACTIC ACID, PLASMA: Lactic Acid, Venous: 1 mmol/L (ref 0.5–1.9)

## 2016-10-11 LAB — MAGNESIUM: Magnesium: 2.2 mg/dL (ref 1.7–2.4)

## 2016-10-11 LAB — SALICYLATE LEVEL: Salicylate Lvl: <7 mg/dL (ref 2.8–30.0)

## 2016-10-11 LAB — VITAMIN B12: VITAMIN B 12: 351 pg/mL (ref 180–914)

## 2016-10-11 LAB — AMMONIA: Ammonia: 27 umol/L (ref 9–35)

## 2016-10-11 LAB — URIC ACID: Uric Acid, Serum: 4.9 mg/dL (ref 2.3–6.6)

## 2016-10-11 LAB — FERRITIN: FERRITIN: 70 ng/mL (ref 11–307)

## 2016-10-11 LAB — ETHANOL: Alcohol, Ethyl (B): <5 mg/dL (ref <5)

## 2016-10-11 MED ORDER — POTASSIUM CHLORIDE IN NACL 20-0.9 MEQ/L-% IV SOLN
INTRAVENOUS | Status: DC
  Administered 2016-10-11: 23:00:00 via INTRAVENOUS
  Filled 2016-10-11 (×3): qty 1000

## 2016-10-11 MED ORDER — PIPERACILLIN-TAZOBACTAM 3.375 G IVPB
3.3750 g | Freq: Three times a day (TID) | INTRAVENOUS | Status: DC
  Administered 2016-10-12 (×2): 3.375 g via INTRAVENOUS
  Filled 2016-10-11 (×3): qty 50

## 2016-10-11 MED ORDER — SODIUM CHLORIDE 0.9 % IV SOLN: INTRAVENOUS | Status: DC

## 2016-10-11 MED ORDER — SODIUM CHLORIDE 0.9 % IV SOLN
30.0000 meq | Freq: Once | INTRAVENOUS | Status: AC
  Administered 2016-10-11: 30 meq via INTRAVENOUS
  Filled 2016-10-11: qty 15

## 2016-10-11 MED ORDER — ACETAMINOPHEN 325 MG PO TABS
650.0000 mg | ORAL_TABLET | ORAL | Status: DC | PRN
  Filled 2016-10-11: qty 2

## 2016-10-11 MED ORDER — FOLIC ACID 1 MG PO TABS
1.0000 mg | ORAL_TABLET | Freq: Every day | ORAL | Status: DC
  Administered 2016-10-12 – 2016-10-14 (×3): 1 mg via ORAL
  Filled 2016-10-11 (×3): qty 1

## 2016-10-11 MED ORDER — NICOTINE 21 MG/24HR TD PT24
21.0000 mg | MEDICATED_PATCH | Freq: Every day | TRANSDERMAL | Status: DC
  Administered 2016-10-14: 21 mg via TRANSDERMAL
  Filled 2016-10-11 (×2): qty 1

## 2016-10-11 MED ORDER — SODIUM CHLORIDE 0.9 % IV BOLUS (SEPSIS)
500.0000 mL | Freq: Once | INTRAVENOUS | Status: AC
  Administered 2016-10-11: 500 mL via INTRAVENOUS

## 2016-10-11 MED ORDER — ASPIRIN 300 MG RE SUPP: 300.0000 mg | Freq: Every day | RECTAL | Status: DC

## 2016-10-11 MED ORDER — ASPIRIN 300 MG RE SUPP: 300.0000 mg | Freq: Once | RECTAL | Status: DC

## 2016-10-11 MED ORDER — VITAMIN B-1 100 MG PO TABS
100.0000 mg | ORAL_TABLET | Freq: Every day | ORAL | Status: DC
  Administered 2016-10-12 – 2016-10-14 (×3): 100 mg via ORAL
  Filled 2016-10-11 (×3): qty 1

## 2016-10-11 MED ORDER — ACETAMINOPHEN 650 MG RE SUPP: 650.0000 mg | RECTAL | Status: DC | PRN

## 2016-10-11 MED ORDER — FLUTICASONE PROPIONATE 50 MCG/ACT NA SUSP
2.0000 | Freq: Two times a day (BID) | NASAL | Status: DC
  Administered 2016-10-11 – 2016-10-14 (×6): 2 via NASAL
  Filled 2016-10-11: qty 16

## 2016-10-11 MED ORDER — LORAZEPAM 1 MG PO TABS
1.0000 mg | ORAL_TABLET | Freq: Four times a day (QID) | ORAL | Status: AC | PRN
  Administered 2016-10-14: 1 mg via ORAL
  Filled 2016-10-11: qty 1

## 2016-10-11 MED ORDER — ENOXAPARIN SODIUM 40 MG/0.4ML ~~LOC~~ SOLN: 40.0000 mg | SUBCUTANEOUS | Status: DC

## 2016-10-11 MED ORDER — ADULT MULTIVITAMIN W/MINERALS CH
1.0000 | ORAL_TABLET | Freq: Every day | ORAL | Status: DC
  Administered 2016-10-12 – 2016-10-14 (×3): 1 via ORAL
  Filled 2016-10-11 (×3): qty 1

## 2016-10-11 MED ORDER — ACETAMINOPHEN 160 MG/5ML PO SOLN: 650.0000 mg | ORAL | Status: DC | PRN

## 2016-10-11 MED ORDER — PIPERACILLIN-TAZOBACTAM 3.375 G IVPB 30 MIN: 3.3750 g | Freq: Once | INTRAVENOUS | Status: DC

## 2016-10-11 MED ORDER — FAMOTIDINE IN NACL 20-0.9 MG/50ML-% IV SOLN
20.0000 mg | Freq: Once | INTRAVENOUS | Status: AC
  Administered 2016-10-12: 20 mg via INTRAVENOUS
  Filled 2016-10-11: qty 50

## 2016-10-11 MED ORDER — ACETAMINOPHEN 325 MG PO TABS
650.0000 mg | ORAL_TABLET | ORAL | Status: DC | PRN
  Administered 2016-10-12 – 2016-10-13 (×3): 650 mg via ORAL
  Filled 2016-10-11 (×2): qty 2

## 2016-10-11 MED ORDER — IOPAMIDOL (ISOVUE-370) INJECTION 76%
INTRAVENOUS | Status: AC
  Administered 2016-10-11: 50 mL
  Filled 2016-10-11: qty 50

## 2016-10-11 MED ORDER — HEPARIN (PORCINE) IN NACL 100-0.45 UNIT/ML-% IJ SOLN
850.0000 [IU]/h | INTRAMUSCULAR | Status: DC
  Administered 2016-10-11: 650 [IU]/h via INTRAVENOUS
  Administered 2016-10-12: 850 [IU]/h via INTRAVENOUS
  Filled 2016-10-11 (×3): qty 250

## 2016-10-11 MED ORDER — THIAMINE HCL 100 MG/ML IJ SOLN: 100.0000 mg | Freq: Every day | INTRAMUSCULAR | Status: DC

## 2016-10-11 MED ORDER — ASPIRIN 325 MG PO TABS
325.0000 mg | ORAL_TABLET | Freq: Every day | ORAL | Status: DC
  Administered 2016-10-12: 325 mg via ORAL
  Filled 2016-10-11: qty 1

## 2016-10-11 MED ORDER — LORAZEPAM 2 MG/ML IJ SOLN: 1.0000 mg | Freq: Four times a day (QID) | INTRAMUSCULAR | Status: AC | PRN

## 2016-10-11 MED ORDER — ASPIRIN 300 MG RE SUPP
300.0000 mg | RECTAL | Status: AC
  Administered 2016-10-11: 300 mg via RECTAL
  Filled 2016-10-11: qty 1

## 2016-10-11 MED ORDER — ACETAMINOPHEN 650 MG RE SUPP
650.0000 mg | RECTAL | Status: DC | PRN
  Administered 2016-10-11 – 2016-10-12 (×2): 650 mg via RECTAL
  Filled 2016-10-11 (×2): qty 1

## 2016-10-11 MED ORDER — SENNOSIDES-DOCUSATE SODIUM 8.6-50 MG PO TABS
1.0000 | ORAL_TABLET | Freq: Every evening | ORAL | Status: DC | PRN
  Filled 2016-10-11: qty 1

## 2016-10-11 MED ORDER — STROKE: EARLY STAGES OF RECOVERY BOOK
Freq: Once | Status: AC
  Administered 2016-10-11: 23:00:00
  Filled 2016-10-11: qty 1

## 2016-10-11 MED ORDER — STROKE: EARLY STAGES OF RECOVERY BOOK
Freq: Once | Status: DC
Start: 2016-10-11 — End: 2016-10-14
  Filled 2016-10-11: qty 1

## 2016-10-11 NOTE — ED Notes (Signed)
Pt. Family sts that patient nodded that she took too much anxiety medication.

## 2016-10-11 NOTE — Progress Notes (Signed)
ANTICOAGULATION CONSULT NOTE - Follow-up Consult  Pharmacy Consult for Heparin Indication: basilar artery thrombosis  Allergies  Allergen Reactions  . Tramadol     Per pt, she got depressed, moody, and had increased back pain when she took tramadol    Patient Measurements: Height: 5\' 5"  (165.1 cm) Weight: 123 lb 7.3 oz (56 kg) IBW/kg (Calculated) : 57  Vital Signs: Temp: 98.4 F (36.9 C) (01/29 2015) Temp Source: Oral (01/29 2015) BP: 146/76 (01/29 2300) Pulse Rate: 88 (01/29 2300)  Labs:  Recent Labs  10/10/16 1035 10/10/16 1042 10/11/16 0921 10/11/16 2229  HGB 12.0 12.6 11.7*  --   HCT 36.5 37.0 36.4  --   PLT 234  --  293  --   APTT 28  --   --   --   LABPROT 13.1  --   --   --   INR 0.99  --   --   --   HEPARINUNFRC  --   --   --  0.19*  CREATININE 0.76 0.70 0.84  --     Estimated Creatinine Clearance (by C-G formula based on SCr of 0.84 mg/dL) Female: 56.7 mL/min Female: 66.7 mL/min   Assessment: 70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 Heparin level subtherapeutic (0.19) on gtt at 650 units/hr. No issues with line or bleeding reported per RN.  Goal of Therapy:  Heparin level 0.3-0.5 units/ml Monitor platelets by anticoagulation protocol: Yes   Plan:  Increase heparin to 750 units/hr F/u 6 hr heparin level  Sherlon Handing, PharmD, BCPS Clinical pharmacist, pager [phone] 10/11/2016,11:55 PM

## 2016-10-11 NOTE — ED Notes (Signed)
Patient at MRI 

## 2016-10-11 NOTE — ED Notes (Signed)
Daughter at bedside tearful. RN updated her at this time. Pt. Still non-verbal.

## 2016-10-11 NOTE — ED Notes (Signed)
Taken to MRI 

## 2016-10-11 NOTE — Progress Notes (Addendum)
Stroke Neurology Admission Note  Chief complains: right sided weakness and slurry speech, fluctuating   The history was obtained from the patient and her daughter. During history and examination, all items was assessed unless otherwise noted.  History of Present Illness:  Mercedes Gardner is a 71 y.o. female with PMH of HTN, smoker, anxiety, gout, and unintentional weight loss since September 2017. She presented to ER yesterday with 5-6 episodes of left facial drooping and numbness, left sided weakness and slurred speech started two days ago. In ER her was asymptomatic. CT of the head negative but revealed possibility of sinusitis so patient was discharged on Augmentin. She has a lot of stress and family issues at home so it was also considered may be related to stress. She was discharged home without further workup. This morning her son noticed recurrence of similar symptoms of left facial droop, slurred speech and left side weakness. EMS was called and on arrival her symptoms had resolved. She had vomited prior to their arrival. EMS stroke scale was negative. At our ER she was not responding to the nurse and she was vomiting clear vomitus although she was nodding appropriately. CT of the head without contrast was repeated and once again showed no acute changes. She appeared dehydrated and had a leukocytosis of 19,000. However, later in ED, she had again left facial droop, slurry speech, left sided hemiplegia. Neurology was called for evaluation. 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 On further questioning, pt and daughter stated that her symptoms never really resolved for the last two days, fluctuating better and worse, and currently worsened again. Heparin IV started and she was admitted to ICU for fluctuating BA thrombosis.   LSN: 2 days ago, never really back to normal for the last 2 days as per pt and daughter tPA Given: No: out of window  Past Medical History:  Diagnosis Date  . Anemia   .  Anxiety   . Arthritis    knees   . Dysrhythmia    heart skips a beat   . GERD (gastroesophageal reflux disease)   . Gout   . Hyperlipidemia April 2014  . Hypertension   . Shortness of breath dyspnea    due to pressure of cyst per patient     Past Surgical History:  Procedure Laterality Date  . CESAREAN SECTION     x3  . OVARIAN CYST SURGERY Right 1980's  . SALPINGOOPHORECTOMY Bilateral 02/06/2015   Procedure: [facility] Stall LAPAROTOMY/BILATERAL SALPINGO OOPHORECTOMY;  Surgeon: Everitt Amber, MD;  Location: [facility];  Service: Gynecology;  Laterality: Bilateral;    Family History  Problem Relation Age of Onset  . Cancer Mother   . Hypertension Mother   . Diabetes Mother   . Diabetes Sister   . Hypertension Sister     Social History:  reports that she has been smoking Cigarettes.  She has been smoking about 0.02 packs per day. She has never used smokeless tobacco. She reports that she does not drink alcohol or use drugs.  Allergies:  Allergies  Allergen Reactions  . Tramadol     Per pt, she got depressed, moody, and had increased back pain when she took tramadol    No current facility-administered medications on file prior to encounter.    Current Outpatient Prescriptions on File Prior to Encounter  Medication Sig Dispense Refill  . acetaminophen (TYLENOL) 500 MG tablet Take 2 tablets (1,000 mg total) by mouth every 6 (six) hours. 30 tablet 0  .  acetaminophen-codeine (TYLENOL #3) 300-30 MG per tablet   0  . amLODipine (NORVASC) 10 MG tablet Take 10 mg by mouth every morning.     [name] Kitchen amoxicillin-clavulanate (AUGMENTIN) 875-125 MG tablet Take 1 tablet by mouth 2 (two) times daily. 14 tablet 0  . busPIRone (BUSPAR) 10 MG tablet Take 10 mg by mouth 2 (two) times daily.     . clobetasol ointment (TEMOVATE) [id number] % Apply 1 application topically 2 (two) times daily. 60 g 0  . colchicine 0.6 MG tablet Take 1 tablet (0.6 mg total) by mouth daily. 30 tablet 0  . diclofenac sodium  (VOLTAREN) 1 % GEL Apply 4 g topically 4 (four) times daily. To both knees, please instruct in dosing. (Patient taking differently: Apply 4 g topically 4 (four) times daily as needed (PAIN APPLIES TO BOTH KNEES). ) 3 Tube 0  . fexofenadine-pseudoephedrine (ALLEGRA-D) 60-120 MG per tablet Take 1 tablet by mouth 2 (two) times daily as needed (ALLERGIES).    . fluticasone (FLONASE) 50 MCG/ACT nasal spray Place 2 sprays into both nostrils 2 (two) times daily.     . Gauze Pads & Dressings (GAUZE DRESSING) 4"X4" PADS 1 packet by Does not apply route 2 (two) times daily. 60 each 1  . Gauze Pads & Dressings (GAUZE DRESSING) 4"X4" PADS 1 Device by Does not apply route 2 (two) times daily. 40 each 0  . ibuprofen (ADVIL,MOTRIN) 800 MG tablet Take 1 tablet (800 mg total) by mouth 3 (three) times daily. 30 tablet 0  . loratadine (CLARITIN) 10 MG tablet Take 10 mg by mouth every morning.     [name] Kitchen omeprazole (PRILOSEC) 20 MG capsule Take 20 mg by mouth every morning.   0  . OVER THE COUNTER MEDICATION Place 2 drops into both eyes daily as needed (DRY EYES, RED EYES).    [name] Kitchen oxyCODONE (OXY IR/ROXICODONE) 5 MG immediate release tablet Take 1 tablet (5 mg total) by mouth every 4 (four) hours as needed for severe pain. Do not take and drive 30 tablet 0  . fluconazole (DIFLUCAN) 100 MG tablet Take 1 tablet (100 mg total) by mouth daily. (Patient not taking: Reported on 10/11/2016) 10 tablet 0  . indomethacin (INDOCIN SR) 75 MG CR capsule Take 1 capsule (75 mg total) by mouth 2 (two) times daily with a meal. 60 capsule 0  . indomethacin (INDOCIN) 50 MG capsule Take 50 mg by mouth 3 (three) times daily as needed for mild pain.   0  . ipratropium (ATROVENT) 0.06 % nasal spray Place 2 sprays into both nostrils 4 (four) times daily. 15 mL 1  . magnesium hydroxide (MILK OF MAGNESIA) 400 MG/5ML suspension Take 30 mLs by mouth every 8 (eight) hours. 360 mL 0  . meloxicam (MOBIC) 7.5 MG tablet Take 1 tablet (7.5 mg total) by mouth  daily. 10 tablet 0  . Misc. Devices (CANE) MISC 1 Device by Does not apply route once. (Patient not taking: Reported on 10/11/2016) 1 each 0  . neomycin-bacitracin-polymyxin (NEOSPORIN) ointment Apply 1 application topically every 12 (twelve) hours. apply to abdomnal wound (Patient not taking: Reported on 10/11/2016) 15 g 0  . NICORETTE 4 MG gum Take 1 each (4 mg total) by mouth 3 (three) times daily. (Patient not taking: Reported on 10/11/2016) 100 tablet 0  . predniSONE (DELTASONE) 10 MG tablet Take 6 tablets on day 1, 5 on day 2, 4 on day 3, 3 on day 4, 2 on day 5, and 1 on day 6. (Patient  not taking: Reported on 10/11/2016) 21 tablet 0  . [DISCONTINUED] lovastatin (MEVACOR) 20 MG tablet Take 20 mg by mouth at bedtime.      Review of Systems: A full ROS was attempted today and was able to be performed.  Systems assessed include - Constitutional, Eyes, HENT, Respiratory, Cardiovascular, Gastrointestinal, Genitourinary, Integument/breast, Hematologic/lymphatic, Musculoskeletal, Neurological, Behavioral/Psych, Endocrine,  Allergic/Immunologic - with pertinent responses as per HPI.  Physical Examination: Temp:  [98.2 F (36.8 C)] 98.2 F (36.8 C) (01/29 1222) Pulse Rate:  [76-88] 85 (01/29 1345) Resp:  [16-26] 25 (01/29 1000) BP: (102-154)/(59-98) 102/69 (01/29 1345) SpO2:  [100 %] 100 % (01/29 1345) Weight:  [123 lb (55.8 kg)] 123 lb (55.8 kg) (01/29 0902)  General - The patient's general appearance was well nourished, well developed, in no apparent distress.    Ophthalmologic - fundi not visualized due to noncooperation.    Cardiovascular - Ausculation of the heart revealed regular rate  Mental Status -  Level of arousal and orientation to time, place, and person were intact. Language including expression, naming, repetition, comprehension was found intact, however, moderate dysarthria. Fund of Knowledge was assessed and was intact.  Cranial Nerves II - XII - II - Vision intact  OU. III, IV, VI - Extraocular movements intact. V - Facial sensation intact bilaterally. VII - mild left facial droop. VIII - Hearing & vestibular intact bilaterally. X - Palate elevates symmetrically, moderate dysarthria. XI - Chin turning & shoulder shrug intact bilaterally. XII - Tongue protrusion intact.  Motor Strength - The patient's strength was normal RUE and RLE, however, left UE 1/5 and left LE 2/5.   Motor Tone & Bulk - Muscle tone was assessed at the neck and appendages and increased on the left.  Bulk was normal and fasciculations were absent.   Reflexes - The patient's reflexes were normal in all extremities and she had no pathological reflexes.  Sensory - Light touch, temperature/pinprick were assessed and were normal.    Coordination - The patient had normal movements in the right hand with no ataxia or dysmetria.  Tremor was absent.  Gait and Station - not able to test.  Data Reviewed: I have personally reviewed the radiological images below and agree with the radiology interpretations.  Ct Angio Head and neck W Or Wo Contrast 10/11/2016 1. 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 No visualized acute infarct. 2. Cervical carotid atherosclerosis without flow limiting stenosis. Moderate left cavernous ICA atheromatous narrowing. 3. Mild borderline moderate left subclavian origin stenosis. 4. Micronodules in the bilateral lungs, suspect respiratory bronchiolitis in this smoker. Emphysema is present. Full noncontrast chest CT recommended after convalescence.    Dg Chest 2 View 10/10/2016 IMPRESSION: No active cardiopulmonary disease.   Ct Head Wo Contrast 10/11/2016 IMPRESSION: Normal head CT.  10/10/2016 IMPRESSION: 1. Negative CT head 2. ASPECTS is 10   MRI pending    Assessment: 71 y.o. female with hx of HTN, smoker, anxiety, gout, and unintentional weight loss since September 2017 presented to ER for 5-6 episodes of left facial  drooping and numbness, left sided weakness and slurred speech started two days ago. CT of the head no acute changes. As per patient and family, her symptoms never resolved back to normal, however fluctuating better or worse over the time. Initially ER patient symptoms improved, however later symptoms returned. 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 Heparin IV started and she was admitted to ICU for fluctuating BA thrombosis. MRI pending.  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  Rosalin Hawking, MD PhD Stroke Neurology 10/11/2016 4:46 PM  This patient is critically ill due to [facility] thrombosis with fluctuating symptoms and at significant risk of neurological worsening, death form basilar artery occlusion and brainstem infarct. This patient's care requires constant monitoring of vital signs, hemodynamics, respiratory and cardiac monitoring, review of multiple databases, neurological assessment, discussion with family, other specialists and medical decision making of high complexity. I spent 50 minutes of neurocritical care time in the care of this patient.

## 2016-10-11 NOTE — Progress Notes (Signed)
Pt c/o indigestion/reflux. Pt had failed swallow screen in ED. Dr. Cheral Marker gave order for one time dose pepcid. Will monitor.

## 2016-10-11 NOTE — ED Notes (Signed)
Attempted report x1. 

## 2016-10-11 NOTE — ED Provider Notes (Addendum)
[facility] DEPT Provider Note   CSN: [id number] Arrival date & time: 10/11/16  0850     History   Chief Complaint Chief Complaint  Patient presents with  . Weakness    HPI TESLYN DELAHOZ is a 70 y.o. female.  Patient presents via ems with concern possible stroke vs tia.  They note patient with slurred speech and ?left weakness that patient awoke with today, and which had appeared to resolve.  Last normal/at baseline appears to be last pm, although report of similar symptoms yesterday as well.  Patient is very poor, difficult historian, currently not attempting to speak or respond to questions ?aphasia - level 5 caveat.  Pt does move bil extremities purposefully. No report of trauma or fall. RN indicates patient in ED yesterday with similar symptoms that were felt possibly related to anxiety. Report of not eating/drinking well, and relatively poor po intake.     The history is provided by the patient and the EMS personnel. The history is limited by the condition of the patient.  Weakness     Past Medical History:  Diagnosis Date  . Anemia   . Anxiety   . Arthritis    knees   . Dysrhythmia    heart skips a beat   . GERD (gastroesophageal reflux disease)   . Gout   . Hyperlipidemia April 2014  . Hypertension   . Shortness of breath dyspnea    due to pressure of cyst per patient     Patient Active Problem List   Diagnosis Date Noted  . Pelvic mass in female 12/26/2014  . GOUT 12/24/2008  . ALLERGIC RHINITIS 12/20/2008  . KNEE PAIN, LEFT 12/20/2008  . TOBACCO ABUSE 07/29/2006  . HYPERTENSION 07/29/2006  . HEMORRHOIDS 07/29/2006    Past Surgical History:  Procedure Laterality Date  . CESAREAN SECTION     x3  . OVARIAN CYST SURGERY Right 1980's  . SALPINGOOPHORECTOMY Bilateral 02/06/2015   Procedure: [facility] Stall LAPAROTOMY/BILATERAL SALPINGO OOPHORECTOMY;  Surgeon: Everitt Amber, MD;  Location: [facility];  Service: Gynecology;  Laterality: Bilateral;    OB  History    Gravida Para Term Preterm AB Living   4 3 3   1      SAB TAB Ectopic Multiple Live Births                   Home Medications    Prior to Admission medications   Medication Sig Start Date End Date Taking? Authorizing Provider  acetaminophen (TYLENOL) 500 MG tablet Take 2 tablets (1,000 mg total) by mouth every 6 (six) hours. 02/07/15   Everitt Amber, MD  acetaminophen-codeine (TYLENOL #3) 300-30 MG per tablet  02/20/15   Historical Provider, MD  amLODipine (NORVASC) 10 MG tablet Take 10 mg by mouth every morning.     Historical Provider, MD  amoxicillin-clavulanate (AUGMENTIN) 875-125 MG tablet Take 1 tablet by mouth 2 (two) times daily. 10/10/16   Deno Etienne, DO  busPIRone (BUSPAR) 10 MG tablet Take 10 mg by mouth 3 (three) times daily.    Historical Provider, MD  clobetasol ointment (TEMOVATE) [id number] % Apply 1 application topically 2 (two) times daily. 02/07/16   Melony Overly, MD  colchicine 0.6 MG tablet Take 1 tablet (0.6 mg total) by mouth daily. Patient taking differently: Take 0.6 mg by mouth 2 (two) times daily.  05/17/14   Etta Quill, NP  diclofenac sodium (VOLTAREN) 1 % GEL Apply 4 g topically 4 (four) times daily. To both knees, please  instruct in dosing. Patient taking differently: Apply 4 g topically 4 (four) times daily as needed (PAIN APPLIES TO BOTH KNEES).  10/13/14   Billy Fischer, MD  fexofenadine-pseudoephedrine (ALLEGRA-D) 60-120 MG per tablet Take 1 tablet by mouth 2 (two) times daily as needed (ALLERGIES).    Historical Provider, MD  fluconazole (DIFLUCAN) 100 MG tablet Take 1 tablet (100 mg total) by mouth daily. 02/07/16   Melony Overly, MD  fluticasone (FLONASE) 50 MCG/ACT nasal spray Place 2 sprays into both nostrils 2 (two) times daily.  04/17/14   Audelia Hives Presson, PA  Gauze Pads & Dressings (GAUZE DRESSING) 4"X4" PADS 1 packet by Does not apply route 2 (two) times daily. 02/07/15   Everitt Amber, MD  Gauze Pads & Dressings (GAUZE DRESSING) 4"X4" PADS 1 Device by  Does not apply route 2 (two) times daily. 02/26/15   Everitt Amber, MD  ibuprofen (ADVIL,MOTRIN) 800 MG tablet Take 1 tablet (800 mg total) by mouth 3 (three) times daily. 02/07/15   Everitt Amber, MD  indomethacin (INDOCIN SR) 75 MG CR capsule Take 1 capsule (75 mg total) by mouth 2 (two) times daily with a meal. 07/14/16   Lysbeth Penner, FNP  indomethacin (INDOCIN) 50 MG capsule Take 50 mg by mouth 3 (three) times daily as needed for mild pain.  12/11/14   Historical Provider, MD  ipratropium (ATROVENT) 0.06 % nasal spray Place 2 sprays into both nostrils 4 (four) times daily. 06/12/15   Billy Fischer, MD  loratadine (CLARITIN) 10 MG tablet Take 10 mg by mouth every morning.  04/17/14   Audelia Hives Presson, PA  magnesium hydroxide (MILK OF MAGNESIA) 400 MG/5ML suspension Take 30 mLs by mouth every 8 (eight) hours. 02/07/15   Everitt Amber, MD  meloxicam (MOBIC) 7.5 MG tablet Take 1 tablet (7.5 mg total) by mouth daily. 11/21/15   Veatrice Kells, MD  Misc. Devices (CANE) MISC 1 Device by Does not apply route once. 02/07/15   Everitt Amber, MD  neomycin-bacitracin-polymyxin (NEOSPORIN) ointment Apply 1 application topically every 12 (twelve) hours. apply to abdomnal wound 02/26/15   Everitt Amber, MD  NICORETTE 4 MG gum Take 1 each (4 mg total) by mouth 3 (three) times daily. 02/26/15   Everitt Amber, MD  omeprazole (PRILOSEC) 20 MG capsule Take 20 mg by mouth every morning.  12/10/14   Historical Provider, MD  OVER THE COUNTER MEDICATION Place 2 drops into both eyes daily as needed (DRY EYES, RED EYES).    Historical Provider, MD  oxyCODONE (OXY IR/ROXICODONE) 5 MG immediate release tablet Take 1 tablet (5 mg total) by mouth every 4 (four) hours as needed for severe pain. Do not take and drive [id number]   Dorothyann Gibbs, NP  predniSONE (DELTASONE) 10 MG tablet Take 6 tablets on day 1, 5 on day 2, 4 on day 3, 3 on day 4, 2 on day 5, and 1 on day 6. 02/07/16   Melony Overly, MD    Family History Family History  Problem Relation  Age of Onset  . Cancer Mother   . Hypertension Mother   . Diabetes Mother   . Diabetes Sister   . Hypertension Sister     Social History Social History  Substance Use Topics  . Smoking status: Current Some Day Smoker    Packs/day: 0.02    Types: Cigarettes  . Smokeless tobacco: Never Used     Comment: 2 cigarettes per pday on 01/31/15    .  Alcohol use No     Allergies   Tramadol   Review of Systems Review of Systems  Unable to perform ROS: Patient unresponsive  Neurological: Positive for weakness.  Patient does not respond verbally to questions - level 5 caveat   Physical Exam Updated Vital Signs BP 154/98 (BP Location: Right Arm)   Pulse 88   Temp 98.2 F (36.8 C) (Oral)   Resp 22   Ht 5\' 5"  (1.651 m)   Wt 55.8 kg   SpO2 100%   BMI 20.47 kg/m   Physical Exam  Constitutional: She appears well-developed and well-nourished. No distress.  HENT:  Head: Atraumatic.  Mouth/Throat: Oropharynx is clear and moist.  Eyes: Conjunctivae and EOM are normal. Pupils are equal, round, and reactive to light. No scleral icterus.  Neck: Neck supple. No tracheal deviation present.  No bruits. No stiffness or rigidity.  Cardiovascular: Normal rate, regular rhythm, normal heart sounds and intact distal pulses.  Exam reveals no gallop and no friction rub.   No murmur heard. Pulmonary/Chest: Effort normal and breath sounds normal. No respiratory distress.  Abdominal: Soft. Normal appearance and bowel sounds are normal. She exhibits no distension. There is no tenderness.  Genitourinary:  Genitourinary Comments: No cva tenderness  Musculoskeletal: She exhibits no edema.  Neurological: She is alert.  Alert appearing. Patient does not attempt to speak or vocalize. ?aphasia.   Moves bil extremities purposefully with good strength. sens grossly intact bil.   Skin: Skin is warm and dry. No rash noted. She is not diaphoretic.  Psychiatric:  Anxious appearing.   Nursing note and vitals  reviewed.    ED Treatments / Results  Labs (all labs ordered are listed, but only abnormal results are displayed) Results for orders placed or performed during the hospital encounter of 10/11/16  CBC  Result Value Ref Range   WBC 19.1 (H) 4.0 - 10.5 K/uL   RBC 3.87 3.87 - 5.11 MIL/uL   Hemoglobin 11.7 (L) 12.0 - 15.0 g/dL   HCT 36.4 36.0 - 46.0 %   MCV 94.1 78.0 - 100.0 fL   MCH 30.2 26.0 - 34.0 pg   MCHC 32.1 30.0 - 36.0 g/dL   RDW 12.9 11.5 - 15.5 %   Platelets 293 150 - 400 K/uL  Comprehensive metabolic panel  Result Value Ref Range   Sodium 141 135 - 145 mmol/L   Potassium 2.6 (LL) 3.5 - 5.1 mmol/L   Chloride 106 101 - 111 mmol/L   CO2 23 22 - 32 mmol/L   Glucose, Bld 167 (H) 65 - 99 mg/dL   BUN 13 6 - 20 mg/dL   Creatinine, Ser 0.84 0.44 - 1.00 mg/dL   Calcium 10.1 8.9 - 10.3 mg/dL   Total Protein 7.3 6.5 - 8.1 g/dL   Albumin 3.6 3.5 - 5.0 g/dL   AST 19 15 - 41 U/L   ALT 16 14 - 54 U/L   Alkaline Phosphatase 106 38 - 126 U/L   Total Bilirubin 0.6 0.3 - 1.2 mg/dL   GFR calc non Af Amer >60 >60 mL/min   GFR calc Af Amer >60 >60 mL/min   Anion gap 12 5 - 15  Urinalysis, Routine w reflex microscopic  Result Value Ref Range   Color, Urine YELLOW YELLOW   APPearance CLEAR CLEAR   Specific Gravity, Urine 1.015 1.005 - 1.030   pH 6.0 5.0 - 8.0   Glucose, UA 50 (A) NEGATIVE mg/dL   Hgb urine dipstick SMALL (A) NEGATIVE  Bilirubin Urine NEGATIVE NEGATIVE   Ketones, ur 20 (A) NEGATIVE mg/dL   Protein, ur 30 (A) NEGATIVE mg/dL   Nitrite NEGATIVE NEGATIVE   Leukocytes, UA NEGATIVE NEGATIVE   RBC / HPF 0-5 0 - 5 RBC/hpf   WBC, UA 0-5 0 - 5 WBC/hpf   Bacteria, UA RARE (A) NONE SEEN   Squamous Epithelial / LPF 0-5 (A) NONE SEEN  Ethanol  Result Value Ref Range   Alcohol, Ethyl (B) <5 <5 mg/dL  Rapid urine drug screen (hospital performed)  Result Value Ref Range   Opiates POSITIVE (A) NONE DETECTED   Cocaine NONE DETECTED NONE DETECTED   Benzodiazepines NONE  DETECTED NONE DETECTED   Amphetamines NONE DETECTED NONE DETECTED   Tetrahydrocannabinol NONE DETECTED NONE DETECTED   Barbiturates NONE DETECTED NONE DETECTED  Acetaminophen level  Result Value Ref Range   Acetaminophen (Tylenol), Serum <10 (L) 10 - 30 ug/mL  Salicylate level  Result Value Ref Range   Salicylate Lvl [id number] 2.8 - 30.0 mg/dL  Magnesium  Result Value Ref Range   Magnesium 2.2 1.7 - 2.4 mg/dL   Dg Chest 2 View  Result Date: 10/10/2016 CLINICAL DATA:  Ataxia, weight loss EXAM: CHEST  2 VIEW COMPARISON:  11/21/2015 FINDINGS: Heart and mediastinal contours are within normal limits. No focal opacities or effusions. No acute bony abnormality. Nipple shadows project over the lower lungs. IMPRESSION: No active cardiopulmonary disease. Electronically Signed   By: Rolm Baptise M.D.   On: 10/10/2016 11:25   Ct Head Wo Contrast  Result Date: 10/11/2016 CLINICAL DATA:  Left-sided weakness, facial droop, slurred speech. EXAM: CT HEAD WITHOUT CONTRAST TECHNIQUE: Contiguous axial images were obtained from the base of the skull through the vertex without intravenous contrast. COMPARISON:  CT scan of October 10, 2016. FINDINGS: Brain: No evidence of acute infarction, hemorrhage, hydrocephalus, extra-axial collection or mass lesion/mass effect. Vascular: No hyperdense vessel or unexpected calcification. Skull: Normal. Negative for fracture or focal lesion. Sinuses/Orbits: No acute finding. Other: None. IMPRESSION: Normal head CT. Electronically Signed   By: Marijo Conception, M.D.   On: 10/11/2016 10:41   Dg Chest Port 1 View  Result Date: 10/11/2016 CLINICAL DATA:  71 year old female with weakness and confusion. Subsequent encounter. EXAM: PORTABLE CHEST 1 VIEW COMPARISON:  10/10/2016 and 01/31/2015 chest x-ray. FINDINGS: No infiltrate, congestive heart failure or pneumothorax. No plain film evidence of pulmonary malignancy. Heart size top-normal. Tortuous aorta which is partially calcified. No  acute osseous abnormality. Acromioclavicular joint degenerative changes. IMPRESSION: No active disease. Electronically Signed   By: Genia Del M.D.   On: 10/11/2016 11:44   Ct Head Code Stroke W/o Cm  Result Date: 10/10/2016 CLINICAL DATA:  Code stroke.  Left facial droop and slurred speech EXAM: CT HEAD WITHOUT CONTRAST TECHNIQUE: Contiguous axial images were obtained from the base of the skull through the vertex without intravenous contrast. COMPARISON:  None. FINDINGS: Brain: No evidence of acute infarction, hemorrhage, hydrocephalus, extra-axial collection or mass lesion/mass effect. Vascular: No hyperdense vessel or unexpected calcification. Skull: Negative Sinuses/Orbits: Mild mucosal edema paranasal sinuses.  Normal orbit. Other: None ASPECTS ([place] Stroke Program Early CT Score) - Ganglionic level infarction (caudate, lentiform nuclei, internal capsule, insula, M1-M3 cortex): 7 - Supraganglionic infarction (M4-M6 cortex): 3 Total score (0-10 with 10 being normal): 10 IMPRESSION: 1. Negative CT head 2. ASPECTS is 10 These results were called by telephone at the time of interpretation on 10/10/2016 at 10:56 am to Dr. Shon Hale, who verbally acknowledged these results. Electronically  Signed   By: Franchot Gallo M.D.   On: 10/10/2016 10:57    EKG  EKG Interpretation None       Radiology Dg Chest 2 View  Result Date: 10/10/2016 CLINICAL DATA:  Ataxia, weight loss EXAM: CHEST  2 VIEW COMPARISON:  11/21/2015 FINDINGS: Heart and mediastinal contours are within normal limits. No focal opacities or effusions. No acute bony abnormality. Nipple shadows project over the lower lungs. IMPRESSION: No active cardiopulmonary disease. Electronically Signed   By: Rolm Baptise M.D.   On: 10/10/2016 11:25   Ct Head Code Stroke W/o Cm  Result Date: 10/10/2016 CLINICAL DATA:  Code stroke.  Left facial droop and slurred speech EXAM: CT HEAD WITHOUT CONTRAST TECHNIQUE: Contiguous axial images were obtained from  the base of the skull through the vertex without intravenous contrast. COMPARISON:  None. FINDINGS: Brain: No evidence of acute infarction, hemorrhage, hydrocephalus, extra-axial collection or mass lesion/mass effect. Vascular: No hyperdense vessel or unexpected calcification. Skull: Negative Sinuses/Orbits: Mild mucosal edema paranasal sinuses.  Normal orbit. Other: None ASPECTS ([place] Stroke Program Early CT Score) - Ganglionic level infarction (caudate, lentiform nuclei, internal capsule, insula, M1-M3 cortex): 7 - Supraganglionic infarction (M4-M6 cortex): 3 Total score (0-10 with 10 being normal): 10 IMPRESSION: 1. Negative CT head 2. ASPECTS is 10 These results were called by telephone at the time of interpretation on 10/10/2016 at 10:56 am to Dr. Shon Hale, who verbally acknowledged these results. Electronically Signed   By: Franchot Gallo M.D.   On: 10/10/2016 10:57    Procedures Procedures (including critical care time)  Medications Ordered in ED Medications  sodium chloride 0.9 % bolus 500 mL (not administered)     Initial Impression / Assessment and Plan / ED Course  I have reviewed the triage vital signs and the nursing notes.  Pertinent labs & imaging results that were available during my care of the patient were reviewed by me and considered in my medical decision making (see chart for details).  Reviewed nursing notes and prior charts for additional history.   EMS/staff notes symptoms have improved significantly.   Family arrives, indicates patient did take extra of her anxiety meds today, unsure how much.   Labs. Imaging.   k very low, 2.6,  Mg added to labs. kcl iv.  Given change in speech, and ?earlier left sided weakness, low k, will admit for cva workup and replacement k.  Hospitalists consulted.   When hospitalists came to eval in ED, return of new left sided weakness, code cva activated and neurology to see.     Final Clinical Impressions(s) / ED Diagnoses    Final diagnoses:  None    New Prescriptions New Prescriptions   No medications on file         Lajean Saver, MD 10/11/16 1553

## 2016-10-11 NOTE — Progress Notes (Signed)
ANTICOAGULATION CONSULT NOTE - Initial Consult  Pharmacy Consult for Heparin Indication: basilar artery thrombosis  Allergies  Allergen Reactions  . Tramadol     Per pt, she got depressed, moody, and had increased back pain when she took tramadol    Patient Measurements: Height: 5\' 5"  (165.1 cm) Weight: 123 lb (55.8 kg) IBW/kg (Calculated) : 57  Vital Signs: Temp: 98.2 F (36.8 C) (01/29 1222) Temp Source: Oral (01/29 0859) BP: 102/69 (01/29 1345) Pulse Rate: 85 (01/29 1345)  Labs:  Recent Labs  10/10/16 1035 10/10/16 1042 10/11/16 0921  HGB 12.0 12.6 11.7*  HCT 36.5 37.0 36.4  PLT 234  --  293  APTT 28  --   --   LABPROT 13.1  --   --   INR 0.99  --   --   CREATININE 0.76 0.70 0.84    Estimated Creatinine Clearance (by C-G formula based on SCr of 0.84 mg/dL) Female: 56.5 mL/min Female: 66.4 mL/min   Medical History: Past Medical History:  Diagnosis Date  . Anemia   . Anxiety   . Arthritis    knees   . Dysrhythmia    heart skips a beat   . GERD (gastroesophageal reflux disease)   . Gout   . Hyperlipidemia April 2014  . Hypertension   . Shortness of breath dyspnea    due to pressure of cyst per patient    Medications: No anticoagulants pta  Assessment: 70yof who had been experiencing 2 days of TIA symptoms. Symptoms worsened today. CT head negative. CTA head showed basilar artery thrombosis. She will begin heparin.   Goal of Therapy:  Heparin level 0.3-0.5 units/ml Monitor platelets by anticoagulation protocol: Yes   Plan:  1) Begin heparin at 650 units/hr, no bolus 2) Check 6 hour heparin level 3) Daily heparin level and CBC  Deboraha Sprang 10/11/2016,3:44 PM

## 2016-10-11 NOTE — ED Notes (Signed)
PT went to CT  

## 2016-10-11 NOTE — ED Triage Notes (Signed)
Pt. Coming from home via [facility] for left-sided weakness, facial droop, numbness, and slurred speech. All of her symptoms were resolved by the time EMS arrived. Pt. sts she vomited and everything got better. EMS sts she was negative on their stroke scale. Once patient arrived patient was not responding to RN. EDP made aware and at bedside. Pt. Vomiting clear mucus. Pt. Nods appropriately.

## 2016-10-11 NOTE — ED Notes (Signed)
Patient taken to CT.

## 2016-10-11 NOTE — ED Notes (Signed)
PT vomited. Pt started talking asked for water of ice. I told Pt not at this time. Pt being telling me that she still has vomit in nose and mouth.

## 2016-10-11 NOTE — Progress Notes (Signed)
Pharmacy Antibiotic Note  Mercedes Gardner is a 71 y.o. female admitted on 10/11/2016 with pneumonia.  Pharmacy has been consulted for zosyn dosing.  Plan: Zosyn 3.375g IV q8h (4 hour infusion).  Monitor culture data, renal function and clinical course  Height: 5\' 5"  (165.1 cm) Weight: 123 lb (55.8 kg) IBW/kg (Calculated) : 57  Temp (24hrs), Avg:98.2 F (36.8 C), Min:98.2 F (36.8 C), Max:98.2 F (36.8 C)   Recent Labs Lab 10/10/16 1035 10/10/16 1042 10/11/16 0921  WBC 9.5  --  19.1*  CREATININE 0.76 0.70 0.84    Estimated Creatinine Clearance (by C-G formula based on SCr of 0.84 mg/dL) Female: 56.5 mL/min Female: 66.4 mL/min    Allergies  Allergen Reactions  . Tramadol     Per pt, she got depressed, moody, and had increased back pain when she took tramadol     Andrey Cota. Diona Foley, PharmD, BCPS Clinical Pharmacist 10/11/2016 1:00 PM
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
## 2016-10-11 NOTE — ED Notes (Signed)
Patient is talking, slurred speech noted. Patient states she took her buspirone along with her bp and GERD meds this AM.

## 2016-10-12 ENCOUNTER — Inpatient Hospital Stay (HOSPITAL_COMMUNITY): Payer: Medicare Other

## 2016-10-12 ENCOUNTER — Encounter (HOSPITAL_COMMUNITY): Payer: Self-pay | Admitting: Radiology

## 2016-10-12 ENCOUNTER — Other Ambulatory Visit (HOSPITAL_COMMUNITY): Payer: Medicare Other

## 2016-10-12 DIAGNOSIS — D72829 Elevated white blood cell count, unspecified: Secondary | ICD-10-CM

## 2016-10-12 DIAGNOSIS — R627 Adult failure to thrive: Secondary | ICD-10-CM

## 2016-10-12 DIAGNOSIS — R471 Dysarthria and anarthria: Secondary | ICD-10-CM

## 2016-10-12 DIAGNOSIS — Z8673 Personal history of transient ischemic attack (TIA), and cerebral infarction without residual deficits: Secondary | ICD-10-CM

## 2016-10-12 DIAGNOSIS — G8929 Other chronic pain: Secondary | ICD-10-CM

## 2016-10-12 DIAGNOSIS — R911 Solitary pulmonary nodule: Secondary | ICD-10-CM

## 2016-10-12 DIAGNOSIS — I6330 Cerebral infarction due to thrombosis of unspecified cerebral artery: Secondary | ICD-10-CM

## 2016-10-12 DIAGNOSIS — R29898 Other symptoms and signs involving the musculoskeletal system: Secondary | ICD-10-CM

## 2016-10-12 DIAGNOSIS — F17200 Nicotine dependence, unspecified, uncomplicated: Secondary | ICD-10-CM

## 2016-10-12 DIAGNOSIS — R531 Weakness: Secondary | ICD-10-CM

## 2016-10-12 DIAGNOSIS — R634 Abnormal weight loss: Secondary | ICD-10-CM

## 2016-10-12 DIAGNOSIS — D649 Anemia, unspecified: Secondary | ICD-10-CM

## 2016-10-12 DIAGNOSIS — I6322 Cerebral infarction due to unspecified occlusion or stenosis of basilar arteries: Secondary | ICD-10-CM

## 2016-10-12 LAB — LIPID PANEL
CHOL/HDL RATIO: 4.5 ratio
Cholesterol: 190 mg/dL (ref 0–200)
HDL: 42 mg/dL (ref >40)
LDL Cholesterol: 129 mg/dL — ABNORMAL HIGH (ref 0–99)
Triglycerides: 97 mg/dL (ref <150)
VLDL: 19 mg/dL (ref 0–40)

## 2016-10-12 LAB — VAS US CAROTID
LCCAPDIAS: 24 cm/s
LEFT ECA DIAS: -15 cm/s
LEFT VERTEBRAL DIAS: -13 cm/s
LICAPDIAS: 12 cm/s
Left CCA dist dias: -18 cm/s
Left CCA dist sys: -59 cm/s
Left CCA prox sys: 111 cm/s
Left ICA dist dias: -31 cm/s
Left ICA dist sys: -91 cm/s
Left ICA prox sys: 51 cm/s
RCCAPDIAS: -23 cm/s
RIGHT ECA DIAS: -20 cm/s
RIGHT VERTEBRAL DIAS: 24 cm/s
Right CCA prox sys: -94 cm/s
Right cca dist sys: 81 cm/s

## 2016-10-12 LAB — COMPREHENSIVE METABOLIC PANEL
ALBUMIN: 3.2 g/dL — AB (ref 3.5–5.0)
ALK PHOS: 100 U/L (ref 38–126)
ALT: 14 U/L (ref 14–54)
AST: 16 U/L (ref 15–41)
Anion gap: 9 (ref 5–15)
BILIRUBIN TOTAL: 0.6 mg/dL (ref 0.3–1.2)
BUN: 7 mg/dL (ref 6–20)
CALCIUM: 9.8 mg/dL (ref 8.9–10.3)
CO2: 26 mmol/L (ref 22–32)
Chloride: 108 mmol/L (ref 101–111)
Creatinine, Ser: 0.71 mg/dL (ref 0.44–1.00)
GFR calc Af Amer: >60 mL/min (ref >60)
GFR calc non Af Amer: >60 mL/min (ref >60)
GLUCOSE: 102 mg/dL — AB (ref 65–99)
Potassium: 3.1 mmol/L — ABNORMAL LOW (ref 3.5–5.1)
Sodium: 143 mmol/L (ref 135–145)
TOTAL PROTEIN: 7.1 g/dL (ref 6.5–8.1)

## 2016-10-12 LAB — CBC WITH DIFFERENTIAL/PLATELET
BASOS ABS: 0 10*3/uL (ref 0.0–0.1)
Basophils Relative: 0 %
Eosinophils Absolute: 0.1 10*3/uL (ref 0.0–0.7)
Eosinophils Relative: 1 %
HEMATOCRIT: 34.6 % — AB (ref 36.0–46.0)
HEMOGLOBIN: 11.2 g/dL — AB (ref 12.0–15.0)
LYMPHS PCT: 24 %
Lymphs Abs: 1.9 10*3/uL (ref 0.7–4.0)
MCH: 30.4 pg (ref 26.0–34.0)
MCHC: 32.4 g/dL (ref 30.0–36.0)
MCV: 93.8 fL (ref 78.0–100.0)
MONOS PCT: 3 %
Monocytes Absolute: 0.2 10*3/uL (ref 0.1–1.0)
NEUTROS ABS: 5.8 10*3/uL (ref 1.7–7.7)
Neutrophils Relative %: 72 %
Platelets: 227 10*3/uL (ref 150–400)
RBC: 3.69 MIL/uL — AB (ref 3.87–5.11)
RDW: 13.2 % (ref 11.5–15.5)
WBC: 8 10*3/uL (ref 4.0–10.5)

## 2016-10-12 LAB — HIV ANTIBODY (ROUTINE TESTING W REFLEX): HIV Screen 4th Generation wRfx: NONREACTIVE

## 2016-10-12 LAB — RPR: RPR Ser Ql: NONREACTIVE

## 2016-10-12 LAB — HEPARIN LEVEL (UNFRACTIONATED)
Heparin Unfractionated: 0.28 IU/mL — ABNORMAL LOW (ref 0.30–0.70)
Heparin Unfractionated: 0.34 IU/mL (ref 0.30–0.70)

## 2016-10-12 MED ORDER — AMOXICILLIN-POT CLAVULANATE 875-125 MG PO TABS
1.0000 | ORAL_TABLET | Freq: Two times a day (BID) | ORAL | Status: DC
  Administered 2016-10-12 – 2016-10-14 (×5): 1 via ORAL
  Filled 2016-10-12 (×6): qty 1

## 2016-10-12 MED ORDER — IOPAMIDOL (ISOVUE-300) INJECTION 61%
INTRAVENOUS | Status: AC
  Administered 2016-10-12: 30 mL
  Filled 2016-10-12: qty 30

## 2016-10-12 MED ORDER — IOPAMIDOL (ISOVUE-M 300) INJECTION 61%: 15.0000 mL | Freq: Once | INTRAMUSCULAR | Status: DC | PRN

## 2016-10-12 MED ORDER — FERROUS SULFATE 325 (65 FE) MG PO TABS
325.0000 mg | ORAL_TABLET | Freq: Two times a day (BID) | ORAL | Status: DC
  Administered 2016-10-12 – 2016-10-14 (×5): 325 mg via ORAL
  Filled 2016-10-12 (×5): qty 1

## 2016-10-12 MED ORDER — POTASSIUM CHLORIDE CRYS ER 20 MEQ PO TBCR
40.0000 meq | EXTENDED_RELEASE_TABLET | ORAL | Status: AC
  Administered 2016-10-12 (×2): 40 meq via ORAL
  Filled 2016-10-12 (×2): qty 2

## 2016-10-12 MED ORDER — ATORVASTATIN CALCIUM 80 MG PO TABS
80.0000 mg | ORAL_TABLET | Freq: Every day | ORAL | Status: DC
  Administered 2016-10-12 – 2016-10-14 (×3): 80 mg via ORAL
  Filled 2016-10-12 (×3): qty 1

## 2016-10-12 MED ORDER — ATORVASTATIN CALCIUM 10 MG PO TABS: 10.0000 mg | ORAL_TABLET | Freq: Every day | ORAL | Status: DC

## 2016-10-12 MED ORDER — ENSURE ENLIVE PO LIQD
237.0000 mL | Freq: Two times a day (BID) | ORAL | Status: DC
  Administered 2016-10-12 – 2016-10-14 (×5): 237 mL via ORAL

## 2016-10-12 MED ORDER — SODIUM CHLORIDE 0.9 % IV SOLN
INTRAVENOUS | Status: DC
  Administered 2016-10-12: 11:00:00 via INTRAVENOUS

## 2016-10-12 MED ORDER — IOPAMIDOL (ISOVUE-300) INJECTION 61%
100.0000 mL | Freq: Once | INTRAVENOUS | Status: AC | PRN
  Administered 2016-10-12: 100 mL via INTRAVENOUS

## 2016-10-12 NOTE — Care Management Note (Signed)
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  PTA, pt independent, lives with son.                    Action/Plan: PT/OT recommending CIR, and consult in progress.  Will follow.    Expected Discharge Date:                  Expected Discharge Plan:  [place]  In-House Referral:     Discharge planning Services  CM Consult  Post Acute Care Choice:    Choice offered to:     DME Arranged:    DME Agency:     HH Arranged:    [place] Agency:     Status of Service:  In process, will continue to follow  If discussed at Long Length of Stay Meetings, dates discussed:    Additional Comments:  Reinaldo Raddle, RN, BSN  Trauma/Neuro ICU Case Manager [phone]

## 2016-10-12 NOTE — Evaluation (Signed)
Clinical/Bedside Swallow Evaluation Patient Details  Name: Mercedes Gardner MRN: [id number] Date of Birth: 1945/12/02  Today's Date: 10/12/2016 Time: SLP Start Time (ACUTE ONLY): [id number] SLP Stop Time (ACUTE ONLY): 0944 SLP Time Calculation (min) (ACUTE ONLY): 10 min  Past Medical History:  Past Medical History:  Diagnosis Date  . Anemia   . Anxiety   . Arthritis    knees   . Dysrhythmia    heart skips a beat   . GERD (gastroesophageal reflux disease)   . Gout   . Hyperlipidemia April 2014  . Hypertension   . Shortness of breath dyspnea    due to pressure of cyst per patient    Past Surgical History:  Past Surgical History:  Procedure Laterality Date  . CESAREAN SECTION     x3  . OVARIAN CYST SURGERY Right 1980's  . SALPINGOOPHORECTOMY Bilateral 02/06/2015   Procedure: Campbell Stall LAPAROTOMY/BILATERAL SALPINGO OOPHORECTOMY;  Surgeon: Everitt Amber, MD;  Location: [facility];  Service: Gynecology;  Laterality: Bilateral;   HPI:  71 y.o.femalewith PMH of HTN, smoker, anxiety, gout, and unintentional weight loss since September 2017. Pt initially presented to the ED 1/28 with intermittent episodes of left-sided weakness and slurred speech, but she was asymptomatic and CT Head was negative so she was d/c home. EMS was called on 1/29 due to recurrence of similar symptoms. 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 Pt and son report that her symptoms have improved since last night when she did not pass the RN stroke swallow screen. Recommend to start regular  textures and thin liquids. SLP f/u warranted to assess tolerance given location of infarcts and fluctuating performance since admission.    Aspiration Risk  Mild aspiration risk    Diet Recommendation Regular;Thin liquid   Liquid Administration via: Cup;Straw Medication Administration: Whole meds with liquid Supervision: Patient able to self feed;Intermittent supervision to cue for compensatory strategies (close) Compensations: Slow rate;Small sips/bites Postural Changes: Seated upright at 90 degrees;Remain upright for at least 30 minutes after po intake    Other  Recommendations Oral Care Recommendations: Oral care BID   Follow up Recommendations  (tba)      Frequency and Duration min 2x/week  1 week       Prognosis Prognosis for Safe Diet Advancement: Good      Swallow Study   General HPI: 71 y.o.femalewith PMH of HTN, smoker, anxiety, gout, and unintentional weight loss since September 2017. Pt initially presented to the ED 1/28 with intermittent episodes of left-sided weakness and slurred speech, but she was asymptomatic and CT Head was negative so she was d/c home. EMS was called on 1/29 due to recurrence of similar symptoms. 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  Type of Study: Bedside Swallow Evaluation Previous Swallow Assessment: none in chart Diet Prior to this Study: NPO Temperature Spikes Noted: No Respiratory Status: Room air History of Recent Intubation: No Behavior/Cognition: Alert;Cooperative;Pleasant mood Oral Cavity Assessment: Within Functional  Limits Oral Care Completed by SLP: No Oral Cavity - Dentition: Dentures, top;Dentures, bottom Vision: Functional for self-feeding Self-Feeding Abilities: Able to feed self Patient Positioning: Upright in  bed Baseline Vocal Quality: Normal Volitional Cough: Weak (mildly weak, says she "doesn't have to cough") Volitional Swallow: Able to elicit    Oral/Motor/Sensory Function Overall Oral Motor/Sensory Function: Mild impairment Facial ROM: Reduced left;Suspected CN VII (facial) dysfunction Facial Symmetry: Abnormal symmetry left;Suspected CN VII (facial) dysfunction Facial Strength: Reduced left;Suspected CN VII (facial) dysfunction   Ice Chips Ice chips: Within functional limits Presentation: Spoon   Thin Liquid Thin Liquid: Impaired Presentation: Cup;Self Fed;Straw Pharyngeal  Phase Impairments: Suspected delayed Swallow    Nectar Thick Nectar Thick Liquid: Not tested   Honey Thick Honey Thick Liquid: Not tested   Puree Puree: Impaired Presentation: Self Fed;Spoon Pharyngeal Phase Impairments: Suspected delayed Swallow   Solid   GO   Solid: Within functional limits Presentation: Self [name], [name] 10/12/2016,10:24 AM  [name], M.A. CCC-SLP [phone]

## 2016-10-12 NOTE — Progress Notes (Signed)
Inpatient Rehabilitation  PT has evaluated pt. and is recommending IP Rehab.  Patient was screened by Tyteanna Ost for appropriateness for an Inpatient Acute Rehab consult.  At this time, we are recommending Inpatient Rehab consult.  Please order consult if you are agreeable.  Anija Brickner PT Inpatient Rehab Admissions Coordinator Cell [phone] Office [phone]   

## 2016-10-12 NOTE — Consult Note (Signed)
Physical Medicine and Rehabilitation Consult Reason for Consult: Acute early subacute infarct right hemi-pons and multiple additional small foci in the right cerebellar hemisphere, left pontomedullary junction and right occipital lobe Referring Physician: Dr.Xu   HPI: Mercedes Gardner is a 71 y.o. right handed female with history of hypertension, hyperlipidemia and tobacco abuse. Presented 10/10/2016 with waxing and waning symptoms of intermittent slurred speech and left-sided weakness. Per chart review patient lives with son independent prior to admission. Works as a Print production planner. Son works from home and can assist. MRI of the brain showed acute early subacute infarction within the right hemi-pons and multiple additional small foci in the right cerebellar hemisphere, left pontomedullary junction, right occipital lobe and right paramedian parietal lobe. 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 Lower extremity Dopplers negative for DVT. Carotid Doppler showed no ICA stenosis. Patient did not receive TPA. Echocardiogram and remaining neuro workup currently ongoing. Presently on IV heparin therapy. Physical therapy evaluation completed with recommendations of physical medicine rehabilitation consult.   Review of Systems  Constitutional: Negative for chills and fever.  HENT: Negative for hearing loss and tinnitus.   Eyes: Negative for blurred vision and double vision.  Respiratory: Positive for shortness of breath. Negative for cough.   Cardiovascular: Positive for palpitations and leg swelling. Negative for chest pain.  Gastrointestinal: Positive for constipation. Negative for nausea and vomiting.  Genitourinary: Negative for dysuria, flank pain and hematuria.  Musculoskeletal: Positive for myalgias.  Skin: Negative for rash.  Neurological: Positive for sensory change and weakness. Negative for seizures.    Psychiatric/Behavioral:       Anxiety  All other systems reviewed and are negative.  Past Medical History:  Diagnosis Date  . Anemia   . Anxiety   . Arthritis    knees   . Dysrhythmia    heart skips a beat   . GERD (gastroesophageal reflux disease)   . Gout   . Hyperlipidemia April 2014  . Hypertension   . Shortness of breath dyspnea    due to pressure of cyst per patient    Past Surgical History:  Procedure Laterality Date  . CESAREAN SECTION     x3  . OVARIAN CYST SURGERY Right 1980's  . SALPINGOOPHORECTOMY Bilateral 02/06/2015   Procedure: Campbell Stall LAPAROTOMY/BILATERAL SALPINGO OOPHORECTOMY;  Surgeon: Everitt Amber, MD;  Location: [facility];  Service: Gynecology;  Laterality: Bilateral;   Family History  Problem Relation Age of Onset  . Cancer Mother   . Hypertension Mother   . Diabetes Mother   . Diabetes Sister   . Hypertension Sister    Social History:  reports that she has been smoking Cigarettes.  She has been smoking about 0.02 packs per day. She has never used smokeless tobacco. She reports that she does not drink alcohol or use drugs. Allergies:  Allergies  Allergen Reactions  . Tramadol     Per pt, she got depressed, moody, and had increased back pain when she took tramadol   Medications Prior to Admission  Medication Sig Dispense Refill  . acetaminophen (TYLENOL) 500 MG tablet Take 2 tablets (1,000 mg total) by mouth every 6 (six) hours. 30 tablet 0  . acetaminophen-codeine (TYLENOL #3) 300-30 MG per tablet   0  . acetaminophen-codeine (TYLENOL #3) 300-30 MG tablet Take 1 tablet by mouth every 4 (four) hours as needed for moderate pain.    [name] Kitchen amLODipine (NORVASC) 10 MG tablet Take 10  mg by mouth every morning.     [name] Kitchen amoxicillin-clavulanate (AUGMENTIN) 875-125 MG tablet Take 1 tablet by mouth 2 (two) times daily. 14 tablet 0  . busPIRone (BUSPAR) 10 MG tablet Take 10 mg by mouth 2 (two) times daily.     . busPIRone (BUSPAR) 5 MG tablet Take 5 mg by mouth 2  (two) times daily as needed.    . clobetasol ointment (TEMOVATE) [id number] % Apply 1 application topically 2 (two) times daily. 60 g 0  . colchicine (COLCRYS) 0.6 MG tablet Take 0.6 mg by mouth 2 (two) times daily.    . colchicine 0.6 MG tablet Take 1 tablet (0.6 mg total) by mouth daily. 30 tablet 0  . diclofenac sodium (VOLTAREN) 1 % GEL Apply 4 g topically 4 (four) times daily. To both knees, please instruct in dosing. (Patient taking differently: Apply 4 g topically 4 (four) times daily as needed (PAIN APPLIES TO BOTH KNEES). ) 3 Tube 0  . fexofenadine-pseudoephedrine (ALLEGRA-D) 60-120 MG per tablet Take 1 tablet by mouth 2 (two) times daily as needed (ALLERGIES).    . fluticasone (FLONASE) 50 MCG/ACT nasal spray Place 2 sprays into both nostrils 2 (two) times daily.     . Gauze Pads & Dressings (GAUZE DRESSING) 4"X4" PADS 1 packet by Does not apply route 2 (two) times daily. 60 each 1  . Gauze Pads & Dressings (GAUZE DRESSING) 4"X4" PADS 1 Device by Does not apply route 2 (two) times daily. 40 each 0  . ibuprofen (ADVIL,MOTRIN) 800 MG tablet Take 1 tablet (800 mg total) by mouth 3 (three) times daily. 30 tablet 0  . loratadine (CLARITIN) 10 MG tablet Take 10 mg by mouth every morning.     [name] Kitchen omeprazole (PRILOSEC) 20 MG capsule Take 20 mg by mouth every morning.   0  . OVER THE COUNTER MEDICATION Place 2 drops into both eyes daily as needed (DRY EYES, RED EYES).    [name] Kitchen oxyCODONE (OXY IR/ROXICODONE) 5 MG immediate release tablet Take 1 tablet (5 mg total) by mouth every 4 (four) hours as needed for severe pain. Do not take and drive 30 tablet 0  . fluconazole (DIFLUCAN) 100 MG tablet Take 1 tablet (100 mg total) by mouth daily. (Patient not taking: Reported on 10/11/2016) 10 tablet 0  . indomethacin (INDOCIN SR) 75 MG CR capsule Take 1 capsule (75 mg total) by mouth 2 (two) times daily with a meal. 60 capsule 0  . indomethacin (INDOCIN) 50 MG capsule Take 50 mg by mouth 3 (three) times daily as needed for  mild pain.   0  . ipratropium (ATROVENT) 0.06 % nasal spray Place 2 sprays into both nostrils 4 (four) times daily. 15 mL 1  . magnesium hydroxide (MILK OF MAGNESIA) 400 MG/5ML suspension Take 30 mLs by mouth every 8 (eight) hours. 360 mL 0  . meloxicam (MOBIC) 7.5 MG tablet Take 1 tablet (7.5 mg total) by mouth daily. 10 tablet 0  . Misc. Devices (CANE) MISC 1 Device by Does not apply route once. (Patient not taking: Reported on 10/11/2016) 1 each 0  . neomycin-bacitracin-polymyxin (NEOSPORIN) ointment Apply 1 application topically every 12 (twelve) hours. apply to abdomnal wound (Patient not taking: Reported on 10/11/2016) 15 g 0  . NICORETTE 4 MG gum Take 1 each (4 mg total) by mouth 3 (three) times daily. (Patient not taking: Reported on 10/11/2016) 100 tablet 0  . predniSONE (DELTASONE) 10 MG tablet Take 6 tablets on day 1, 5 on day  2, 4 on day 3, 3 on day 4, 2 on day 5, and 1 on day 6. (Patient not taking: Reported on 10/11/2016) 21 tablet 0    Home: Home Living Family/patient expects to be discharged to:: Inpatient rehab Living Arrangements: Children (with son) Available Help at Discharge: Family Type of Home: House Home Access: Stairs to enter Technical brewer of Steps: 3 Home Layout: One level Home Equipment: Crutches  Lives With: Son  Functional History: Prior Function Level of Independence: Independent Comments: Works as Print production planner. Functional Status:  Mobility: Bed Mobility Overal bed mobility: Needs Assistance Bed Mobility: Supine to Sit, Sit to Supine Supine to sit: HOB elevated, Min assist Sit to supine: Min assist General bed mobility comments: Assist to bring LLE out of bed and into bed. Increased time. Transfers Overall transfer level: Needs assistance Equipment used: 1 person hand held assist Transfers: Sit to/from Stand Sit to Stand: Min assist General transfer comment: Assist to power to standing and for balance due to impulsivity. Stood from [employer], from toilet x1. Stood without assist from toilet despite cues to call for help and LOB into wall. Ambulation/Gait Ambulation/Gait assistance: Mod assist, +2 safety/equipment Ambulation Distance (Feet): 12 Feet (x2 bouts) Assistive device: 1 person hand held assist Gait Pattern/deviations: Scissoring, Ataxic, Step-through pattern, Decreased step length - right, Decreased step length - left General Gait Details: Ataxic like gait pattern with incoordinated movement LLE; pt impulsive and fast with gait speed. Poor safety awareness. 1 LOB when standing from toilet without assist.     ADL:    Cognition: Cognition Overall Cognitive Status: Within Functional Limits for tasks assessed Orientation Level: Oriented X4 Cognition Arousal/Alertness: Awake/alert Behavior During Therapy: Impulsive Overall Cognitive Status: Within Functional Limits for tasks assessed  Blood pressure (!) 133/104, pulse 94, temperature 98.2 F (36.8 C), temperature source Oral, resp. rate (!) 25, height 5\' 5"  (1.651 m), weight 56 kg (123 lb 7.3 oz), SpO2 97 %. Physical Exam  HENT:  Mild left facial droop  Eyes:  Pupils round and reactive to light  Neck: Normal range of motion. Neck supple. No thyromegaly present.  Cardiovascular: Normal rate and regular rhythm.   Respiratory: Breath sounds normal. No respiratory distress.  GI: Soft. Bowel sounds are normal. She exhibits no distension.  Neurological: She is alert.  Anxious. Follows simple commands. Provides name and age as well as date of birth. Speech is mildly dysarthric but intelligible. Fair awareness of deficits. Left facial weakness. RUE and RLE 5/5.  LUE 4/5 prox to distal. LLE 4 to 4+/5. No sensory deficits  Skin: Skin is warm and dry.  Psychiatric: She has a normal mood and affect. Her behavior is normal.    Results for orders placed or performed during the hospital encounter of 10/11/16 (from the past 24 hour(s))  TSH     Status: None   Collection  Time: 10/11/16  3:37 PM  Result Value Ref Range   TSH 0.451 0.350 - 4.500 uIU/mL  Ammonia     Status: None   Collection Time: 10/11/16  3:37 PM  Result Value Ref Range   Ammonia 27 9 - 35 umol/L  Vitamin B12     Status: None   Collection Time: 10/11/16  3:37 PM  Result Value Ref Range   Vitamin B-12 351 180 - 914 pg/mL  Folate     Status: None   Collection Time: 10/11/16  3:37 PM  Result Value Ref Range   Folate 19.5 >5.9 ng/mL  Iron  and TIBC     Status: Abnormal   Collection Time: 10/11/16  3:37 PM  Result Value Ref Range   Iron 21 (L) 28 - 170 ug/dL   TIBC 302 250 - 450 ug/dL   Saturation Ratios 7 (L) 10.4 - 31.8 %   UIBC 281 ug/dL  Ferritin     Status: None   Collection Time: 10/11/16  3:37 PM  Result Value Ref Range   Ferritin 70 11 - 307 ng/mL  Reticulocytes     Status: Abnormal   Collection Time: 10/11/16  3:37 PM  Result Value Ref Range   Retic Ct Pct 1.5 0.4 - 3.1 %   RBC. 3.77 (L) 3.87 - 5.11 MIL/uL   Retic Count, Manual 56.6 19.0 - 186.0 K/uL  RPR     Status: None   Collection Time: 10/11/16  3:37 PM  Result Value Ref Range   RPR Ser Ql Non Reactive Non Reactive  Procalcitonin - Baseline     Status: None   Collection Time: 10/11/16  3:37 PM  Result Value Ref Range   Procalcitonin 0.23 ng/mL  Uric acid     Status: None   Collection Time: 10/11/16  3:37 PM  Result Value Ref Range   Uric Acid, Serum 4.9 2.3 - 6.6 mg/dL  Lactic acid, plasma     Status: None   Collection Time: 10/11/16  3:37 PM  Result Value Ref Range   Lactic Acid, Venous 1.0 0.5 - 1.9 mmol/L  MRSA PCR Screening     Status: None   Collection Time: 10/11/16  8:22 PM  Result Value Ref Range   MRSA by PCR NEGATIVE NEGATIVE  Heparin level (unfractionated)     Status: Abnormal   Collection Time: 10/11/16 10:29 PM  Result Value Ref Range   Heparin Unfractionated 0.19 (L) 0.30 - 0.70 IU/mL  CBC with Differential/Platelet     Status: Abnormal   Collection Time: 10/12/16  5:47 AM  Result Value  Ref Range   WBC 8.0 4.0 - 10.5 K/uL   RBC 3.69 (L) 3.87 - 5.11 MIL/uL   Hemoglobin 11.2 (L) 12.0 - 15.0 g/dL   HCT 34.6 (L) 36.0 - 46.0 %   MCV 93.8 78.0 - 100.0 fL   MCH 30.4 26.0 - 34.0 pg   MCHC 32.4 30.0 - 36.0 g/dL   RDW 13.2 11.5 - 15.5 %   Platelets 227 150 - 400 K/uL   Neutrophils Relative % 72 %   Lymphocytes Relative 24 %   Monocytes Relative 3 %   Eosinophils Relative 1 %   Basophils Relative 0 %   Neutro Abs 5.8 1.7 - 7.7 K/uL   Lymphs Abs 1.9 0.7 - 4.0 K/uL   Monocytes Absolute 0.2 0.1 - 1.0 K/uL   Eosinophils Absolute 0.1 0.0 - 0.7 K/uL   Basophils Absolute 0.0 0.0 - 0.1 K/uL   Smear Review MORPHOLOGY UNREMARKABLE   Comprehensive metabolic panel     Status: Abnormal   Collection Time: 10/12/16  5:47 AM  Result Value Ref Range   Sodium 143 135 - 145 mmol/L   Potassium 3.1 (L) 3.5 - 5.1 mmol/L   Chloride 108 101 - 111 mmol/L   CO2 26 22 - 32 mmol/L   Glucose, Bld 102 (H) 65 - 99 mg/dL   BUN 7 6 - 20 mg/dL   Creatinine, Ser 0.71 0.44 - 1.00 mg/dL   Calcium 9.8 8.9 - 10.3 mg/dL   Total Protein 7.1 6.5 - 8.1 g/dL  Albumin 3.2 (L) 3.5 - 5.0 g/dL   AST 16 15 - 41 U/L   ALT 14 14 - 54 U/L   Alkaline Phosphatase 100 38 - 126 U/L   Total Bilirubin 0.6 0.3 - 1.2 mg/dL   GFR calc non Af Amer >60 >60 mL/min   GFR calc Af Amer >60 >60 mL/min   Anion gap 9 5 - 15  Heparin level (unfractionated)     Status: Abnormal   Collection Time: 10/12/16  5:47 AM  Result Value Ref Range   Heparin Unfractionated 0.28 (L) 0.30 - 0.70 IU/mL  Lipid panel     Status: Abnormal   Collection Time: 10/12/16  5:47 AM  Result Value Ref Range   Cholesterol 190 0 - 200 mg/dL   Triglycerides 97 <150 mg/dL   HDL 42 >40 mg/dL   Total CHOL/HDL Ratio 4.5 RATIO   VLDL 19 0 - 40 mg/dL   LDL Cholesterol 129 (H) 0 - 99 mg/dL  HIV antibody     Status: None   Collection Time: 10/12/16  7:36 AM  Result Value Ref Range   HIV Screen 4th Generation wRfx Non Reactive Non Reactive  Heparin level  (unfractionated)     Status: None   Collection Time: 10/12/16 12:19 PM  Result Value Ref Range   Heparin Unfractionated 0.34 0.30 - 0.70 IU/mL   Ct Angio Head W Or Wo Contrast  Result Date: 10/11/2016 CLINICAL DATA:  Fluctuating slurred speech and left facial numbness and drooping. Generalized weakness. EXAM: CT ANGIOGRAPHY HEAD AND NECK TECHNIQUE: Multidetector CT imaging of the head and neck was performed using the standard protocol during bolus administration of intravenous contrast. Multiplanar CT image reconstructions and MIPs were obtained to evaluate the vascular anatomy. Carotid stenosis measurements (when applicable) are obtained utilizing NASCET criteria, using the distal internal carotid diameter as the denominator. CONTRAST:  50 cc Isovue 370 intravenous COMPARISON:  Head CT from earlier today FINDINGS: CTA NECK FINDINGS Aortic arch: Mild atheromatous changes. Aberrant right subclavian artery. Common common carotid origin. Right carotid system: Overall mild volume of calcified atherosclerotic plaque mainly centered on the carotid bifurcation. No flow limiting stenosis, ulceration, or dissection. Left carotid system: Mild mainly calcified plaque at the carotid bifurcation without stenosis, ulceration, or dissection. Vertebral arteries: Mild borderline moderate narrowing at the left subclavian ostium, likely not affecting vertebral flow. Intermittent tortuosity of the vertebral arteries without flow limiting stenosis or signs of dissection. Prominent noncalcified atheromatous changes on the right subclavian artery beyond the thyrocervical trunk. Skeleton: No acute or aggressive finding. Other neck: No incidental mass or adenopathy. Upper chest: Innumerable irregularly-shaped nodules in the apical lungs. Few emphysematous spaces. Review of the MIP images confirms the above findings CTA HEAD FINDINGS Anterior circulation: Carotid siphon atherosclerosis with moderate left cavernous segment stenosis.  Smaller left ICA in the setting of aplastic left A1 segment. No major branch occlusion or flow limiting stenosis to explain symptoms. Posterior circulation: Symmetric vertebral arteries. There is severe focal mid basilar stenosis with irregular luminal appearance compatible with thrombus or clot. Downstream major branches are patent. Venous sinuses: Patent Anatomic variants: Aplastic left A1 segment Delayed phase: No parenchymal enhancement or mass P Critical Value/emergent results were called by telephone at the time of interpretation on 10/11/2016 at 3:28 pm to Dr. Rosalin Hawking , who verbally acknowledged these results. Review of the MIP images confirms the above findings IMPRESSION: 1. 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 No visualized acute infarct. 2. Cervical carotid  atherosclerosis without flow limiting stenosis. Moderate left cavernous ICA atheromatous narrowing. 3. Mild borderline moderate left subclavian origin stenosis. 4. Micronodules in the bilateral lungs, suspect respiratory bronchiolitis in this smoker. Emphysema is present. Full noncontrast chest CT recommended after convalescence. Electronically Signed   By: Monte Fantasia M.D.   On: 10/11/2016 15:35   Ct Head Wo Contrast  Result Date: 10/11/2016 CLINICAL DATA:  Left-sided weakness, facial droop, slurred speech. EXAM: CT HEAD WITHOUT CONTRAST TECHNIQUE: Contiguous axial images were obtained from the base of the skull through the vertex without intravenous contrast. COMPARISON:  CT scan of October 10, 2016. FINDINGS: Brain: No evidence of acute infarction, hemorrhage, hydrocephalus, extra-axial collection or mass lesion/mass effect. Vascular: No hyperdense vessel or unexpected calcification. Skull: Normal. Negative for fracture or focal lesion. Sinuses/Orbits: No acute finding. Other: None. IMPRESSION: Normal head CT. Electronically Signed   By: Marijo Conception, M.D.   On: 10/11/2016 10:41   Ct Angio Neck W Or Wo  Contrast  Result Date: 10/11/2016 CLINICAL DATA:  Fluctuating slurred speech and left facial numbness and drooping. Generalized weakness. EXAM: CT ANGIOGRAPHY HEAD AND NECK TECHNIQUE: Multidetector CT imaging of the head and neck was performed using the standard protocol during bolus administration of intravenous contrast. Multiplanar CT image reconstructions and MIPs were obtained to evaluate the vascular anatomy. Carotid stenosis measurements (when applicable) are obtained utilizing NASCET criteria, using the distal internal carotid diameter as the denominator. CONTRAST:  50 cc Isovue 370 intravenous COMPARISON:  Head CT from earlier today FINDINGS: CTA NECK FINDINGS Aortic arch: Mild atheromatous changes. Aberrant right subclavian artery. Common common carotid origin. Right carotid system: Overall mild volume of calcified atherosclerotic plaque mainly centered on the carotid bifurcation. No flow limiting stenosis, ulceration, or dissection. Left carotid system: Mild mainly calcified plaque at the carotid bifurcation without stenosis, ulceration, or dissection. Vertebral arteries: Mild borderline moderate narrowing at the left subclavian ostium, likely not affecting vertebral flow. Intermittent tortuosity of the vertebral arteries without flow limiting stenosis or signs of dissection. Prominent noncalcified atheromatous changes on the right subclavian artery beyond the thyrocervical trunk. Skeleton: No acute or aggressive finding. Other neck: No incidental mass or adenopathy. Upper chest: Innumerable irregularly-shaped nodules in the apical lungs. Few emphysematous spaces. Review of the MIP images confirms the above findings CTA HEAD FINDINGS Anterior circulation: Carotid siphon atherosclerosis with moderate left cavernous segment stenosis. Smaller left ICA in the setting of aplastic left A1 segment. No major branch occlusion or flow limiting stenosis to explain symptoms. Posterior circulation: Symmetric  vertebral arteries. There is severe focal mid basilar stenosis with irregular luminal appearance compatible with thrombus or clot. Downstream major branches are patent. Venous sinuses: Patent Anatomic variants: Aplastic left A1 segment Delayed phase: No parenchymal enhancement or mass P Critical Value/emergent results were called by telephone at the time of interpretation on 10/11/2016 at 3:28 pm to Dr. Rosalin Hawking , who verbally acknowledged these results. Review of the MIP images confirms the above findings IMPRESSION: 1. 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 No visualized acute infarct. 2. Cervical carotid atherosclerosis without flow limiting stenosis. Moderate left cavernous ICA atheromatous narrowing. 3. Mild borderline moderate left subclavian origin stenosis. 4. Micronodules in the bilateral lungs, suspect respiratory bronchiolitis in this smoker. Emphysema is present. Full noncontrast chest CT recommended after convalescence. Electronically Signed   By: Monte Fantasia M.D.   On: 10/11/2016 15:35   Mr Brain Wo Contrast  Result Date: 10/11/2016 CLINICAL DATA:  71 y/o  F;  stroke. EXAM: MRI HEAD WITHOUT CONTRAST TECHNIQUE: Multiplanar, multiecho pulse sequences of the brain and surrounding structures were obtained without intravenous contrast. COMPARISON:  10/11/2016 CT head and CT angiogram head and neck FINDINGS: Brain: Diffusion restriction within right paramedian pons and additional small foci in the right cerebellar hemisphere, right brachium pontis, left pontomedullary junction, right occipital lobe, and right paramedian parietal lobe compatible with acute infarction. There is mild associated T2 FLAIR signal abnormality with the areas of infarction. Otherwise there is no significant T2 FLAIR signal abnormality of the brain. No abnormal susceptibility hypointensity to suggest intracranial hemorrhage is identified. No extra-axial collection, focal mass effect, or  hydrocephalus. Vascular: Better assessed on prior CT angiogram. Skull and upper cervical spine: Normal marrow signal. Sinuses/Orbits: Negative. Other: None. IMPRESSION: Acute/early subacute infarction within the right hemi pons and multiple additional small foci in the right cerebellar hemisphere, right brachium pontis, left pontomedullary junction, right occipital lobe, and right paramedian parietal lobe. No hemorrhage identified. Electronically Signed   By: Kristine Garbe M.D.   On: 10/11/2016 17:17   Dg Chest Port 1 View  Result Date: 10/11/2016 CLINICAL DATA:  72 year old female with weakness and confusion. Subsequent encounter. EXAM: PORTABLE CHEST 1 VIEW COMPARISON:  10/10/2016 and 01/31/2015 chest x-ray. FINDINGS: No infiltrate, congestive heart failure or pneumothorax. No plain film evidence of pulmonary malignancy. Heart size top-normal. Tortuous aorta which is partially calcified. No acute osseous abnormality. Acromioclavicular joint degenerative changes. IMPRESSION: No active disease. Electronically Signed   By: Genia Del M.D.   On: 10/11/2016 11:44    Assessment/Plan: Diagnosis: bibasilar infarct 1. Does the need for close, 24 hr/day medical supervision in concert with the patient's rehab needs make it unreasonable for this patient to be served in a less intensive setting? Potentially 2. Co-Morbidities requiring supervision/potential complications: HTN, gout, post-stroke sequelae 3. Due to bladder management, bowel management, safety, skin/wound care, disease management, medication administration, pain management and patient education, does the patient require 24 hr/day rehab nursing? Yes 4. Does the patient require coordinated care of a physician, rehab nurse, PT (1-2 hrs/day, 5 days/week), OT (1-2 hrs/day, 5 days/week) and SLP (1-2 hrs/day, 5 days/week) to address physical and functional deficits in the context of the above medical diagnosis(es)? Yes and  Potentially Addressing deficits in the following areas: balance, endurance, locomotion, strength, transferring, bowel/bladder control, bathing, dressing, feeding, grooming, speech, swallowing and psychosocial support 5. Can the patient actively participate in an intensive therapy program of at least 3 hrs of therapy per day at least 5 days per week? Yes 6. The potential for patient to make measurable gains while on inpatient rehab is excellent 7. Anticipated functional outcomes upon discharge from inpatient rehab are modified independent and supervision  with PT, modified independent and supervision with OT, modified independent with SLP. 8. Estimated rehab length of stay to reach the above functional goals is: potentially 7 days 9. Does the patient have adequate social supports and living environment to accommodate these discharge functional goals? Yes 10. Anticipated D/C setting: Home 11. Anticipated post D/C treatments: HH therapy and Outpatient therapy 12. Overall Rehab/Functional Prognosis: excellent  RECOMMENDATIONS:  This patient's condition is appropriate for continued rehabilitative care in the following setting: CIR Patient has agreed to participate in recommended program. Yes Note that insurance prior authorization may be required for reimbursement for recommended care.  Comment: Likely will need inpatient rehab, but I would like to see her progress with PT/OT over the next 24 hours.  Rehab Admissions Coordinator to follow up.  Thanks,  Meredith Staggers, MD, Mellody Drown    Cathlyn Parsons., PA-C 10/12/2016

## 2016-10-12 NOTE — Progress Notes (Signed)
*  PRELIMINARY RESULTS* Vascular Ultrasound Bilateral lower extremity venous duplex has been completed.  Preliminary findings: No evidence of deep vein thrombosis in the visualized veins or baker's cysts bilaterally   Everrett Coombe 10/12/2016, 2:52 PM

## 2016-10-12 NOTE — Progress Notes (Signed)
STROKE TEAM PROGRESS NOTE   HISTORY OF PRESENT ILLNESS (per record) Alonna Vandivier Boykinis a 71 y.o.femalewith PMH of HTN, smoker, anxiety, gout, and unintentional weight loss since September 2017. She presented to ER yesterday with 5-6 episodes of left facial drooping and numbness,left sided weakness and slurred speech started two days ago. In ER her was asymptomatic. CT of the head negative but revealed possibility of sinusitis so patient was discharged on Augmentin. She has a lot of stress and family issues at home so it was also considered may be related to stress. She was discharged home without further workup. This morning her son noticed recurrence of similar symptoms of left facial droop, slurred speech and left side weakness. EMS was called and on arrival her symptoms had resolved. She had vomited prior to their arrival. EMS stroke scale was negative. At our ER she was not responding to the nurse and she was vomiting clear vomitus although she was nodding appropriately. CT of the head without contrast was repeated and once again showed no acute changes. She appeared dehydrated and had a leukocytosis of 19,000. However, later in ED, she had again left facial droop, slurry speech, left sided hemiplegia. Neurology was called for evaluation. 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 On further questioning, pt and daughter stated that her symptoms never really resolved for the last two days, fluctuating better and worse, and currently worsened again. Heparin IV started and she was admitted to ICU for fluctuating BA thrombosis. She was last known well 10/10/2016 at 10:35 AM. Patient was not administered IV t-PA secondary to presenting outside of the TPA window. She was admitted to the neuro ICU for further evaluation and treatment.   SUBJECTIVE (INTERVAL HISTORY) Son is at bedside. Pt left sided weakness much improved. On heparin drip. Passed swallow. Will do CT chest and abd and pelvis to rule out  malignancy.    OBJECTIVE Temp:  [97.3 F (36.3 C)-98.4 F (36.9 C)] 97.3 F (36.3 C) (01/30 0800) Pulse Rate:  [66-91] 84 (01/30 1000) Cardiac Rhythm: Normal sinus rhythm (01/30 0800) Resp:  [12-22] 15 (01/30 1000) BP: (99-146)/(62-100) 99/87 (01/30 1000) SpO2:  [97 %-100 %] 99 % (01/30 1000) Weight:  [56 kg (123 lb 7.3 oz)] 56 kg (123 lb 7.3 oz) (01/29 2015)  CBC:  Recent Labs Lab 10/10/16 1035  10/11/16 0921 10/12/16 0547  WBC 9.5  --  19.1* 8.0  NEUTROABS 6.8  --   --  5.8  HGB 12.0  < > 11.7* 11.2*  HCT 36.5  < > 36.4 34.6*  MCV 93.4  --  94.1 93.8  PLT 234  --  293 227  < > = values in this interval not displayed.  Basic Metabolic Panel:  Recent Labs Lab 10/11/16 0921 10/11/16 1117 10/12/16 0547  NA 141  --  143  K 2.6*  --  3.1*  CL 106  --  108  CO2 23  --  26  GLUCOSE 167*  --  102*  BUN 13  --  7  CREATININE 0.84  --  0.71  CALCIUM 10.1  --  9.8  MG  --  2.2  --     Lipid Panel: No results found for: CHOL, TRIG, HDL, CHOLHDL, VLDL, LDLCALC HgbA1c: No results found for: HGBA1C Urine Drug Screen:    Component Value Date/Time   LABOPIA POSITIVE (A) 10/11/2016 1125   COCAINSCRNUR NONE DETECTED 10/11/2016 1125   LABBENZ NONE DETECTED 10/11/2016 1125   AMPHETMU NONE  DETECTED 10/11/2016 1125   THCU NONE DETECTED 10/11/2016 1125   LABBARB NONE DETECTED 10/11/2016 1125      IMAGING I have personally reviewed the radiological images below and agree with the radiology interpretations.  Dg Chest 2 View 10/11/2016 No active disease.  10/10/2016 No active cardiopulmonary disease.   Ct Head Wo Contrast 10/11/2016 Normal head CT.   Ct Angio Head W Or Wo Contrast Ct Angio Neck W Or Wo Contrast 10/11/2016 1. 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 No visualized acute infarct. 2. Cervical carotid atherosclerosis without flow limiting stenosis. Moderate left cavernous ICA atheromatous narrowing. 3. Mild borderline moderate left  subclavian origin stenosis. 4. Micronodules in the bilateral lungs, suspect respiratory bronchiolitis in this smoker. Emphysema is present. Full noncontrast chest CT recommended after convalescence.   Mr Brain Wo Contrast 10/11/2016 Acute/early subacute infarction within the right hemi pons and multiple additional small foci in the right cerebellar hemisphere, right brachium pontis, left pontomedullary junction, right occipital lobe, and right paramedian parietal lobe. No hemorrhage identified.    PHYSICAL EXAM  General - The patient's general appearance was well nourished, well developed, in no apparent distress.    Ophthalmologic - fundi not visualized due to noncooperation.    Cardiovascular - Ausculation of the heart revealed regular rate  Mental Status -  Level of arousal and orientation to time, place, and person were intact. Language including expression, naming, repetition, comprehension was found intact, however, moderate dysarthria. Fund of Knowledge was assessed and was intact.  Cranial Nerves II - XII - II - Vision intact OU. III, IV, VI - Extraocular movements intact. V - Facial sensation intact bilaterally. VII - mild left facial droop. VIII - Hearing & vestibular intact bilaterally. X - Palate elevates symmetrically, moderate dysarthria. XI - Chin turning & shoulder shrug intact bilaterally. XII - Tongue protrusion intact.  Motor Strength - The patient's strength was normal RUE and RLE, however, left UE 3+/5 and left LE 4-/5.   Motor Tone & Bulk - Muscle tone was assessed at the neck and appendages and mildly increased on the left.  Bulk was normal and fasciculations were absent.   Reflexes - The patient's reflexes were normal in all extremities and she had no pathological reflexes.  Sensory - Light touch, temperature/pinprick were assessed and were normal.    Coordination - The patient had normal movements in the right hand with no ataxia or dysmetria,  left FTN ataxic but not proportional to weakness.  Tremor was absent.  Gait and Station - not able to test.   ASSESSMENT/PLAN Ms. ZABRIA VICINI is a 71 y.o. female with history of HTN, smoker, anxiety, gout, and unintentional weight loss since September 2017 presenting with 2 day hx of 5-6 episodes of left facial drooping and numbness,left sided weakness and slurred speech. She did not receive IV t-PA due to delay in presentation. CT of the head no acute changes. As per patient and family, her symptoms never resolved back to normal, however fluctuating better or worse over the time. Initially ER patient symptoms improved, however later symptoms returned. 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 Heparin IV started and she was admitted to ICU for fluctuating BA thrombosis.   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 Micronodules in bilateral lungs  MRI  acute/subacute right hemi-pons and multiple small foci infarcts in the right cerebellar hemisphere, right brachium pontis, left pontomedullary junction, right occipital lobe, right paramedian parietal lobe infarcts  Carotid Dopplers unremarkable  2D Echo  pending  LEvenous Dopplers negative for DVT  LDL 129   HgbA1c pending  IV heparin for VTE prophylaxis  Diet regular Room service appropriate? Yes; Fluid consistency: Thin  No antithrombotic prior to admission, now on heparin IV. Once stabilized, transition to coumadin with lovenox bridging  Patient counseled to be compliant with her antithrombotic medications  Ongoing aggressive stroke risk factor management  Therapy recommendations:  Pending  Disposition:  Pending  Unintentional weight loss  CTA neck showed micronodules in both lungs  Heavy smoker for long time  BA thrombus needs to  rule out advanced malignancy  CT chest, abd/pelvis pending  Hypertension  Stable  Permissive hypertension (OK if < 180/105) but gradually normalize in 5-7 days  Long-term BP goal normotensive  Hyperlipidemia  Home meds:  No statin (had been on mevacor in the past, stopped 08/19/2013)  LDL 129, goal < 70  On lipitor 80mg   Continue statin on discharge  Tobacco abuse  Current smoker  Smoking cessation counseling provided  Nicotine patch provided  Pt is willing to quit  Other Stroke Risk Factors  Advanced age  Unhealthy life styles  Other Active Problems  Failure to thrive   Leukocytosis 19.1->8.0  Chronic pain/ gout  Acute hypokalemia - on supplement  Normocytic anemia  Pneumonia, on augmentin   Hospital day # 1  This patient is critically ill due to [facility] thrombosis with fluctuating symptoms and at significant risk of neurological worsening, death form basilar artery occlusion and brainstem infarct. This patient's care requires constant monitoring of vital signs, hemodynamics, respiratory and cardiac monitoring, review of multiple databases, neurological assessment, discussion with family, other specialists and medical decision making of high complexity. I spent 50 minutes of neurocritical care time in the care of this patient.  Rosalin Hawking, MD PhD Stroke Neurology 10/12/2016 4:36 PM   To contact Stroke Continuity provider, please refer to [email or web address] After hours, contact General Neurology

## 2016-10-12 NOTE — Progress Notes (Signed)
*  PRELIMINARY RESULTS* Vascular Ultrasound Carotid Duplex (Doppler) has been completed.  Preliminary findings: Bilateral 1-39% ICA stenosis, antegrade vertebral flow.   Everrett Coombe 10/12/2016, 2:52 PM

## 2016-10-12 NOTE — Evaluation (Signed)
Speech Language Pathology Evaluation Patient Details Name: AURIA DEMBEK MRN: [id number] DOB: March 04, 1946 Today's Date: 10/12/2016 Time: [id number] SLP Time Calculation (min) (ACUTE ONLY): 8 min  Problem List:  Patient Active Problem List   Diagnosis Date Noted  . Stroke-like symptoms 10/11/2016  . FTT (failure to thrive) in adult 10/11/2016  . Leukocytosis 10/11/2016  . Acute hypokalemia 10/11/2016  . Normocytic anemia 10/11/2016  . Cerebral thrombosis with cerebral infarction 10/11/2016  . Stroke ([place]) 10/11/2016  . 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 Ischemic stroke ([place])   . Anxiety   . Hypokalemia   . Pelvic mass in female 12/26/2014  . Gout 12/24/2008  . ALLERGIC RHINITIS 12/20/2008  . KNEE PAIN, LEFT 12/20/2008  . TOBACCO ABUSE 07/29/2006  . HTN (hypertension) 07/29/2006  . HEMORRHOIDS 07/29/2006   Past Medical History:  Past Medical History:  Diagnosis Date  . Anemia   . Anxiety   . Arthritis    knees   . Dysrhythmia    heart skips a beat   . GERD (gastroesophageal reflux disease)   . Gout   . Hyperlipidemia April 2014  . Hypertension   . Shortness of breath dyspnea    due to pressure of cyst per patient    Past Surgical History:  Past Surgical History:  Procedure Laterality Date  . CESAREAN SECTION     x3  . OVARIAN CYST SURGERY Right 1980's  . SALPINGOOPHORECTOMY Bilateral 02/06/2015   Procedure: [facility] Stall LAPAROTOMY/BILATERAL SALPINGO OOPHORECTOMY;  Surgeon: Everitt Amber, MD;  Location: [facility];  Service: Gynecology;  Laterality: Bilateral;   HPI:  71 y.o.femalewith PMH of HTN, smoker, anxiety, gout, and unintentional weight loss since September 2017. Pt initially presented to the ED 1/28 with intermittent episodes of left-sided weakness and slurred speech, but she was asymptomatic and CT Head was negative so she was d/c home. EMS was called on 1/29 due to recurrence of similar symptoms. MRI showed acute/early subacute infarction  within the right hemi pons and multiple additional small foci in the right cerebellar hemisphere, right brachium pontis, left pontomedullary junction, right occipital lobe, and right paramedian parietal lobe.    Assessment / Plan / Recommendation Clinical Impression  Pt has a mild-moderate dysarthria with imprecise and discoordinated articulation, with intelligibility further impacted by pt's fast rate of speech. Cognitively she does well with more basic structured and functional tasks, but she would benefit from additional SLP f/u focused on differential diagnosis of higher level skills. SLP will continue to follow.    SLP Assessment  Patient needs continued Speech Lanaguage Pathology Services    Follow Up Recommendations   (tba)    Frequency and Duration min 2x/week  2 weeks      SLP Evaluation Cognition  Overall Cognitive Status: Within Functional Limits for tasks assessed Orientation Level: Oriented X4       Comprehension  Auditory Comprehension Overall Auditory Comprehension: Appears within functional limits for tasks assessed    Expression Expression Primary Mode of Expression: Verbal Verbal Expression Overall Verbal Expression: Appears within functional limits for tasks assessed   Oral / Motor  Oral Motor/Sensory Function Overall Oral Motor/Sensory Function: Mild impairment Facial ROM: Reduced left;Suspected CN VII (facial) dysfunction Facial Symmetry: Abnormal symmetry left;Suspected CN VII (facial) dysfunction Facial Strength: Reduced left;Suspected CN VII (facial) dysfunction Motor Speech Overall Motor Speech: Impaired Respiration: Within functional limits Phonation: Normal Articulation: Impaired Level of Impairment: Conversation Intelligibility: Intelligibility reduced Conversation: 50-74% accurate Motor Planning: Not tested Motor Speech Errors:  Not applicable Effective Techniques: Slow rate   GO                    [name] 10/12/2016, 10:29  AM  [name], M.A. CCC-SLP [phone]

## 2016-10-12 NOTE — Progress Notes (Addendum)
Initial Nutrition Assessment  DOCUMENTATION CODES:   Severe malnutrition in context of chronic illness  INTERVENTION:   Ensure Enlive po BID, each supplement provides 350 kcal and 20 grams of protein  Encouraged protein at each meal and increased calories for weight gain while still eating a Heart Healthy diet. Reviewed menu options with patient and son. Will provide additional education as needed.   NUTRITION DIAGNOSIS:   Malnutrition (Severe) related to chronic illness as evidenced by moderate depletions of muscle mass, moderate depletion of body fat, energy intake < or equal to 75% for > or equal to 1 month, 18 percent weight loss in the last 4 months.  GOAL:   Patient will meet greater than or equal to 90% of their needs  MONITOR:   PO intake, Supplement acceptance, I & O's, Weight trends  REASON FOR ASSESSMENT:   Consult  Assessment of nutrition requirement/status  ASSESSMENT:   Pt with PMH of HTN, smoker, anxiety, gout, admitted with acute stroke. Pt with recent hx of unintentional weight loss and lung nodule on CT.    Pt and son provides history. Pt reports usual weight is in the 150's. She has lost 18% of her body weight since 9/17 (4 months). She reports no appetite. Does not eat breakfast, lunch is a sandwich a couple of times a week. She only eats a regular meal for dinner a couple of times a month. Otherwise she eats some snacks, sometimes fast food. Her son now lives with her. She did make a comment that there was not anything to eat in the fridge but son denies this. She reports being weak and just not having an appetite. Pt states that stress is an issue but does not specify.  She reports drinking mascato wine but only occassionally.  Noted lung nodule seen on CT.  Nutrition-Focused physical exam completed. Findings are mild/moderate fat depletion, severe muscle depletion, and no edema.   Medications reviewed and include: folic acid, thiamine, MVI K+ 3.1  Diet  Order:  Diet Heart Room service appropriate? Yes; Fluid consistency: Thin  Skin:  Reviewed, no issues  Last BM:  unknown  Height:   Ht Readings from Last 1 Encounters:  10/11/16 5\' 5"  (1.651 m)    Weight:   Wt Readings from Last 1 Encounters:  10/11/16 123 lb 7.3 oz (56 kg)    Ideal Body Weight:  56.8 kg  BMI:  Body mass index is 20.54 kg/m.  Estimated Nutritional Needs:   Kcal:  1600-1800  Protein:  80-100 grams  Fluid:  > 1.6 L/day  EDUCATION NEEDS:   Education needs addressed  Maylon Peppers RD, [place], [place] Pager [phone] After Hours Pager

## 2016-10-12 NOTE — Progress Notes (Signed)
ANTICOAGULATION CONSULT NOTE - Follow-up Consult  Pharmacy Consult for Heparin Indication: basilar artery thrombosis  Allergies  Allergen Reactions  . Tramadol     Per pt, she got depressed, moody, and had increased back pain when she took tramadol    Patient Measurements: Height: 5\' 5"  (165.1 cm) Weight: 123 lb 7.3 oz (56 kg) IBW/kg (Calculated) : 57  Vital Signs: Temp: 98.2 F (36.8 C) (01/30 1000) Temp Source: Oral (01/30 1000) BP: 125/69 (01/30 1300) Pulse Rate: 66 (01/30 1300)  Labs:  Recent Labs  10/10/16 1035 10/10/16 1042 10/11/16 0921 10/11/16 2229 10/12/16 0547 10/12/16 1219  HGB 12.0 12.6 11.7*  --  11.2*  --   HCT 36.5 37.0 36.4  --  34.6*  --   PLT 234  --  293  --  227  --   APTT 28  --   --   --   --   --   LABPROT 13.1  --   --   --   --   --   INR 0.99  --   --   --   --   --   HEPARINUNFRC  --   --   --  0.19* 0.28* 0.34  CREATININE 0.76 0.70 0.84  --  0.71  --     Estimated Creatinine Clearance (by C-G formula based on SCr of 0.71 mg/dL) Female: 59.5 mL/min Female: 70 mL/min   Assessment: 70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   Repeat HL is now therapeutic at 0.34 on heparin 850 units/hr. No issues with infusion or bleeding noted.  Goal of Therapy:  Heparin level 0.3-0.5 units/ml Monitor platelets by anticoagulation protocol: Yes   Plan:  Continue heparin 850 units/hr 6h confirmatory HL Monitor s/sx of bleeding  Andrey Cota. Diona Foley, PharmD, BCPS Clinical Pharmacist 10/12/2016,1:30 PM

## 2016-10-12 NOTE — Evaluation (Signed)
Physical Therapy Evaluation Patient Details Name: Mercedes Gardner MRN: [id number] DOB: Sep 02, 1946 Today's Date: 10/12/2016   History of Present Illness  Patient is a 71 y/o female with hx of HTN, HLD, gout, anxiety, unintentional weight loss since September 2017 presents initially with transient left facial droop, numbness and slurred speech which resolved in EDm CT Head was negative so she was d/c home. EMS was called on 1/29 due to recurrence of similar symptoms. 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 Tolerated gait training with Mod A for balance/safety due to impulsivity; "let me try by myself." Pt with LOB when standing from bathroom without assist despite cues to call for assist. Eager to get OOB and highly motivated to return to PLOF. Encouraged use of LUE for all functional tasks. Would benefit from CIR to maximize independence and mobility prior to return home. Will follow acutely.    Follow Up Recommendations CIR    Equipment Recommendations  Other (comment) (TBA)    Recommendations for Other Services       Precautions / Restrictions Precautions Precautions: Fall Restrictions Weight Bearing Restrictions: No      Mobility  Bed Mobility Overal bed mobility: Needs Assistance Bed Mobility: Supine to Sit;Sit to Supine     Supine to sit: HOB elevated;Min assist Sit to supine: Min assist   General bed mobility comments: Assist to bring LLE out of bed and into bed. Increased time.  Transfers Overall transfer level: Needs assistance Equipment used: 1 person hand held assist Transfers:  Sit to/from Stand Sit to Stand: Min assist         General transfer comment: Assist to power to standing and for balance due to impulsivity. Stood from [employer], from toilet x1. Stood without assist from toilet despite cues to call for help and LOB into wall.  Ambulation/Gait Ambulation/Gait assistance: Mod assist;+2 safety/equipment Ambulation Distance (Feet): 12 Feet (x2 bouts) Assistive device: 1 person hand held assist Gait Pattern/deviations: Scissoring;Ataxic;Step-through pattern;Decreased step length - right;Decreased step length - left     General Gait Details: Ataxic like gait pattern with incoordinated movement LLE; pt impulsive and fast with gait speed. Poor safety awareness. 1 LOB when standing from toilet without assist.   Stairs            Wheelchair Mobility    Modified Rankin (Stroke Patients Only) Modified Rankin (Stroke Patients Only) Pre-Morbid Rankin Score: No symptoms Modified Rankin: Moderately severe disability     Balance Overall balance assessment: Needs assistance Sitting-balance support: Feet supported;No upper extremity supported Sitting balance-Leahy Scale: Fair Sitting balance - Comments: Able to reach outside BoS and doff sock with min guard assist for safety due to nausea/unsteadiness. Assist to donn sock.   Standing balance support: During functional activity;Single extremity supported Standing balance-Leahy Scale: Poor Standing balance comment: Reliant on external support for standing balance.                              Pertinent Vitals/Pain Pain Assessment: No/denies pain    Home Living Family/patient expects to be discharged to:: Inpatient rehab Living Arrangements: Children (with son) Available Help at Discharge: Family Type of Home: House Home Access:  Stairs to enter   [employer] of Steps: 3 Home Layout: One level Home Equipment: Crutches      Prior Function Level of Independence: Independent          Comments: Works as Print production planner.     Hand Dominance   Dominant Hand: Right    Extremity/Trunk Assessment   Upper Extremity Assessment Upper Extremity Assessment: Defer to OT evaluation;LUE deficits/detail LUE Deficits / Details: Incoordination LUE as demonstrated during finger to nose. LUE Sensation: decreased light touch LUE Coordination: decreased fine motor    Lower Extremity Assessment Lower Extremity Assessment: LLE deficits/detail LLE Deficits / Details: Grossly ~3-4/5 throughout, some incoordination. LLE Sensation: decreased light touch LLE Coordination: decreased fine motor;decreased gross motor       Communication   Communication:  (dysarthria)  Cognition Arousal/Alertness: Awake/alert Behavior During Therapy: Impulsive Overall Cognitive Status: Within Functional Limits for tasks assessed                      General Comments General comments (skin integrity, edema, etc.): Son present during session.    Exercises     Assessment/Plan    PT Assessment Patient needs continued PT services  PT Problem List Decreased strength;Decreased mobility;Decreased safety awareness;Decreased coordination;Decreased balance;Impaired sensation;Decreased knowledge of use of DME;Decreased knowledge of precautions          PT Treatment Interventions DME instruction;Therapeutic activities;Gait training;Therapeutic exercise;Patient/family education;Balance training;Stair training;Functional mobility training;Neuromuscular re-education    PT Goals (Current goals can be found in the Care Plan section)  Acute Rehab PT Goals Patient Stated Goal: to get back to normal PT Goal Formulation: With patient Time For Goal Achievement: 10/26/16 Potential to Achieve Goals: Good    Frequency Min 4X/week   Barriers to discharge        Co-evaluation               End of Session Equipment Utilized During Treatment: Gait belt Activity Tolerance: Patient  tolerated treatment well Patient left: in bed;with call bell/phone within reach;Other (comment) (Ultrasound tech in room) Nurse Communication: Mobility status         Time: [id number] PT Time Calculation (min) (ACUTE ONLY): 23 min   Charges:   PT Evaluation $PT Eval Moderate Complexity: 1 Procedure PT Treatments $Gait Training: 8-22 mins   PT G Codes:        Ashur Glatfelter A Karen Huhta 10/12/2016, 3:00 PM Wray Kear[place], [place], DPT [phone]

## 2016-10-12 NOTE — Progress Notes (Signed)
ANTICOAGULATION CONSULT NOTE - Follow-up Consult  Pharmacy Consult for Heparin Indication: basilar artery thrombosis  Allergies  Allergen Reactions  . Tramadol     Per pt, she got depressed, moody, and had increased back pain when she took tramadol    Patient Measurements: Height: 5\' 5"  (165.1 cm) Weight: 123 lb 7.3 oz (56 kg) IBW/kg (Calculated) : 57  Vital Signs: Temp: 98.1 F (36.7 C) (01/30 0400) Temp Source: Oral (01/30 0400) BP: 123/68 (01/30 0600) Pulse Rate: 66 (01/30 0600)  Labs:  Recent Labs  10/10/16 1035 10/10/16 1042 10/11/16 0921 10/11/16 2229 10/12/16 0547  HGB 12.0 12.6 11.7*  --   --   HCT 36.5 37.0 36.4  --   --   PLT 234  --  293  --   --   APTT 28  --   --   --   --   LABPROT 13.1  --   --   --   --   INR 0.99  --   --   --   --   HEPARINUNFRC  --   --   --  0.19* 0.28*  CREATININE 0.76 0.70 0.84  --   --     Estimated Creatinine Clearance (by C-G formula based on SCr of 0.84 mg/dL) Female: 56.7 mL/min Female: 66.7 mL/min   Assessment: 70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 Heparin level remains slightly subtherapeutic (0.28) on gtt at 750 units/hr. No issues with line or bleeding reported per RN.  Goal of Therapy:  Heparin level 0.3-0.5 units/ml Monitor platelets by anticoagulation protocol: Yes   Plan:  Increase heparin to 850 units/hr F/u 6 hr heparin level  Sherlon Handing, PharmD, BCPS Clinical pharmacist, pager [phone] 10/12/2016,6:49 AM

## 2016-10-12 NOTE — Progress Notes (Signed)
Rehab admissions - I am following for potential acute inpatient rehab admission.  I will see patient in the am.  Call me for questions.  [phone]
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
## 2016-10-13 ENCOUNTER — Inpatient Hospital Stay (HOSPITAL_COMMUNITY): Payer: Medicare Other

## 2016-10-13 DIAGNOSIS — R2990 Unspecified symptoms and signs involving the nervous system: Secondary | ICD-10-CM

## 2016-10-13 DIAGNOSIS — R0600 Dyspnea, unspecified: Secondary | ICD-10-CM

## 2016-10-13 DIAGNOSIS — R531 Weakness: Secondary | ICD-10-CM

## 2016-10-13 DIAGNOSIS — R634 Abnormal weight loss: Secondary | ICD-10-CM

## 2016-10-13 DIAGNOSIS — R911 Solitary pulmonary nodule: Secondary | ICD-10-CM

## 2016-10-13 LAB — PROCALCITONIN: Procalcitonin: 0.21 ng/mL

## 2016-10-13 LAB — BASIC METABOLIC PANEL
ANION GAP: 7 (ref 5–15)
BUN: 13 mg/dL (ref 6–20)
CO2: 23 mmol/L (ref 22–32)
Calcium: 9.9 mg/dL (ref 8.9–10.3)
Chloride: 109 mmol/L (ref 101–111)
Creatinine, Ser: 0.83 mg/dL (ref 0.44–1.00)
GFR calc Af Amer: >60 mL/min (ref >60)
GFR calc non Af Amer: >60 mL/min (ref >60)
GLUCOSE: 107 mg/dL — AB (ref 65–99)
Potassium: 4.1 mmol/L (ref 3.5–5.1)
Sodium: 139 mmol/L (ref 135–145)

## 2016-10-13 LAB — CBC
HCT: 33.5 % — ABNORMAL LOW (ref 36.0–46.0)
Hemoglobin: 10.7 g/dL — ABNORMAL LOW (ref 12.0–15.0)
MCH: 30 pg (ref 26.0–34.0)
MCHC: 31.9 g/dL (ref 30.0–36.0)
MCV: 93.8 fL (ref 78.0–100.0)
PLATELETS: 219 10*3/uL (ref 150–400)
RBC: 3.57 MIL/uL — ABNORMAL LOW (ref 3.87–5.11)
RDW: 13.1 % (ref 11.5–15.5)
WBC: 6.6 10*3/uL (ref 4.0–10.5)

## 2016-10-13 LAB — ECHOCARDIOGRAM COMPLETE
Height: 65 in
Weight: 1975.32 oz

## 2016-10-13 LAB — HEMOGLOBIN A1C
Hgb A1c MFr Bld: 5.4 % (ref 4.8–5.6)
MEAN PLASMA GLUCOSE: 108 mg/dL

## 2016-10-13 LAB — HEPARIN LEVEL (UNFRACTIONATED): HEPARIN UNFRACTIONATED: 0.35 [IU]/mL (ref 0.30–0.70)

## 2016-10-13 MED ORDER — WARFARIN SODIUM 5 MG PO TABS
5.0000 mg | ORAL_TABLET | Freq: Once | ORAL | Status: AC
  Administered 2016-10-13: 5 mg via ORAL
  Filled 2016-10-13: qty 1

## 2016-10-13 MED ORDER — ENOXAPARIN SODIUM 60 MG/0.6ML ~~LOC~~ SOLN
1.0000 mg/kg | Freq: Once | SUBCUTANEOUS | Status: AC
  Administered 2016-10-13: 55 mg via SUBCUTANEOUS
  Filled 2016-10-13: qty 0.55
  Filled 2016-10-13: qty 0.6

## 2016-10-13 MED ORDER — WARFARIN VIDEO
Freq: Once | Status: AC
  Administered 2016-10-13: 1

## 2016-10-13 MED ORDER — ENOXAPARIN SODIUM 60 MG/0.6ML ~~LOC~~ SOLN
1.0000 mg/kg | Freq: Two times a day (BID) | SUBCUTANEOUS | Status: DC
  Administered 2016-10-14: 55 mg via SUBCUTANEOUS
  Filled 2016-10-13 (×2): qty 0.55

## 2016-10-13 MED ORDER — ENOXAPARIN SODIUM 60 MG/0.6ML ~~LOC~~ SOLN
1.0000 mg/kg | Freq: Once | SUBCUTANEOUS | Status: AC
  Administered 2016-10-13: 12:00:00 55 mg via SUBCUTANEOUS
  Filled 2016-10-13: qty 0.55

## 2016-10-13 MED ORDER — WARFARIN - PHARMACIST DOSING INPATIENT
Freq: Every day | Status: DC
  Administered 2016-10-14: 17:00:00

## 2016-10-13 MED ORDER — COUMADIN BOOK
Freq: Once | Status: AC
  Administered 2016-10-13: 16:00:00
  Filled 2016-10-13 (×2): qty 1

## 2016-10-13 NOTE — Evaluation (Signed)
Occupational Therapy Evaluation Patient Details Name: Mercedes Gardner MRN: [id number] DOB: 07-14-1946 Today's Date: 10/13/2016    History of Present Illness Patient is a 71 y/o female with hx of HTN, HLD, gout, anxiety, unintentional weight loss since September 2017 presents initially with transient left facial droop, numbness and slurred speech which resolved in EDm CT Head was negative so she was d/c home. EMS was called on 1/29 due to recurrence of similar symptoms. 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 She demonstrates the below listed deficits and will benefit from continued OT to maximize safety and independence with BADLs.  Pt presents with impaired balance, cognitive and visual deficits as well as weakness and incoordination Lt UE.  She requires min - mod A for ADLs.  Recommend CIR.       Follow Up Recommendations  CIR    Equipment Recommendations  None recommended by OT    Recommendations for Other Services Rehab consult     Precautions / Restrictions Precautions Precautions: Fall      Mobility Bed Mobility Overal bed mobility: Needs Assistance Bed Mobility: Supine to Sit;Sit to Supine     Supine to sit: Min guard Sit to supine: Min guard   General bed mobility comments: increased time and effort   Transfers Overall transfer level: Needs assistance Equipment used: 1 person hand held assist Transfers: Sit to/from [facility] Sit to Stand: Min assist Stand pivot transfers: Min assist       General transfer comment: min facilitation for balance     Balance Overall balance assessment: Needs assistance Sitting-balance support: Feet  supported;No upper extremity supported Sitting balance-Leahy Scale: Fair     Standing balance support: During functional activity;Single extremity supported Standing balance-Leahy Scale: Poor Standing balance comment: requires min A and UE support                             ADL Overall ADL's : Needs assistance/impaired Eating/Feeding: Set up;Sitting   Grooming: Wash/dry hands;Wash/dry face;Oral care;Brushing hair;Minimal assistance;Standing   Upper Body Bathing: Supervision/ safety;Sitting   Lower Body Bathing: Minimal assistance;Sit to/from stand Lower Body Bathing Details (indicate cue type and reason): assist for balance  Upper Body Dressing : Minimal assistance;Sitting   Lower Body Dressing: Minimal assistance;Sit to/from stand Lower Body Dressing Details (indicate cue type and reason): min A to pull socks over feet and for balance  Toilet Transfer: Minimal assistance;Stand-pivot;BSC   Toileting- Clothing Manipulation and Hygiene: Minimal assistance;Sit to/from stand       Functional mobility during ADLs: Minimal assistance;Rolling walker;+2 for safety/equipment       Vision Vision Assessment?: Yes Eye Alignment: Within Functional Limits Ocular Range of Motion: Within Functional Limits Tracking/Visual Pursuits: Impaired - to be further tested in functional context Saccades: Additional eye shifts occurred during testing Visual Fields: No apparent deficits Additional Comments: during occulomotor pursuits, pt frequently loses fixation on target and initiates frequent saccades    Perception Perception Perception Tested?: Yes   Praxis Praxis Praxis tested?: Within functional limits    Pertinent Vitals/Pain Pain Assessment: No/denies pain     Hand Dominance Right   Extremity/Trunk Assessment Upper Extremity Assessment Upper Extremity Assessment: LUE deficits/detail LUE Deficits / Details: 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  He is able to opose digits 3 on Rt hand  LUE Sensation: decreased light touch LUE Coordination: decreased fine motor   Lower Extremity Assessment Lower Extremity Assessment: Defer to PT evaluation       Communication Communication Communication: Expressive difficulties (dysarthria )   Cognition Arousal/Alertness: Awake/alert Behavior During Therapy: Impulsive Overall Cognitive Status: Impaired/Different from baseline Area of Impairment: Attention;Following commands;Safety/judgement;Awareness;Problem solving   Current Attention Level: Selective (pt easily self distracts requiring cues to redirect her )   Following Commands: Follows one step commands consistently Safety/Judgement: Decreased awareness of safety Awareness: Anticipatory Problem Solving: Difficulty sequencing;Requires verbal cues;Requires tactile cues General Comments: Pt is impulsive.  She quickly jumps from topic to topic self distracting.  She requires mod cues to redirect to task.  verbal cues for problem solving    General Comments       Exercises       Shoulder Instructions      Home Living Family/patient expects to be discharged to:: Inpatient rehab Living Arrangements: Children Available Help at Discharge: Family Type of Home: House Home Access: Stairs to enter Technical brewer of Steps: 3   Home Layout: One level               Home Equipment: Crutches      Lives With: Son    Prior Functioning/Environment Level of Independence: Independent        Comments: Works as Print production planner. Very active         OT Problem List: Decreased strength;Decreased activity tolerance;Impaired balance (sitting and/or standing);Impaired vision/perception;Decreased coordination;Decreased cognition;Decreased safety awareness;Decreased knowledge of use of DME or AE;Impaired sensation;Impaired UE functional use   OT Treatment/Interventions: Self-care/ADL training;Neuromuscular  education;DME and/or AE instruction;Therapeutic activities;Cognitive remediation/compensation;Visual/perceptual remediation/compensation;Patient/family education;Balance training    OT Goals(Current goals can be found in the care plan section) Acute Rehab OT Goals Patient Stated Goal: to go home and watch the Superbowl  OT Goal Formulation: With patient Time For Goal Achievement: 10/27/16 Potential to Achieve Goals: Good ADL Goals Pt Will Perform Grooming: with min guard assist Pt Will Perform Lower Body Bathing: with min guard assist;sit to/from stand Pt Will Perform Upper Body Dressing: with set-up;sitting Pt Will Perform Lower Body Dressing: with min guard assist;sit to/from stand Pt Will Transfer to Toilet: with min guard assist;ambulating;regular height toilet;bedside commode;grab bars Pt Will Perform Toileting - Clothing Manipulation and hygiene: with min guard assist;sit to/from stand  OT Frequency: Min 2X/week   Barriers to D/C:            Co-evaluation              End of Session Equipment Utilized During Treatment: Gait belt Nurse Communication: Mobility status  Activity Tolerance: Patient tolerated treatment well Patient left: in bed;with call bell/phone within reach;with bed alarm set;with family/visitor present   Time: [id number] OT Time Calculation (min): 31 min Charges:  OT General Charges $OT Visit: 1 Procedure OT Evaluation $OT Eval Moderate Complexity: 1 Procedure OT Treatments $Neuromuscular Re-education: 8-22 mins G-Codes:    Afsana Liera M 2016-11-12, 6:10 PM

## 2016-10-13 NOTE — Progress Notes (Signed)
  Echocardiogram 2D Echocardiogram has been performed.  Diamond Nickel 10/13/2016, 3:17 PM

## 2016-10-13 NOTE — Progress Notes (Signed)
Rehab admissions - I met with patient.  I gave her rehab booklets and explained about inpatient rehab.  She lives with her son who she says works from home.  Patient is interested in inpatient rehab admission.  Pending bed availability, I will try to get her into inpatient rehab tomorrow.  Call me for questions.  #[phone]

## 2016-10-13 NOTE — Progress Notes (Signed)
Pt received alert, verbal with no noted distress. Pt stable, neuro intact denying pain or discomfort. Pt oriented to room. Safety measures in place. Call bell within reach. Will continue to monitor.

## 2016-10-13 NOTE — Progress Notes (Addendum)
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 bilateral adnexa are unremarkable. Other: No free air free fluid. Small fat containing lower ventral abdominal wall hernia, likely at site of prior Caesarean section, no bowel involvement. No free air, free fluid, or intra-abdominal fluid collection. Musculoskeletal: There are no acute or suspicious osseous abnormalities. No blastic or destructive lytic lesions. Mild degenerative change in the spine and both hips. IMPRESSION: 1. No findings to explain unintentional weight loss. 2. Left adrenal nodule measures 13 mm. This is nonspecific. Recommend 12 month follow-up adrenal CT. 3. Mild emphysema. Small poorly defined nodules throughout both lungs, which may be inflammatory or secondary to smoking related lung disease. There is a ground-glass nodule in the right lower lobe measuring 8 mm, no definitive soft tissue component. Initial follow-up with CT at 6-12 months is recommended to confirm persistence. If persistent, repeat CT is recommended every 2 years until 5 years of stability has been established.  This recommendation follows the consensus statement: Guidelines for Management of Incidental Pulmonary Nodules Detected on CT Images: From the Fleischner Society 2017; Radiology 2017; 284:228-243. 4. No convincing findings of malignancy. 5. Thoracoabdominal aortic atherosclerosis. 6. Small fat containing lower ventral abdominal wall hernia. Electronically Signed   By: Jeb Levering M.D.   On: 10/12/2016 23:48   Dg Chest Port 1 View  Result Date: 10/11/2016 CLINICAL DATA:  71 year old female with weakness and confusion. Subsequent encounter. EXAM: PORTABLE CHEST 1 VIEW COMPARISON:  10/10/2016 and 01/31/2015 chest x-ray. FINDINGS: No infiltrate, congestive heart failure or pneumothorax. No plain film evidence of pulmonary malignancy. Heart size top-normal. Tortuous aorta which is partially calcified. No acute osseous abnormality. Acromioclavicular joint degenerative changes. IMPRESSION: No active disease. Electronically Signed   By: Genia Del M.D.   On: 10/11/2016 11:44     Scheduled Meds: .  stroke: mapping our early stages of recovery book   Does not apply Once  . amoxicillin-clavulanate  1 tablet Oral Q12H  . atorvastatin  80 mg Oral q1800  . coumadin book   Does not apply Once  . enoxaparin (LOVENOX) injection  1 mg/kg Subcutaneous Once   Followed by  . enoxaparin (LOVENOX) injection  1 mg/kg Subcutaneous Once  . [START ON 10/14/2016] enoxaparin (LOVENOX) injection  1 mg/kg Subcutaneous Q12H  . feeding supplement (ENSURE ENLIVE)  237 mL Oral BID BM  . ferrous sulfate  325 mg Oral BID WC  . fluticasone  2 spray Each Nare BID  . folic acid  1 mg Oral Daily  .  multivitamin with minerals  1 tablet Oral Daily  . nicotine  21 mg Transdermal Daily  . thiamine  100 mg Oral Daily  . warfarin  5 mg Oral ONCE-1800  . warfarin   Does not apply Once  . Warfarin - Pharmacist Dosing Inpatient   Does not apply q1800   Continuous Infusions:    LOS: 2 days   Time Spent in minutes   30  minutes  Desarai Barrack D.O. on 10/13/2016 at 11:13 AM  Between 7am to 7pm - Pager - [phone]  After 7pm go to [email or web address] - password [email or web address]  And look for the night coverage person covering for me after hours  [facility] Office  [phone]

## 2016-10-13 NOTE — Progress Notes (Signed)
-   Telemetry monitoring 

## 2016-10-13 NOTE — PMR Pre-admission (Signed)
PMR Admission Coordinator Pre-Admission Assessment  Patient: Mercedes Gardner is an 71 y.o., female MRN: [id number] DOB: 03-29-1946 Height: 5\' 5"  (165.1 cm) Weight: 56 kg (123 lb 7.3 oz)              Insurance Information HMO:     PPO:       PCP:       IPA:       80/20:       OTHER:   PRIMARY:  Medicare A/B      Policy#:  [id number]      Subscriber: Lauree Chandler CM Name:        Phone#:       Fax#:   Pre-Cert#:        Employer:  Retired, works PT Benefits:  Phone #:       Name: Checked in [employer]. Date:  06/13/13     Deduct:  $1340      Out of Pocket Max:  none      Life Max: unlimited CIR: 100%      SNF: 100 days Outpatient: 80%     Co-Pay: 20% Home Health: 100%      Co-Pay: none DME: 80%     Co-Pay: 20% Providers: patient's choice  SECONDARY: Medicaid  access      Policy#:  [id number]      Subscriber:  Lauree Chandler CM Name:        Phone#:       Fax#:   Pre-Cert#:        Employer:   Benefits:  Phone #: [phone]     Name:  Automated Eff. Date:  Eligible 10/13/16 with MAAQY coverage code     Deduct:        Out of Pocket Max:        Life Max:   CIR:        SNF:   Outpatient:       Co-Pay:   Home Health:        Co-Pay:   DME:       Co-Pay:    Emergency Contact Information Contact Information    Name Relation Home Work [place] Mother [phone]     Nephateria, Wiecek Daughter [phone]  [phone]   Yamileth, Lashure Daughter [phone]  [phone]   Temia, Jenkins [phone]       Current Medical History  Patient Admitting Diagnosis: Bibasilar infarct  History of Present Illness: A 71 y.o.right handed femalewith history of hypertension, hyperlipidemia and tobacco abuse. Presented 10/10/2016 with waxing and waning symptoms of intermittent slurred speechand left-sided weakness. Noted 20 pound weight loss since September and poor eating habits. Per chart review patient lives with son independent prior to admission. Works as a Art therapist.Son works from home and can assist.MRI of the brain showed acute early subacute infarction within the right hemi-pons and multiple additional small foci in the right cerebellar hemisphere, left pontomedullary junction, right occipital lobe and right paramedian parietal lobe.WBC initially elevated 19,100 felt to be reactive improved to 6.9. 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 Lower extremity Dopplers negative for DVT. Carotid Doppler showed no ICA stenosis. Patient did not receive TPA. Echocardiogram with ejection fraction of 65% and grade 2 diastolic dysfunction. No regional wall motion abnormalities . CT chest abdomen pelvis completed for history of recent weight loss showing no evidence of malignancy. There was some incidental findings of pulmonary nodules recommend CT repeat in 6-12  months. 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 Advised need to repeat CTA of head and neck in 2-3 months to decide duration of anticoagulation use. Patient currently completing a course of Augmentin for suspect sinusitis and chest x-ray was completed that was negative. Bouts of hypokalemia 2.6 corrected with potassium supplement. Physical and occupational therapy evaluation completed with recommendations of physical medicine rehabilitation consult. Patient to be admitted for a comprehensive inpatient rehabilitation program.    Total: 5=NIH  Past Medical History  Past Medical History:  Diagnosis Date  . Anemia   . Anxiety   . Arthritis    knees   . Dysrhythmia    heart skips a beat   . GERD (gastroesophageal reflux disease)   . Gout   . Hyperlipidemia April 2014  . Hypertension   . Shortness of breath dyspnea    due to pressure of cyst per patient     Family History  family history includes Cancer in her mother; Diabetes in her mother and sister; Hypertension in her mother and  sister.  Prior Rehab/Hospitalizations: No previous rehab admissions.  Has the patient had major surgery during 100 days prior to admission? No  Current Medications   Current Facility-Administered Medications:  .   stroke: mapping our early stages of recovery book, , Does not apply, Once, Rosalin Hawking, MD .  acetaminophen (TYLENOL) tablet 650 mg, 650 mg, Oral, Q4H PRN, 650 mg at 10/13/16 2352 **OR** [DISCONTINUED] acetaminophen (TYLENOL) solution 650 mg, 650 mg, Per Tube, Q4H PRN **OR** [DISCONTINUED] acetaminophen (TYLENOL) suppository 650 mg, 650 mg, Rectal, Q4H PRN, Samella Parr, NP .  amoxicillin-clavulanate (AUGMENTIN) 875-125 MG per tablet 1 tablet, 1 tablet, Oral, Q12H, Maryann Mikhail, DO, 1 tablet at 10/13/16 2312 .  atorvastatin (LIPITOR) tablet 80 mg, 80 mg, Oral, q1800, Rosalin Hawking, MD, 80 mg at 10/13/16 1655 .  enoxaparin (LOVENOX) injection 55 mg, 1 mg/kg, Subcutaneous, Q12H, Rebecka Apley, RPH .  feeding supplement (ENSURE ENLIVE) (ENSURE ENLIVE) liquid 237 mL, 237 mL, Oral, BID BM, Rosalin Hawking, MD, 237 mL at 10/13/16 1400 .  ferrous sulfate tablet 325 mg, 325 mg, Oral, BID WC, Maryann Mikhail, DO, 325 mg at 10/13/16 1655 .  fluticasone (FLONASE) 50 MCG/ACT nasal spray 2 spray, 2 spray, Each Nare, BID, Samella Parr, NP, 2 spray at 10/13/16 2312 .  folic acid (FOLVITE) tablet 1 mg, 1 mg, Oral, Daily, Rosalin Hawking, MD, 1 mg at 10/13/16 0901 .  iopamidol (ISOVUE-M) 61 % intrathecal injection 15 mL, 15 mL, Intrathecal, Once PRN, Rosalin Hawking, MD .  LORazepam (ATIVAN) tablet 1 mg, 1 mg, Oral, Q6H PRN **OR** LORazepam (ATIVAN) injection 1 mg, 1 mg, Intravenous, Q6H PRN, Rosalin Hawking, MD .  multivitamin with minerals tablet 1 tablet, 1 tablet, Oral, Daily, Rosalin Hawking, MD, 1 tablet at 10/13/16 0901 .  nicotine (NICODERM CQ - dosed in mg/24 hours) patch 21 mg, 21 mg, Transdermal, Daily, Samella Parr, NP .  senna-docusate (Senokot-S) tablet 1 tablet, 1 tablet, Oral, QHS PRN, Rosalin Hawking, MD .   thiamine (VITAMIN B-1) tablet 100 mg, 100 mg, Oral, Daily, 100 mg at 10/13/16 0901 **OR** [DISCONTINUED] thiamine (B-1) injection 100 mg, 100 mg, Intravenous, Daily, Rosalin Hawking, MD .  warfarin (COUMADIN) tablet 5 mg, 5 mg, Oral, ONCE-1800, Rachel L Rumbarger, RPH .  Warfarin - Pharmacist Dosing Inpatient, , Does not apply, q1800, Rebecka Apley, [facility]  Patients Current Diet: Diet Heart Room service appropriate? Yes;  Fluid consistency: Thin  Precautions / Restrictions Precautions Precautions: Fall Restrictions Weight Bearing Restrictions: No   Has the patient had 2 or more falls or a fall with injury in the past year?No  Prior Activity Level Community (5-7x/wk): Went out daily.  Was active.  Did not drive.  Worked PT at childcare center.  Home Assistive Devices / Equipment Home Assistive Devices/Equipment: Eyeglasses Home Equipment: Crutches  Prior Device Use: Indicate devices/aids used by the patient prior to current illness, exacerbation or injury? None  Prior Functional Level Prior Function Level of Independence: Independent Comments: Works as Print production planner. Very active   Self Care: Did the patient need help bathing, dressing, using the toilet or eating?  Independent  Indoor Mobility: Did the patient need assistance with walking from room to room (with or without device)? Independent  Stairs: Did the patient need assistance with internal or external stairs (with or without device)? Independent  Functional Cognition: Did the patient need help planning regular tasks such as shopping or remembering to take medications? Independent  Current Functional Level Cognition  Overall Cognitive Status: Impaired/Different from baseline Current Attention Level: Selective (pt easily self distracts requiring cues to redirect her ) Orientation Level: Oriented X4 Following Commands: Follows one step commands consistently Safety/Judgement: Decreased awareness of safety General Comments: Pt  is impulsive.  She quickly jumps from topic to topic self distracting.  She requires mod cues to redirect to task.  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  He is able to opose digits 3 on Rt hand  LUE Sensation: decreased light touch LUE Coordination: decreased fine motor  Lower Extremity Assessment: Defer to PT evaluation LLE Deficits / Details: Grossly ~3-4/5 throughout, some incoordination. LLE Sensation: decreased light touch LLE Coordination: decreased fine motor, decreased gross motor    ADLs  Overall ADL's : Needs assistance/impaired Eating/Feeding: Set up, Sitting Grooming: Wash/dry hands, Wash/dry face, Oral care, Brushing hair, Minimal assistance, Standing Upper Body Bathing: Supervision/ safety, Sitting Lower Body Bathing: Minimal assistance, Sit to/from stand Lower Body Bathing Details (indicate cue type and reason): assist for balance  Upper Body Dressing : Minimal assistance, Sitting Lower Body Dressing: Minimal assistance, Sit to/from stand Lower Body Dressing Details (indicate cue type and reason): min A to pull socks over feet and for balance  Toilet Transfer: Minimal assistance, Stand-pivot, BSC Toileting- Clothing Manipulation and Hygiene: Minimal assistance, Sit to/from stand Functional mobility during ADLs: Minimal assistance, Rolling walker, +2 for safety/equipment    Mobility  Overal bed mobility: Needs Assistance Bed Mobility: Supine to Sit, Sit to Supine Supine to sit: Min guard Sit to supine: Min guard General bed mobility comments: increased time and effort     Transfers  Overall transfer level: Needs assistance Equipment used: 1 person hand held assist Transfers: Sit to/from Stand, Stand Pivot Transfers Sit to Stand: Min assist Stand pivot transfers:  Min assist General transfer comment: min facilitation for balance     Ambulation / Gait / Stairs / Wheelchair Mobility  Ambulation/Gait Ambulation/Gait assistance: Mod assist Ambulation Distance (Feet): 50 Feet Assistive device: 1 person hand held assist Gait Pattern/deviations: Step-through pattern, Decreased stride length, Decreased dorsiflexion - left, Scissoring, Ataxic General Gait Details: pt with some ataxia and decreased proprioception.  pt at times stepping on her own foot without realization.  pt's L knee weak and with instability in  stance.      Posture / Balance Dynamic Sitting Balance Sitting balance - Comments: pt begins to drift to R, but is able to self correct without cues.   Balance Overall balance assessment: Needs assistance Sitting-balance support: Feet supported, No upper extremity supported Sitting balance-Leahy Scale: Fair Sitting balance - Comments: pt begins to drift to R, but is able to self correct without cues.   Standing balance support: During functional activity, Single extremity supported Standing balance-Leahy Scale: Poor Standing balance comment: requires min A and UE support     Special needs/care consideration BiPAP/CPAP No CPM No Continuous Drip IV Yes, heparin drip  Dialysis No        Life Vest No Oxygen No Special Bed No Trach Size No Wound Vac (area) No       Skin No                            Bowel mgmt: Last BM 10/12/16 Bladder mgmt: Voiding in bathroom with assistance Diabetic mgmt No    Previous Home Environment Living Arrangements: Children  Lives With: Son Available Help at Discharge: Family Type of Home: House Home Layout: One level Home Access: Stairs to enter Technical brewer of Steps: 3 Home Care Services: No  Discharge Living Setting Plans for Discharge Living Setting: House, Lives with (comment) (Lives with 64 yo son.) Type of Home at Discharge: House Discharge Home Layout: One level Discharge Home Access:  Stairs to enter Entrance Stairs-Number of Steps: 3 Does the patient have any problems obtaining your medications?: No  Social/Family/Support Systems Patient Roles: Parent (Has a son and a daughter.) Contact Information: Blakelynn Sekelsky - son - [phone] Anticipated Caregiver: Son and self Ability/Limitations of Caregiver: Son works from home.  Daughter lives in [place]. Caregiver Availability: Intermittent Discharge Plan Discussed with Primary Caregiver: Yes Is Caregiver In Agreement with Plan?: Yes Does Caregiver/Family have Issues with Lodging/Transportation while Pt is in Rehab?: No  Goals/Additional Needs Patient/Family Goal for Rehab: PT/OT mod I and supervision, SLP mod I goals Expected length of stay: ? 7 days Cultural Considerations: Baptist.  Has bible studay on Saturdays with her daughter by phone.  Dtr lives in [place]. Dietary Needs: Heart diet, thin liquids Equipment Needs: TBD Pt/Family Agrees to Admission and willing to participate: Yes Program Orientation Provided & Reviewed with Pt/Caregiver Including Roles  & Responsibilities: Yes  Decrease burden of Care through IP rehab admission: N/A  Possible need for SNF placement upon discharge: Not anticipated  Patient Condition: This patient's condition remains as documented in the consult dated 10/12/16, in which the Rehabilitation Physician determined and documented that the patient's condition is appropriate for intensive rehabilitative care in an inpatient rehabilitation facility. Will admit to inpatient rehab today.  Preadmission Screen Completed By:  Retta Diones, 10/14/2016 10:34 AM ______________________________________________________________________   Discussed status with Dr. Letta Pate on 10/14/16 at 1035 and received telephone approval for admission today.  Admission Coordinator:  Retta Diones, time 1035/Date 10/14/16

## 2016-10-13 NOTE — Progress Notes (Signed)
[place] for Heparin transition to lovenox and warfarin Indication: Basilar artery thrombosis  Allergies  Allergen Reactions  . Tramadol     Per pt, she got depressed, moody, and had increased back pain when she took tramadol    Patient Measurements: Height: 5\' 5"  (165.1 cm) Weight: 123 lb 7.3 oz (56 kg) IBW/kg (Calculated) : 57  Vital Signs: Temp: 98.3 F (36.8 C) (01/31 0800) Temp Source: Oral (01/31 0800) BP: 134/98 (01/31 1000) Pulse Rate: 84 (01/31 1000)  Labs:  Recent Labs  10/10/16 1035  10/11/16 0921  10/12/16 0547 10/12/16 1219 10/13/16 0426  HGB 12.0  < > 11.7*  --  11.2*  --  10.7*  HCT 36.5  < > 36.4  --  34.6*  --  33.5*  PLT 234  --  293  --  227  --  219  APTT 28  --   --   --   --   --   --   LABPROT 13.1  --   --   --   --   --   --   INR 0.99  --   --   --   --   --   --   HEPARINUNFRC  --   --   --   < > 0.28* 0.34 0.35  CREATININE 0.76  < > 0.84  --  0.71  --  0.83  < > = values in this interval not displayed.  Estimated Creatinine Clearance (by C-G formula based on SCr of 0.83 mg/dL) Female: 57.3 mL/min Female: 67.5 mL/min   Assessment: Heparin for basilar artery thrombosis, heparin level now therapeutic x 2. Pharmacy is consulted to transition patient from heparin to lovenox and warfarin.  Goal of Therapy:  INR 2-3 Monitor platelets by anticoagulation protocol: Yes   Plan:  Stop heparin Start lovenox 1mg /kg subcutaneously q12h Warfarin 5mg  tonight x1 Daily INR/CBC Monitor s/sx of bleeding Educate patient on warfarin  Andrey Cota. Diona Foley, PharmD, BCPS Clinical Pharmacist [phone] 10/13/2016,10:27 AM

## 2016-10-13 NOTE — Progress Notes (Signed)
SLP Cancellation Note  Patient Details Name: Mercedes Gardner MRN: [id number] DOB: 1946-04-10   Cancelled treatment:       Reason Eval/Treat Not Completed: Patient at procedure or test/unavailable   ADAMS,PAT, M.S., CCC-SLP 10/13/2016, 4:31 PM

## 2016-10-13 NOTE — Progress Notes (Signed)
ANTICOAGULATION CONSULT NOTE - Follow Up Consult  Pharmacy Consult for Heparin  Indication: Basilar artery thrombosis  Allergies  Allergen Reactions  . Tramadol     Per pt, she got depressed, moody, and had increased back pain when she took tramadol    Patient Measurements: Height: 5\' 5"  (165.1 cm) Weight: 123 lb 7.3 oz (56 kg) IBW/kg (Calculated) : 57  Vital Signs: Temp: 97.8 F (36.6 C) (01/31 0400) Temp Source: Oral (01/31 0400) BP: 125/83 (01/31 0500) Pulse Rate: 81 (01/31 0500)  Labs:  Recent Labs  10/10/16 1035 10/10/16 1042 10/11/16 0921  10/12/16 0547 10/12/16 1219 10/13/16 0426  HGB 12.0 12.6 11.7*  --  11.2*  --  10.7*  HCT 36.5 37.0 36.4  --  34.6*  --  33.5*  PLT 234  --  293  --  227  --  219  APTT 28  --   --   --   --   --   --   LABPROT 13.1  --   --   --   --   --   --   INR 0.99  --   --   --   --   --   --   HEPARINUNFRC  --   --   --   < > 0.28* 0.34 0.35  CREATININE 0.76 0.70 0.84  --  0.71  --   --   < > = values in this interval not displayed.  Estimated Creatinine Clearance (by C-G formula based on SCr of 0.71 mg/dL) Female: 59.5 mL/min Female: 70 mL/min   Assessment: Heparin for basilar artery thrombosis, heparin level now therapeutic x 2  Goal of Therapy:  Heparin level 0.3-0.5 units/ml Monitor platelets by anticoagulation protocol: Yes   Plan:  -Cont heparin 850 units/hr -Daily CBC/HL  Narda Bonds 10/13/2016,5:43 AM

## 2016-10-13 NOTE — Discharge Instructions (Signed)

## 2016-10-13 NOTE — Progress Notes (Signed)
Physical Therapy Treatment Patient Details Name: Mercedes Gardner MRN: [id number] DOB: 1946/01/09 Today's Date: 10/13/2016    History of Present Illness Patient is a 71 y/o female with hx of HTN, HLD, gout, anxiety, unintentional weight loss since September 2017 presents initially with transient left facial droop, numbness and slurred speech which resolved in EDm CT Head was negative so she was d/c home. EMS was called on 1/29 due to recurrence of similar symptoms. 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  Pt able to ambulate in hallway, but needed a chair follow due to increased weakness with fatigue.  Feel pt would be a great candidate for CIR level of therapies at D/C.    Follow Up Recommendations  CIR     Equipment Recommendations  None recommended by PT    Recommendations for Other Services       Precautions / Restrictions Precautions Precautions: Fall Restrictions Weight Bearing Restrictions: No    Mobility  Bed Mobility Overal bed mobility: Needs Assistance Bed Mobility: Supine to Sit;Sit to Supine     Supine to sit: Min guard;HOB elevated Sit to supine: Min guard   General bed mobility comments: pt needs increased time, but is able to complete without A.    Transfers Overall transfer level: Needs assistance Equipment used: 1 person hand held assist Transfers: Sit to/from Stand Sit to Stand: Min assist         General transfer comment: cues for positioning of Bil feet and technique.  pt tends to want to pull on PT for support, but needs cues for proper UE use.  pt unsteady and leans to R side.     Ambulation/Gait Ambulation/Gait assistance: Mod assist Ambulation Distance (Feet): 50 Feet Assistive device: 1 person hand held assist Gait Pattern/deviations: Step-through pattern;Decreased stride length;Decreased dorsiflexion - left;Scissoring;Ataxic     General Gait Details: pt with some ataxia and decreased proprioception.  pt at times stepping on her own foot without realization.  pt's L knee weak and with instability in stance.     Stairs            Wheelchair Mobility    Modified Rankin (Stroke Patients Only)       Balance Overall balance assessment: Needs assistance Sitting-balance support: No upper extremity supported;Feet supported Sitting balance-Leahy Scale: Fair Sitting balance - Comments: pt begins to drift to R, but is able to self correct without cues.     Standing balance support: During functional activity;Single extremity supported Standing balance-Leahy Scale: Poor Standing balance comment: Reliant on external support for standing balance.                     Cognition Arousal/Alertness: Awake/alert Behavior During Therapy: Impulsive Overall Cognitive Status: Impaired/Different from baseline Area of Impairment: Attention;Problem solving;Safety/judgement;Awareness   Current Attention Level: Alternating     Safety/Judgement: Decreased awareness of deficits;Decreased awareness of safety Awareness: Anticipatory Problem Solving: Difficulty sequencing;Requires verbal cues      Exercises      General Comments        Pertinent Vitals/Pain Pain Assessment: No/denies pain    Home Living  Prior Function            PT Goals (current goals can now be found in the care plan section) Acute Rehab PT Goals Patient Stated Goal: to get back to normal PT Goal Formulation: With patient Time For Goal Achievement: 10/26/16 Potential to Achieve Goals: Good Progress towards PT goals: Progressing toward goals     Frequency    Min 4X/week      PT Plan Current plan remains appropriate    Co-evaluation             End of Session Equipment Utilized During Treatment: Gait belt Activity Tolerance: Patient tolerated treatment well Patient left: in bed;with call bell/phone within reach;with bed alarm set     Time: 1041-1103 PT Time Calculation (min) (ACUTE ONLY): 22 min  Charges:  $Gait Training: 8-22 mins                    G Codes:      Dionysios Massman F Rubyann Lingle 11-07-2016, 1:19 PM

## 2016-10-13 NOTE — Progress Notes (Signed)
STROKE TEAM PROGRESS NOTE   SUBJECTIVE (INTERVAL HISTORY) Son is at bedside. Pt left sided weakness continues to improve. On heparin drip tolerating well. CT chest and abd and pelvis ruled out malignancy so far. Will transition to lovenox and coumadin. Transfer to floor    OBJECTIVE Temp:  [97.8 F (36.6 C)-98.4 F (36.9 C)] 98.4 F (36.9 C) (01/31 1200) Pulse Rate:  [67-94] 88 (01/31 1400) Cardiac Rhythm: Normal sinus rhythm (01/31 0800) Resp:  [13-25] 16 (01/31 1400) BP: (106-145)/(56-104) 132/73 (01/31 1400) SpO2:  [97 %-100 %] 99 % (01/31 1400)  CBC:  Recent Labs Lab 10/10/16 1035  10/12/16 0547 10/13/16 0426  WBC 9.5  < > 8.0 6.6  NEUTROABS 6.8  --  5.8  --   HGB 12.0  < > 11.2* 10.7*  HCT 36.5  < > 34.6* 33.5*  MCV 93.4  < > 93.8 93.8  PLT 234  < > 227 219  < > = values in this interval not displayed.  Basic Metabolic Panel:   Recent Labs Lab 10/11/16 1117 10/12/16 0547 10/13/16 0426  NA  --  143 139  K  --  3.1* 4.1  CL  --  108 109  CO2  --  26 23  GLUCOSE  --  102* 107*  BUN  --  7 13  CREATININE  --  0.71 0.83  CALCIUM  --  9.8 9.9  MG 2.2  --   --     Lipid Panel:     Component Value Date/Time   CHOL 190 10/12/2016 0547   TRIG 97 10/12/2016 0547   HDL 42 10/12/2016 0547   CHOLHDL 4.5 10/12/2016 0547   VLDL 19 10/12/2016 0547   LDLCALC 129 (H) 10/12/2016 0547   HgbA1c:  Lab Results  Component Value Date   HGBA1C 5.4 10/12/2016   Urine Drug Screen:     Component Value Date/Time   LABOPIA POSITIVE (A) 10/11/2016 1125   COCAINSCRNUR NONE DETECTED 10/11/2016 1125   LABBENZ NONE DETECTED 10/11/2016 1125   AMPHETMU NONE DETECTED 10/11/2016 1125   THCU NONE DETECTED 10/11/2016 1125   LABBARB NONE DETECTED 10/11/2016 1125      IMAGING I have personally reviewed the radiological images below and agree with the radiology interpretations.  Dg Chest 2 View 10/11/2016 No active disease.  10/10/2016 No active cardiopulmonary disease.   Ct  Head Wo Contrast 10/11/2016 Normal head CT.   Ct Angio Head W Or Wo Contrast Ct Angio Neck W Or Wo Contrast 10/11/2016 1. 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 No visualized acute infarct. 2. Cervical carotid atherosclerosis without flow limiting stenosis. Moderate left cavernous ICA atheromatous narrowing. 3. Mild borderline moderate left subclavian origin stenosis. 4. Micronodules in the bilateral lungs, suspect respiratory bronchiolitis in this smoker. Emphysema is present. Full noncontrast chest CT recommended after convalescence.   Mr Brain Wo Contrast 10/11/2016 Acute/early subacute infarction within the right hemi pons and multiple additional small foci in the right cerebellar hemisphere, right brachium pontis, left pontomedullary junction, right occipital lobe, and right paramedian parietal lobe. No hemorrhage identified.   Ct Chest Abdomen Pelvis W Contrast 10/12/2016 IMPRESSION: 1. No findings to explain unintentional weight loss. 2. Left adrenal nodule measures 13 mm. This is nonspecific. Recommend 12 month follow-up adrenal CT. 3. Mild emphysema. Small poorly defined nodules throughout both lungs, which may be inflammatory or secondary to smoking related lung disease. There is a ground-glass nodule in the right lower lobe measuring 8 mm,  no definitive soft tissue component. Initial follow-up with CT at 6-12 months is recommended to confirm persistence. If persistent, repeat CT is recommended every 2 years until 5 years of stability has been established. This recommendation follows the consensus statement: Guidelines for Management of Incidental Pulmonary Nodules Detected on CT Images: From the Fleischner Society 2017; Radiology 2017; 284:228-243. 4. No convincing findings of malignancy. 5. Thoracoabdominal aortic atherosclerosis. 6. Small fat containing lower ventral abdominal wall hernia.    CUS - Bilateral: 1-39% ICA stenosis. Vertebral artery flow is  antegrade.  LE venous doppler - no DVT  TTE pending   PHYSICAL EXAM  General - The patient's general appearance was well nourished, well developed, in no apparent distress.    Ophthalmologic - fundi not visualized due to noncooperation.    Cardiovascular - Ausculation of the heart revealed regular rate  Mental Status -  Level of arousal and orientation to time, place, and person were intact. Language including expression, naming, repetition, comprehension was found intact, however, mild to moderate dysarthria. Fund of Knowledge was assessed and was intact.  Cranial Nerves II - XII - II - Vision intact OU. III, IV, VI - Extraocular movements intact. V - Facial sensation intact bilaterally. VII - mild left facial droop. VIII - Hearing & vestibular intact bilaterally. X - Palate elevates symmetrically, mild to moderate dysarthria. XI - Chin turning & shoulder shrug intact bilaterally. XII - Tongue protrusion intact.  Motor Strength - The patient's strength was normal RUE and RLE, however, left UE 3+/5 and left LE 4/5.   Motor Tone & Bulk - Muscle tone was assessed at the neck and appendages and found to be normal.  Bulk was normal and fasciculations were absent.   Reflexes - The patient's reflexes were normal in all extremities and she had no pathological reflexes.  Sensory - Light touch, temperature/pinprick were assessed and were normal.    Coordination - The patient had normal movements in the right hand with no ataxia or dysmetria, left FTN ataxic but not out of proportional to weakness.  Tremor was absent.  Gait and Station - not able to test.   ASSESSMENT/PLAN Ms. MIKAEL DRYER is a 71 y.o. female with history of HTN, smoker, anxiety, gout, and unintentional weight loss since September 2017 presenting with 2 day hx of 5-6 episodes of left facial drooping and numbness,left sided weakness and slurred speech. She did not receive IV t-PA due to delay in  presentation. CT of the head no acute changes. As per patient and family, her symptoms never resolved back to normal, however fluctuating better or worse over the time. Initially ER patient symptoms improved, however later symptoms returned. 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 Heparin IV started and she was admitted to ICU for fluctuating BA thrombosis.   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 Micronodules in bilateral lungs  MRI  acute/subacute right hemi-pons and multiple small foci infarcts in the right cerebellar hemisphere, right brachium pontis, left pontomedullary junction, right occipital lobe, right paramedian parietal lobe infarcts  Carotid Dopplers unremarkable  2D Echo  pending  LEvenous Dopplers negative for DVT  LDL 129   HgbA1c 5.4  IV heparin for VTE prophylaxis Diet Heart Room service appropriate? Yes; Fluid consistency: Thin  No antithrombotic prior to admission, now on heparin  IV. Transition to coumadin with lovenox bridging today.  INR goal 2-3.   Will need to repeat CTA head and neck in 2-3 months to decide duration of anticoagulation use.  Patient counseled to be compliant with her antithrombotic medications  Ongoing aggressive stroke risk factor management  Therapy recommendations:  Pending  Disposition:  Pending  Unintentional weight loss  CTA neck showed micronodules in both lungs  Heavy smoker for long time  DVT negative  CT chest, abd/pelvis no definite evidence for malignancy  Hypertension  Stable Permissive hypertension (OK if < 180/105) but gradually normalize in 5-7 days Long-term BP goal normotensive  Hyperlipidemia  Home meds:  No statin (had been on mevacor in the past, stopped 08/19/2013)  LDL 129, goal < 70  On lipitor  80mg   Continue statin on discharge  Tobacco abuse  Current smoker  Smoking cessation counseling provided  Nicotine patch provided  Pt is willing to quit  Other Stroke Risk Factors  Advanced age  Unhealthy life styles  Other Active Problems  Failure to thrive   Leukocytosis 19.1->8.0->6.6  Chronic pain/ gout  Acute hypokalemia - on supplement  Normocytic anemia  Pneumonia, on augmentin   Hospital day # 2  Rosalin Hawking, MD PhD Stroke Neurology 10/13/2016 2:47 PM   To contact Stroke Continuity provider, please refer to [email or web address] After hours, contact General Neurology

## 2016-10-14 ENCOUNTER — Inpatient Hospital Stay (HOSPITAL_COMMUNITY)
Admission: RE | Admit: 2016-10-14 | Discharge: 2016-10-22 | DRG: 057 | Disposition: A | Discharge status: Home / Self Care | Payer: Medicare Other | Source: Intra-hospital | Attending: Physical Medicine & Rehabilitation | Admitting: Physical Medicine & Rehabilitation

## 2016-10-14 DIAGNOSIS — I639 Cerebral infarction, unspecified: Secondary | ICD-10-CM

## 2016-10-14 DIAGNOSIS — M109 Gout, unspecified: Secondary | ICD-10-CM | POA: Diagnosis not present

## 2016-10-14 DIAGNOSIS — R634 Abnormal weight loss: Secondary | ICD-10-CM | POA: Diagnosis not present

## 2016-10-14 DIAGNOSIS — E8809 Other disorders of plasma-protein metabolism, not elsewhere classified: Secondary | ICD-10-CM | POA: Diagnosis not present

## 2016-10-14 DIAGNOSIS — F419 Anxiety disorder, unspecified: Secondary | ICD-10-CM | POA: Diagnosis not present

## 2016-10-14 DIAGNOSIS — Z79899 Other long term (current) drug therapy: Secondary | ICD-10-CM

## 2016-10-14 DIAGNOSIS — I6330 Cerebral infarction due to thrombosis of unspecified cerebral artery: Secondary | ICD-10-CM

## 2016-10-14 DIAGNOSIS — E876 Hypokalemia: Secondary | ICD-10-CM | POA: Diagnosis not present

## 2016-10-14 DIAGNOSIS — I69392 Facial weakness following cerebral infarction: Secondary | ICD-10-CM

## 2016-10-14 DIAGNOSIS — I69393 Ataxia following cerebral infarction: Secondary | ICD-10-CM

## 2016-10-14 DIAGNOSIS — Z7951 Long term (current) use of inhaled steroids: Secondary | ICD-10-CM

## 2016-10-14 DIAGNOSIS — I69398 Other sequelae of cerebral infarction: Secondary | ICD-10-CM | POA: Diagnosis not present

## 2016-10-14 DIAGNOSIS — M2012 Hallux valgus (acquired), left foot: Secondary | ICD-10-CM

## 2016-10-14 DIAGNOSIS — I69354 Hemiplegia and hemiparesis following cerebral infarction affecting left non-dominant side: Secondary | ICD-10-CM | POA: Diagnosis not present

## 2016-10-14 DIAGNOSIS — I69322 Dysarthria following cerebral infarction: Secondary | ICD-10-CM | POA: Diagnosis not present

## 2016-10-14 DIAGNOSIS — D649 Anemia, unspecified: Secondary | ICD-10-CM | POA: Diagnosis not present

## 2016-10-14 DIAGNOSIS — F17210 Nicotine dependence, cigarettes, uncomplicated: Secondary | ICD-10-CM

## 2016-10-14 DIAGNOSIS — K5900 Constipation, unspecified: Secondary | ICD-10-CM

## 2016-10-14 DIAGNOSIS — D72829 Elevated white blood cell count, unspecified: Secondary | ICD-10-CM

## 2016-10-14 DIAGNOSIS — E785 Hyperlipidemia, unspecified: Secondary | ICD-10-CM | POA: Diagnosis not present

## 2016-10-14 DIAGNOSIS — J329 Chronic sinusitis, unspecified: Secondary | ICD-10-CM | POA: Diagnosis not present

## 2016-10-14 DIAGNOSIS — M2011 Hallux valgus (acquired), right foot: Secondary | ICD-10-CM

## 2016-10-14 DIAGNOSIS — I10 Essential (primary) hypertension: Secondary | ICD-10-CM

## 2016-10-14 DIAGNOSIS — K219 Gastro-esophageal reflux disease without esophagitis: Secondary | ICD-10-CM | POA: Diagnosis not present

## 2016-10-14 DIAGNOSIS — R269 Unspecified abnormalities of gait and mobility: Secondary | ICD-10-CM

## 2016-10-14 DIAGNOSIS — I6302 Cerebral infarction due to thrombosis of basilar artery: Secondary | ICD-10-CM | POA: Diagnosis present

## 2016-10-14 HISTORY — DX: Cerebral infarction, unspecified: I63.9

## 2016-10-14 LAB — BASIC METABOLIC PANEL
Anion gap: 9 (ref 5–15)
BUN: 17 mg/dL (ref 6–20)
CHLORIDE: 107 mmol/L (ref 101–111)
CO2: 24 mmol/L (ref 22–32)
Calcium: 10.3 mg/dL (ref 8.9–10.3)
Creatinine, Ser: 0.83 mg/dL (ref 0.44–1.00)
GFR calc Af Amer: >60 mL/min (ref >60)
GFR calc non Af Amer: >60 mL/min (ref >60)
GLUCOSE: 124 mg/dL — AB (ref 65–99)
Potassium: 3.7 mmol/L (ref 3.5–5.1)
Sodium: 140 mmol/L (ref 135–145)

## 2016-10-14 LAB — CBC
HCT: 35.2 % — ABNORMAL LOW (ref 36.0–46.0)
HEMOGLOBIN: 11.1 g/dL — AB (ref 12.0–15.0)
MCH: 29.8 pg (ref 26.0–34.0)
MCHC: 31.5 g/dL (ref 30.0–36.0)
MCV: 94.6 fL (ref 78.0–100.0)
PLATELETS: 220 10*3/uL (ref 150–400)
RBC: 3.72 MIL/uL — ABNORMAL LOW (ref 3.87–5.11)
RDW: 13.2 % (ref 11.5–15.5)
WBC: 6.9 10*3/uL (ref 4.0–10.5)

## 2016-10-14 LAB — PROTIME-INR
INR: 0.99
Prothrombin Time: 13.1 seconds (ref 11.4–15.2)

## 2016-10-14 MED ORDER — ATORVASTATIN CALCIUM 80 MG PO TABS: 80.0000 mg | ORAL_TABLET | Freq: Every day | ORAL | 0 refills | Status: DC

## 2016-10-14 MED ORDER — WARFARIN - PHARMACIST DOSING INPATIENT
Freq: Every day | Status: DC
  Administered 2016-10-20 – 2016-10-21 (×2)

## 2016-10-14 MED ORDER — NICOTINE 21 MG/24HR TD PT24
21.0000 mg | MEDICATED_PATCH | Freq: Every day | TRANSDERMAL | Status: DC
  Administered 2016-10-15 – 2016-10-22 (×8): 21 mg via TRANSDERMAL
  Filled 2016-10-14 (×8): qty 1

## 2016-10-14 MED ORDER — SENNOSIDES-DOCUSATE SODIUM 8.6-50 MG PO TABS: 1.0000 | ORAL_TABLET | Freq: Every evening | ORAL | Status: DC | PRN

## 2016-10-14 MED ORDER — VITAMIN B-1 100 MG PO TABS
100.0000 mg | ORAL_TABLET | Freq: Every day | ORAL | Status: DC
  Administered 2016-10-15 – 2016-10-22 (×8): 100 mg via ORAL
  Filled 2016-10-14 (×8): qty 1

## 2016-10-14 MED ORDER — AMOXICILLIN-POT CLAVULANATE 875-125 MG PO TABS
1.0000 | ORAL_TABLET | Freq: Two times a day (BID) | ORAL | Status: DC
Start: 2016-10-14 — End: 2016-10-22
  Administered 2016-10-14 – 2016-10-22 (×16): 1 via ORAL
  Filled 2016-10-14 (×16): qty 1

## 2016-10-14 MED ORDER — FLUTICASONE PROPIONATE 50 MCG/ACT NA SUSP
2.0000 | Freq: Two times a day (BID) | NASAL | Status: DC
  Administered 2016-10-15 – 2016-10-22 (×13): 2 via NASAL
  Filled 2016-10-14: qty 16

## 2016-10-14 MED ORDER — WARFARIN SODIUM 5 MG PO TABS: 5.0000 mg | ORAL_TABLET | Freq: Once | ORAL | 0 refills | Status: DC

## 2016-10-14 MED ORDER — SENNOSIDES-DOCUSATE SODIUM 8.6-50 MG PO TABS: 1.0000 | ORAL_TABLET | Freq: Every evening | ORAL | 0 refills | Status: DC | PRN

## 2016-10-14 MED ORDER — ONDANSETRON HCL 4 MG/2ML IJ SOLN: 4.0000 mg | Freq: Four times a day (QID) | INTRAMUSCULAR | Status: DC | PRN

## 2016-10-14 MED ORDER — ENOXAPARIN SODIUM 150 MG/ML ~~LOC~~ SOLN: 1.0000 mg/kg | Freq: Two times a day (BID) | SUBCUTANEOUS | Status: DC

## 2016-10-14 MED ORDER — ADULT MULTIVITAMIN W/MINERALS CH
1.0000 | ORAL_TABLET | Freq: Every day | ORAL | Status: DC
  Administered 2016-10-15 – 2016-10-22 (×8): 1 via ORAL
  Filled 2016-10-14 (×8): qty 1

## 2016-10-14 MED ORDER — ATORVASTATIN CALCIUM 80 MG PO TABS
80.0000 mg | ORAL_TABLET | Freq: Every day | ORAL | Status: DC
  Administered 2016-10-15 – 2016-10-21 (×7): 80 mg via ORAL
  Filled 2016-10-14 (×8): qty 1

## 2016-10-14 MED ORDER — ENSURE ENLIVE PO LIQD: 237.0000 mL | Freq: Two times a day (BID) | ORAL | Status: DC

## 2016-10-14 MED ORDER — ENOXAPARIN SODIUM 60 MG/0.6ML ~~LOC~~ SOLN
1.0000 mg/kg | Freq: Two times a day (BID) | SUBCUTANEOUS | Status: DC
  Administered 2016-10-14 – 2016-10-20 (×11): 55 mg via SUBCUTANEOUS
  Filled 2016-10-14 (×11): qty 0.6

## 2016-10-14 MED ORDER — FERROUS SULFATE 325 (65 FE) MG PO TABS: 325.0000 mg | ORAL_TABLET | Freq: Two times a day (BID) | ORAL | 0 refills | Status: DC

## 2016-10-14 MED ORDER — FERROUS SULFATE 325 (65 FE) MG PO TABS
325.0000 mg | ORAL_TABLET | Freq: Two times a day (BID) | ORAL | Status: DC
  Administered 2016-10-15 – 2016-10-22 (×15): 325 mg via ORAL
  Filled 2016-10-14 (×16): qty 1

## 2016-10-14 MED ORDER — ONDANSETRON HCL 4 MG PO TABS
4.0000 mg | ORAL_TABLET | Freq: Four times a day (QID) | ORAL | Status: DC | PRN
  Filled 2016-10-14: qty 1

## 2016-10-14 MED ORDER — NICOTINE 21 MG/24HR TD PT24: 21.0000 mg | MEDICATED_PATCH | Freq: Every day | TRANSDERMAL | 0 refills | Status: DC

## 2016-10-14 MED ORDER — ACETAMINOPHEN 325 MG PO TABS
650.0000 mg | ORAL_TABLET | ORAL | Status: DC | PRN
  Administered 2016-10-14 – 2016-10-22 (×19): 650 mg via ORAL
  Filled 2016-10-14 (×19): qty 2

## 2016-10-14 MED ORDER — WARFARIN SODIUM 5 MG PO TABS
5.0000 mg | ORAL_TABLET | Freq: Once | ORAL | Status: AC
  Administered 2016-10-14: 5 mg via ORAL
  Filled 2016-10-14: qty 1

## 2016-10-14 MED ORDER — SORBITOL 70 % SOLN: 30.0000 mL | Freq: Every day | Status: DC | PRN

## 2016-10-14 MED ORDER — FOLIC ACID 1 MG PO TABS
1.0000 mg | ORAL_TABLET | Freq: Every day | ORAL | Status: DC
  Administered 2016-10-15 – 2016-10-22 (×8): 1 mg via ORAL
  Filled 2016-10-14 (×8): qty 1

## 2016-10-14 NOTE — Progress Notes (Signed)
Rehab admissions - I met with patient.  She does want to admit to acute inpatient rehab.  Bed available and will admit to acute inpatient rehab today.  Call me for questions.  #[phone]

## 2016-10-14 NOTE — Discharge Summary (Signed)
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 tiny subcentimeter hypodensities throughout both lobes of the liver are too small to characterize, may be small cysts, hemangiomas or hammertoe mass. No suspicious focal lesion. Gallbladder minimally distended, no calcified stone. No biliary dilatation. Pancreas: No evidence of focal lesion. No ductal dilatation or inflammation. Spleen: Normal in size without focal abnormality. Adrenals/Urinary Tract: 13 mm left adrenal nodule. Right adrenal gland is normal. No hydronephrosis or perinephric edema. No focal renal mass. Symmetric enhancement and excretion on delayed phase imaging. Urinary bladder is physiologically distended without wall thickening or focal abnormality. Stomach/Bowel: Stomach distended with ingested contents. No evidence of gastric wall thickening. No small bowel dilatation, wall thickening or inflammation. Appendix tentatively but not definitively identified. Moderate stool in the proximal colon. No colonic wall thickening. No CT findings of colonic mass. Mild sigmoid colonic tortuosity.  Vascular/Lymphatic: Atherosclerosis of the abdominal aorta and its branches. Calcified noncalcified irregular plaque. No aneurysm. An incidental note of circumaortic left renal vein. No mesenteric, retroperitoneal or upper abdominal adenopathy. No pelvic adenopathy. Reproductive: Uterus and bilateral adnexa are unremarkable. Other: No free air free fluid. Small fat containing lower ventral abdominal wall hernia, likely at site of prior Caesarean section, no bowel involvement. No free air, free fluid, or intra-abdominal fluid collection. Musculoskeletal: There are no acute or suspicious osseous abnormalities. No blastic or destructive lytic lesions. Mild degenerative change in the spine and both hips. IMPRESSION: 1. No findings to explain unintentional weight loss. 2. Left adrenal nodule measures 13 mm. This is nonspecific. Recommend 12 month follow-up adrenal CT. 3. Mild emphysema. Small poorly defined nodules throughout both lungs, which may be inflammatory or secondary to smoking related lung disease. There is a ground-glass nodule in the right lower lobe measuring 8 mm, no definitive soft tissue component. Initial follow-up with CT at 6-12 months is recommended to confirm persistence. If persistent, repeat CT is recommended every 2 years until 5 years of stability has been established. This recommendation follows the consensus statement: Guidelines for Management of Incidental Pulmonary Nodules Detected on CT Images: From the Fleischner Society 2017; Radiology 2017; 284:228-243. 4. No convincing findings of malignancy. 5. Thoracoabdominal aortic atherosclerosis. 6. Small fat containing lower ventral abdominal wall hernia. Electronically Signed   By: Jeb Levering M.D.   On: 10/12/2016 23:48   Dg Chest Port 1 View  Result Date: 10/11/2016 CLINICAL DATA:  71 year old female with weakness and confusion. Subsequent encounter. EXAM: PORTABLE CHEST 1 VIEW COMPARISON:  10/10/2016 and 01/31/2015 chest x-ray.  FINDINGS: No infiltrate, congestive heart failure or pneumothorax. No plain film evidence of pulmonary malignancy. Heart size top-normal. Tortuous aorta which is partially calcified. No acute osseous abnormality. Acromioclavicular joint degenerative changes. IMPRESSION: No active disease. Electronically Signed   By: Genia Del M.D.   On: 10/11/2016 11:44   Ct Head Code Stroke W/o Cm  Result Date: 10/10/2016 CLINICAL DATA:  Code stroke.  Left facial droop and slurred speech EXAM: CT HEAD WITHOUT CONTRAST TECHNIQUE: Contiguous  axial images were obtained from the base of the skull through the vertex without intravenous contrast. COMPARISON:  None. FINDINGS: Brain: No evidence of acute infarction, hemorrhage, hydrocephalus, extra-axial collection or mass lesion/mass effect. Vascular: No hyperdense vessel or unexpected calcification. Skull: Negative Sinuses/Orbits: Mild mucosal edema paranasal sinuses.  Normal orbit. Other: None ASPECTS ([place] Stroke Program Early CT Score) - Ganglionic level infarction (caudate, lentiform nuclei, internal capsule, insula, M1-M3 cortex): 7 - Supraganglionic infarction (M4-M6 cortex): 3 Total score (0-10 with 10 being normal): 10 IMPRESSION: 1. Negative CT head 2. ASPECTS is 10 These results were called by telephone at the time of interpretation on 10/10/2016 at 10:56 am to Dr. Shon Hale, who verbally acknowledged these results. Electronically Signed   By: Franchot Gallo M.D.   On: 10/10/2016 10:57    Subjective: Eager to go to rehab  Discharge Exam: Vitals:   10/14/16 0500 10/14/16 0921  BP: (!) 143/62 130/76  Pulse: 70 80  Resp: 20 20  Temp: 99.1 F (37.3 C) 98.6 F (37 C)   Vitals:   10/13/16 2057 10/14/16 0120 10/14/16 0500 10/14/16 0921  BP: 135/75 130/72 (!) 143/62 130/76  Pulse: 91 89 70 80  Resp: 20 20 20 20   Temp: 98.5 F (36.9 C) 98.2 F (36.8 C) 99.1 F (37.3 C) 98.6 F (37 C)  TempSrc: Oral Oral Oral Oral  SpO2: 100% 100% 100% 99%  Weight:       Height:        General: Pt is alert, awake, not in acute distress Cardiovascular: RRR, S1/S2 +, no rubs, no gallops Respiratory: CTA bilaterally, no wheezing, no rhonchi Abdominal: Soft, NT, ND, bowel sounds + Extremities: no edema, no cyanosis   The results of significant diagnostics from this hospitalization (including imaging, microbiology, ancillary and laboratory) are listed below for reference.     Microbiology: Recent Results (from the past 240 hour(s))  MRSA PCR Screening     Status: None   Collection Time: 10/11/16  8:22 PM  Result Value Ref Range Status   MRSA by PCR NEGATIVE NEGATIVE Final    Comment:        The GeneXpert MRSA Assay (FDA approved for NASAL specimens only), is one component of a comprehensive MRSA colonization surveillance program. It is not intended to diagnose MRSA infection nor to guide or monitor treatment for MRSA infections.      Labs: BNP (last 3 results) No results for input(s): BNP in the last 8760 hours. Basic Metabolic Panel:  Recent Labs Lab 10/10/16 1035 10/10/16 1042 10/11/16 0921 10/11/16 1117 10/12/16 0547 10/13/16 0426 10/14/16 0402  NA 140 142 141  --  143 139 140  K 3.2* 3.1* 2.6*  --  3.1* 4.1 3.7  CL 107 108 106  --  108 109 107  CO2 25  --  23  --  26 23 24   GLUCOSE 107* 102* 167*  --  102* 107* 124*  BUN 8 9 13   --  7 13 17   CREATININE 0.76 0.70 0.84  --  0.71 0.83 0.83  CALCIUM 10.5*  --  10.1  --  9.8 9.9 10.3  MG  --   --   --  2.2  --   --   --    Liver Function Tests:  Recent Labs Lab 10/10/16 1035 10/11/16 0921 10/12/16 0547  AST 20 19 16   ALT 18 16 14   ALKPHOS 110 106 100  BILITOT 0.3 0.6 0.6  PROT 7.3 7.3 7.1  ALBUMIN  3.5 3.6 3.2*   No results for input(s): LIPASE, AMYLASE in the last 168 hours.  Recent Labs Lab 10/11/16 1537  AMMONIA 27   CBC:  Recent Labs Lab 10/10/16 1035 10/10/16 1042 10/11/16 0921 10/12/16 0547 10/13/16 0426 10/14/16 0402  WBC 9.5  --  19.1* 8.0 6.6  6.9  NEUTROABS 6.8  --   --  5.8  --   --   HGB 12.0 12.6 11.7* 11.2* 10.7* 11.1*  HCT 36.5 37.0 36.4 34.6* 33.5* 35.2*  MCV 93.4  --  94.1 93.8 93.8 94.6  PLT 234  --  293 227 219 220   Cardiac Enzymes: No results for input(s): CKTOTAL, CKMB, CKMBINDEX, TROPONINI in the last 168 hours. BNP: Invalid input(s): POCBNP CBG: No results for input(s): GLUCAP in the last 168 hours. D-Dimer No results for input(s): DDIMER in the last 72 hours. Hgb A1c  Recent Labs  10/12/16 0547  HGBA1C 5.4   Lipid Profile  Recent Labs  10/12/16 0547  CHOL 190  HDL 42  LDLCALC 129*  TRIG 97  CHOLHDL 4.5   Thyroid function studies  Recent Labs  10/11/16 1537  TSH 0.451   Anemia work up  Recent Labs  10/11/16 1537  VITAMINB12 351  FOLATE 19.5  FERRITIN 70  TIBC 302  IRON 21*  RETICCTPCT 1.5   Urinalysis    Component Value Date/Time   COLORURINE YELLOW 10/11/2016 1125   APPEARANCEUR CLEAR 10/11/2016 1125   LABSPEC 1.015 10/11/2016 1125   PHURINE 6.0 10/11/2016 1125   GLUCOSEU 50 (A) 10/11/2016 1125   HGBUR SMALL (A) 10/11/2016 1125   BILIRUBINUR NEGATIVE 10/11/2016 1125   KETONESUR 20 (A) 10/11/2016 1125   PROTEINUR 30 (A) 10/11/2016 1125   UROBILINOGEN 0.2 07/17/2015 1334   NITRITE NEGATIVE 10/11/2016 1125   LEUKOCYTESUR NEGATIVE 10/11/2016 1125   Sepsis Labs Invalid input(s): PROCALCITONIN,  WBC,  LACTICIDVEN Microbiology Recent Results (from the past 240 hour(s))  MRSA PCR Screening     Status: None   Collection Time: 10/11/16  8:22 PM  Result Value Ref Range Status   MRSA by PCR NEGATIVE NEGATIVE Final    Comment:        The GeneXpert MRSA Assay (FDA approved for NASAL specimens only), is one component of a comprehensive MRSA colonization surveillance program. It is not intended to diagnose MRSA infection nor to guide or monitor treatment for MRSA infections.      SIGNED:   Donne Hazel, MD  [facility] 10/14/2016, 12:26 PM  If 7PM-7AM,  please contact night-coverage [email or web address] Password [email or web address]

## 2016-10-14 NOTE — Progress Notes (Signed)
[place] for lovenox, warfarin Indication: Basilar artery thrombosis  Allergies  Allergen Reactions  . Tramadol     Per pt, she got depressed, moody, and had increased back pain when she took tramadol    Patient Measurements: Height: 5\' 5"  (165.1 cm) Weight: 123 lb 7.3 oz (56 kg) IBW/kg (Calculated) : 57  Vital Signs: Temp: 98.6 F (37 C) (02/01 0921) Temp Source: Oral (02/01 0921) BP: 130/76 (02/01 0921) Pulse Rate: 80 (02/01 0921)  Labs:  Recent Labs  10/12/16 0547 10/12/16 1219 10/13/16 0426 10/14/16 0402  HGB 11.2*  --  10.7* 11.1*  HCT 34.6*  --  33.5* 35.2*  PLT 227  --  219 220  LABPROT  --   --   --  13.1  INR  --   --   --  0.99  HEPARINUNFRC 0.28* 0.34 0.35  --   CREATININE 0.71  --  0.83 0.83    Estimated Creatinine Clearance (by C-G formula based on SCr of 0.83 mg/dL) Female: 57.3 mL/min Female: 67.5 mL/min   Assessment: 5 yom presented to the hospital with AMS 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 INR is subtherapeutic today as anticipated at 0.99. H/H is slightly low but stable and platelets are WNL. No bleeding noted.   Goal of Therapy:  INR 2-3 Monitor platelets by anticoagulation protocol: Yes   Plan:  Continue lovenox 1mg /kg subcutaneously q12h Repeat Warfarin 5mg  tonight x1 Daily INR/CBC Monitor s/sx of bleeding  Salome Arnt, PharmD, BCPS Pager # [phone] 10/14/2016 10:10 AM

## 2016-10-14 NOTE — Care Management Note (Signed)
Case Management Note  Patient Details  Name: Mercedes Gardner MRN: [id number] Date of Birth: 04-05-1946  Subjective/Objective:                    Action/Plan: Pt discharging to CIR today. No further needs per CM.   Expected Discharge Date:  10/14/16               Expected Discharge Plan:  [place]  In-House Referral:     Discharge planning Services  CM Consult  Post Acute Care Choice:    Choice offered to:     DME Arranged:    DME Agency:     HH Arranged:    HH Agency:     Status of Service:  Completed, signed off  If discussed at [employer] [facility] [employer], dates discussed:    Additional Comments:  Pollie Friar, RN 10/14/2016, 1:49 PM

## 2016-10-14 NOTE — H&P (Signed)
Physical Medicine and Rehabilitation Admission H&P     Chief Complaint  Patient presents with  . Weakness  :  HPI: Mercedes Gardner is a 71 y.o. right handed female with history of hypertension, hyperlipidemia and tobacco abuse. Presented 10/10/2016 with waxing and waning symptoms of intermittent slurred speech and left-sided weakness.Noted 20 pound weight loss since September and poor eating habits. Per chart review patient lives with son independent prior to admission. Works as a Print production planner. Son works from home and can assist. MRI of the brain showed acute early subacute infarction within the right hemi-pons and multiple additional small foci in the right cerebellar hemisphere, left pontomedullary junction, right occipital lobe and right paramedian parietal lobe.WBC initially elevated 19,100 felt to be reactive improved to 6.9. 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 Lower extremity Dopplers negative for DVT. Carotid Doppler showed no ICA stenosis. Patient did not receive TPA. Echocardiogram with ejection fraction of 65% and grade 2 diastolic dysfunction. No regional wall motion abnormalities . CT chest abdomen pelvis completed for history of recent weight loss showing no evidence of malignancy. There was some incidental findings of pulmonary nodules recommend CT repeat in 6-12 months. 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 Advised need to repeat CTA of head and neck in 2-3 months to decide duration of anticoagulation use. Patient currently completing a course of Augmentin for suspect sinusitis and chest x-ray was completed that was negative. Bouts of hypokalemia 2.6 corrected with potassium supplement. Physical and occupational therapy evaluation completed with recommendations of physical medicine rehabilitation consult.Patient was admitted for a comprehensive rehabilitation  program  Review of Systems  Constitutional: Positive for weight loss.  HENT: Negative for hearing loss and tinnitus.  Eyes: Negative for blurred vision and double vision.  Respiratory: Positive for cough and shortness of breath.  Cardiovascular: Positive for palpitations and leg swelling.  Gastrointestinal: Positive for constipation. Negative for nausea and vomiting.  GERD  Musculoskeletal: Positive for joint pain and myalgias.  Skin: Negative for rash.  Neurological: Positive for sensory change, speech change and weakness.  Psychiatric/Behavioral:  Anxiety  All other systems reviewed and are negative.       Past Medical History:  Diagnosis Date  . Anemia   . Anxiety   . Arthritis    knees   . Dysrhythmia    heart skips a beat   . GERD (gastroesophageal reflux disease)   . Gout   . Hyperlipidemia April 2014  . Hypertension   . Shortness of breath dyspnea    due to pressure of cyst per patient         Past Surgical History:  Procedure Laterality Date  . CESAREAN SECTION     x3  . OVARIAN CYST SURGERY Right 1980's  . SALPINGOOPHORECTOMY Bilateral 02/06/2015   Procedure: Campbell Stall LAPAROTOMY/BILATERAL SALPINGO OOPHORECTOMY; Surgeon: Everitt Amber, MD; Location: [facility]; Service: Gynecology; Laterality: Bilateral;        Family History  Problem Relation Age of Onset  . Cancer Mother   . Hypertension Mother   . Diabetes Mother   . Diabetes Sister   . Hypertension Sister    Social History: reports that she has been smoking Cigarettes. She has been smoking about 0.02 packs per day. She has never used smokeless tobacco. She reports that she does not drink alcohol or use drugs.  Allergies:       Allergies  Allergen Reactions  .  Tramadol     Per pt, she got depressed, moody, and had increased back pain when she took tramadol         Medications Prior to Admission  Medication Sig Dispense Refill  . acetaminophen (TYLENOL) 500 MG tablet Take 2 tablets (1,000 mg total)  by mouth every 6 (six) hours. 30 tablet 0  . acetaminophen-codeine (TYLENOL #3) 300-30 MG per tablet   0  . acetaminophen-codeine (TYLENOL #3) 300-30 MG tablet Take 1 tablet by mouth every 4 (four) hours as needed for moderate pain.    [name] Kitchen amLODipine (NORVASC) 10 MG tablet Take 10 mg by mouth every morning.     [name] Kitchen amoxicillin-clavulanate (AUGMENTIN) 875-125 MG tablet Take 1 tablet by mouth 2 (two) times daily. 14 tablet 0  . busPIRone (BUSPAR) 10 MG tablet Take 10 mg by mouth 2 (two) times daily.     . busPIRone (BUSPAR) 5 MG tablet Take 5 mg by mouth 2 (two) times daily as needed.    . clobetasol ointment (TEMOVATE) [id number] % Apply 1 application topically 2 (two) times daily. 60 g 0  . colchicine (COLCRYS) 0.6 MG tablet Take 0.6 mg by mouth 2 (two) times daily.    . colchicine 0.6 MG tablet Take 1 tablet (0.6 mg total) by mouth daily. 30 tablet 0  . diclofenac sodium (VOLTAREN) 1 % GEL Apply 4 g topically 4 (four) times daily. To both knees, please instruct in dosing. (Patient taking differently: Apply 4 g topically 4 (four) times daily as needed (PAIN APPLIES TO BOTH KNEES). ) 3 Tube 0  . fexofenadine-pseudoephedrine (ALLEGRA-D) 60-120 MG per tablet Take 1 tablet by mouth 2 (two) times daily as needed (ALLERGIES).    . fluticasone (FLONASE) 50 MCG/ACT nasal spray Place 2 sprays into both nostrils 2 (two) times daily.     . Gauze Pads & Dressings (GAUZE DRESSING) 4"X4" PADS 1 packet by Does not apply route 2 (two) times daily. 60 each 1  . Gauze Pads & Dressings (GAUZE DRESSING) 4"X4" PADS 1 Device by Does not apply route 2 (two) times daily. 40 each 0  . ibuprofen (ADVIL,MOTRIN) 800 MG tablet Take 1 tablet (800 mg total) by mouth 3 (three) times daily. 30 tablet 0  . loratadine (CLARITIN) 10 MG tablet Take 10 mg by mouth every morning.     [name] Kitchen omeprazole (PRILOSEC) 20 MG capsule Take 20 mg by mouth every morning.   0  . OVER THE COUNTER MEDICATION Place 2 drops into both eyes daily as needed (DRY EYES,  RED EYES).    [name] Kitchen oxyCODONE (OXY IR/ROXICODONE) 5 MG immediate release tablet Take 1 tablet (5 mg total) by mouth every 4 (four) hours as needed for severe pain. Do not take and drive 30 tablet 0  . fluconazole (DIFLUCAN) 100 MG tablet Take 1 tablet (100 mg total) by mouth daily. (Patient not taking: Reported on 10/11/2016) 10 tablet 0  . indomethacin (INDOCIN SR) 75 MG CR capsule Take 1 capsule (75 mg total) by mouth 2 (two) times daily with a meal. 60 capsule 0  . indomethacin (INDOCIN) 50 MG capsule Take 50 mg by mouth 3 (three) times daily as needed for mild pain.   0  . ipratropium (ATROVENT) 0.06 % nasal spray Place 2 sprays into both nostrils 4 (four) times daily. 15 mL 1  . magnesium hydroxide (MILK OF MAGNESIA) 400 MG/5ML suspension Take 30 mLs by mouth every 8 (eight) hours. 360 mL 0  . meloxicam (MOBIC) 7.5 MG tablet  Take 1 tablet (7.5 mg total) by mouth daily. 10 tablet 0  . Misc. Devices (CANE) MISC 1 Device by Does not apply route once. (Patient not taking: Reported on 10/11/2016) 1 each 0  . neomycin-bacitracin-polymyxin (NEOSPORIN) ointment Apply 1 application topically every 12 (twelve) hours. apply to abdomnal wound (Patient not taking: Reported on 10/11/2016) 15 g 0  . NICORETTE 4 MG gum Take 1 each (4 mg total) by mouth 3 (three) times daily. (Patient not taking: Reported on 10/11/2016) 100 tablet 0  . predniSONE (DELTASONE) 10 MG tablet Take 6 tablets on day 1, 5 on day 2, 4 on day 3, 3 on day 4, 2 on day 5, and 1 on day 6. (Patient not taking: Reported on 10/11/2016) 21 tablet 0   Home:  Home Living  Family/patient expects to be discharged to:: Inpatient rehab  Living Arrangements: Children (with son)  Available Help at Discharge: Family  Type of Home: House  Home Access: Stairs to enter  Technical brewer of Steps: 3  Home Layout: One level  Home Equipment: Crutches  Lives With: Son  Functional History:  Prior Function  Level of Independence: Independent  Comments:  Works as Print production planner.  Functional Status:  Mobility:  Bed Mobility  Overal bed mobility: Needs Assistance  Bed Mobility: Supine to Sit, Sit to Supine  Supine to sit: Min guard, HOB elevated  Sit to supine: Min guard  General bed mobility comments: pt needs increased time, but is able to complete without A.  Transfers  Overall transfer level: Needs assistance  Equipment used: 1 person hand held assist  Transfers: Sit to/from Stand  Sit to Stand: Min assist  General transfer comment: cues for positioning of Bil feet and technique. pt tends to want to pull on PT for support, but needs cues for proper UE use. pt unsteady and leans to R side.  Ambulation/Gait  Ambulation/Gait assistance: Mod assist  Ambulation Distance (Feet): 50 Feet  Assistive device: 1 person hand held assist  Gait Pattern/deviations: Step-through pattern, Decreased stride length, Decreased dorsiflexion - left, Scissoring, Ataxic  General Gait Details: pt with some ataxia and decreased proprioception. pt at times stepping on her own foot without realization. pt's L knee weak and with instability in stance.   ADL:   Cognition:  Cognition  Overall Cognitive Status: Impaired/Different from baseline  Orientation Level: Oriented X4  Cognition  Arousal/Alertness: Awake/alert  Behavior During Therapy: Impulsive  Overall Cognitive Status: Impaired/Different from baseline  Area of Impairment: Attention, Problem solving, Safety/judgement, Awareness  Current Attention Level: Alternating  Safety/Judgement: Decreased awareness of deficits, Decreased awareness of safety  Awareness: Anticipatory  Problem Solving: Difficulty sequencing, Requires verbal cues  Physical Exam:  Blood pressure 132/71, pulse 83, temperature 98.4 F (36.9 C), temperature source Oral, resp. rate (!) 21, height 5\' 5"  (1.651 m), weight 56 kg (123 lb 7.3 oz), SpO2 97 %.  Physical Exam  HENT:  Left facial droop  Eyes: EOM are normal. Left eye  exhibits no discharge.  Neck: Normal range of motion. Neck supple. No thyromegaly present.  Cardiovascular: Normal rate, regular rhythm and normal heart sounds.  Respiratory: Effort normal and breath sounds normal. No respiratory distress. She has no wheezes.  GI: Soft. Bowel sounds are normal. She exhibits no distension.  Skin.Warm and dry  Neurological: She is alert.  Anxious. Follows simple commands. Provides name and age as well as date of birth. Speech is mildly dysarthric but intelligible. Fair awareness of deficits. Left facial weakness. RUE  and RLE 5/5. LUE 3- Delt, Bi, tri grip  LLE Quad,4- ADF, APF3- /5. No sensory deficits  Lab Results Last 48 Hours  Imaging Results (Last 48 hours)     Medical Problem List and Plan:  1. 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 DVT Prophylaxis/Anticoagulation: Coumadin therapy with Lovenox for bridging. Monitor for any signs of bleeding. INR goal 2-3. Plan repeat CTA of head and neck 2-3 months to decide duration of anticoagulation use  3. Pain Management: Tylenol as needed  4. Mood: Provide emotional support  5. Neuropsych: This patient is capable of making decisions on her own behalf.  6. Skin/Wound Care: Routine skin checks  7. Fluids/Electrolytes/Nutrition: Routine I&O with follow-up chemistries  8. Hypertension. Permissive hypertension. Patient on Norvasc 10 mg daily prior to admission. Resume as needed  9. Tobacco abuse. Nicoderm patch. Provide counseling  10. Sinusitis. Complete course of Augmentin.  11. Hyperlipidemia. Lipitor  12. Decreased nutritional storage. TSH 0.451. Dietary supplements. Dietary consult  13. Leukocytosis. Resolved. Follow-up CBC  14. Hypokalemia. Follow-up chemistries  15. Normocytic anemia. Continue iron supplementation  16. History of gout. Monitor for any signs of flareup  17. Constipation. Laxative assistance  Post Admission Physician Evaluation:  1. Functional deficits secondary to Left hemiparesis. 2. Patient is admitted to receive collaborative, interdisciplinary care between the physiatrist, rehab nursing staff, and therapy team. 3. Patient's level of medical complexity and substantial therapy needs in context of that medical necessity cannot be provided at a lesser intensity of care such as a SNF. 4. Patient has experienced substantial functional loss from his/her baseline which was documented above under the "Functional History" and "Functional Status" headings. Judging by the patient's diagnosis, physical exam, and functional history, the patient has potential for functional progress which will result in measurable gains while on inpatient rehab. These gains will be of substantial and  practical use upon discharge in facilitating mobility and self-care at the household level. 5. Physiatrist will provide 24 hour management of medical needs as well as oversight of the therapy plan/treatment and provide guidance as appropriate regarding the interaction of the two. 6. The Preadmission Screening has been reviewed and patient status is unchanged unless otherwise stated above. 7. 24 hour rehab nursing will assist with bladder management, bowel management, safety, skin/wound care, disease management, medication administration, pain management and patient education and help integrate therapy concepts, techniques,education, etc. 8. PT will assess and treat for/with: pre gait, gait training, endurance , safety, equipment, neuromuscular re education. Goals are: Sup/min A. 9. OT will assess and treat for/with: ADLs, Cognitive perceptual skills, Neuromuscular re education, safety, endurance, equipment. Goals are: ADLs, Cognitive perceptual skills, Neuromuscular re education, safety, endurance, equipment. Therapy may proceed with showering this patient. 10. SLP will assess and treat for/with: dysarthria, swallow assessment, cognitive assessment. Goals are: Mod I spech intell, Mod I Med management. 11. Case Management and Social Worker will assess and treat for psychological issues and discharge planning. 12. Team conference will be held weekly to assess progress toward goals and to determine barriers to discharge. 13. Patient will receive at least 3 hours of therapy per day at least 5 days per week. 14. ELOS: 14-17d  15. Prognosis: good Charlett Blake M.D. [facility] FAAPM&R (Sports Med, Neuromuscular Med) Diplomate Am Board of Electrodiagnostic Med   Cathlyn Parsons., PA-C  10/13/2016

## 2016-10-15 ENCOUNTER — Inpatient Hospital Stay (HOSPITAL_COMMUNITY): Payer: Medicare Other | Admitting: Speech Pathology

## 2016-10-15 ENCOUNTER — Other Ambulatory Visit: Payer: Self-pay | Admitting: Neurology

## 2016-10-15 ENCOUNTER — Inpatient Hospital Stay (HOSPITAL_COMMUNITY): Payer: Medicare Other | Admitting: Physical Therapy

## 2016-10-15 ENCOUNTER — Inpatient Hospital Stay (HOSPITAL_COMMUNITY): Payer: Medicare Other | Admitting: Occupational Therapy

## 2016-10-15 DIAGNOSIS — I69398 Other sequelae of cerebral infarction: Secondary | ICD-10-CM

## 2016-10-15 DIAGNOSIS — I639 Cerebral infarction, unspecified: Secondary | ICD-10-CM

## 2016-10-15 DIAGNOSIS — R269 Unspecified abnormalities of gait and mobility: Secondary | ICD-10-CM

## 2016-10-15 LAB — COMPREHENSIVE METABOLIC PANEL
ALBUMIN: 3.3 g/dL — AB (ref 3.5–5.0)
ALK PHOS: 93 U/L (ref 38–126)
ALT: 32 U/L (ref 14–54)
AST: 38 U/L (ref 15–41)
Anion gap: 9 (ref 5–15)
BUN: 15 mg/dL (ref 6–20)
CALCIUM: 10.1 mg/dL (ref 8.9–10.3)
CO2: 26 mmol/L (ref 22–32)
CREATININE: 0.81 mg/dL (ref 0.44–1.00)
Chloride: 104 mmol/L (ref 101–111)
GFR calc Af Amer: >60 mL/min (ref >60)
GFR calc non Af Amer: >60 mL/min (ref >60)
GLUCOSE: 170 mg/dL — AB (ref 65–99)
Potassium: 3.5 mmol/L (ref 3.5–5.1)
SODIUM: 139 mmol/L (ref 135–145)
Total Bilirubin: 0.4 mg/dL (ref 0.3–1.2)
Total Protein: 6.7 g/dL (ref 6.5–8.1)

## 2016-10-15 LAB — CBC WITH DIFFERENTIAL/PLATELET
BASOS PCT: 0 %
Basophils Absolute: 0 10*3/uL (ref 0.0–0.1)
EOS ABS: 0.2 10*3/uL (ref 0.0–0.7)
Eosinophils Relative: 2 %
HCT: 36.1 % (ref 36.0–46.0)
HEMOGLOBIN: 11.4 g/dL — AB (ref 12.0–15.0)
LYMPHS ABS: 1.8 10*3/uL (ref 0.7–4.0)
Lymphocytes Relative: 22 %
MCH: 29.8 pg (ref 26.0–34.0)
MCHC: 31.6 g/dL (ref 30.0–36.0)
MCV: 94.5 fL (ref 78.0–100.0)
Monocytes Absolute: 0.3 10*3/uL (ref 0.1–1.0)
Monocytes Relative: 3 %
NEUTROS PCT: 73 %
Neutro Abs: 6.3 10*3/uL (ref 1.7–7.7)
Platelets: 224 10*3/uL (ref 150–400)
RBC: 3.82 MIL/uL — AB (ref 3.87–5.11)
RDW: 13.2 % (ref 11.5–15.5)
WBC: 8.5 10*3/uL (ref 4.0–10.5)

## 2016-10-15 LAB — PROTIME-INR
INR: 1.06
Prothrombin Time: 13.9 seconds (ref 11.4–15.2)

## 2016-10-15 MED ORDER — DICLOFENAC SODIUM 1 % TD GEL
2.0000 g | Freq: Four times a day (QID) | TRANSDERMAL | Status: DC
  Administered 2016-10-15 – 2016-10-22 (×21): 2 g via TOPICAL
  Filled 2016-10-15: qty 100

## 2016-10-15 MED ORDER — PRO-STAT SUGAR FREE PO LIQD
30.0000 mL | Freq: Two times a day (BID) | ORAL | Status: DC
  Administered 2016-10-15 – 2016-10-22 (×15): 30 mL via ORAL
  Filled 2016-10-15 (×15): qty 30

## 2016-10-15 MED ORDER — BUSPIRONE HCL 5 MG PO TABS
5.0000 mg | ORAL_TABLET | Freq: Two times a day (BID) | ORAL | Status: DC
  Administered 2016-10-15 – 2016-10-16 (×3): 5 mg via ORAL
  Filled 2016-10-15 (×3): qty 1

## 2016-10-15 MED ORDER — PANTOPRAZOLE SODIUM 40 MG PO TBEC
40.0000 mg | DELAYED_RELEASE_TABLET | Freq: Every day | ORAL | Status: DC
  Administered 2016-10-15 – 2016-10-22 (×8): 40 mg via ORAL
  Filled 2016-10-15 (×8): qty 1

## 2016-10-15 MED ORDER — WARFARIN SODIUM 5 MG PO TABS
5.0000 mg | ORAL_TABLET | Freq: Once | ORAL | Status: AC
  Administered 2016-10-15: 5 mg via ORAL
  Filled 2016-10-15: qty 1

## 2016-10-15 NOTE — Plan of Care (Signed)
Problem: RH SAFETY Goal: RH STG ADHERE TO SAFETY PRECAUTIONS W/ASSISTANCE/DEVICE STG Adhere to Safety Precautions With Assistance/Device. Supervision  Use of bed alarm, reminders to wash hands, good hygiene, review and reinforce use of call light.

## 2016-10-15 NOTE — Progress Notes (Signed)
Social Work Assessment and Plan Social Work Assessment and Plan  Patient Details  Name: Mercedes Gardner MRN: [id number] Date of Birth: 12-03-1945  Today's Date: 10/15/2016  Problem List:  Patient Active Problem List   Diagnosis Date Noted  . Hemiparesis affecting left side as late effect of stroke ([place]) 10/14/2016  . Gait disturbance, post-stroke 10/14/2016  . Dysarthria   . Left-sided weakness   . Lung nodule   . Unintentional weight loss   . Cerebral infarction due to thrombosis of basilar artery ([place])   . Stroke-like symptoms 10/11/2016  . FTT (failure to thrive) in adult 10/11/2016  . Leukocytosis 10/11/2016  . Acute hypokalemia 10/11/2016  . Normocytic anemia 10/11/2016  . Cerebral thrombosis with cerebral infarction 10/11/2016  . Acute CVA (cerebrovascular accident) ([place]) 10/11/2016  . 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 Ischemic stroke ([place])   . Anxiety   . Hypokalemia   . Pelvic mass in female 12/26/2014  . Gout 12/24/2008  . ALLERGIC RHINITIS 12/20/2008  . KNEE PAIN, LEFT 12/20/2008  . TOBACCO ABUSE 07/29/2006  . HTN (hypertension) 07/29/2006  . HEMORRHOIDS 07/29/2006   Past Medical History:  Past Medical History:  Diagnosis Date  . Anemia   . Anxiety   . Arthritis    knees   . Dysrhythmia    heart skips a beat   . GERD (gastroesophageal reflux disease)   . Gout   . Hyperlipidemia April 2014  . Hypertension   . Shortness of breath dyspnea    due to pressure of cyst per patient    Past Surgical History:  Past Surgical History:  Procedure Laterality Date  . CESAREAN SECTION     x3  . OVARIAN CYST SURGERY Right 1980's  . SALPINGOOPHORECTOMY Bilateral 02/06/2015   Procedure: Campbell Stall LAPAROTOMY/BILATERAL SALPINGO OOPHORECTOMY;  Surgeon: Everitt Amber, MD;  Location: [facility];  Service: Gynecology;  Laterality: Bilateral;   Social History:  reports that she has been smoking Cigarettes.  She has been smoking about 0.02 packs per day. She  has never used smokeless tobacco. She reports that she does not drink alcohol or use drugs.  Family / Support Systems Marital Status: Divorced Patient Roles: Parent, Other (Comment) (employee) Children: Kinnie Scales [phone]  April-daughter [phone] Other Supports: Conservation officer, nature Anticipated Caregiver: Son and patient Ability/Limitations of Caregiver: Son works from home and one daughter in [place] and another local Caregiver Availability: Other (Comment) (Awaiting goals and will come up with a plan) Family Dynamics: Close knit family many of them, sister reports they will help her and pull together. Pt wants to be independent like she always has been and is not one to ask for help from others, reason why she is in this condition. Sister here and talking with pt about changing her ways.  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   Abuse/Neglect Physical Abuse: Denies Verbal Abuse: Denies Sexual Abuse: Denies Exploitation of patient/patient's resources: Denies Self-Neglect: Denies  Emotional Status Pt's affect, behavior adn adjustment status: Pt is motivated and amazed how well she has done so far, she came in and couldn't talk or see. She is able to communicate and  is moving her arms and legs. She will do what she needs to do to recover due to does not want to be dependent upon others for her care. Recent Psychosocial Issues: health issues, grief issues and losses Pyschiatric History: History of anxiety takes medication but does not feel it works for her,  has asked MD for something else. Feel would benefit from seeing neuro-psych while here, has experienced multiple losses in a short period of time. Most significant the loss of her 69 yo niece to cancer in 2016. Will make referral. Substance Abuse History: Tobacco and ETOH aware needs to quit and plans too. Aware of the resources available to her. Will continue to discuss while here.  Patient / Family Perceptions, Expectations & Goals Pt/Family understanding of illness & functional limitations: Pt and sister can explain her strokes and deficits. SHehas made remarkable progress in a short time and both hope this will continue. They talk with the MD and feel they have a good understanding of her deficits and treatment plan. Premorbid pt/family roles/activities: Mom, employee, sibling, friend, Architectural technologist, church member, etc Anticipated changes in roles/activities/participation: resume Pt/family expectations/goals: Pt states: " I want to be able to take care of myself, before I leave here."  Sister states: " We will do what she needs but she is stubborn and won't want our help."  [employer]: None Premorbid Home Care/DME Agencies: None Transportation available at discharge: [employer] referrals recommended: Neuropsychology, Support group (specify)  Discharge Planning Living Arrangements: Children Support Systems: Children, Other relatives, Water engineer, Social worker community Type of Residence: Private residence Insurance Resources: Information systems manager, [employer] (specify county) Sports coach Co) Pensions consultant: Fish farm manager, Employment, Secondary school teacher Screen Referred: Previously completed Living Expenses: Education officer, community Management: Patient, Family Does the patient have any problems obtaining your medications?: No Home Management: She and son help one another Magazine features editor Plans: Return home with son who works from home and can assist her if needed. Pt  is hoping she will be independent before she leaves here. Team evaluating and setting goals today. Very supportive family that will assist her. Social Work Anticipated Follow Up Needs: HH/OP, Support Group  Clinical Impression Pleasant female who is thankful how well she is doing and hoping this will continue. She has a very supportive family who are willing to assist her. She realizes she needs top change some of her habits-ie smoking and drinking. She also needs to talk to a therapist regarding her losses instead of holding it in and not coping well with it. Will ask neuro-psych to see while here and also refer to OP therapy/Hospice Grief Counseling. Work on a safe discharge plan.  Mercedes Gardner 10/15/2016, 1:11 PM

## 2016-10-15 NOTE — Progress Notes (Signed)
Patient reporting high level of anxiety--will resume low dose Buspar.

## 2016-10-15 NOTE — Plan of Care (Signed)
Problem: RH PAIN MANAGEMENT Goal: RH STG PAIN MANAGED AT OR BELOW PT'S PAIN GOAL <3  Outcome: Progressing Use of pain meds ordered and use of ice/heat, repositioning, massage for pain management to acceptable levels.   Problem: RH KNOWLEDGE DEFICIT Goal: RH STG INCREASE KNOWLEDGE OF HYPERTENSION S/S of Hypertension  Outcome: Progressing Pt will be able to state 3 increased risks of uncontrolled hypertension Pt will be able to state ways pt's htn is being managed currently   Problem: RH BOWEL ELIMINATION Goal: RH STG MANAGE BOWEL WITH ASSISTANCE STG Manage Bowel with Assistance.  Outcome: Progressing Pt to stand pivot to wheelchair to [facility] or bathroom w/moderate assistance and will anticipate need to have BM and call for assistance.

## 2016-10-15 NOTE — Progress Notes (Signed)
STROKE TEAM PROGRESS NOTE   SUBJECTIVE (INTERVAL HISTORY) Friend is at bedside. Pt left sided weakness continues to improve. Asking for nicotin patch. Pending CIR.     OBJECTIVE Temp:  [97.9 F (36.6 C)-99.1 F (37.3 C)] 97.9 F (36.6 C) (02/01 1822) Pulse Rate:  [70-89] 86 (02/01 1822) Cardiac Rhythm: Normal sinus rhythm (02/01 0700) Resp:  [16-20] 18 (02/01 1822) BP: (130-143)/(62-80) 136/66 (02/01 1822) SpO2:  [99 %-100 %] 100 % (02/01 1822)  CBC:  Recent Labs Lab 10/10/16 1035  10/12/16 0547 10/13/16 0426 10/14/16 0402  WBC 9.5  < > 8.0 6.6 6.9  NEUTROABS 6.8  --  5.8  --   --   HGB 12.0  < > 11.2* 10.7* 11.1*  HCT 36.5  < > 34.6* 33.5* 35.2*  MCV 93.4  < > 93.8 93.8 94.6  PLT 234  < > 227 219 220  < > = values in this interval not displayed.  Basic Metabolic Panel:   Recent Labs Lab 10/11/16 1117  10/13/16 0426 10/14/16 0402  NA  --   < > 139 140  K  --   < > 4.1 3.7  CL  --   < > 109 107  CO2  --   < > 23 24  GLUCOSE  --   < > 107* 124*  BUN  --   < > 13 17  CREATININE  --   < > 0.83 0.83  CALCIUM  --   < > 9.9 10.3  MG 2.2  --   --   --   < > = values in this interval not displayed.  Lipid Panel:     Component Value Date/Time   CHOL 190 10/12/2016 0547   TRIG 97 10/12/2016 0547   HDL 42 10/12/2016 0547   CHOLHDL 4.5 10/12/2016 0547   VLDL 19 10/12/2016 0547   LDLCALC 129 (H) 10/12/2016 0547   HgbA1c:  Lab Results  Component Value Date   HGBA1C 5.4 10/12/2016   Urine Drug Screen:     Component Value Date/Time   LABOPIA POSITIVE (A) 10/11/2016 1125   COCAINSCRNUR NONE DETECTED 10/11/2016 1125   LABBENZ NONE DETECTED 10/11/2016 1125   AMPHETMU NONE DETECTED 10/11/2016 1125   THCU NONE DETECTED 10/11/2016 1125   LABBARB NONE DETECTED 10/11/2016 1125      IMAGING I have personally reviewed the radiological images below and agree with the radiology interpretations.  Dg Chest 2 View 10/11/2016 No active disease.  10/10/2016 No active  cardiopulmonary disease.   Ct Head Wo Contrast 10/11/2016 Normal head CT.   Ct Angio Head W Or Wo Contrast Ct Angio Neck W Or Wo Contrast 10/11/2016 1. 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 No visualized acute infarct. 2. Cervical carotid atherosclerosis without flow limiting stenosis. Moderate left cavernous ICA atheromatous narrowing. 3. Mild borderline moderate left subclavian origin stenosis. 4. Micronodules in the bilateral lungs, suspect respiratory bronchiolitis in this smoker. Emphysema is present. Full noncontrast chest CT recommended after convalescence.   Mr Brain Wo Contrast 10/11/2016 Acute/early subacute infarction within the right hemi pons and multiple additional small foci in the right cerebellar hemisphere, right brachium pontis, left pontomedullary junction, right occipital lobe, and right paramedian parietal lobe. No hemorrhage identified.   Ct Chest Abdomen Pelvis W Contrast 10/12/2016 IMPRESSION: 1. No findings to explain unintentional weight loss. 2. Left adrenal nodule measures 13 mm. This is nonspecific. Recommend 12 month follow-up adrenal CT. 3. Mild emphysema. Small poorly defined  nodules throughout both lungs, which may be inflammatory or secondary to smoking related lung disease. There is a ground-glass nodule in the right lower lobe measuring 8 mm, no definitive soft tissue component. Initial follow-up with CT at 6-12 months is recommended to confirm persistence. If persistent, repeat CT is recommended every 2 years until 5 years of stability has been established. This recommendation follows the consensus statement: Guidelines for Management of Incidental Pulmonary Nodules Detected on CT Images: From the Fleischner Society 2017; Radiology 2017; 284:228-243. 4. No convincing findings of malignancy. 5. Thoracoabdominal aortic atherosclerosis. 6. Small fat containing lower ventral abdominal wall hernia.    CUS - Bilateral: 1-39% ICA stenosis.  Vertebral artery flow is antegrade.  LE venous doppler - no DVT  TTE Left ventricle: The cavity size was normal. Wall thickness was   increased in a pattern of moderate LVH. Systolic function was   normal. The estimated ejection fraction was in the range of 60%   to 65%. Wall motion was normal; there were no regional wall   motion abnormalities. Features are consistent with a pseudonormal   left ventricular filling pattern, with concomitant abnormal   relaxation and increased filling pressure (grade 2 diastolic   dysfunction). - Aortic valve: Mildly to moderately calcified annulus. Moderately   thickened, moderately calcified leaflets.   PHYSICAL EXAM  General - The patient's general appearance was well nourished, well developed, in no apparent distress.    Ophthalmologic - fundi not visualized due to noncooperation.    Cardiovascular - Ausculation of the heart revealed regular rate  Mental Status -  Level of arousal and orientation to time, place, and person were intact. Language including expression, naming, repetition, comprehension was found intact, however, mild to moderate dysarthria. Fund of Knowledge was assessed and was intact.  Cranial Nerves II - XII - II - Vision intact OU. III, IV, VI - Extraocular movements intact. V - Facial sensation intact bilaterally. VII - mild left facial droop. VIII - Hearing & vestibular intact bilaterally. X - Palate elevates symmetrically, mild dysarthria. XI - Chin turning & shoulder shrug intact bilaterally. XII - Tongue protrusion intact.  Motor Strength - The patient's strength was normal RUE and RLE, however, left UE 4/5 and left LE 4+/5.   Motor Tone & Bulk - Muscle tone was assessed at the neck and appendages and found to be normal.  Bulk was normal and fasciculations were absent.   Reflexes - The patient's reflexes were normal in all extremities and she had no pathological reflexes.  Sensory - Light touch,  temperature/pinprick were assessed and were normal.    Coordination - The patient had normal movements in the right hand with no ataxia or dysmetria, left FTN ataxic but not out of proportional to weakness.  Tremor was absent.  Gait and Station - not able to test.   ASSESSMENT/PLAN Mercedes Gardner is a 71 y.o. female with history of HTN, smoker, anxiety, gout, and unintentional weight loss since September 2017 presenting with 2 day hx of 5-6 episodes of left facial drooping and numbness,left sided weakness and slurred speech. She did not receive IV t-PA due to delay in presentation. CT of the head no acute changes. As per patient and family, her symptoms never resolved back to normal, however fluctuating better or worse over the time. Initially ER patient symptoms improved, however later symptoms returned. 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 Heparin IV started and she was admitted to ICU for fluctuating BA thrombosis.  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 Micronodules in bilateral lungs  MRI  acute/subacute right hemi-pons and multiple small foci infarcts in the right cerebellar hemisphere, right brachium pontis, left pontomedullary junction, right occipital lobe, right paramedian parietal lobe infarcts  Carotid Dopplers unremarkable  2D Echo  EF 60-65%  LEvenous Dopplers negative for DVT  LDL 129   HgbA1c 5.4  IV heparin for VTE prophylaxis    No antithrombotic prior to admission, was on heparin IV, transitioned to coumadin with lovenox bridging.  INR goal 2-3.   Will need to repeat CTA head and neck in 2-3 months to decide duration of anticoagulation use.  Patient counseled to be compliant with her antithrombotic medications  Ongoing aggressive stroke risk factor  management  Therapy recommendations:  CIR  Disposition:  CIR today  Unintentional weight loss  CTA neck showed micronodules in both lungs  Heavy smoker for long time  DVT negative  CT chest, abd/pelvis no definite evidence for malignancy  Hypertension  Stable Permissive hypertension (OK if < 180/105) but gradually normalize in 5-7 days Long-term BP goal normotensive  Hyperlipidemia  Home meds:  No statin (had been on mevacor in the past, stopped 08/19/2013)  LDL 129, goal < 70  On lipitor 80mg   Continue statin on discharge  Tobacco abuse  Current smoker  Smoking cessation counseling provided  Nicotine patch provided  Pt is willing to quit  Other Stroke Risk Factors  Advanced age  Unhealthy life styles  Other Active Problems  Failure to thrive   Leukocytosis 19.1->8.0->6.6  Chronic pain/ gout  Acute hypokalemia - on supplement  Normocytic anemia  Pneumonia, on augmentin   Hospital day # 3  Neurology will sign off. Please call with questions. Pt will follow up with Dr. Erlinda Hong at [facility] in about 6 weeks. Thanks for the consult.   Rosalin Hawking, MD PhD Stroke Neurology 10/15/2016 12:37 AM   To contact Stroke Continuity provider, please refer to [email or web address] After hours, contact General Neurology

## 2016-10-15 NOTE — Progress Notes (Signed)
ANTICOAGULATION CONSULT NOTE - Follow Up Consult  Pharmacy Consult for lovenox and coumadin Indication: basilar artery thrombosis  Allergies  Allergen Reactions  . Tramadol Other (See Comments)    Per pt, she got depressed, moody, and had increased back pain when she took tramadol    Patient Measurements:   Heparin Dosing Weight:   Vital Signs: Temp: 98.6 F (37 C) (02/02 0355) Temp Source: Oral (02/02 0355) BP: 109/63 (02/02 0355) Pulse Rate: 72 (02/02 0355)  Labs:  Recent Labs  10/12/16 1219  10/13/16 0426 10/14/16 0402 10/15/16 0505  HGB  --   < > 10.7* 11.1* 11.4*  HCT  --   --  33.5* 35.2* 36.1  PLT  --   --  219 220 224  LABPROT  --   --   --  13.1 13.9  INR  --   --   --  0.99 1.06  HEPARINUNFRC 0.34  --  0.35  --   --   CREATININE  --   --  0.83 0.83 0.81  < > = values in this interval not displayed.  Estimated Creatinine Clearance (by C-G formula based on SCr of 0.81 mg/dL) Female: 58.8 mL/min Female: 69.1 mL/min   Medications:  Scheduled:  . amoxicillin-clavulanate  1 tablet Oral Q12H  . atorvastatin  80 mg Oral q1800  . enoxaparin (LOVENOX) injection  1 mg/kg Subcutaneous Q12H  . feeding supplement (ENSURE ENLIVE)  237 mL Oral BID BM  . feeding supplement (PRO-STAT SUGAR FREE 64)  30 mL Oral BID  . ferrous sulfate  325 mg Oral BID WC  . fluticasone  2 spray Each Nare BID  . folic acid  1 mg Oral Daily  . multivitamin with minerals  1 tablet Oral Daily  . nicotine  21 mg Transdermal Daily  . thiamine  100 mg Oral Daily  . 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  INR today is 1.06 and Hgb 11.4 and Plt 224 K stable.  Goal of Therapy:  Anti-Xa level 0.6-1 units/ml 4hrs after LMWH dose given; INR 2-3 Monitor platelets by anticoagulation protocol: Yes   Plan:  Continue  lovenox 1mg /kg subcutaneously q12h Repeat Warfarin 5mg  tonight x1 Daily INR/CBC Monitor s/sx of bleeding Educated while on 8M  Jeshawn Melucci, Tsz-Yin 10/15/2016,8:18 AM

## 2016-10-15 NOTE — Evaluation (Signed)
Speech Language Pathology Assessment and Plan  Patient Details  Name: Mercedes Gardner MRN: [id number] Date of Birth: 02/20/1946  SLP Diagnosis: Dysarthria;Cognitive Impairments  Rehab Potential: Excellent ELOS: 7 to 9 days    Today's Date: 10/15/2016 SLP Individual Time: 1500-1600 SLP Individual Time Calculation (min): 60 min   Problem List:  Patient Active Problem List   Diagnosis Date Noted  . Hemiparesis affecting left side as late effect of stroke ([place]) 10/14/2016  . Gait disturbance, post-stroke 10/14/2016  . Dysarthria   . Left-sided weakness   . Lung nodule   . Unintentional weight loss   . Cerebral infarction due to thrombosis of basilar artery ([place])   . Stroke-like symptoms 10/11/2016  . FTT (failure to thrive) in adult 10/11/2016  . Leukocytosis 10/11/2016  . Acute hypokalemia 10/11/2016  . Normocytic anemia 10/11/2016  . Cerebral thrombosis with cerebral infarction 10/11/2016  . Acute CVA (cerebrovascular accident) ([place]) 10/11/2016  . 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 Ischemic stroke ([place])   . Anxiety   . Hypokalemia   . Pelvic mass in female 12/26/2014  . Gout 12/24/2008  . ALLERGIC RHINITIS 12/20/2008  . KNEE PAIN, LEFT 12/20/2008  . TOBACCO ABUSE 07/29/2006  . HTN (hypertension) 07/29/2006  . HEMORRHOIDS 07/29/2006   Past Medical History:  Past Medical History:  Diagnosis Date  . Anemia   . Anxiety   . Arthritis    knees   . Dysrhythmia    heart skips a beat   . GERD (gastroesophageal reflux disease)   . Gout   . Hyperlipidemia April 2014  . Hypertension   . Shortness of breath dyspnea    due to pressure of cyst per patient    Past Surgical History:  Past Surgical History:  Procedure Laterality Date  . CESAREAN SECTION     x3  . OVARIAN CYST SURGERY Right 1980's  . SALPINGOOPHORECTOMY Bilateral 02/06/2015   Procedure: Campbell Stall LAPAROTOMY/BILATERAL SALPINGO OOPHORECTOMY;  Surgeon: Everitt Amber, MD;  Location: [facility] ORS;   Service: Gynecology;  Laterality: Bilateral;    Assessment / Plan / Recommendation Clinical Impression Patient is a 71 y.o.right handed female with history of hypertension, hyperlipidemia and tobacco abuse. Presented 10/10/2016 with waxing and waning symptoms of intermittent slurred speech and left-sided weakness.Noted 20 pound weight loss since September and poor eating habits. Per chart review patient lives with son independent prior to admission. Works as a Print production planner. Son works from home and can assist. MRI of the brain showed acute early subacute infarction within the right hemi-pons and multiple additional small foci in the right cerebellar hemisphere, left pontomedullary junction, right occipital lobe and right paramedian parietal lobe.WBC initially elevated 19,100 felt to be reactive improved to 6.9. 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 Lower extremity Dopplers negative for DVT. Carotid Doppler showed no ICA stenosis. Patient did not receive TPA. Echocardiogram with ejection fraction of 65% and grade 2 diastolic dysfunction. No regional wall motion abnormalities . CT chest abdomen pelvis completed for history of recent weight loss showing no evidence of malignancy. There was some incidental findings of pulmonary nodules recommend CT repeat in 6-12 months. 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 Advised need to repeat CTA of head and neck in 2-3 months to decide duration of anticoagulation use. Patient currently completing a course of Augmentin for suspect sinusitis and chest x-ray was completed that was negative. Bouts  of hypokalemia 2.6 corrected with potassium supplement. Physical and occupational therapy evaluation completed with recommendations of physical medicine rehabilitation consult.Patient transferred to CIR on 10/14/2016.   Speech-language evaluation on 10/15/16  revealed mild to moderate dysarthria resulting in decreased intelligibility at the sentence level to 60%. Pt also presents with moderate cognitive deficits as indicated with score of 22 out of 30 on the [facility] version 8.1 (n>/=26). Pt demonstrates deficits in sustained attention, semi-complex problem solving and emergent awareness. Pt requires skilled ST to increase functional independence prior to discharge to address these areas.    Skilled Therapeutic Interventions          Skilled treatment session focused on assessment of speech intelligibility and cognitive function. Results are above and reviewed with pt. Pt required Mod A verbal cues for problem solving functional activities with functional activies such as medication management helpful for independent return to home. Pt requires Mod A verbal cues to utilize speech intelligibility strategies at the sentence level.    SLP Assessment  Patient will need skilled Speech Lanaguage Pathology Services during CIR admission    Recommendations  Recommendations for Other Services: Neuropsych consult (dysfunctional relationship with boyfriend, depression with weight loss) Patient destination: Home Follow up Recommendations: Outpatient SLP Equipment Recommended: None recommended by SLP    SLP Frequency 3 to 5 out of 7 days   SLP Duration  SLP Intensity  SLP Treatment/Interventions 7 to 9 days  Minumum of 1-2 x/day, 30 to 90 minutes  Cueing hierarchy;Cognitive remediation/compensation;Therapeutic Activities;Patient/family education;Medication managment;Functional tasks    Pain Pain Assessment Pain Assessment: No/denies pain  Prior Functioning Cognitive/Linguistic Baseline: Within functional limits Type of Home: House  Lives With: Son Available Help at Discharge: Family;Available 24 hours/day Vocation: Full time employment  Function:    Cognition Comprehension Comprehension assist level: Understands complex 90% of the time/cues 10% of  the time  Expression   Expression assist level: Expresses basic needs/ideas: With extra time/assistive device  Social Interaction Social Interaction assist level: Interacts appropriately 75 - 89% of the time - Needs redirection for appropriate language or to initiate interaction.  Problem Solving Problem solving assist level: Solves basic problems with no assist  Memory Memory assist level: Requires cues to use assistive device   Short Term Goals: Week 1: SLP Short Term Goal 1 (Week 1): Pt will utilize speech intelligibility strategies at the sentence level with Min A verbal cues to achieve >90% intelligibility.  SLP Short Term Goal 2 (Week 1): Pt will demonstrate sustained attention to funcitonal task for 5 minutes with Min A verbal cues for redirection.  SLP Short Term Goal 3 (Week 1): Pt will self-monitor and correct verbal and functional errors with Mod A cues.  SLP Short Term Goal 4 (Week 1): Pt will demonstrate ability to manage medications with Min A verbal cues.  SLP Short Term Goal 5 (Week 1): Pt will demonstrate ability to perform money management with Min A verbal cues.  SLP Short Term Goal 6 (Week 1): Pt will demonstrate emergent awareness by identifying 2 physical and 2 cognitive deficits with Min A verbal cues.   Refer to Care Plan for Long Term Goals  Recommendations for other services: Neuropsych  Discharge Criteria: Patient will be discharged from SLP if patient refuses treatment 3 consecutive times without medical reason, if treatment goals not met, if there is a change in medical status, if patient makes no progress towards goals or if patient is discharged from hospital.  The above assessment, treatment plan, treatment alternatives  and goals were discussed and mutually agreed upon: by patient  Jakyrie Totherow B. Rutherford Nail, M.S., CCC-SLP Speech-Language Pathologist  Whitley Strycharz 10/15/2016, 5:08 PM

## 2016-10-15 NOTE — Evaluation (Signed)
Occupational Therapy Assessment and Plan  Patient Details  Name: Mercedes Gardner MRN: [id number] Date of Birth: October 09, 1945  OT Diagnosis: abnormal posture, disturbance of vision, hemiplegia affecting non-dominant side and muscle weakness (generalized) Rehab Potential: Rehab Potential (ACUTE ONLY): Good ELOS: 7-9 days   Today's Date: 10/15/2016 OT Individual Time: 1000-1100 OT Individual Time Calculation (min): 60 min     Problem List:  Patient Active Problem List   Diagnosis Date Noted  . Hemiparesis affecting left side as late effect of stroke ([place]) 10/14/2016  . Gait disturbance, post-stroke 10/14/2016  . Dysarthria   . Left-sided weakness   . Lung nodule   . Unintentional weight loss   . Cerebral infarction due to thrombosis of basilar artery ([place])   . Stroke-like symptoms 10/11/2016  . FTT (failure to thrive) in adult 10/11/2016  . Leukocytosis 10/11/2016  . Acute hypokalemia 10/11/2016  . Normocytic anemia 10/11/2016  . Cerebral thrombosis with cerebral infarction 10/11/2016  . Acute CVA (cerebrovascular accident) ([place]) 10/11/2016  . 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 Ischemic stroke ([place])   . Anxiety   . Hypokalemia   . Pelvic mass in female 12/26/2014  . Gout 12/24/2008  . ALLERGIC RHINITIS 12/20/2008  . KNEE PAIN, LEFT 12/20/2008  . TOBACCO ABUSE 07/29/2006  . HTN (hypertension) 07/29/2006  . HEMORRHOIDS 07/29/2006    Past Medical History:  Past Medical History:  Diagnosis Date  . Anemia   . Anxiety   . Arthritis    knees   . Dysrhythmia    heart skips a beat   . GERD (gastroesophageal reflux disease)   . Gout   . Hyperlipidemia April 2014  . Hypertension   . Shortness of breath dyspnea    due to pressure of cyst per patient    Past Surgical History:  Past Surgical History:  Procedure Laterality Date  . CESAREAN SECTION     x3  . OVARIAN CYST SURGERY Right 1980's  . SALPINGOOPHORECTOMY Bilateral 02/06/2015   Procedure:  Campbell Stall LAPAROTOMY/BILATERAL SALPINGO OOPHORECTOMY;  Surgeon: Everitt Amber, MD;  Location: [facility];  Service: Gynecology;  Laterality: Bilateral;    Assessment & Plan Clinical Impression: Patient is a 71 y.o. right handed female with history of hypertension, hyperlipidemia and tobacco abuse. Presented 10/10/2016 with waxing and waning symptoms of intermittent slurred speech and left-sided weakness.Noted 20 pound weight loss since September and poor eating habits. Per chart review patient lives with son independent prior to admission. Works as a Print production planner. Son works from home and can assist. MRI of the brain showed acute early subacute infarction within the right hemi-pons and multiple additional small foci in the right cerebellar hemisphere, left pontomedullary junction, right occipital lobe and right paramedian parietal lobe.WBC initially elevated 19,100 felt to be reactive improved to 6.9. 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 Lower extremity Dopplers negative for DVT. Carotid Doppler showed no ICA stenosis. Patient did not receive TPA. Echocardiogram with ejection fraction of 65% and grade 2 diastolic dysfunction. No regional wall motion abnormalities . CT chest abdomen pelvis completed for history of recent weight loss showing no evidence of malignancy. There was some incidental findings of pulmonary nodules recommend CT repeat in 6-12 months. 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 Advised need to repeat CTA of head and neck in 2-3 months to decide duration of anticoagulation use. Patient currently completing a course of Augmentin  for suspect sinusitis and chest x-ray was completed that was negative. Bouts of hypokalemia 2.6 corrected with potassium supplement. Physical and occupational therapy evaluation completed with recommendations of physical medicine rehabilitation  consult.   Patient transferred to CIR on 10/14/2016 .    Patient currently requires min-mod with basic self-care skills secondary to muscle weakness, impaired timing and sequencing, unbalanced muscle activation and decreased coordination, decreased visual motor skills, decreased attention, decreased awareness, decreased problem solving and decreased memory and decreased standing balance, decreased postural control, hemiplegia and decreased balance strategies.  Prior to hospitalization, patient could complete ADLswith independent .  Patient will benefit from skilled intervention to increase independence with basic self-care skills prior to discharge home with care partner.  Anticipate patient will require intermittent supervision and follow up home health and follow up outpatient.  OT - End of Session Activity Tolerance: Tolerates 30+ min activity without fatigue Endurance Deficit: No OT Assessment Rehab Potential (ACUTE ONLY): Good OT Patient demonstrates impairments in the following area(s): Balance;Cognition;Endurance;Motor;Pain;Perception;Safety;Vision OT Basic ADL's Functional Problem(s): Eating;Grooming;Bathing;Dressing;Toileting OT Advanced ADL's Functional Problem(s): Simple Meal Preparation;Laundry OT Transfers Functional Problem(s): Toilet;Tub/Shower OT Additional Impairment(s): Fuctional Use of Upper Extremity OT Plan OT Intensity: Minimum of 1-2 x/day, 45 to 90 minutes OT Frequency: 5 out of 7 days OT Duration/Estimated Length of Stay: 7-9 days OT Treatment/Interventions: Balance/vestibular training;Cognitive remediation/compensation;Community reintegration;Discharge planning;Disease mangement/prevention;DME/adaptive equipment instruction;Functional mobility training;Neuromuscular re-education;Pain management;Patient/family education;Psychosocial support;Self Care/advanced ADL retraining;Therapeutic Activities;Therapeutic Exercise;UE/LE Strength taining/ROM;Splinting/orthotics;UE/LE  Coordination activities;Visual/perceptual remediation/compensation OT Self Feeding Anticipated Outcome(s): Mod I OT Basic Self-Care Anticipated Outcome(s): Mod I OT Toileting Anticipated Outcome(s): Mod I OT Bathroom Transfers Anticipated Outcome(s): Mod I OT Recommendation Recommendations for Other Services: Therapeutic Recreation consult Therapeutic Recreation Interventions: Pet therapy;Kitchen group;Outing/community reintergration;Stress management Patient destination: Home Follow Up Recommendations: Home health OT;Outpatient OT Equipment Recommended: Tub/shower bench;To be determined   Skilled Therapeutic Intervention OT eval completed with discussion of rehab process, OT purpose, POC, ELOS, and goals.  ADL assessment completed with bathing at sit > stand level in room shower.  Pt ambulated to shower with min-mod assist and hand held assistance.  Min/steady assist for all standing balance during bathing and dressing.  Pt easily distracted both internally and externally, requiring cues to redirect back to task at hand.    OT Evaluation Precautions/Restrictions  Precautions Precautions: Fall Restrictions Weight Bearing Restrictions: No Pain Pain Assessment Pain Assessment: 0-10 Pain Score: 6  Pain Type: Acute pain Pain Location: Head Pain Descriptors / Indicators: Aching Pain Frequency: Constant Pain Onset: On-going Patients Stated Pain Goal: 3 Pain Intervention(s): Medication (See eMAR) Home Living/Prior Functioning Home Living Available Help at Discharge: Family Type of Home: House Home Access: Stairs to enter Technical brewer of Steps: 3 Entrance Stairs-Rails: Left Home Layout: One level Bathroom Shower/Tub: Tub/shower unit  Lives With: Son IADL History Homemaking Responsibilities: Yes Meal Prep Responsibility: Building surveyor Responsibility: Primary Occupation: Full time employment Prior Function Level of Independence: Independent with basic ADLs,  Independent with gait, Independent with homemaking with ambulation  Able to Take Stairs?: Yes Driving: No (would walk to work.  Daughter or friend would drive to store/appointments) Vocation: Other (comment) Comments: Worked as Print production planner. Very active  ADL   See Function Navigator Vision/Perception  Vision- History Baseline Vision/History: Wears glasses Wears Glasses: Reading only Patient Visual Report: No change from baseline Vision- Assessment Vision Assessment?: Yes Eye Alignment: Within Functional Limits Ocular Range of Motion: Within Functional Limits Tracking/Visual Pursuits: Impaired - to be further tested in functional context Saccades: Additional eye shifts  occurred during testing Visual Fields: No apparent deficits Additional Comments: during visual assessment, pt frequently loses fixation on target  Cognition Overall Cognitive Status: Impaired/Different from baseline Arousal/Alertness: Awake/alert Orientation Level: Person;Place;Situation Person: Oriented Place: Oriented Situation: Oriented Year: 2018 Month: February Day of Week: Correct Memory: Appears intact Immediate Memory Recall: Sock;Blue;Bed Memory Recall: Blue Memory Recall Blue: Without Cue Attention: Selective Selective Attention: Impaired Awareness: Impaired Problem Solving: Impaired Problem Solving Impairment: Functional basic Safety/Judgment: Appears intact Sensation Sensation Light Touch: Appears Intact Stereognosis: Not tested Hot/Cold: Appears Intact Coordination Gross Motor Movements are Fluid and Coordinated: No Fine Motor Movements are Fluid and Coordinated: No Finger Nose Finger Test: dysmetria on Lt Extremity/Trunk Assessment RUE Assessment RUE Assessment: Within Functional Limits (strength grossly 4/5) LUE Assessment LUE Assessment: Exceptions to WFL LUE AROM (degrees) Overall AROM Left Upper Extremity: Deficits LUE Overall AROM Comments: shoulder flexion grossly 120  degrees, decreased ability to reach back of head LUE Strength LUE Overall Strength: Deficits LUE Overall Strength Comments: grossly 3-/5   See Function Navigator for Current Functional Status.   Refer to Care Plan for Long Term Goals  Recommendations for other services: Therapeutic Recreation  Pet therapy, Kitchen group, Stress management and Outing/community reintegration   Discharge Criteria: Patient will be discharged from OT if patient refuses treatment 3 consecutive times without medical reason, if treatment goals not met, if there is a change in medical status, if patient makes no progress towards goals or if patient is discharged from hospital.  The above assessment, treatment plan, treatment alternatives and goals were discussed and mutually agreed upon: by patient  [name] 10/15/2016, 10:59 AM

## 2016-10-15 NOTE — Care Management Note (Signed)
Inpatient Rehabilitation Center Individual Statement of Services  Patient Name:  Mercedes Gardner  Date:  10/15/2016  Welcome to the [facility].  Our goal is to provide you with an individualized program based on your diagnosis and situation, designed to meet your specific needs.  With this comprehensive rehabilitation program, you will be expected to participate in at least 3 hours of rehabilitation therapies Monday-Friday, with modified therapy programming on the weekends.  Your rehabilitation program will include the following services:  Physical Therapy (PT), Occupational Therapy (OT), Speech Therapy (ST), 24 hour per day rehabilitation nursing, Therapeutic Recreaction (TR), Neuropsychology, Case Management (Social Worker), Rehabilitation Medicine, Nutrition Services and Pharmacy Services  Weekly team conferences will be held on Wednesday to discuss your progress.  Your Social Worker will talk with you frequently to get your input and to update you on team discussions.  Team conferences with you and your family in attendance may also be held.  Expected length of stay: 7-9 days Overall anticipated outcome: mod/i level  Depending on your progress and recovery, your program may change. Your Social Worker will coordinate services and will keep you informed of any changes. Your Social Worker's name and contact numbers are listed  below.  The following services may also be recommended but are not provided by the [facility] will be made to provide these services after discharge if needed.  Arrangements include referral to agencies that provide these services.  Your insurance has been verified to be:  Medicare & Medicaid Your primary doctor is:  Thedora Hinders  Pertinent information will be shared with your doctor and  your insurance company.  Social Worker:  Ovidio Kin, [place] or ([phone]  Information discussed with and copy given to patient by: Elease Hashimoto, 10/15/2016, 10:27 AM

## 2016-10-15 NOTE — Progress Notes (Signed)
Patient information reviewed and entered into eRehab system by Dontay Harm, RN, CRRN, PPS Coordinator.  Information including medical coding and functional independence measure will be reviewed and updated through discharge.     Per nursing patient was given "Data Collection Information Summary for Patients in Inpatient Rehabilitation Facilities with attached "Privacy Act Statement-Health Care Records" upon admission.  

## 2016-10-15 NOTE — Evaluation (Signed)
Physical Therapy Assessment and Plan  Patient Details  Name: Mercedes Gardner MRN: [id number] Date of Birth: 11-15-1945  PT Diagnosis: Abnormality of gait, Ataxic gait, Coordination disorder, Hemiparesis dominant, Impaired cognition and Muscle weakness Rehab Potential: Good ELOS: 7-9 days   Today's Date: 10/15/2016 PT Individual Time: 1345-1500 PT Individual Time Calculation (min): 75 min    Problem List:  Patient Active Problem List   Diagnosis Date Noted  . Hemiparesis affecting left side as late effect of stroke ([place]) 10/14/2016  . Gait disturbance, post-stroke 10/14/2016  . Dysarthria   . Left-sided weakness   . Lung nodule   . Unintentional weight loss   . Cerebral infarction due to thrombosis of basilar artery ([place])   . Stroke-like symptoms 10/11/2016  . FTT (failure to thrive) in adult 10/11/2016  . Leukocytosis 10/11/2016  . Acute hypokalemia 10/11/2016  . Normocytic anemia 10/11/2016  . Cerebral thrombosis with cerebral infarction 10/11/2016  . Acute CVA (cerebrovascular accident) ([place]) 10/11/2016  . 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 Ischemic stroke ([place])   . Anxiety   . Hypokalemia   . Pelvic mass in female 12/26/2014  . Gout 12/24/2008  . ALLERGIC RHINITIS 12/20/2008  . KNEE PAIN, LEFT 12/20/2008  . TOBACCO ABUSE 07/29/2006  . HTN (hypertension) 07/29/2006  . HEMORRHOIDS 07/29/2006    Past Medical History:  Past Medical History:  Diagnosis Date  . Anemia   . Anxiety   . Arthritis    knees   . Dysrhythmia    heart skips a beat   . GERD (gastroesophageal reflux disease)   . Gout   . Hyperlipidemia April 2014  . Hypertension   . Shortness of breath dyspnea    due to pressure of cyst per patient    Past Surgical History:  Past Surgical History:  Procedure Laterality Date  . CESAREAN SECTION     x3  . OVARIAN CYST SURGERY Right 1980's  . SALPINGOOPHORECTOMY Bilateral 02/06/2015   Procedure: [place] Stall LAPAROTOMY/BILATERAL  SALPINGO OOPHORECTOMY;  Surgeon: Everitt Amber, MD;  Location: [facility];  Service: Gynecology;  Laterality: Bilateral;    Assessment & Plan Clinical Impression: A 71 y.o. right handed female with history of hypertension, hyperlipidemia and tobacco abuse. Presented 10/10/2016 with waxing and waning symptoms of intermittent slurred speech and left-sided weakness. Noted 20 pound weight loss since September and poor eating habits. Per chart review patient lives with son independent prior to admission. Works as a Print production planner. Son works from home and can assist. MRI of the brain showed acute early subacute infarction within the right hemi-pons and multiple additional small foci in the right cerebellar hemisphere, left pontomedullary junction, right occipital lobe and right paramedian parietal lobe.WBC initially elevated 19,100 felt to be reactive improved to 6.9. 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 Lower extremity Dopplers negative for DVT. Carotid Doppler showed no ICA stenosis. Patient did not receive TPA. Echocardiogram with ejection fraction of 65% and grade 2 diastolic dysfunction. No regional wall motion abnormalities . CT chest abdomen pelvis completed for history of recent weight loss showing no evidence of malignancy. There was some incidental findings of pulmonary nodules recommend CT repeat in 6-12 months. 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 Advised need to repeat CTA of head and neck in 2-3 months to decide duration of anticoagulation use. Patient currently completing a course of Augmentin for suspect sinusitis and chest  x-ray was completed that was negative. Bouts of hypokalemia 2.6 corrected with potassium supplement. Physical and occupational therapy evaluation completed with recommendations of physical medicine rehabilitation consult. Patient to be admitted for a  comprehensive inpatient rehabilitation program.   Patient transferred to CIR on 10/14/2016 .   Patient currently requires mod with mobility secondary to muscle weakness, unbalanced muscle activation, ataxia and decreased coordination, decreased midline orientation, decreased attention, decreased awareness, decreased problem solving, decreased safety awareness, decreased memory and delayed processing and decreased sitting balance, decreased standing balance, decreased postural control and decreased balance strategies.  Prior to hospitalization, patient was independent  with mobility and lived with Son in a House home.  Home access is 3Stairs to enter.  Patient will benefit from skilled PT intervention to maximize safe functional mobility, minimize fall risk and decrease caregiver burden for planned discharge home with 24 hour supervision.  Anticipate patient will benefit from follow up [place] at discharge.  PT - End of Session Activity Tolerance: Tolerates 30+ min activity with multiple rests Endurance Deficit: No PT Assessment Rehab Potential (ACUTE/IP ONLY): Good Barriers to Discharge: Inaccessible home environment PT Patient demonstrates impairments in the following area(s): Balance;Endurance;Motor;Perception;Safety PT Transfers Functional Problem(s): Bed Mobility;Bed to Chair;Car;Furniture PT Locomotion Functional Problem(s): Stairs;Ambulation PT Plan PT Intensity: Minimum of 1-2 x/day ,45 to 90 minutes PT Frequency: 5 out of 7 days PT Duration Estimated Length of Stay: 7-9 days PT Treatment/Interventions: Ambulation/gait training;Balance/vestibular training;Community reintegration;Cognitive remediation/compensation;Discharge planning;Disease management/prevention;Functional electrical stimulation;DME/adaptive equipment instruction;Functional mobility training;Neuromuscular re-education;Patient/family education;Psychosocial support;Stair training;Splinting/orthotics;Therapeutic  Activities;Therapeutic Exercise;UE/LE Coordination activities;UE/LE Strength taining/ROM;Visual/perceptual remediation/compensation PT Transfers Anticipated Outcome(s): mod I PT Locomotion Anticipated Outcome(s): mod I household, supervision community PT Recommendation Recommendations for Other Services: Therapeutic Recreation consult Therapeutic Recreation Interventions: Stress management;Pet therapy Follow Up Recommendations: Home health PT Patient destination: Home Equipment Recommended: To be determined  Skilled Therapeutic Intervention Pt with no c/o pain.  Session focus on initial assessment, pt education on role of PT/goals of therapy/plan of care/safety plan/ELOS, gait training, postural control, balance, NMR, and activity tolerance.    Pt ambulates throughout unit with HHA (L and R) and with RW with overall mod assist to maintain upright posture and balance with pt demonstrating ataxic gait, frequent scissoring, and poor trunk control.  Stair negotiation x12 steps with mod assist for weight shifting and mod multimodal cues for sequencing.  NMR via nustep x12 minutes with 4 extremities for reciprocal stepping pattern retraining, strengthening, and activity tolerance.  Pt returned to room at end of session and positioned seated EOB to await SLP session.  Call bell in reach and needs met.   PT Evaluation Precautions/Restrictions Precautions Precautions: Fall Precaution Comments: ataxic gait, L hemiparesis Restrictions Weight Bearing Restrictions: No Pain Pain Assessment Pain Assessment: No/denies pain Pain Score: 3  Home Living/Prior Functioning Home Living Living Arrangements: Children Available Help at Discharge: Family;Available 24 hours/day Type of Home: House Home Access: Stairs to enter [employer] of Steps: 3 Entrance Stairs-Rails: Left (wall on the R) Home Layout: One level  Lives With: Son Prior Function Level of Independence: Independent with  transfers;Independent with gait  Able to Take Stairs?: Yes Vocation: Other (comment) Vision/Perception  Vision - Assessment Eye Alignment: Within Functional Limits Ocular Range of Motion: Within Functional Limits Tracking/Visual Pursuits: Impaired - to be further tested in functional context Saccades: Additional eye shifts occurred during testing Additional Comments: during visual assessment, pt frequently loses fixation on target Perception Comments: appears WFL, to be further tested  Cognition Overall Cognitive Status: Impaired/Different from baseline  Arousal/Alertness: Awake/alert Orientation Level: Oriented X4 Attention: Selective;Sustained Sustained Attention: Impaired Sustained Attention Impairment: Functional basic;Verbal basic Selective Attention: Impaired Awareness: Impaired Awareness Impairment: Emergent impairment;Anticipatory impairment Problem Solving: Impaired Safety/Judgment: Appears intact Sensation Sensation Light Touch: Appears Intact Stereognosis: Not tested Hot/Cold: Appears Intact Coordination Gross Motor Movements are Fluid and Coordinated: No Fine Motor Movements are Fluid and Coordinated: No Finger Nose Finger Test: dysmetria on Lt Motor  Motor Motor: Abnormal postural alignment and control;Other (comment) Motor - Skilled Clinical Observations: L hemiparesis  Mobility Bed Mobility Bed Mobility: Supine to Sit Supine to Sit: 5: Supervision;With rails Transfers Transfers: Yes Sit to Stand: 4: Min assist Stand to Sit: 4: Min assist Locomotion  Ambulation Ambulation: Yes Ambulation/Gait Assistance: 3: Mod assist Ambulation Distance (Feet): 150 Feet Assistive device: 1 person hand held assist Gait Gait: Yes Gait Pattern: Ataxic;Narrow base of support (staggering) Gait velocity: decreased Stairs / Additional Locomotion Stairs: Yes Stairs Assistance: 3: Mod assist Stair Management Technique: One rail Right;Two rails;Step to  pattern;Forwards Number of Stairs: 12 Wheelchair Mobility Wheelchair Mobility: No  Trunk/Postural Assessment  Cervical Assessment Cervical Assessment: Within Functional Limits Thoracic Assessment Thoracic Assessment: Within Functional Limits Lumbar Assessment Lumbar Assessment: Within Functional Limits Postural Control Postural Control: Deficits on evaluation Protective Responses: delayed Postural Limitations: decreased  Balance Balance Balance Assessed: Yes Static Standing Balance Static Standing - Balance Support: Left upper extremity supported;Right upper extremity supported;During functional activity Static Standing - Level of Assistance: 4: Min assist Dynamic Standing Balance Dynamic Standing - Balance Support: Right upper extremity supported;Left upper extremity supported;During functional activity Dynamic Standing - Level of Assistance: 4: Min assist;3: Mod assist Extremity Assessment  RUE Assessment RUE Assessment: Within Functional Limits (strength grossly 4/5) LUE Assessment LUE Assessment: Exceptions to WFL LUE AROM (degrees) Overall AROM Left Upper Extremity: Deficits LUE Overall AROM Comments: shoulder flexion grossly 120 degrees, decreased ability to reach back of head LUE Strength LUE Overall Strength: Deficits LUE Overall Strength Comments: grossly 3-/5 RLE Assessment RLE Assessment: Exceptions to [facility] RLE AROM (degrees) RLE Overall AROM Comments: WFl assessed in sitting RLE Strength Right Hip Flexion: 4-/5 Right Knee Flexion: 4/5 Right Knee Extension: 4/5 Right Ankle Dorsiflexion: 4/5 Right Ankle Plantar Flexion: 4/5 LLE Assessment LLE Assessment: Exceptions to WFL LLE AROM (degrees) LLE Overall AROM Comments: WFL assessed in sitting LLE Strength Left Hip Flexion: 3+/5 Left Knee Flexion: 4/5 Left Knee Extension: 4/5 Left Ankle Dorsiflexion: 4/5 Left Ankle Plantar Flexion: 4/5   See Function Navigator for Current Functional Status.   Refer to  Care Plan for Long Term Goals  Recommendations for other services: Therapeutic Recreation  Pet therapy and Stress management  Discharge Criteria: Patient will be discharged from PT if patient refuses treatment 3 consecutive times without medical reason, if treatment goals not met, if there is a change in medical status, if patient makes no progress towards goals or if patient is discharged from hospital.  The above assessment, treatment plan, treatment alternatives and goals were discussed and mutually agreed upon: by patient  Urban Gibson E Penven-Crew 10/15/2016, 2:41 PM

## 2016-10-15 NOTE — Plan of Care (Signed)
Problem: RH SKIN INTEGRITY Goal: RH STG SKIN FREE OF INFECTION/BREAKDOWN Outcome: Progressing Lotion for dry, calloused areas (r ankle and heel)

## 2016-10-15 NOTE — H&P (Signed)
Subjective/Complaints: Slept ok, worried about another stroke, we discussed risk and risk factor mitigation  ROS- neg for CP, SOB, N/V/D  Objective: Vital Signs: Blood pressure 109/63, pulse 72, temperature 98.6 F (37 C), temperature source Oral, resp. rate 17, SpO2 100 %. No results found. Results for orders placed or performed during the hospital encounter of 10/14/16 (from the past 72 hour(s))  CBC WITH DIFFERENTIAL     Status: Abnormal   Collection Time: 10/15/16  5:05 AM  Result Value Ref Range   WBC 8.5 4.0 - 10.5 K/uL   RBC 3.82 (L) 3.87 - 5.11 MIL/uL   Hemoglobin 11.4 (L) 12.0 - 15.0 g/dL   HCT 36.1 36.0 - 46.0 %   MCV 94.5 78.0 - 100.0 fL   MCH 29.8 26.0 - 34.0 pg   MCHC 31.6 30.0 - 36.0 g/dL   RDW 13.2 11.5 - 15.5 %   Platelets 224 150 - 400 K/uL   Neutrophils Relative % 73 %   Neutro Abs 6.3 1.7 - 7.7 K/uL   Lymphocytes Relative 22 %   Lymphs Abs 1.8 0.7 - 4.0 K/uL   Monocytes Relative 3 %   Monocytes Absolute 0.3 0.1 - 1.0 K/uL   Eosinophils Relative 2 %   Eosinophils Absolute 0.2 0.0 - 0.7 K/uL   Basophils Relative 0 %   Basophils Absolute 0.0 0.0 - 0.1 K/uL  Comprehensive metabolic panel     Status: Abnormal   Collection Time: 10/15/16  5:05 AM  Result Value Ref Range   Sodium 139 135 - 145 mmol/L   Potassium 3.5 3.5 - 5.1 mmol/L   Chloride 104 101 - 111 mmol/L   CO2 26 22 - 32 mmol/L   Glucose, Bld 170 (H) 65 - 99 mg/dL   BUN 15 6 - 20 mg/dL   Creatinine, Ser 0.81 0.44 - 1.00 mg/dL   Calcium 10.1 8.9 - 10.3 mg/dL   Total Protein 6.7 6.5 - 8.1 g/dL   Albumin 3.3 (L) 3.5 - 5.0 g/dL   AST 38 15 - 41 U/L   ALT 32 14 - 54 U/L   Alkaline Phosphatase 93 38 - 126 U/L   Total Bilirubin 0.4 0.3 - 1.2 mg/dL   GFR calc non Af Amer >60 >60 mL/min   GFR calc Af Amer >60 >60 mL/min    Comment: (NOTE) The eGFR has been calculated using the CKD EPI equation. This calculation has not been validated in all clinical situations. eGFR's persistently <60 mL/min  signify possible Chronic Kidney Disease.    Anion gap 9 5 - 15  Protime-INR     Status: None   Collection Time: 10/15/16  5:05 AM  Result Value Ref Range   Prothrombin Time 13.9 11.4 - 15.2 seconds   INR 1.06       General: No acute distress Mood and affect are appropriate Heart: Regular rate and rhythm no rubs murmurs or extra sounds, -JVD Lungs: Clear to auscultation, breathing unlabored, no rales or wheezes Abdomen: Positive bowel sounds, soft nontender to palpation, nondistended Extremities: No clubbing, cyanosis, or edema Skin: No evidence of breakdown, no evidence of rash Neurologic: Cranial nerves II through XII intact, motor strength is 5/5 in RIght  deltoid, bicep, tricep, grip, hip flexor, knee extensors, ankle dorsiflexor and plantar flexor, 3- LUE and 4-LLE Sensory exam normal sensation to light touch and proprioception in bilateral upper and lower extremities Cerebellar exam normal finger to nose to finger as well as heel to shin in bilateral  upper and lower extremities Musculoskeletal: Full range of motion in all 4 extremities. No joint swelling   Assessment/Plan: 1. 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 Physiatrist is providing close team supervision and 24 hour management of active medical problems listed below. Physiatrist and rehab team continue to assess barriers to discharge/monitor patient progress toward functional and medical goals. FIM:                   Function - Comprehension Comprehension: Auditory Comprehension assist level: Understands complex 90% of the time/cues 10% of the time  Function - Expression Expression: Verbal Expression assist level: Expresses basic needs/ideas: With extra  time/assistive device  Function - Social Interaction Social Interaction assist level: Interacts appropriately 75 - 89% of the time - Needs redirection for appropriate language or to initiate interaction.  Function - Problem Solving Problem solving assist level: Solves basic problems with no assist  Function - Memory Memory assist level: Complete Independence: No helper Patient normally able to recall (first 3 days only): Staff names and faces, That he or she is in a hospital  Medical Problem List and Plan:  1. 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 DVT Prophylaxis/Anticoagulation: Coumadin therapy with Lovenox for bridging. Monitor for any signs of bleeding. INR goal 2-3. Still very low 1.06 Plan repeat CTA of head and neck 2-3 months to decide duration of anticoagulation use  3. Pain Management: Tylenol as needed  4. Mood: Provide emotional support  5. Neuropsych: This patient is capable of making decisions on her own behalf.  6. Skin/Wound Care: Routine skin checks  7. Fluids/Electrolytes/Nutrition: Routine I&O with follow-up chemistries  8. Hypertension. Permissive hypertension. Patient on Norvasc 10 mg daily prior to admission. Resume as needed  Vitals:   10/14/16 1822 10/15/16 0355  BP: 136/66 109/63  Pulse: 86 72  Resp: 18 17  Temp: 97.9 F (36.6 C) 98.6 F (37 C)   9. Tobacco abuse. Nicoderm patch. Provide counseling  10. Sinusitis. Complete course of Augmentin.  11. Hyperlipidemia. Lipitor  12. Decreased nutritional storage. TSH 0.451. Dietary supplements. Dietary consult  13. Leukocytosis. Resolved. Follow-up CBC  14. Hypokalemia. Follow-up chemistries  15. Normocytic anemia. Continue iron supplementation Hgb mildly reduced at 11.4  16. History of gout. Monitor for any signs of flareup  17. Constipation. Laxative  assistance  18.  Mild hypoalbuminemia, prostat LOS (Days) 1 A FACE TO FACE EVALUATION WAS PERFORMED  KIRSTEINS,ANDREW E 10/15/2016, 6:48 AM

## 2016-10-15 NOTE — Progress Notes (Signed)
Date of Service: 10/12/2016 3:28 PM   Meredith Staggers, MD  Physical Medicine and Rehabilitation  Expand All Collapse All   [] Hide copied text [] Hover for attribution information      Physical Medicine and Rehabilitation Consult Reason for Consult: Acute early subacute infarct right Mercedes-pons and multiple additional small foci in the right cerebellar hemisphere, left pontomedullary junction and right occipital lobe Referring Physician: Dr.Xu   HPI: Mercedes Gardner is a 71 y.o. right handed female with history of hypertension, hyperlipidemia and tobacco abuse. Presented 10/10/2016 with waxing and waning symptoms of intermittent slurred speech and left-sided weakness. Per chart review patient lives with son independent prior to admission. Works as a Print production planner. Son works from home and can assist. 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 Lower extremity Dopplers negative for DVT. Carotid Doppler showed no ICA stenosis. Patient did not receive TPA. Echocardiogram and remaining neuro workup currently ongoing. Presently on IV heparin therapy. Physical therapy evaluation completed with recommendations of physical medicine rehabilitation consult.   Review of Systems  Constitutional: Negative for chills and fever.  HENT: Negative for hearing loss and tinnitus.   Eyes: Negative for blurred vision and double vision.  Respiratory: Positive for shortness of breath. Negative for cough.   Cardiovascular: Positive for palpitations and leg swelling. Negative for chest pain.  Gastrointestinal: Positive for constipation. Negative for nausea and vomiting.  Genitourinary: Negative for dysuria, flank  pain and hematuria.  Musculoskeletal: Positive for myalgias.  Skin: Negative for rash.  Neurological: Positive for sensory change and weakness. Negative for seizures.  Psychiatric/Behavioral:       Anxiety  All other systems reviewed and are negative.      Past Medical History:  Diagnosis Date  . Anemia   . Anxiety   . Arthritis    knees   . Dysrhythmia    heart skips a beat   . GERD (gastroesophageal reflux disease)   . Gout   . Hyperlipidemia April 2014  . Hypertension   . Shortness of breath dyspnea    due to pressure of cyst per patient         Past Surgical History:  Procedure Laterality Date  . CESAREAN SECTION     x3  . OVARIAN CYST SURGERY Right 1980's  . SALPINGOOPHORECTOMY Bilateral 02/06/2015   Procedure: Campbell Stall LAPAROTOMY/BILATERAL SALPINGO OOPHORECTOMY;  Surgeon: Everitt Amber, MD;  Location: [facility];  Service: Gynecology;  Laterality: Bilateral;        Family History  Problem Relation Age of Onset  . Cancer Mother   . Hypertension Mother   . Diabetes Mother   . Diabetes Sister   . Hypertension Sister    Social History:  reports that she has been smoking Cigarettes.  She has been smoking about 0.02 packs per day. She has never used smokeless tobacco. She reports that she does not drink alcohol or use drugs. Allergies:       Allergies  Allergen Reactions  . Tramadol     Per pt, she got depressed, moody, and had increased back pain when she took tramadol         Medications Prior to Admission  Medication Sig Dispense Refill  . acetaminophen (TYLENOL) 500 MG tablet Take 2 tablets (1,000  mg total) by mouth every 6 (six) hours. 30 tablet 0  . acetaminophen-codeine (TYLENOL #3) 300-30 MG per tablet   0  . acetaminophen-codeine (TYLENOL #3) 300-30 MG tablet Take 1 tablet by mouth every 4 (four) hours as needed for moderate pain.    [name] Kitchen amLODipine (NORVASC) 10 MG tablet Take 10 mg by mouth every morning.     [name] Kitchen  amoxicillin-clavulanate (AUGMENTIN) 875-125 MG tablet Take 1 tablet by mouth 2 (two) times daily. 14 tablet 0  . busPIRone (BUSPAR) 10 MG tablet Take 10 mg by mouth 2 (two) times daily.     . busPIRone (BUSPAR) 5 MG tablet Take 5 mg by mouth 2 (two) times daily as needed.    . clobetasol ointment (TEMOVATE) [id number] % Apply 1 application topically 2 (two) times daily. 60 g 0  . colchicine (COLCRYS) 0.6 MG tablet Take 0.6 mg by mouth 2 (two) times daily.    . colchicine 0.6 MG tablet Take 1 tablet (0.6 mg total) by mouth daily. 30 tablet 0  . diclofenac sodium (VOLTAREN) 1 % GEL Apply 4 g topically 4 (four) times daily. To both knees, please instruct in dosing. (Patient taking differently: Apply 4 g topically 4 (four) times daily as needed (PAIN APPLIES TO BOTH KNEES). ) 3 Tube 0  . fexofenadine-pseudoephedrine (ALLEGRA-D) 60-120 MG per tablet Take 1 tablet by mouth 2 (two) times daily as needed (ALLERGIES).    . fluticasone (FLONASE) 50 MCG/ACT nasal spray Place 2 sprays into both nostrils 2 (two) times daily.     . Gauze Pads & Dressings (GAUZE DRESSING) 4"X4" PADS 1 packet by Does not apply route 2 (two) times daily. 60 each 1  . Gauze Pads & Dressings (GAUZE DRESSING) 4"X4" PADS 1 Device by Does not apply route 2 (two) times daily. 40 each 0  . ibuprofen (ADVIL,MOTRIN) 800 MG tablet Take 1 tablet (800 mg total) by mouth 3 (three) times daily. 30 tablet 0  . loratadine (CLARITIN) 10 MG tablet Take 10 mg by mouth every morning.     [name] Kitchen omeprazole (PRILOSEC) 20 MG capsule Take 20 mg by mouth every morning.   0  . OVER THE COUNTER MEDICATION Place 2 drops into both eyes daily as needed (DRY EYES, RED EYES).    [name] Kitchen oxyCODONE (OXY IR/ROXICODONE) 5 MG immediate release tablet Take 1 tablet (5 mg total) by mouth every 4 (four) hours as needed for severe pain. Do not take and drive 30 tablet 0  . fluconazole (DIFLUCAN) 100 MG tablet Take 1 tablet (100 mg total) by mouth daily. (Patient not taking:  Reported on 10/11/2016) 10 tablet 0  . indomethacin (INDOCIN SR) 75 MG CR capsule Take 1 capsule (75 mg total) by mouth 2 (two) times daily with a meal. 60 capsule 0  . indomethacin (INDOCIN) 50 MG capsule Take 50 mg by mouth 3 (three) times daily as needed for mild pain.   0  . ipratropium (ATROVENT) 0.06 % nasal spray Place 2 sprays into both nostrils 4 (four) times daily. 15 mL 1  . magnesium hydroxide (MILK OF MAGNESIA) 400 MG/5ML suspension Take 30 mLs by mouth every 8 (eight) hours. 360 mL 0  . meloxicam (MOBIC) 7.5 MG tablet Take 1 tablet (7.5 mg total) by mouth daily. 10 tablet 0  . Misc. Devices (CANE) MISC 1 Device by Does not apply route once. (Patient not taking: Reported on 10/11/2016) 1 each 0  . neomycin-bacitracin-polymyxin (NEOSPORIN) ointment Apply 1 application topically every 12 (twelve) hours.  apply to abdomnal wound (Patient not taking: Reported on 10/11/2016) 15 g 0  . NICORETTE 4 MG gum Take 1 each (4 mg total) by mouth 3 (three) times daily. (Patient not taking: Reported on 10/11/2016) 100 tablet 0  . predniSONE (DELTASONE) 10 MG tablet Take 6 tablets on day 1, 5 on day 2, 4 on day 3, 3 on day 4, 2 on day 5, and 1 on day 6. (Patient not taking: Reported on 10/11/2016) 21 tablet 0    Home: Home Living Family/patient expects to be discharged to:: Inpatient rehab Living Arrangements: Children (with son) Available Help at Discharge: Family Type of Home: House Home Access: Stairs to enter Technical brewer of Steps: 3 Home Layout: One level Home Equipment: Crutches  Lives With: Son  Functional History: Prior Function Level of Independence: Independent Comments: Works as Print production planner. Functional Status:  Mobility: Bed Mobility Overal bed mobility: Needs Assistance Bed Mobility: Supine to Sit, Sit to Supine Supine to sit: HOB elevated, Min assist Sit to supine: Min assist General bed mobility comments: Assist to bring LLE out of bed and into bed. Increased  time. Transfers Overall transfer level: Needs assistance Equipment used: 1 person hand held assist Transfers: Sit to/from Stand Sit to Stand: Min assist General transfer comment: Assist to power to standing and for balance due to impulsivity. Stood from [employer], from toilet x1. Stood without assist from toilet despite cues to call for help and LOB into wall. Ambulation/Gait Ambulation/Gait assistance: Mod assist, +2 safety/equipment Ambulation Distance (Feet): 12 Feet (x2 bouts) Assistive device: 1 person hand held assist Gait Pattern/deviations: Scissoring, Ataxic, Step-through pattern, Decreased step length - right, Decreased step length - left General Gait Details: Ataxic like gait pattern with incoordinated movement LLE; pt impulsive and fast with gait speed. Poor safety awareness. 1 LOB when standing from toilet without assist.   ADL:  Cognition: Cognition Overall Cognitive Status: Within Functional Limits for tasks assessed Orientation Level: Oriented X4 Cognition Arousal/Alertness: Awake/alert Behavior During Therapy: Impulsive Overall Cognitive Status: Within Functional Limits for tasks assessed  Blood pressure (!) 133/104, pulse 94, temperature 98.2 F (36.8 C), temperature source Oral, resp. rate (!) 25, height 5\' 5"  (1.651 m), weight 56 kg (123 lb 7.3 oz), SpO2 97 %. Physical Exam  HENT:  Mild left facial droop  Eyes:  Pupils round and reactive to light  Neck: Normal range of motion. Neck supple. No thyromegaly present.  Cardiovascular: Normal rate and regular rhythm.   Respiratory: Breath sounds normal. No respiratory distress.  GI: Soft. Bowel sounds are normal. She exhibits no distension.  Neurological: She is alert.  Anxious. Follows simple commands. Provides name and age as well as date of birth. Speech is mildly dysarthric but intelligible. Fair awareness of deficits. Left facial weakness. RUE and RLE 5/5.  LUE 4/5 prox to distal. LLE 4 to 4+/5. No sensory  deficits  Skin: Skin is warm and dry.  Psychiatric: She has a normal mood and affect. Her behavior is normal.    LabResultsLast24Hours  Results for orders placed or performed during the hospital encounter of 10/11/16 (from the past 24 hour(s))  TSH     Status: None   Collection Time: 10/11/16  3:37 PM  Result Value Ref Range   TSH 0.451 0.350 - 4.500 uIU/mL  Ammonia     Status: None   Collection Time: 10/11/16  3:37 PM  Result Value Ref Range   Ammonia 27 9 - 35 umol/L  Vitamin B12  Status: None   Collection Time: 10/11/16  3:37 PM  Result Value Ref Range   Vitamin B-12 351 180 - 914 pg/mL  Folate     Status: None   Collection Time: 10/11/16  3:37 PM  Result Value Ref Range   Folate 19.5 >5.9 ng/mL  Iron and TIBC     Status: Abnormal   Collection Time: 10/11/16  3:37 PM  Result Value Ref Range   Iron 21 (L) 28 - 170 ug/dL   TIBC 302 250 - 450 ug/dL   Saturation Ratios 7 (L) 10.4 - 31.8 %   UIBC 281 ug/dL  Ferritin     Status: None   Collection Time: 10/11/16  3:37 PM  Result Value Ref Range   Ferritin 70 11 - 307 ng/mL  Reticulocytes     Status: Abnormal   Collection Time: 10/11/16  3:37 PM  Result Value Ref Range   Retic Ct Pct 1.5 0.4 - 3.1 %   RBC. 3.77 (L) 3.87 - 5.11 MIL/uL   Retic Count, Manual 56.6 19.0 - 186.0 K/uL  RPR     Status: None   Collection Time: 10/11/16  3:37 PM  Result Value Ref Range   RPR Ser Ql Non Reactive Non Reactive  Procalcitonin - Baseline     Status: None   Collection Time: 10/11/16  3:37 PM  Result Value Ref Range   Procalcitonin 0.23 ng/mL  Uric acid     Status: None   Collection Time: 10/11/16  3:37 PM  Result Value Ref Range   Uric Acid, Serum 4.9 2.3 - 6.6 mg/dL  Lactic acid, plasma     Status: None   Collection Time: 10/11/16  3:37 PM  Result Value Ref Range   Lactic Acid, Venous 1.0 0.5 - 1.9 mmol/L  MRSA PCR Screening     Status: None   Collection Time: 10/11/16  8:22 PM  Result Value  Ref Range   MRSA by PCR NEGATIVE NEGATIVE  Heparin level (unfractionated)     Status: Abnormal   Collection Time: 10/11/16 10:29 PM  Result Value Ref Range   Heparin Unfractionated 0.19 (L) 0.30 - 0.70 IU/mL  CBC with Differential/Platelet     Status: Abnormal   Collection Time: 10/12/16  5:47 AM  Result Value Ref Range   WBC 8.0 4.0 - 10.5 K/uL   RBC 3.69 (L) 3.87 - 5.11 MIL/uL   Hemoglobin 11.2 (L) 12.0 - 15.0 g/dL   HCT 34.6 (L) 36.0 - 46.0 %   MCV 93.8 78.0 - 100.0 fL   MCH 30.4 26.0 - 34.0 pg   MCHC 32.4 30.0 - 36.0 g/dL   RDW 13.2 11.5 - 15.5 %   Platelets 227 150 - 400 K/uL   Neutrophils Relative % 72 %   Lymphocytes Relative 24 %   Monocytes Relative 3 %   Eosinophils Relative 1 %   Basophils Relative 0 %   Neutro Abs 5.8 1.7 - 7.7 K/uL   Lymphs Abs 1.9 0.7 - 4.0 K/uL   Monocytes Absolute 0.2 0.1 - 1.0 K/uL   Eosinophils Absolute 0.1 0.0 - 0.7 K/uL   Basophils Absolute 0.0 0.0 - 0.1 K/uL   Smear Review MORPHOLOGY UNREMARKABLE   Comprehensive metabolic panel     Status: Abnormal   Collection Time: 10/12/16  5:47 AM  Result Value Ref Range   Sodium 143 135 - 145 mmol/L   Potassium 3.1 (L) 3.5 - 5.1 mmol/L   Chloride 108 101 - 111 mmol/L  CO2 26 22 - 32 mmol/L   Glucose, Bld 102 (H) 65 - 99 mg/dL   BUN 7 6 - 20 mg/dL   Creatinine, Ser 0.71 0.44 - 1.00 mg/dL   Calcium 9.8 8.9 - 10.3 mg/dL   Total Protein 7.1 6.5 - 8.1 g/dL   Albumin 3.2 (L) 3.5 - 5.0 g/dL   AST 16 15 - 41 U/L   ALT 14 14 - 54 U/L   Alkaline Phosphatase 100 38 - 126 U/L   Total Bilirubin 0.6 0.3 - 1.2 mg/dL   GFR calc non Af Amer >60 >60 mL/min   GFR calc Af Amer >60 >60 mL/min   Anion gap 9 5 - 15  Heparin level (unfractionated)     Status: Abnormal   Collection Time: 10/12/16  5:47 AM  Result Value Ref Range   Heparin Unfractionated 0.28 (L) 0.30 - 0.70 IU/mL  Lipid panel     Status: Abnormal   Collection Time: 10/12/16  5:47 AM  Result Value  Ref Range   Cholesterol 190 0 - 200 mg/dL   Triglycerides 97 <150 mg/dL   HDL 42 >40 mg/dL   Total CHOL/HDL Ratio 4.5 RATIO   VLDL 19 0 - 40 mg/dL   LDL Cholesterol 129 (H) 0 - 99 mg/dL  HIV antibody     Status: None   Collection Time: 10/12/16  7:36 AM  Result Value Ref Range   HIV Screen 4th Generation wRfx Non Reactive Non Reactive  Heparin level (unfractionated)     Status: None   Collection Time: 10/12/16 12:19 PM  Result Value Ref Range   Heparin Unfractionated 0.34 0.30 - 0.70 IU/mL      ImagingResults(Last48hours)  Ct Angio Head W Or Wo Contrast  Result Date: 10/11/2016 CLINICAL DATA:  Fluctuating slurred speech and left facial numbness and drooping. Generalized weakness. EXAM: CT ANGIOGRAPHY HEAD AND NECK TECHNIQUE: Multidetector CT imaging of the head and neck was performed using the standard protocol during bolus administration of intravenous contrast. Multiplanar CT image reconstructions and MIPs were obtained to evaluate the vascular anatomy. Carotid stenosis measurements (when applicable) are obtained utilizing NASCET criteria, using the distal internal carotid diameter as the denominator. CONTRAST:  50 cc Isovue 370 intravenous COMPARISON:  Head CT from earlier today FINDINGS: CTA NECK FINDINGS Aortic arch: Mild atheromatous changes. Aberrant right subclavian artery. Common common carotid origin. Right carotid system: Overall mild volume of calcified atherosclerotic plaque mainly centered on the carotid bifurcation. No flow limiting stenosis, ulceration, or dissection. Left carotid system: Mild mainly calcified plaque at the carotid bifurcation without stenosis, ulceration, or dissection. Vertebral arteries: Mild borderline moderate narrowing at the left subclavian ostium, likely not affecting vertebral flow. Intermittent tortuosity of the vertebral arteries without flow limiting stenosis or signs of dissection. Prominent noncalcified atheromatous changes on the  right subclavian artery beyond the thyrocervical trunk. Skeleton: No acute or aggressive finding. Other neck: No incidental mass or adenopathy. Upper chest: Innumerable irregularly-shaped nodules in the apical lungs. Few emphysematous spaces. Review of the MIP images confirms the above findings CTA HEAD FINDINGS Anterior circulation: Carotid siphon atherosclerosis with moderate left cavernous segment stenosis. Smaller left ICA in the setting of aplastic left A1 segment. No major branch occlusion or flow limiting stenosis to explain symptoms. Posterior circulation: Symmetric vertebral arteries. There is severe focal mid basilar stenosis with irregular luminal appearance compatible with thrombus or clot. Downstream major branches are patent. Venous sinuses: Patent Anatomic variants: Aplastic left A1 segment Delayed phase: No parenchymal enhancement  or mass P Critical Value/emergent results were called by telephone at the time of interpretation on 10/11/2016 at 3:28 pm to Dr. Rosalin Hawking , who verbally acknowledged these results. Review of the MIP images confirms the above findings IMPRESSION: 1. 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 No visualized acute infarct. 2. Cervical carotid atherosclerosis without flow limiting stenosis. Moderate left cavernous ICA atheromatous narrowing. 3. Mild borderline moderate left subclavian origin stenosis. 4. Micronodules in the bilateral lungs, suspect respiratory bronchiolitis in this smoker. Emphysema is present. Full noncontrast chest CT recommended after convalescence. Electronically Signed   By: Monte Fantasia M.D.   On: 10/11/2016 15:35   Ct Head Wo Contrast  Result Date: 10/11/2016 CLINICAL DATA:  Left-sided weakness, facial droop, slurred speech. EXAM: CT HEAD WITHOUT CONTRAST TECHNIQUE: Contiguous axial images were obtained from the base of the skull through the vertex without intravenous contrast. COMPARISON:  CT scan of October 10, 2016.  FINDINGS: Brain: No evidence of acute infarction, hemorrhage, hydrocephalus, extra-axial collection or mass lesion/mass effect. Vascular: No hyperdense vessel or unexpected calcification. Skull: Normal. Negative for fracture or focal lesion. Sinuses/Orbits: No acute finding. Other: None. IMPRESSION: Normal head CT. Electronically Signed   By: Marijo Conception, M.D.   On: 10/11/2016 10:41   Ct Angio Neck W Or Wo Contrast  Result Date: 10/11/2016 CLINICAL DATA:  Fluctuating slurred speech and left facial numbness and drooping. Generalized weakness. EXAM: CT ANGIOGRAPHY HEAD AND NECK TECHNIQUE: Multidetector CT imaging of the head and neck was performed using the standard protocol during bolus administration of intravenous contrast. Multiplanar CT image reconstructions and MIPs were obtained to evaluate the vascular anatomy. Carotid stenosis measurements (when applicable) are obtained utilizing NASCET criteria, using the distal internal carotid diameter as the denominator. CONTRAST:  50 cc Isovue 370 intravenous COMPARISON:  Head CT from earlier today FINDINGS: CTA NECK FINDINGS Aortic arch: Mild atheromatous changes. Aberrant right subclavian artery. Common common carotid origin. Right carotid system: Overall mild volume of calcified atherosclerotic plaque mainly centered on the carotid bifurcation. No flow limiting stenosis, ulceration, or dissection. Left carotid system: Mild mainly calcified plaque at the carotid bifurcation without stenosis, ulceration, or dissection. Vertebral arteries: Mild borderline moderate narrowing at the left subclavian ostium, likely not affecting vertebral flow. Intermittent tortuosity of the vertebral arteries without flow limiting stenosis or signs of dissection. Prominent noncalcified atheromatous changes on the right subclavian artery beyond the thyrocervical trunk. Skeleton: No acute or aggressive finding. Other neck: No incidental mass or adenopathy. Upper chest: Innumerable  irregularly-shaped nodules in the apical lungs. Few emphysematous spaces. Review of the MIP images confirms the above findings CTA HEAD FINDINGS Anterior circulation: Carotid siphon atherosclerosis with moderate left cavernous segment stenosis. Smaller left ICA in the setting of aplastic left A1 segment. No major branch occlusion or flow limiting stenosis to explain symptoms. Posterior circulation: Symmetric vertebral arteries. There is severe focal mid basilar stenosis with irregular luminal appearance compatible with thrombus or clot. Downstream major branches are patent. Venous sinuses: Patent Anatomic variants: Aplastic left A1 segment Delayed phase: No parenchymal enhancement or mass P Critical Value/emergent results were called by telephone at the time of interpretation on 10/11/2016 at 3:28 pm to Dr. Rosalin Hawking , who verbally acknowledged these results. Review of the MIP images confirms the above findings IMPRESSION: 1. 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 No visualized acute infarct. 2. Cervical carotid atherosclerosis without flow limiting stenosis. Moderate left cavernous ICA atheromatous narrowing. 3. Mild borderline moderate  left subclavian origin stenosis. 4. Micronodules in the bilateral lungs, suspect respiratory bronchiolitis in this smoker. Emphysema is present. Full noncontrast chest CT recommended after convalescence. Electronically Signed   By: Monte Fantasia M.D.   On: 10/11/2016 15:35   Mr Brain Wo Contrast  Result Date: 10/11/2016 CLINICAL DATA:  71 y/o  F; stroke. EXAM: MRI HEAD WITHOUT CONTRAST TECHNIQUE: Multiplanar, multiecho pulse sequences of the brain and surrounding structures were obtained without intravenous contrast. COMPARISON:  10/11/2016 CT head and CT angiogram head and neck FINDINGS: Brain: Diffusion restriction within right paramedian pons and additional small foci in the right cerebellar hemisphere, right brachium pontis, left  pontomedullary junction, right occipital lobe, and right paramedian parietal lobe compatible with acute infarction. There is mild associated T2 FLAIR signal abnormality with the areas of infarction. Otherwise there is no significant T2 FLAIR signal abnormality of the brain. No abnormal susceptibility hypointensity to suggest intracranial hemorrhage is identified. No extra-axial collection, focal mass effect, or hydrocephalus. Vascular: Better assessed on prior CT angiogram. Skull and upper cervical spine: Normal marrow signal. Sinuses/Orbits: Negative. Other: None. IMPRESSION: Acute/early subacute infarction within the right Mercedes pons and multiple additional small foci in the right cerebellar hemisphere, right brachium pontis, left pontomedullary junction, right occipital lobe, and right paramedian parietal lobe. No hemorrhage identified. Electronically Signed   By: Kristine Garbe M.D.   On: 10/11/2016 17:17   Dg Chest Port 1 View  Result Date: 10/11/2016 CLINICAL DATA:  71 year old female with weakness and confusion. Subsequent encounter. EXAM: PORTABLE CHEST 1 VIEW COMPARISON:  10/10/2016 and 01/31/2015 chest x-ray. FINDINGS: No infiltrate, congestive heart failure or pneumothorax. No plain film evidence of pulmonary malignancy. Heart size top-normal. Tortuous aorta which is partially calcified. No acute osseous abnormality. Acromioclavicular joint degenerative changes. IMPRESSION: No active disease. Electronically Signed   By: Genia Del M.D.   On: 10/11/2016 11:44     Assessment/Plan: Diagnosis: bibasilar infarct 1. Does the need for close, 24 hr/day medical supervision in concert with the patient's rehab needs make it unreasonable for this patient to be served in a less intensive setting? Potentially 2. Co-Morbidities requiring supervision/potential complications: HTN, gout, post-stroke sequelae 3. Due to bladder management, bowel management, safety, skin/wound care, disease  management, medication administration, pain management and patient education, does the patient require 24 hr/day rehab nursing? Yes 4. Does the patient require coordinated care of a physician, rehab nurse, PT (1-2 hrs/day, 5 days/week), OT (1-2 hrs/day, 5 days/week) and SLP (1-2 hrs/day, 5 days/week) to address physical and functional deficits in the context of the above medical diagnosis(es)? Yes and Potentially Addressing deficits in the following areas: balance, endurance, locomotion, strength, transferring, bowel/bladder control, bathing, dressing, feeding, grooming, speech, swallowing and psychosocial support 5. Can the patient actively participate in an intensive therapy program of at least 3 hrs of therapy per day at least 5 days per week? Yes 6. The potential for patient to make measurable gains while on inpatient rehab is excellent 7. Anticipated functional outcomes upon discharge from inpatient rehab are modified independent and supervision  with PT, modified independent and supervision with OT, modified independent with SLP. 8. Estimated rehab length of stay to reach the above functional goals is: potentially 7 days 9. Does the patient have adequate social supports and living environment to accommodate these discharge functional goals? Yes 10. Anticipated D/C setting: Home 11. Anticipated post D/C treatments: HH therapy and Outpatient therapy 12. Overall Rehab/Functional Prognosis: excellent  RECOMMENDATIONS:  This patient's condition is appropriate for continued  rehabilitative care in the following setting: CIR Patient has agreed to participate in recommended program. Yes Note that insurance prior authorization may be required for reimbursement for recommended care.  Comment: Likely will need inpatient rehab, but I would like to see her progress with PT/OT over the next 24 hours.  Rehab Admissions Coordinator to follow up.  Thanks,  Meredith Staggers, MD,  Mellody Drown    Cathlyn Parsons., PA-C 10/12/2016    Electronically signed by Meredith Staggers, MD at 10/12/2016 4:05 PM      ED to Hosp-Admission (Discharged) on 10/11/2016        Revision & Routing History        Detailed Report

## 2016-10-15 NOTE — Progress Notes (Signed)
Retta Diones, RN Rehab Admission Coordinator Signed Physical Medicine and Rehabilitation  PMR Pre-admission Date of Service: 10/13/2016 2:47 PM  Related encounter: ED to Hosp-Admission (Discharged) from 10/11/2016 in [place]       [] Hide copied text PMR Admission Coordinator Pre-Admission Assessment  Patient: Mercedes Gardner is an 71 y.o., female MRN: [id number] DOB: Apr 07, 1946 Height: 5\' 5"  (165.1 cm) Weight: 56 kg (123 lb 7.3 oz)                                                                                                                                                  Insurance Information HMO:     PPO:       PCP:       IPA:       80/20:       OTHER:   PRIMARY:  Medicare A/B      Policy#:  [id number]      Subscriber: Mercedes Gardner CM Name:        Phone#:       Fax#:   Pre-Cert#:        Employer:  Retired, works PT Benefits:  Phone #:       Name: Checked in [employer]. Date:  06/13/13     Deduct:  $1340      Out of Pocket Max:  none      Life Max: unlimited CIR: 100%      SNF: 100 days Outpatient: 80%     Co-Pay: 20% Home Health: 100%      Co-Pay: none DME: 80%     Co-Pay: 20% Providers: patient's choice  SECONDARY: Medicaid [place] access      Policy#:  [id number]      Subscriber:  Mercedes Gardner CM Name:        Phone#:       Fax#:   Pre-Cert#:        Employer:   Benefits:  Phone #: [phone]     Name:  Automated Eff. Date:  Eligible 10/13/16 with MAAQY coverage code     Deduct:        Out of Pocket Max:        Life Max:   CIR:        SNF:   Outpatient:       Co-Pay:   Home Health:        Co-Pay:   DME:       Co-Pay:    Emergency Contact Information        Contact Information    Name Relation Home Work [place] Mother [phone]     Teneka, Baton Daughter [phone]  [phone]   Eleaner, Baldner Daughter [phone]  [phone]   Crystan, Addesso [phone]       Current Medical History    Patient Admitting  Diagnosis: Bibasilar infarct  History of Present Illness: A 71 y.o.right handed femalewith history of hypertension, hyperlipidemia and tobacco abuse. Presented 10/10/2016 with waxing and waning symptoms of intermittent slurred speechand left-sided weakness. Noted 20 pound weight loss since September and poor eating habits.Per chart review patient lives with son independent prior to admission. 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 Lower extremity Dopplers negative for DVT. Carotid Doppler showed no ICA stenosis. Patient did not receive TPA.Echocardiogram with ejection fraction of 65% and grade 2 diastolic dysfunction. No regional wall motion abnormalities.CT chest abdomen pelvis completed for history of recent weight loss showing no evidence of malignancy. There was some incidental findings of pulmonary nodules recommend CT repeat in 6-12 months. 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 Patient currently completing a course of Augmentin for suspect sinusitis and chest x-ray was completed that was negative. Bouts of hypokalemia 2.6 corrected with potassium supplement.Physicaland occupationaltherapy evaluation completed with recommendations of physical medicine rehabilitation consult. Patient to be admitted for a comprehensive inpatient rehabilitation program.    Total: 5=NIH  Past Medical  History      Past Medical History:  Diagnosis Date  . Anemia   . Anxiety   . Arthritis    knees   . Dysrhythmia    heart skips a beat   . GERD (gastroesophageal reflux disease)   . Gout   . Hyperlipidemia April 2014  . Hypertension   . Shortness of breath dyspnea    due to pressure of cyst per patient     Family History  family history includes Cancer in her mother; Diabetes in her mother and sister; Hypertension in her mother and sister.  Prior Rehab/Hospitalizations: No previous rehab admissions.  Has the patient had major surgery during 100 days prior to admission? No  Current Medications   Current Facility-Administered Medications:  .   stroke: mapping our early stages of recovery book, , Does not apply, Once, Rosalin Hawking, MD .  acetaminophen (TYLENOL) tablet 650 mg, 650 mg, Oral, Q4H PRN, 650 mg at 10/13/16 2352 **OR** [DISCONTINUED] acetaminophen (TYLENOL) solution 650 mg, 650 mg, Per Tube, Q4H PRN **OR** [DISCONTINUED] acetaminophen (TYLENOL) suppository 650 mg, 650 mg, Rectal, Q4H PRN, Samella Parr, NP .  amoxicillin-clavulanate (AUGMENTIN) 875-125 MG per tablet 1 tablet, 1 tablet, Oral, Q12H, Maryann Mikhail, DO, 1 tablet at 10/13/16 2312 .  atorvastatin (LIPITOR) tablet 80 mg, 80 mg, Oral, q1800, Rosalin Hawking, MD, 80 mg at 10/13/16 1655 .  enoxaparin (LOVENOX) injection 55 mg, 1 mg/kg, Subcutaneous, Q12H, Rebecka Apley, RPH .  feeding supplement (ENSURE ENLIVE) (ENSURE ENLIVE) liquid 237 mL, 237 mL, Oral, BID BM, Rosalin Hawking, MD, 237 mL at 10/13/16 1400 .  ferrous sulfate tablet 325 mg, 325 mg, Oral, BID WC, Maryann Mikhail, DO, 325 mg at 10/13/16 1655 .  fluticasone (FLONASE) 50 MCG/ACT nasal spray 2 spray, 2 spray, Each Nare, BID, Samella Parr, NP, 2 spray at 10/13/16 2312 .  folic acid (FOLVITE) tablet 1 mg, 1 mg, Oral,  Daily, Rosalin Hawking, MD, 1 mg at 10/13/16 0901 .  iopamidol (ISOVUE-M) 61 % intrathecal injection 15 mL, 15 mL, Intrathecal, Once  PRN, Rosalin Hawking, MD .  LORazepam (ATIVAN) tablet 1 mg, 1 mg, Oral, Q6H PRN **OR** LORazepam (ATIVAN) injection 1 mg, 1 mg, Intravenous, Q6H PRN, Rosalin Hawking, MD .  multivitamin with minerals tablet 1 tablet, 1 tablet, Oral, Daily, Rosalin Hawking, MD, 1 tablet at 10/13/16 0901 .  nicotine (NICODERM CQ - dosed in mg/24 hours) patch 21 mg, 21 mg, Transdermal, Daily, Samella Parr, NP .  senna-docusate (Senokot-S) tablet 1 tablet, 1 tablet, Oral, QHS PRN, Rosalin Hawking, MD .  thiamine (VITAMIN B-1) tablet 100 mg, 100 mg, Oral, Daily, 100 mg at 10/13/16 0901 **OR** [DISCONTINUED] thiamine (B-1) injection 100 mg, 100 mg, Intravenous, Daily, Rosalin Hawking, MD .  warfarin (COUMADIN) tablet 5 mg, 5 mg, Oral, ONCE-1800, Rachel L Rumbarger, RPH .  Warfarin - Pharmacist Dosing Inpatient, , Does not apply, q1800, Rebecka Apley, [facility]  Patients Current Diet: Diet Heart Room service appropriate? Yes; Fluid consistency: Thin  Precautions / Restrictions Precautions Precautions: Fall Restrictions Weight Bearing Restrictions: No   Has the patient had 2 or more falls or a fall with injury in the past year?No  Prior Activity Level Community (5-7x/wk): Went out daily.  Was active.  Did not drive.  Worked PT at childcare center.  Home Assistive Devices / Equipment Home Assistive Devices/Equipment: Eyeglasses Home Equipment: Crutches  Prior Device Use: Indicate devices/aids used by the patient prior to current illness, exacerbation or injury? None  Prior Functional Level Prior Function Level of Independence: Independent Comments: Works as Print production planner. Very active   Self Care: Did the patient need help bathing, dressing, using the toilet or eating?  Independent  Indoor Mobility: Did the patient need assistance with walking from room to room (with or without device)? Independent  Stairs: Did the patient need assistance with internal or external stairs (with or without device)?  Independent  Functional Cognition: Did the patient need help planning regular tasks such as shopping or remembering to take medications? Independent  Current Functional Level Cognition  Overall Cognitive Status: Impaired/Different from baseline Current Attention Level: Selective (pt easily self distracts requiring cues to redirect her ) Orientation Level: Oriented X4 Following Commands: Follows one step commands consistently Safety/Judgement: Decreased awareness of safety General Comments: Pt is impulsive.  She quickly jumps from topic to topic self distracting.  She requires mod cues to redirect to task.  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  He is able to opose digits 3 on Rt hand  LUE Sensation: decreased light touch LUE Coordination: decreased fine motor  Lower Extremity Assessment: Defer to PT evaluation LLE Deficits / Details: Grossly ~3-4/5 throughout, some incoordination. LLE Sensation: decreased light touch LLE Coordination: decreased fine motor, decreased gross motor    ADLs  Overall ADL's : Needs assistance/impaired Eating/Feeding: Set up, Sitting Grooming: Wash/dry hands, Wash/dry face, Oral care, Brushing hair, Minimal assistance, Standing Upper Body Bathing: Supervision/ safety, Sitting Lower Body Bathing: Minimal assistance, Sit to/from stand Lower Body Bathing Details (indicate cue type and reason): assist for balance  Upper Body Dressing : Minimal assistance, Sitting Lower Body Dressing: Minimal assistance, Sit to/from stand Lower Body Dressing Details (indicate cue type and reason): min A to pull socks over feet and  for balance  Toilet Transfer: Minimal assistance, Stand-pivot, BSC Toileting- Clothing Manipulation and Hygiene: Minimal assistance, Sit  to/from stand Functional mobility during ADLs: Minimal assistance, Rolling walker, +2 for safety/equipment    Mobility  Overal bed mobility: Needs Assistance Bed Mobility: Supine to Sit, Sit to Supine Supine to sit: Min guard Sit to supine: Min guard General bed mobility comments: increased time and effort     Transfers  Overall transfer level: Needs assistance Equipment used: 1 person hand held assist Transfers: Sit to/from Stand, Stand Pivot Transfers Sit to Stand: Min assist Stand pivot transfers: Min assist General transfer comment: min facilitation for balance     Ambulation / Gait / Stairs / Wheelchair Mobility  Ambulation/Gait Ambulation/Gait assistance: Mod assist Ambulation Distance (Feet): 50 Feet Assistive device: 1 person hand held assist Gait Pattern/deviations: Step-through pattern, Decreased stride length, Decreased dorsiflexion - left, Scissoring, Ataxic General Gait Details: pt with some ataxia and decreased proprioception.  pt at times stepping on her own foot without realization.  pt's L knee weak and with instability in stance.      Posture / Balance Dynamic Sitting Balance Sitting balance - Comments: pt begins to drift to R, but is able to self correct without cues.   Balance Overall balance assessment: Needs assistance Sitting-balance support: Feet supported, No upper extremity supported Sitting balance-Leahy Scale: Fair Sitting balance - Comments: pt begins to drift to R, but is able to self correct without cues.   Standing balance support: During functional activity, Single extremity supported Standing balance-Leahy Scale: Poor Standing balance comment: requires min A and UE support     Special needs/care consideration BiPAP/CPAP No CPM No Continuous Drip IV Yes, heparin drip  Dialysis No        Life Vest No Oxygen No Special Bed No Trach Size No Wound Vac (area) No       Skin No                            Bowel mgmt: Last BM  10/12/16 Bladder mgmt: Voiding in bathroom with assistance Diabetic mgmt No    Previous Home Environment Living Arrangements: Children  Lives With: Son Available Help at Discharge: Family Type of Home: House Home Layout: One level Home Access: Stairs to enter Technical brewer of Steps: 3 Home Care Services: No  Discharge Living Setting Plans for Discharge Living Setting: House, Lives with (comment) (Lives with 50 yo son.) Type of Home at Discharge: House Discharge Home Layout: One level Discharge Home Access: Stairs to enter Entrance Stairs-Number of Steps: 3 Does the patient have any problems obtaining your medications?: No  Social/Family/Support Systems Patient Roles: Parent (Has a son and a daughter.) Contact Information: Krisinda Tencza - son - [phone] Anticipated Caregiver: Son and self Ability/Limitations of Caregiver: Son works from home.  Daughter lives in [place]. Caregiver Availability: Intermittent Discharge Plan Discussed with Primary Caregiver: Yes Is Caregiver In Agreement with Plan?: Yes Does Caregiver/Family have Issues with Lodging/Transportation while Pt is in Rehab?: No  Goals/Additional Needs Patient/Family Goal for Rehab: PT/OT mod I and supervision, SLP mod I goals Expected length of stay: ? 7 days Cultural Considerations: [employer].  Has bible studay on Saturdays with her daughter by phone.  Dtr lives in [place]. Dietary Needs: Heart diet, thin liquids Equipment Needs: TBD Pt/Family Agrees to Admission and willing to participate: Yes Program Orientation Provided & Reviewed with Pt/Caregiver Including Roles  & Responsibilities: Yes  Decrease burden of  Care through IP rehab admission: N/A  Possible need for SNF placement upon discharge: Not anticipated  Patient Condition: This patient's condition remains as documented in the consult dated 10/12/16, in which the Rehabilitation Physician determined and documented that the patient's  condition is appropriate for intensive rehabilitative care in an inpatient rehabilitation facility. Will admit to inpatient rehab today.  Preadmission Screen Completed By:  Retta Diones, 10/14/2016 10:34 AM ______________________________________________________________________   Discussed status with Dr. Letta Pate on 10/14/16 at 1035 and received telephone approval for admission today.  Admission Coordinator:  Retta Diones, time 1035/Date 10/14/16       Cosigned by:

## 2016-10-16 ENCOUNTER — Inpatient Hospital Stay (HOSPITAL_COMMUNITY): Payer: Medicare Other | Admitting: Occupational Therapy

## 2016-10-16 ENCOUNTER — Inpatient Hospital Stay (HOSPITAL_COMMUNITY): Payer: Medicare Other

## 2016-10-16 ENCOUNTER — Inpatient Hospital Stay (HOSPITAL_COMMUNITY): Payer: Medicare Other | Admitting: Physical Therapy

## 2016-10-16 LAB — PROTIME-INR
INR: 1.42
PROTHROMBIN TIME: 17.4 s — AB (ref 11.4–15.2)

## 2016-10-16 MED ORDER — WARFARIN SODIUM 2 MG PO TABS
4.0000 mg | ORAL_TABLET | Freq: Once | ORAL | Status: AC
  Administered 2016-10-16: 4 mg via ORAL
  Filled 2016-10-16: qty 2

## 2016-10-16 NOTE — Progress Notes (Signed)
Subjective/Complaints: No issues overnite Still worries about another stroke, we discussed lifestyle issues as well as importance of medical compliance  ROS Neg CP, SOB, N/V/D Objective: Vital Signs: Blood pressure 120/78, pulse 88, temperature 98.8 F (37.1 C), temperature source Oral, resp. rate 18, SpO2 100 %. No results found. Results for orders placed or performed during the hospital encounter of 10/14/16 (from the past 72 hour(s))  CBC WITH DIFFERENTIAL     Status: Abnormal   Collection Time: 10/15/16  5:05 AM  Result Value Ref Range   WBC 8.5 4.0 - 10.5 K/uL   RBC 3.82 (L) 3.87 - 5.11 MIL/uL   Hemoglobin 11.4 (L) 12.0 - 15.0 g/dL   HCT 36.1 36.0 - 46.0 %   MCV 94.5 78.0 - 100.0 fL   MCH 29.8 26.0 - 34.0 pg   MCHC 31.6 30.0 - 36.0 g/dL   RDW 13.2 11.5 - 15.5 %   Platelets 224 150 - 400 K/uL   Neutrophils Relative % 73 %   Neutro Abs 6.3 1.7 - 7.7 K/uL   Lymphocytes Relative 22 %   Lymphs Abs 1.8 0.7 - 4.0 K/uL   Monocytes Relative 3 %   Monocytes Absolute 0.3 0.1 - 1.0 K/uL   Eosinophils Relative 2 %   Eosinophils Absolute 0.2 0.0 - 0.7 K/uL   Basophils Relative 0 %   Basophils Absolute 0.0 0.0 - 0.1 K/uL  Comprehensive metabolic panel     Status: Abnormal   Collection Time: 10/15/16  5:05 AM  Result Value Ref Range   Sodium 139 135 - 145 mmol/L   Potassium 3.5 3.5 - 5.1 mmol/L   Chloride 104 101 - 111 mmol/L   CO2 26 22 - 32 mmol/L   Glucose, Bld 170 (H) 65 - 99 mg/dL   BUN 15 6 - 20 mg/dL   Creatinine, Ser 0.81 0.44 - 1.00 mg/dL   Calcium 10.1 8.9 - 10.3 mg/dL   Total Protein 6.7 6.5 - 8.1 g/dL   Albumin 3.3 (L) 3.5 - 5.0 g/dL   AST 38 15 - 41 U/L   ALT 32 14 - 54 U/L   Alkaline Phosphatase 93 38 - 126 U/L   Total Bilirubin 0.4 0.3 - 1.2 mg/dL   GFR calc non Af Amer >60 >60 mL/min   GFR calc Af Amer >60 >60 mL/min    Comment: (NOTE) The eGFR has been calculated using the CKD EPI equation. This calculation has not been validated in all clinical  situations. eGFR's persistently <60 mL/min signify possible Chronic Kidney Disease.    Anion gap 9 5 - 15  Protime-INR     Status: None   Collection Time: 10/15/16  5:05 AM  Result Value Ref Range   Prothrombin Time 13.9 11.4 - 15.2 seconds   INR 1.06   Protime-INR     Status: Abnormal   Collection Time: 10/16/16  2:53 AM  Result Value Ref Range   Prothrombin Time 17.4 (H) 11.4 - 15.2 seconds   INR 1.42      HEENT: normal Cardio: RRR and no murmur Resp: CTA B/L and unlabored GI: BS positive and NT, ND Extremity:  Pulses positive and No Edema Skin:   Intact Neuro: Alert/Oriented, Normal Sensory, Abnormal Motor 3/5 LUE, 4- LLE, 5/5 on RIght side, Abnormal FMC Ataxic/ dec FMC and Dysarthric Musc/Skel:  Normal Gen NAD   Assessment/Plan: 1. 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 Physiatrist is providing close team supervision and 24 hour management of active medical problems listed below. Physiatrist and rehab team continue to assess barriers to discharge/monitor patient progress toward functional and medical goals. FIM: Function - Bathing Position: Shower Body parts bathed by patient: Right arm, Left arm, Chest, Abdomen, Front perineal area, Right upper leg, Left upper leg, Right lower leg, Left lower leg Body parts bathed by helper: Buttocks Bathing not applicable: Back Assist Level: Touching or steadying assistance(Pt > 75%)  Function- Upper Body Dressing/Undressing What is the patient wearing?: Bra, Pull over shirt/dress Bra - Perfomed by patient: Thread/unthread right bra strap, Thread/unthread left bra strap, Hook/unhook bra (pull down sports bra) Pull over shirt/dress - Perfomed by patient: Thread/unthread right sleeve, Thread/unthread left sleeve, Put head  through opening, Pull shirt over trunk Assist Level: Touching or steadying assistance(Pt > 75%) Function - Lower Body Dressing/Undressing What is the patient wearing?: Pants, Non-skid slipper socks, Ted Hose Position: Sitting EOB Pants- Performed by patient: Thread/unthread right pants leg, Thread/unthread left pants leg, Pull pants up/down Non-skid slipper socks- Performed by patient: Don/doff right sock, Don/doff left sock TED Hose - Performed by helper: Don/doff right TED hose, Don/doff left TED hose Assist for footwear: Supervision/touching assist Assist for lower body dressing: Touching or steadying assistance (Pt > 75%)  Function - Toileting Toileting steps completed by patient: Adjust clothing prior to toileting, Performs perineal hygiene, Adjust clothing after toileting Toileting Assistive Devices: Grab bar or rail Assist level: Supervision or verbal cues  Function Midwife transfer assistive device: Grab bar Assist level to toilet: Touching or steadying assistance (Pt > 75%) Assist level from toilet: Touching or steadying assistance (Pt > 75%)  Function - Chair/bed transfer Chair/bed transfer method: Ambulatory, Stand pivot Chair/bed transfer assist level: Touching or steadying assistance (Pt > 75%) Chair/bed transfer assistive device: Armrests, Walker  Function - Locomotion: Wheelchair Will patient use wheelchair at discharge?: No Wheelchair activity did not occur: N/A Function - Locomotion: Ambulation Assistive device: Hand held assist, Walker-rolling Max distance: 150 Assist level: Moderate assist (Pt 50 - 74%) Assist level: Moderate assist (Pt 50 - 74%) Assist level: Moderate assist (Pt 50 - 74%) Assist level: Moderate assist (Pt 50 - 74%) Assist level: Moderate assist (Pt 50 - 74%)  Function - Comprehension Comprehension: Auditory Comprehension assist level: Understands complex 90% of the time/cues 10% of the time  Function -  Expression Expression: Verbal Expression assist level: Expresses basic needs/ideas: With extra time/assistive device  Function - Social Interaction Social Interaction assist level: Interacts appropriately 75 - 89% of the time - Needs redirection for appropriate language or to initiate interaction.  Function - Problem Solving Problem solving assist level: Solves basic problems with no assist  Function - Memory Memory assist level: Requires cues to use assistive device Patient normally able to recall (first 3 days only): Current season, Staff names and faces, That he or she is in a hospital  Medical Problem List and Plan:  1. 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 DVT Prophylaxis/Anticoagulation: Coumadin therapy with Lovenox for bridging. Monitor for any signs of bleeding. INR goal 2-3. INR increasing 1.4 Plan repeat CTA of head and neck 2-3 months to decide duration of anticoagulation use  3. Pain Management: Tylenol as needed , hx of R knee  pain, gets injections with Dr Ninfa Linden, add voltaren 4. Mood: Provide emotional support  5. Neuropsych: This patient is capable of making decisions on her own behalf.  6. Skin/Wound Care: Routine skin checks  7. Fluids/Electrolytes/Nutrition: Routine I&O with follow-up chemistries  8. Hypertension. Permissive hypertension. Patient on Norvasc 10 mg daily prior to admission.no need to resume presently     Vitals:   10/14/16 1822 10/15/16 0355  BP: 136/66 109/63  Pulse: 86 72  Resp: 18 17  Temp: 97.9 F (36.6 C) 98.6 F (37 C)   9. Tobacco abuse. Nicoderm patch. Provide counseling  10. Sinusitis. Complete course of Augmentin.  11. Hyperlipidemia. Lipitor  12. Decreased nutritional storage. TSH 0.451. Dietary supplements. Dietary consult  13. Leukocytosis. Resolved. Follow-up CBC  14. Hypokalemia.  Follow-up chemistries  15. Normocytic anemia. Continue iron supplementation Hgb mildly reduced at 11.4  16. History of gout. Monitor for any signs of flareup  17. Constipation. Laxative assistance  18.  Mild hypoalbuminemia, prostat   LOS (Days) 2 A FACE TO FACE EVALUATION WAS PERFORMED  KIRSTEINS,ANDREW E 10/16/2016, 6:31 AM

## 2016-10-16 NOTE — Progress Notes (Signed)
ANTICOAGULATION CONSULT NOTE - Follow Up Consult  Pharmacy Consult for lovenox and coumadin Indication: basilar artery thrombosis  Allergies  Allergen Reactions  . Tramadol Other (See Comments)    Per pt, she got depressed, moody, and had increased back pain when she took tramadol   Vital Signs: Temp: 98.8 F (37.1 C) (02/03 0455) Temp Source: Oral (02/03 0455) BP: 120/78 (02/03 0455) Pulse Rate: 88 (02/03 0455)  Labs:  Recent Labs  10/14/16 0402 10/15/16 0505 10/16/16 0253  HGB 11.1* 11.4*  --   HCT 35.2* 36.1  --   PLT 220 224  --   LABPROT 13.1 13.9 17.4*  INR 0.99 1.06 1.42  CREATININE 0.83 0.81  --     Estimated Creatinine Clearance (by C-G formula based on SCr of 0.81 mg/dL) Female: 58.8 mL/min Female: 69.1 mL/min   Medications:  Scheduled:  . amoxicillin-clavulanate  1 tablet Oral Q12H  . atorvastatin  80 mg Oral q1800  . busPIRone  5 mg Oral BID  . diclofenac sodium  2 g Topical QID  . enoxaparin (LOVENOX) injection  1 mg/kg Subcutaneous Q12H  . feeding supplement (PRO-STAT SUGAR FREE 64)  30 mL Oral BID  . ferrous sulfate  325 mg Oral BID WC  . fluticasone  2 spray Each Nare BID  . folic acid  1 mg Oral Daily  . multivitamin with minerals  1 tablet Oral Daily  . nicotine  21 mg Transdermal Daily  . pantoprazole  40 mg Oral Daily  . thiamine  100 mg Oral Daily  . 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 INR increasing, but remains subtherapeutic 1.06 >1.42. Hgb 11.4, plt normal. No s/s bleeding noted. No changes in renal function.   Goal of Therapy:  Anti-Xa level 0.6-1 units/ml 4hrs after LMWH dose given; INR 2-3 Monitor platelets by anticoagulation protocol: Yes   Plan:  Continue lovenox 1mg /kg subcutaneously q12h Warfarin 4mg  tonight x1 Daily INR/CBC Monitor s/sx of  bleeding  Argie Ramming, PharmD Pharmacy Resident  Pager [phone] 10/16/16 9:51 AM

## 2016-10-16 NOTE — Progress Notes (Addendum)
Occupational Therapy Session Note  Patient Details  Name: Mercedes Gardner MRN: [id number] Date of Birth: 03-29-1946  Today's Date: 10/16/2016 OT Individual Time: 1115-1200 OT Individual Time Calculation (min): 45 min    Short Term Goals: Week 1:  OT Short Term Goal 1 (Week 1): STG = LTGs due to ELOS  Skilled Therapeutic Interventions/Progress Updates:    Pt seen for OT session focusing on functional activity tolerance/ ambulation, functional transfers, and kitchen mobility. Pt sitting up in recliner upon arrival with RN present, pt agreeable to tx session and denying pain. Throughout session she ambulated with CGA using RW. Verbal and visual cues provided throughout session to widen stance during ambulation for increased BOS. Pt requiring min VCs for awareness to objects on L side when ambulating. In ADL apartment, she completed simulated tub/shower transfer utilizing tub bench. Completed with min A using grab bars. Pt used UE to assist with brining L LE over tub wall. Pt voiced feeling this option will work best for home.  Seated rst breaks provided throughout session on low soft surface couch which pt was able to stand from with supervision.  Completed kitchen mobility, practicing retrieving and replacing items from overhead counter. Pt used L UE to complete task for strengthening/ coordination, completed with increased time and able to lift weighted objects with L UE.  She then completed simulated task making pot of coffee. Reviewed different ways to transport items in kitchen while also managing RW and functional ambulation with RW. Pt returned to room at end of session, completed toileting task with steadying assist. Pt left seated in recliner at end of session, set-up with lunch and all needs in reach.  Education provided during rest breaks about stroke risk factors and lifestyle changes to make to assist with reducing stroke risks.   Therapy Documentation Precautions:   Precautions Precautions: Fall Precaution Comments: ataxic gait, L hemiparesis Restrictions Weight Bearing Restrictions: No  See Function Navigator for Current Functional Status.   Therapy/Group: Individual Therapy  Lewis, Leandrea Ackley C 10/16/2016, 6:46 AM

## 2016-10-16 NOTE — Progress Notes (Signed)
Occupational Therapy Session Note  Patient Details  Name: Mercedes Gardner MRN: [id number] Date of Birth: 08-Sep-1946  Today's Date: 10/16/2016 OT Individual Time: 0900-1000 OT Individual Time Calculation (min): 60 min    Short Term Goals: Week 1:  OT Short Term Goal 1 (Week 1): STG = LTGs due to ELOS  Skilled Therapeutic Interventions/Progress Updates: ADL-retraining at shower level with focus on transfers and improved [facility] of left hand during BADL.    Pt received seated in w/c awaiting therapist.   With setu to provide RW, steadying assist while ambulating, and min vc to correct gait deviation (scissor gait), pt ambulated to bathroom and completed transfer to tub bench.   Pt required min vc to slow down her pace and to sequence through bathing/dressing tasks but completed bathing with only min assist to wash her back and standby assist while standing to wash her buttocks.   Pt dried on tub bench and w/c in bathroom and was escorted to sink to dress, using sink and RW to stabilize while standing to pull up her pants.   Pt required min assist to problem-solve and setup to don TEDs.   Pt left in w/c at end of session with all needs within reach.  Therapy Documentation Precautions:  Precautions Precautions: Fall Precaution Comments: ataxic gait, L hemiparesis Restrictions Weight Bearing Restrictions: No     Vital Signs: Therapy Vitals Temp: 98 F (36.7 C) Temp Source: Oral Pulse Rate: 90 Resp: 18 BP: 120/64 Patient Position (if appropriate): Sitting Oxygen Therapy SpO2: 100 % O2 Device: Not Delivered   Pain: Mild discomfort at left shoulder; advised to contact RN to provide prescribed analgesic lotion.     See Function Navigator for Current Functional Status.   Therapy/Group: Individual Therapy   Second session: Time: 1300-1400 Time Calculation (min): 60   min  Pain Assessment: No/denies pain  Skilled Therapeutic Interventions: Therapeutic activity with focus on dynamic  standing balance, improved safety awareness, functional mobility using RW.   Pt received seated in w/c and requesting assist to toilet.   With setup to provide RW and steadying assist during functional mobility, pt ambulated to bathroom and completed transfer and all steps of toileting with only supervision for safety.  Pt requires cues to ambulate with good posture and to avoid scissor gait pattern.   Pt then ambulated to ADL apartment and was educated on use of Nintendo Wii, Wii Comptroller, and Wii Fit plus prgram as appropriate home exercise program to address balance deficits as pt claims to have access to [employer] console.   Pt completed center of balance assessment, body test, and 4 balance games without rest break using RW, steadying assist and mod vc to interpret game prompts correctly.   Pt requires repeated vc to lean through trunk versus tilting her head during tasks.   Pt then ambulated back to her room and tranferred to recliner with standby assist for safety and min vc to lower with her arms.   See FIM for current functional status  Therapy/Group: Individual Therapy  [place] 10/16/2016, 2:05 PM

## 2016-10-16 NOTE — Progress Notes (Signed)
Physical Therapy Session Note  Patient Details  Name: Mercedes Gardner MRN: [id number] Date of Birth: [birth year]  Today's Date: 10/16/2016 PT Individual Time: 1500-1530 PT Individual Time Calculation (min): 30 min   Short Term Goals: Week 1:  PT Short Term Goal 1 (Week 1): =LTGs due to ELOS  Skilled Therapeutic Interventions/Progress Updates:  Pt was seen bedside in the pm. Pt performed multiple sit to stand transfers with min A and verbal cues. Pt ambulated 150 feet with rolling walker and min guard to min A. Pt ambulated 200 feet x 2 and 150 feet with min A and verbal cues, no assistive device. Pt returned to room. Pt transferred edge of bed to supine with S and verbal cues. Pt left sitting up in bed with call bell within reach.   Therapy Documentation Precautions:  Precautions Precautions: Fall Precaution Comments: ataxic gait, L hemiparesis Restrictions Weight Bearing Restrictions: No General:   Pain: No c/o pain.   See Function Navigator for Current Functional Status.   Therapy/Group: Individual Therapy  Dub Amis 10/16/2016, 3:54 PM

## 2016-10-17 ENCOUNTER — Inpatient Hospital Stay (HOSPITAL_COMMUNITY): Payer: Medicare Other | Admitting: Physical Therapy

## 2016-10-17 ENCOUNTER — Inpatient Hospital Stay (HOSPITAL_COMMUNITY): Payer: Medicare Other | Admitting: Occupational Therapy

## 2016-10-17 LAB — PROTIME-INR
INR: 1.51
PROTHROMBIN TIME: 18.3 s — AB (ref 11.4–15.2)

## 2016-10-17 MED ORDER — WARFARIN SODIUM 5 MG PO TABS
5.0000 mg | ORAL_TABLET | Freq: Once | ORAL | Status: AC
  Administered 2016-10-17: 5 mg via ORAL
  Filled 2016-10-17: qty 1

## 2016-10-17 MED ORDER — BUSPIRONE HCL 15 MG PO TABS
7.5000 mg | ORAL_TABLET | Freq: Two times a day (BID) | ORAL | Status: DC
  Administered 2016-10-17 – 2016-10-22 (×11): 7.5 mg via ORAL
  Filled 2016-10-17 (×11): qty 1

## 2016-10-17 MED ORDER — TOPIRAMATE 25 MG PO TABS
25.0000 mg | ORAL_TABLET | Freq: Every day | ORAL | Status: DC
  Administered 2016-10-17 – 2016-10-22 (×6): 25 mg via ORAL
  Filled 2016-10-17 (×6): qty 1

## 2016-10-17 NOTE — IPOC Note (Signed)
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)   Hemiparesis affecting left side as late effect of stroke ([place])   Gait disturbance, post-stroke     Functional Problem List: Nursing Pain, Endurance  PT Balance, Endurance, Motor, Perception, Safety  OT Balance, Cognition, Endurance, Motor, Pain, Perception, Safety, Vision  SLP Cognition  TR         Basic ADL's: OT Eating, Grooming, Bathing, Dressing, Toileting     Advanced  ADL's: OT Simple Meal Preparation, Laundry     Transfers: PT Bed Mobility, Bed to Chair, Musician, Manufacturing systems engineer, Metallurgist: PT Stairs, Ambulation     Additional Impairments: OT Fuctional Use of Upper Extremity  SLP Communication expression    TR      Anticipated Outcomes Item Anticipated Outcome  Self Feeding Mod I  Swallowing      Basic self-care  Mod I  Toileting  Mod I   Bathroom Transfers Mod I - supervision  Bowel/Bladder  Mod I  Transfers  mod I  Locomotion  mod I household, supervision community  Communication  Min A  Cognition  Min A  Pain  <3  Safety/Judgment  Supervision   Therapy Plan: PT Intensity: Minimum of 1-2 x/day ,45 to 90 minutes PT Frequency: 5 out of 7 days PT Duration Estimated Length of Stay: 7-9 days OT Intensity: Minimum of 1-2 x/day, 45 to 90 minutes OT Frequency: 5 out of 7 days OT Duration/Estimated Length of Stay: 7-9 days SLP Intensity: Minumum of 1-2 x/day, 30 to 90 minutes SLP Frequency: 3 to 5 out of 7 days SLP Duration/Estimated Length of Stay: 7 to 9 days       Team Interventions: Nursing Interventions Disease Management/Prevention, Pain Management, Medication Management, Discharge Planning  PT interventions Ambulation/gait training, Balance/vestibular  training, Community reintegration, Cognitive remediation/compensation, Discharge planning, Disease management/prevention, Functional electrical stimulation, DME/adaptive equipment instruction, Functional mobility training, Neuromuscular re-education, Patient/family education, Psychosocial support, Stair training, Splinting/orthotics, Therapeutic Activities, Therapeutic Exercise, UE/LE Coordination activities, UE/LE Strength taining/ROM, Visual/perceptual remediation/compensation  OT Interventions Training and development officer, Cognitive remediation/compensation, Community reintegration, Discharge planning, Disease mangement/prevention, DME/adaptive equipment instruction, Functional mobility training, Neuromuscular re-education, Pain management, Patient/family education, Psychosocial support, Self Care/advanced ADL retraining, Therapeutic Activities, Therapeutic Exercise, UE/LE Strength taining/ROM, Splinting/orthotics, UE/LE Coordination activities, Visual/perceptual remediation/compensation  SLP Interventions Cueing hierarchy, Cognitive remediation/compensation, Therapeutic Activities, Patient/family education, Medication managment, Functional tasks  TR Interventions    SW/CM Interventions Discharge Planning, Psychosocial Support, Patient/Family Education    Team Discharge Planning: Destination: PT-Home ,OT- Home , SLP-Home Projected Follow-up: PT-Home health PT, OT-  Home health OT, Outpatient OT, SLP-Outpatient SLP Projected Equipment Needs: PT-To be determined, OT- Tub/shower bench, To be determined, SLP-None recommended by SLP Equipment Details: PT- , OT-  Patient/family involved in discharge planning: PT- Patient,  OT-Patient, SLP-Patient  MD ELOS: 10-14d Medical Rehab Prognosis:  Good Assessment:  71 y.o. right handed female with history of hypertension, hyperlipidemia and tobacco abuse. Presented 10/10/2016 with waxing and waning symptoms of intermittent slurred speech and left-sided  weakness.Noted 20 pound weight loss since September and poor eating habits. Per chart review patient lives with son independent prior to admission. Works as a Print production planner. Son works from home and can assist. MRI of the brain showed acute early subacute infarction within the right hemi-pons  and multiple additional small foci in the right cerebellar hemisphere, left pontomedullary junction, right occipital lobe and right paramedian parietal lobe.WBC initially elevated 19,100 felt to be reactive improved to 6.9. 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 Lower extremity Dopplers negative for DVT. Carotid Doppler showed no ICA stenosis. Patient did not receive TPA. Echocardiogram with ejection fraction of 65% and grade 2 diastolic dysfunction. No regional wall motion abnormalities . CT chest abdomen pelvis completed for history of recent weight loss showing no evidence of malignancy. There was some incidental findings of pulmonary nodules recommend CT repeat in 6-12 months. 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 Advised need to repeat CTA of head and neck in 2-3 months to decide duration of anticoagulation use   Now requiring 24/7 Rehab RN,MD, as well as CIR level PT, OT and SLP.  Treatment team will focus on ADLs and mobility with goals set at Mod I See Team Conference Notes for weekly updates to the plan of care

## 2016-10-17 NOTE — Progress Notes (Signed)
Physical Therapy Session Note  Patient Details  Name: Mercedes Gardner MRN: [id number] Date of Birth: Apr 24, 1946  Today's Date: 10/17/2016 PT Individual Time: 1005-1101 PT Individual Time Calculation (min): 56 min   Short Term Goals: Week 1:  PT Short Term Goal 1 (Week 1): =LTGs due to ELOS  Skilled Therapeutic Interventions/Progress Updates:  Pt received in bed & agreeable to tx. Pt noted HA but RN reports pt is premedicated. Pt requesting to get dressed & donned new shirt & pants with supervision, requiring only min assist for standing balance while pt managed clothing. Pt reported need to use restroom & ambulated within room to bathroom with steady assist, steady assist for toilet transfers, & continent void on toilet. Pt ambulated room<>gym with RW & steady assist and cuing to ambulate within base of RW. Pt with worsening ataxia when negotiating small space (her room) versus open hallway/gym. Pt utilized nu-step with all 4 extremities on level 3 x 12 minutes with activity focusing on coordination of reciprocal movements and endurance training. Pt completed Berg Balance Test & scored [phone]; educated pt on interpretation of score & current fall risk. Patient demonstrates increased fall risk as noted by score of 34/56 on Berg Balance Scale (<36= high risk for falls, close to 100%; 37-45 significant >80%; 46-51 moderate >50%; 52-55 lower >25%). At end of session pt left sitting in recliner in room with all needs within reach.    Pt noted R ankle pain during ambulation, RN made aware.     Therapy Documentation Precautions:  Precautions Precautions: Fall Precaution Comments: ataxic gait, L hemiparesis Restrictions Weight Bearing Restrictions: No   Balance: Balance Balance Assessed: Yes Standardized Balance Assessment Standardized Balance Assessment: Berg Balance Test Berg Balance Test Sit to Stand: Able to stand without using hands and stabilize independently Standing Unsupported: Able  to stand 2 minutes with supervision Sitting with Back Unsupported but Feet Supported on Floor or Stool: Able to sit safely and securely 2 minutes Stand to Sit: Uses backs of legs against chair to control descent Transfers: Able to transfer with verbal cueing and /or supervision Standing Unsupported with Eyes Closed: Able to stand 10 seconds with supervision Standing Ubsupported with Feet Together: Able to place feet together independently and stand for 1 minute with supervision From Standing, Reach Forward with Outstretched Arm: Can reach confidently >25 cm (10") From Standing Position, Pick up Object from Floor: Able to pick up shoe, needs supervision From Standing Position, Turn to Look Behind Over each Shoulder: Looks behind from both sides and weight shifts well Turn 360 Degrees: Needs close supervision or verbal cueing (turns both directions but requires supervision) Standing Unsupported, Alternately Place Feet on Step/Stool: Needs assistance to keep from falling or unable to try Standing Unsupported, One Foot in Front: Loses balance while stepping or standing Standing on One Leg: Tries to lift leg/unable to hold 3 seconds but remains standing independently Total Score: 34   See Function Navigator for Current Functional Status.   Therapy/Group: Individual Therapy  Waunita Schooner 10/17/2016, 11:15 AM

## 2016-10-17 NOTE — Progress Notes (Signed)
ANTICOAGULATION CONSULT NOTE - Follow Up Consult  Pharmacy Consult for lovenox and coumadin Indication: basilar artery thrombosis  Allergies  Allergen Reactions  . Tramadol Other (See Comments)    Per pt, she got depressed, moody, and had increased back pain when she took tramadol   Vital Signs: Temp: 98.9 F (37.2 C) (02/04 0526) Temp Source: Oral (02/04 0526) BP: 120/74 (02/04 0526) Pulse Rate: 91 (02/04 0526)  Labs:  Recent Labs  10/15/16 0505 10/16/16 0253 10/17/16 0336  HGB 11.4*  --   --   HCT 36.1  --   --   PLT 224  --   --   LABPROT 13.9 17.4* 18.3*  INR 1.06 1.42 1.51  CREATININE 0.81  --   --     Estimated Creatinine Clearance (by C-G formula based on SCr of 0.81 mg/dL) Female: 58.8 mL/min Female: 69.1 mL/min   Medications:  Scheduled:  . amoxicillin-clavulanate  1 tablet Oral Q12H  . atorvastatin  80 mg Oral q1800  . busPIRone  7.5 mg Oral BID  . diclofenac sodium  2 g Topical QID  . enoxaparin (LOVENOX) injection  1 mg/kg Subcutaneous Q12H  . feeding supplement (PRO-STAT SUGAR FREE 64)  30 mL Oral BID  . ferrous sulfate  325 mg Oral BID WC  . fluticasone  2 spray Each Nare BID  . folic acid  1 mg Oral Daily  . multivitamin with minerals  1 tablet Oral Daily  . nicotine  21 mg Transdermal Daily  . pantoprazole  40 mg Oral Daily  . thiamine  100 mg Oral Daily  . topiramate  25 mg Oral Daily  . warfarin  5 mg Oral ONCE-1800  . 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 INR increasing, but remains subtherapeutic at 1.51. Hgb 11.4, plt normal. No s/s bleeding noted. No changes in renal function.   Goal of Therapy:  Anti-Xa level 0.6-1 units/ml 4hrs after LMWH dose given; INR 2-3 Monitor platelets by anticoagulation protocol: Yes   Plan:  Continue lovenox 1mg /kg subcutaneously  q12h Warfarin 5mg  tonight x1 Daily INR and CBC as needed Monitor s/sx of bleeding  Argie Ramming, PharmD Pharmacy Resident  Pager [phone] 10/17/16 7:57 AM
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
## 2016-10-18 ENCOUNTER — Inpatient Hospital Stay (HOSPITAL_COMMUNITY): Payer: Medicare Other | Admitting: Physical Therapy

## 2016-10-18 ENCOUNTER — Inpatient Hospital Stay (HOSPITAL_COMMUNITY): Payer: Medicare Other | Admitting: Speech Pathology

## 2016-10-18 ENCOUNTER — Inpatient Hospital Stay (HOSPITAL_COMMUNITY): Payer: Medicare Other | Admitting: Occupational Therapy

## 2016-10-18 LAB — PROTIME-INR
INR: 1.54
PROTHROMBIN TIME: 18.6 s — AB (ref 11.4–15.2)

## 2016-10-18 MED ORDER — COLCHICINE 0.6 MG PO TABS
0.6000 mg | ORAL_TABLET | Freq: Two times a day (BID) | ORAL | Status: DC
  Administered 2016-10-18 – 2016-10-22 (×9): 0.6 mg via ORAL
  Filled 2016-10-18 (×9): qty 1

## 2016-10-18 MED ORDER — WARFARIN SODIUM 3 MG PO TABS
6.0000 mg | ORAL_TABLET | Freq: Once | ORAL | Status: AC
  Administered 2016-10-18: 6 mg via ORAL
  Filled 2016-10-18: qty 2

## 2016-10-18 NOTE — Progress Notes (Signed)
Occupational Therapy Session Note  Patient Details  Name: Mercedes Gardner MRN: [id number] Date of Birth: 1945-12-25  Today's Date: 10/18/2016 OT Individual Time: [phone] and 1300-1330 OT Individual Time Calculation (min): 60 min and 30 min   Short Term Goals: Week 1:  OT Short Term Goal 1 (Week 1): STG = LTGs due to ELOS  Skilled Therapeutic Interventions/Progress Updates:    1) Treatment session with focus on ADL retraining with sit > stand, functional mobility, transfers, and functional use of LUE.  Pt received seated EOB reporting feeling less anxiety and depression.  Ambulated to room shower with RW and min guard assist, pt with 1 LOB when turning to approach seat in shower requiring assistance to regain balance. Close supervision when standing to wash buttocks, noted heavy use of grab bars for stability during sit > stand and while standing.  Increased time with use of LUE, but did not drop items during bathing and dressing.  Dressing completed at sit > stand level from EOB with pt able to complete all tasks, requiring increased time to fasten bra.  Ambulated 150 feet x2 with RW and min guard assist.  Pt veering to Lt with ambulation, but able to correct and recall cues for wider gait pattern and posture to improve balance.  2) Treatment session with focus on functional mobility, dynamic balance, and functional use of LUE.  Pt required increased time to encourage to leave the room as she kept calling family members on the phone to update them of her whereabouts (each of these family members know she is on rehab).  Ambulated with close supervision and RW to therapy gym.  Engaged in dynamic standing activity with focus on decreased UE support and scanning to Lt and Rt to locate items.  Pt completed 3D pipe tree puzzle in standing with min cues to utilize LUE throughout task.  Completed 9 hole peg test with cues to wait for therapist to tell her to start with Rt: 29.7 seconds and Lt: 55.7 seconds.   Returned to room, completed toilet transfers and toileting with close supervision.  Therapy Documentation Precautions:  Precautions Precautions: Fall Precaution Comments: ataxic gait, L hemiparesis Restrictions Weight Bearing Restrictions: No Pain:  Pt with no c/o pain  See Function Navigator for Current Functional Status.   Therapy/Group: Individual Therapy  [name] 10/18/2016, 9:44 AM

## 2016-10-18 NOTE — Progress Notes (Signed)
Speech Language Pathology Daily Session Note  Patient Details  Name: Mercedes Gardner MRN: [id number] Date of Birth: 1946/07/26  Today's Date: 10/18/2016 SLP Individual Time: 1015-1100 SLP Individual Time Calculation (min): 45 min  Short Term Goals: Week 1: SLP Short Term Goal 1 (Week 1): Pt will utilize speech intelligibility strategies at the sentence level with Min A verbal cues to achieve >90% intelligibility.  SLP Short Term Goal 2 (Week 1): Pt will demonstrate sustained attention to funcitonal task for 5 minutes with Min A verbal cues for redirection.  SLP Short Term Goal 3 (Week 1): Pt will self-monitor and correct verbal and functional errors with Mod A cues.  SLP Short Term Goal 4 (Week 1): Pt will demonstrate ability to manage medications with Min A verbal cues.  SLP Short Term Goal 5 (Week 1): Pt will demonstrate ability to perform money management with Min A verbal cues.  SLP Short Term Goal 6 (Week 1): Pt will demonstrate emergent awareness by identifying 2 physical and 2 cognitive deficits with Min A verbal cues.   Skilled Therapeutic Interventions: Skilled treatment session focused on cognitive goals. Patient completed basic money management task with Mod I and recalled events from previous therapy sessions with Min A verbal cues. Patient sustained attention to task for ~10 minutes with Min A verbal cues for redirection. Patient also self-monitored and corrected speech intelligibility errors at the sentence level with Min A verbal cues. Patient left sitting EOB with all needs within reach. Continue with current plan of care.      Function:    Cognition Comprehension Comprehension assist level: Understands complex 90% of the time/cues 10% of the time  Expression      Social Interaction Social Interaction assist level: Interacts appropriately 75 - 89% of the time - Needs redirection for appropriate language or to initiate interaction.  Problem Solving Problem solving assist  level: Solves basic problems with no assist  Memory Memory assist level: Complete Independence: No helper    Pain No/Denies Pain   Therapy/Group: Individual Therapy  Curran Lenderman 10/18/2016, 3:37 PM

## 2016-10-18 NOTE — Progress Notes (Signed)
Subjective/Complaints: Bilateral foot pain, pt states at home she took colchrys.  Also Indocin for flares, discussed rec against indocin due to recent CVA  ROS Neg CP, SOB, N/V/D Objective: Vital Signs: Blood pressure (!) 105/54, pulse 79, temperature 98.6 F (37 C), temperature source Oral, resp. rate 20, SpO2 100 %. No results found. Results for orders placed or performed during the hospital encounter of 10/14/16 (from the past 72 hour(s))  Protime-INR     Status: Abnormal   Collection Time: 10/16/16  2:53 AM  Result Value Ref Range   Prothrombin Time 17.4 (H) 11.4 - 15.2 seconds   INR 1.42   Protime-INR     Status: Abnormal   Collection Time: 10/17/16  3:36 AM  Result Value Ref Range   Prothrombin Time 18.3 (H) 11.4 - 15.2 seconds   INR 1.51   Protime-INR     Status: Abnormal   Collection Time: 10/18/16  2:54 AM  Result Value Ref Range   Prothrombin Time 18.6 (H) 11.4 - 15.2 seconds   INR 1.54      HEENT: normal Cardio: RRR and no murmur Resp: CTA B/L and unlabored GI: BS positive and NT, ND Extremity:  Pulses positive and No Edema Skin:   Intact Neuro: Alert/Oriented, Normal Sensory, Abnormal Motor 3/5 LUE, 4- LLE, 5/5 on RIght side, Abnormal FMC Ataxic/ dec FMC and Dysarthric Musc/Skel:  Normal Gen NAD   Assessment/Plan: 1. 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 Physiatrist is providing close team supervision and 24 hour management of active medical problems listed below. Physiatrist and rehab team continue to assess barriers to discharge/monitor patient progress toward functional and medical goals. FIM: Function - Bathing Position: Shower Body parts bathed by patient: Right arm, Left arm, Chest, Abdomen, Front perineal area, Buttocks, Right upper leg,  Left upper leg, Right lower leg, Left lower leg Body parts bathed by helper: Back Bathing not applicable: Back Assist Level: Supervision or verbal cues  Function- Upper Body Dressing/Undressing What is the patient wearing?: Pull over shirt/dress Bra - Perfomed by patient: Thread/unthread right bra strap, Hook/unhook bra (pull down sports bra), Thread/unthread left bra strap Pull over shirt/dress - Perfomed by patient: Thread/unthread right sleeve, Thread/unthread left sleeve, Put head through opening, Pull shirt over trunk Assist Level: Supervision or verbal cues, Set up Set up : To obtain clothing/put away Function - Lower Body Dressing/Undressing What is the patient wearing?: Pants, Non-skid slipper socks, Ted Hose Position: Sitting EOB Underwear - Performed by patient: Thread/unthread right underwear leg, Thread/unthread left underwear leg, Pull underwear up/down Pants- Performed by patient: Thread/unthread right pants leg, Thread/unthread left pants leg, Pull pants up/down Non-skid slipper socks- Performed by patient: Don/doff right sock, Don/doff left sock Shoes - Performed by patient: Don/doff right shoe, Don/doff left shoe TED Hose - Performed by helper: Don/doff right TED hose, Don/doff left TED hose Assist for footwear: Supervision/touching assist Assist for lower body dressing: Touching or steadying assistance (Pt > 75%)  Function - Toileting Toileting steps completed by patient: Adjust clothing prior to toileting, Performs perineal hygiene, Adjust clothing after toileting Toileting Assistive Devices: Grab bar or rail Assist level: Touching or steadying assistance (Pt.75%)  Function - Air cabin crew transfer assistive device: Walker Assist level to toilet: Touching or steadying assistance (Pt > 75%) Assist level from toilet: Touching or steadying assistance (Pt >  75%)  Function - Chair/bed transfer Chair/bed transfer method: Stand pivot, Ambulatory Chair/bed  transfer assist level: Touching or steadying assistance (Pt > 75%) Chair/bed transfer assistive device: Armrests, Walker  Function - Locomotion: Wheelchair Will patient use wheelchair at discharge?: No Wheelchair activity did not occur: N/A Function - Locomotion: Ambulation Assistive device: Walker-rolling Max distance: 150 ft Assist level: Touching or steadying assistance (Pt > 75%) Assist level: Touching or steadying assistance (Pt > 75%) Assist level: Touching or steadying assistance (Pt > 75%) Assist level: Touching or steadying assistance (Pt > 75%) Assist level: Moderate assist (Pt 50 - 74%)  Function - Comprehension Comprehension: Auditory Comprehension assist level: Understands complex 90% of the time/cues 10% of the time  Function - Expression Expression: Verbal Expression assist level: Expresses basic needs/ideas: With extra time/assistive device  Function - Social Interaction Social Interaction assist level: Interacts appropriately 75 - 89% of the time - Needs redirection for appropriate language or to initiate interaction.  Function - Problem Solving Problem solving assist level: Solves basic problems with no assist  Function - Memory Memory assist level: Complete Independence: No helper Patient normally able to recall (first 3 days only): Staff names and faces, That he or she is in a hospital  Medical Problem List and Plan:  1. 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 DVT Prophylaxis/Anticoagulation: Coumadin therapy with Lovenox for bridging. Monitor for any signs of bleeding. INR goal 2-3. INR stable at 1.5 , pharmacy dosing  Plan repeat CTA of head and neck 2-3 months to decide duration of anticoagulation use  3. Pain Management: Tylenol as needed , hx of R knee pain, gets injections with Dr Ninfa Linden, add  voltaren Headache, vascular =/- cervicogenic improved on topiramate 25mg  BID 4. Mood: Provide emotional support , CHronic anxiety increase Buspar to 7.5mg , neuropsych 5. Neuropsych: This patient is capable of making decisions on her own behalf.  6. Skin/Wound Care: Routine skin checks  7. Fluids/Electrolytes/Nutrition: Routine I&O with follow-up chemistries  8. Hypertension. Permissive hypertension. Patient on Norvasc 10 mg daily prior to admission.no need to resume presently     Vitals:   10/14/16 1822 10/15/16 0355  BP: 136/66 109/63  Pulse: 86 72  Resp: 18 17  Temp: 97.9 F (36.6 C) 98.6 F (37 C)   9. Tobacco abuse. Nicoderm patch. Provide counseling  10. Sinusitis. Complete course of Augmentin.  11. Hyperlipidemia. Lipitor  12. Decreased nutritional storage. TSH 0.451. Dietary supplements. Dietary consult  13. Leukocytosis. Resolved. Follow-up CBC  14. Hypokalemia. Follow-up chemistries  15. Normocytic anemia. Continue iron supplementation Hgb mildly reduced at 11.4  16. History of gout. Monitor for any signs of flareup  17. Constipation. Laxative assistance  18.  Mild hypoalbuminemia, prostat    19.  Bilateral foot pain with hx of gout, restart colchicine which pt used prophyllactically at this point no evidence of flare, does have hallux valgus and pain with MTP ROM, order darco sandals  LOS (Days) 4 A FACE TO FACE EVALUATION WAS PERFORMED  Onyekachi Gathright E 10/18/2016, 6:57 AM

## 2016-10-18 NOTE — Progress Notes (Signed)
Orthopedic Tech Progress Note Patient Details:  Mercedes Gardner [birth year] [id number]  Patient ID: Mercedes Gardner, female   DOB: 11-Apr-1946, 71 y.o.   MRN: [id number]   [name] 10/18/2016, 8:03 AMCalled [employer] for bilateral plastizote shoes or equivalent.

## 2016-10-18 NOTE — Progress Notes (Signed)
Physical Therapy Session Note  Patient Details  Name: Mercedes Gardner MRN: [id number] Date of Birth: [birth year]  Today's Date: 10/18/2016 PT Individual Time: 0905-1002 PT Individual Time Calculation (min): 57 min   Short Term Goals: Week 1:  PT Short Term Goal 1 (Week 1): =LTGs due to ELOS  Skilled Therapeutic Interventions/Progress Updates:    no c/o pain.  Session focus on activity tolerance, strengthening, attention, NMR, balance, and gait.    Pt transfers throughout session with RW and supervision.  Gait to and from therapy gym with RW and supervision with min cues during turns for walker positioning.  Gait training x240' without AD with pt staggering, scissoring, and with frequent LOB to L requiring overall steady assist.  PT discussed region of pt's stroke and effects on balance/gait.  Pt verbalized understanding but education to continue throughout LOS to ensure carryover.   Kinetron x2 minutes seated at 25 cm/s for LE reciprocal stepping pattern and activity tolerance.  Kinetron in standing at 20 cm/s focus on weight shifting and returning to midline with dynamic reaching task.  Kinetron x2 minutes in standing at 30 cm/s focus on progress to no UE support with min assist and min multimodal cues for upright posture and controlled movement to maintain balance.    LUE NMR focus on fine motor coordination, pincher and gross grasp to retrieve beads from theraputty.    Pt returned to room at end of session toileted with overall supervision and seated EOB to await SLP session.  Call bell in reach and needs met.   Therapy Documentation Precautions:  Precautions Precautions: Fall Precaution Comments: ataxic gait, L hemiparesis Restrictions Weight Bearing Restrictions: No   See Function Navigator for Current Functional Status.   Therapy/Group: Individual Therapy  Keagon Glascoe E Penven-Crew 10/18/2016, 9:35 AM

## 2016-10-18 NOTE — Progress Notes (Signed)
ANTICOAGULATION CONSULT NOTE - Follow Up Consult  Pharmacy Consult for lovenox and coumadin Indication: basilar artery thrombosis  Allergies  Allergen Reactions  . Tramadol Other (See Comments)    Per pt, she got depressed, moody, and had increased back pain when she took tramadol   Vital Signs: Temp: 98.6 F (37 C) (02/05 0500) Temp Source: Oral (02/05 0500) BP: 105/54 (02/05 0500) Pulse Rate: 79 (02/05 0500)  Labs:  Recent Labs  10/16/16 0253 10/17/16 0336 10/18/16 0254  LABPROT 17.4* 18.3* 18.6*  INR 1.42 1.51 1.54    Estimated Creatinine Clearance (by C-G formula based on SCr of 0.81 mg/dL) Female: 58.8 mL/min Female: 69.1 mL/min   Assessment: 71 yo female with basilar artery thrombosis is currently on subtherapeutic coumadin and is bridging with treatment dose of lovenox. INR 1.54 increasing, No s/s bleeding noted. No changes in renal function.   Goal of Therapy:  Anti-Xa level 0.6-1 units/ml 4hrs after LMWH dose given; INR 2-3 Monitor platelets by anticoagulation protocol: Yes   Plan:  Continue lovenox 55 mg (1mg /kg) subcutaneously q12h Warfarin 6 mg tonight x1 Daily INR and CBC as needed Monitor s/sx of bleeding  Maryanna Shape, PharmD, BCPS  Clinical Pharmacist  Pager: [phone]   10/18/16 1:09 PM

## 2016-10-19 ENCOUNTER — Inpatient Hospital Stay (HOSPITAL_COMMUNITY): Payer: Medicare Other | Admitting: Physical Therapy

## 2016-10-19 ENCOUNTER — Inpatient Hospital Stay (HOSPITAL_COMMUNITY): Payer: Medicare Other | Admitting: Occupational Therapy

## 2016-10-19 LAB — PROTIME-INR
INR: 1.78
Prothrombin Time: 20.9 seconds — ABNORMAL HIGH (ref 11.4–15.2)

## 2016-10-19 MED ORDER — WARFARIN SODIUM 5 MG PO TABS
5.0000 mg | ORAL_TABLET | Freq: Once | ORAL | Status: AC
  Administered 2016-10-19: 5 mg via ORAL
  Filled 2016-10-19: qty 1

## 2016-10-19 NOTE — Progress Notes (Signed)
Speech Language Pathology Daily Session Note  Patient Details  Name: Mercedes Gardner MRN: [id number] Date of Birth: 09/17/1945  Today's Date: 10/19/2016 SLP Individual Time: 0830-0900 SLP Individual Time Calculation (min): 30 min  Short Term Goals: Week 1: SLP Short Term Goal 1 (Week 1): Pt will utilize speech intelligibility strategies at the sentence level with Min A verbal cues to achieve >90% intelligibility.  SLP Short Term Goal 2 (Week 1): Pt will demonstrate sustained attention to funcitonal task for 5 minutes with Min A verbal cues for redirection.  SLP Short Term Goal 3 (Week 1): Pt will self-monitor and correct verbal and functional errors with Mod A cues.  SLP Short Term Goal 4 (Week 1): Pt will demonstrate ability to manage medications with Min A verbal cues.  SLP Short Term Goal 5 (Week 1): Pt will demonstrate ability to perform money management with Min A verbal cues.  SLP Short Term Goal 6 (Week 1): Pt will demonstrate emergent awareness by identifying 2 physical and 2 cognitive deficits with Min A verbal cues.   Skilled Therapeutic Interventions: Skilled treatment session focused on cognitive goals. SLP facilitated session by providing Min-Mod A verbal cues for recall of her current medications and their functions and was Mod I for organizing a BID pill box. Patient with intermittent verbosity and required supervision verbal cues for use of speech intelligibility strategies to achieve 100% intelligibility at the sentence level. Patient left upright EOB with alarm on. Continue with current plan of care.      Function:  Cognition Comprehension Comprehension assist level: Understands complex 90% of the time/cues 10% of the time  Expression   Expression assist level: Expresses basic needs/ideas: With extra time/assistive device  Social Interaction Social Interaction assist level: Interacts appropriately 75 - 89% of the time - Needs redirection for appropriate language or to  initiate interaction.  Problem Solving Problem solving assist level: Solves basic problems with no assist  Memory Memory assist level: Recognizes or recalls 75 - 89% of the time/requires cueing 10 - 24% of the time    Pain Pain Assessment Pain Assessment: 0-10 Pain Score: 6  Pain Type: Acute pain Pain Location: Head Pain Descriptors / Indicators: Headache Pain Frequency: Intermittent Pain Onset: Gradual Patients Stated Pain Goal: 4 Pain Intervention(s): Medication (See eMAR)  Therapy/Group: Individual Therapy  Judson Tsan 10/19/2016, 12:35 PM

## 2016-10-19 NOTE — Progress Notes (Signed)
Occupational Therapy Session Note  Patient Details  Name: SHEALEIGH KUTCH MRN: [id number] Date of Birth: 1945-11-28  Today's Date: 10/19/2016 OT Individual Time: [id number] [id number] and 1137-1204 OT Individual Time Calculation (min): 60 min and 27 min   Short Term Goals: Week 1:  OT Short Term Goal 1 (Week 1): STG = LTGs due to ELOS  Skilled Therapeutic Interventions/Progress Updates:    1) Treatment session with focus on sustained and selective attention, LUE NMR, and dynamic standing balance.  Pt declined bathing and dressing this session, requesting to work on her walking and balance. Pt donned socks and shoes seated EOB with ability to tie shoes.  Ambulated to therapy gym with RW and supervision, pt able to correct self as she would veer to Lt.  Engaged in ball toss in sitting with focus on bimanual use and symmetrical movements, with pt demonstrating decreased gross motor control in LUE.  Engaged in cup stacking in standing with focus on crossing midline and smooth, coordinated movements as pt tends to compensate for decreased movements by overpowering.  Further fine motor tasks with standing up small animal figurines followed by selecting animals and then cards in sequence to challenge memory and attention, as pt tends to be somewhat disjointed in conversation and easily distracted both internally and externally.  Returned to room as above and transferred to toilet with RW with supervision.  Completing toileting tasks with distant supervision. Pt reports her son will be doing all the cooking and her children and friends will be assisting with the household chores.  However plan to engage in functional mobility in home environment to increase safety awareness and challenge balance reactions.  2) Treatment session with focus on memory, attention, and functional mobility in community environment.  Pt received seated at EOB applying makeup with sister present.  Required increased cues to encourage focus and  attention to task.  Engaged in functional mobility with RW in community environment, gift shop, with min guard - supervision for safety and cues as pt would bump in to counter and bases of display cases.  Provided pt with 4 items to recall and locate in gift shop, with pt ambulating quickly through shop to locate all four before slowing down to browse the aisles.  Pt returned to room and completed toileting task with distant supervision with use of RW.  Therapy Documentation Precautions:  Precautions Precautions: Fall Precaution Comments: ataxic gait, L hemiparesis Restrictions Weight Bearing Restrictions: No General:   Vital Signs: Therapy Vitals Temp: 98.1 F (36.7 C) Temp Source: Oral Pulse Rate: 85 Resp: 19 BP: 126/67 Patient Position (if appropriate): Lying Oxygen Therapy SpO2: 98 % O2 Device: Not Delivered Pain: Pain Assessment Pain Assessment: 0-10 Pain Score: 4  Pain Type: Acute pain Pain Location: Back Pain Orientation: Mid;Lower Pain Descriptors / Indicators: Aching Pain Onset: Gradual Patients Stated Pain Goal: 0 Pain Intervention(s): Medication (See eMAR);Repositioned  See Function Navigator for Current Functional Status.   Therapy/Group: Individual Therapy  [name] 10/19/2016, 8:23 AM

## 2016-10-19 NOTE — Progress Notes (Signed)
ANTICOAGULATION CONSULT NOTE - Follow Up Consult  Pharmacy Consult for lovenox and coumadin Indication: basilar artery thrombosis   Allergies  Allergen Reactions  . 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 INR 1.78 increasing, No s/s bleeding noted. No changes in renal function.   Goal of Therapy:  Anti-Xa level 0.6-1 units/ml 4hrs after LMWH dose given; INR 2-3 Monitor platelets by anticoagulation protocol: Yes   Plan:  Continue lovenox 55 mg (1mg /kg) subcutaneously q12h Warfarin 5 mg tonight x1 Daily INR and CBC as needed Monitor s/sx of bleeding  Maryanna Shape, PharmD, BCPS  Clinical Pharmacist  Pager: [phone]   10/19/16 11:38 AM

## 2016-10-19 NOTE — Progress Notes (Signed)
Subjective/Complaints: Sister brought shoes, no foot pain this am Wants to go home ASAP  ROS Neg CP, SOB, N/V/D Objective: Vital Signs: Blood pressure 126/67, pulse 85, temperature 98.1 F (36.7 C), temperature source Oral, resp. rate 19, SpO2 98 %. No results found. Results for orders placed or performed during the hospital encounter of 10/14/16 (from the past 72 hour(s))  Protime-INR     Status: Abnormal   Collection Time: 10/17/16  3:36 AM  Result Value Ref Range   Prothrombin Time 18.3 (H) 11.4 - 15.2 seconds   INR 1.51   Protime-INR     Status: Abnormal   Collection Time: 10/18/16  2:54 AM  Result Value Ref Range   Prothrombin Time 18.6 (H) 11.4 - 15.2 seconds   INR 1.54   Protime-INR     Status: Abnormal   Collection Time: 10/19/16  5:13 AM  Result Value Ref Range   Prothrombin Time 20.9 (H) 11.4 - 15.2 seconds   INR 1.78      HEENT: normal Cardio: RRR and no murmur Resp: CTA B/L and unlabored GI: BS positive and NT, ND Extremity:  Pulses positive and No Edema Skin:   Intact Neuro: Alert/Oriented, Normal Sensory, Abnormal Motor 3/5 LUE, 4- LLE, 5/5 on RIght side, Abnormal FMC Ataxic/ dec FMC and Dysarthric Musc/Skel:  Normal Gen NAD   Assessment/Plan: 1. 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 Physiatrist is providing close team supervision and 24 hour management of active medical problems listed below. Physiatrist and rehab team continue to assess barriers to discharge/monitor patient progress toward functional and medical goals. FIM: Function - Bathing Position: Shower Body parts bathed by patient: Right arm, Left arm, Chest, Abdomen, Front perineal area, Buttocks, Right upper leg, Left upper leg, Right lower leg, Left lower leg Body parts bathed by  helper: Back Bathing not applicable: Back Assist Level: Supervision or verbal cues  Function- Upper Body Dressing/Undressing What is the patient wearing?: Bra, Pull over shirt/dress Bra - Perfomed by patient: Thread/unthread right bra strap, Hook/unhook bra (pull down sports bra), Thread/unthread left bra strap Pull over shirt/dress - Perfomed by patient: Thread/unthread right sleeve, Thread/unthread left sleeve, Put head through opening, Pull shirt over trunk Assist Level: Supervision or verbal cues, Set up Set up : To obtain clothing/put away Function - Lower Body Dressing/Undressing What is the patient wearing?: Underwear, Pants, Non-skid slipper socks, Ted Hose Position: Sitting EOB Underwear - Performed by patient: Thread/unthread right underwear leg, Thread/unthread left underwear leg, Pull underwear up/down Pants- Performed by patient: Thread/unthread right pants leg, Thread/unthread left pants leg, Pull pants up/down Non-skid slipper socks- Performed by patient: Don/doff right sock, Don/doff left sock Shoes - Performed by patient: Don/doff right shoe, Don/doff left shoe TED Hose - Performed by helper: Don/doff right TED hose, Don/doff left TED hose Assist for footwear: Supervision/touching assist Assist for lower body dressing: Supervision or verbal cues, Set up Set up : Don/doff TED stockings, To obtain clothing/put away  Function - Toileting Toileting steps completed by patient: Adjust clothing prior to toileting, Performs perineal hygiene, Adjust clothing after toileting Toileting Assistive Devices: Grab bar or rail Assist level: Supervision or verbal cues  Function - Air cabin crew transfer assistive device: Walker Assist level to toilet: Supervision or verbal cues Assist level from toilet: Supervision or verbal cues  Function - Chair/bed transfer Chair/bed  transfer method: Ambulatory Chair/bed transfer assist level: Supervision or verbal cues Chair/bed  transfer assistive device: Armrests, Walker  Function - Locomotion: Wheelchair Will patient use wheelchair at discharge?: No Wheelchair activity did not occur: N/A Function - Locomotion: Ambulation Assistive device: No device Max distance: 240 Assist level: Touching or steadying assistance (Pt > 75%) Assist level: Touching or steadying assistance (Pt > 75%) Assist level: Touching or steadying assistance (Pt > 75%) Assist level: Touching or steadying assistance (Pt > 75%) Assist level: Moderate assist (Pt 50 - 74%)  Function - Comprehension Comprehension: Auditory Comprehension assist level: Understands complex 90% of the time/cues 10% of the time  Function - Expression Expression: Verbal Expression assist level: Expresses basic needs/ideas: With extra time/assistive device  Function - Social Interaction Social Interaction assist level: Interacts appropriately 75 - 89% of the time - Needs redirection for appropriate language or to initiate interaction.  Function - Problem Solving Problem solving assist level: Solves basic problems with no assist  Function - Memory Memory assist level: Complete Independence: No helper Patient normally able to recall (first 3 days only): Staff names and faces, That he or she is in a hospital  Medical Problem List and Plan:  1. 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  Team conf in am 2. DVT Prophylaxis/Anticoagulation: Coumadin therapy with Lovenox for bridging. Monitor for any signs of bleeding. INR goal 2-3. INR stable at 1.5 , pharmacy dosing  Plan repeat CTA of head and neck 2-3 months to decide duration of anticoagulation use  3. Pain Management: Tylenol as needed , hx of R knee pain, gets injections with Dr Ninfa Linden, add voltaren Headache, vascular =/- cervicogenic improved on topiramate 25mg  BID 4.  Mood: Provide emotional support , CHronic anxiety increase Buspar to 7.5mg , neuropsych 5. Neuropsych: This patient is capable of making decisions on her own behalf.  6. Skin/Wound Care: Routine skin checks  7. Fluids/Electrolytes/Nutrition: Routine I&O with follow-up chemistries  8. Hypertension. Permissive hypertension. Patient on Norvasc 10 mg daily prior to admission.no need to resume presently     Vitals:   10/14/16 1822 10/15/16 0355  BP: 136/66 109/63  Pulse: 86 72  Resp: 18 17  Temp: 97.9 F (36.6 C) 98.6 F (37 C)   9. Tobacco abuse. Nicoderm patch. Provide counseling  10. Sinusitis. Complete course of Augmentin.  11. Hyperlipidemia. Lipitor  12. Decreased nutritional storage. TSH 0.451. Dietary supplements. Dietary consult  13. Leukocytosis. Resolved. Follow-up CBC  14. Hypokalemia. Follow-up chemistries  15. Normocytic anemia. Continue iron supplementation Hgb mildly reduced at 11.4  16. History of gout. Monitor for any signs of flareup  17. Constipation. Laxative assistance  18.  Mild hypoalbuminemia, prostat    19.  Bilateral foot pain with hx of gout, restart colchicine which pt used prophyllactically at this point no evidence of flare, does have hallux valgus and pain with MTP ROM, order plastizote shoes which can be worn during gout flares  LOS (Days) 5 A FACE TO FACE EVALUATION WAS PERFORMED  KIRSTEINS,ANDREW E 10/19/2016, 7:01 AM

## 2016-10-19 NOTE — Progress Notes (Signed)
Physical Therapy Session Note  Patient Details  Name: Mercedes Gardner MRN: [id number] Date of Birth: December 28, 1945  Today's Date: 10/19/2016 PT Individual Time: 0900-1000 PT Individual Time Calculation (min): 60 min   Short Term Goals: Week 1:  PT Short Term Goal 1 (Week 1): =LTGs due to ELOS  Skilled Therapeutic Interventions/Progress Updates:    no c/o pain.  Session focus on NMR, dynamic and static standing balance, transfers, and gait with LRAD.  Pt ambulates to and from therapy gym with close supervision and RW, requiring cues when turning to maintain walker positioning.    NMR in standing for alternating toe taps to 4" box 3 trials to fatigue focus on weight shift and motor control, standing on 3" wedge focus on hip strategy and postural control during card matching task, and 2x15 reps minisquats and heel raises for strengthening.   Gait training with RW weaving through cones focus on maintaining balance walker positioning during turns.  Pt completed 3 trials through cones and on final trial stooped to pick cones up from floor.  Supervision overall for task with min verbal cues for walker positioning.    Pt returned to room at end of session and positioned with call bell in reach and needs met.   Therapy Documentation Precautions:  Precautions Precautions: Fall Precaution Comments: ataxic gait, L hemiparesis Restrictions Weight Bearing Restrictions: No   See Function Navigator for Current Functional Status.   Therapy/Group: Individual Therapy  Earnest Conroy Penven-Crew 10/19/2016, 9:47 AM

## 2016-10-19 NOTE — Progress Notes (Signed)
Occupational Therapy Session Note  Patient Details  Name: RABAB MERRITTS MRN: [id number] Date of Birth: September 09, 1946  Today's Date: 10/19/2016 OT Individual Time: 1345-1430 OT Individual Time Calculation (min): 45 min    Short Term Goals: Week 1:  OT Short Term Goal 1 (Week 1): STG = LTGs due to ELOS  Skilled Therapeutic Interventions/Progress Updates:    Upon entering the room, pt seated on EOB awaiting therapist. Pt verbalizing need to utilize bathroom and performed sit <>stand with RW and supervision. Pt ambulated to bathroom for toileting transfers, hygiene, and clothing management with use of RW and supervision overall. Pt ambulated to ADL apartment for low furniture transfer with supervision as well. OT provided pt with RW bag and educated pt on use for kitchen mobility in order to not carry items in hands for safety. Pt verbalized understanding. Pt ambulating 150' back to room with overall supervision. Pt returned to bed at end of session with call bell and all needed items within reach.   Therapy Documentation Precautions:  Precautions Precautions: Fall Precaution Comments: ataxic gait, L hemiparesis Restrictions Weight Bearing Restrictions: No General:   Vital Signs: Therapy Vitals Temp: 98 F (36.7 C) Temp Source: Oral Pulse Rate: 80 Resp: 19 BP: 115/60 Patient Position (if appropriate): Sitting Oxygen Therapy SpO2: 99 % O2 Device: Not Delivered  See Function Navigator for Current Functional Status.   Therapy/Group: Individual Therapy  Gypsy Decant 10/19/2016, 4:36 PM

## 2016-10-20 ENCOUNTER — Inpatient Hospital Stay (HOSPITAL_COMMUNITY): Payer: Medicare Other | Admitting: Occupational Therapy

## 2016-10-20 ENCOUNTER — Inpatient Hospital Stay (HOSPITAL_COMMUNITY): Payer: Medicare Other | Admitting: Physical Therapy

## 2016-10-20 ENCOUNTER — Inpatient Hospital Stay (HOSPITAL_COMMUNITY): Payer: Medicare Other | Admitting: Speech Pathology

## 2016-10-20 LAB — PROTIME-INR
INR: 2.17
Prothrombin Time: 24.6 seconds — ABNORMAL HIGH (ref 11.4–15.2)

## 2016-10-20 MED ORDER — WARFARIN SODIUM 5 MG PO TABS
5.0000 mg | ORAL_TABLET | Freq: Every day | ORAL | Status: DC
  Administered 2016-10-20: 5 mg via ORAL
  Filled 2016-10-20: qty 1

## 2016-10-20 NOTE — Progress Notes (Signed)
Physical Therapy Session Note  Patient Details  Name: Mercedes Gardner MRN: [id number] Date of Birth: 1945/09/15  Today's Date: 10/20/2016 PT Individual Time: 1140-1206 PT Individual Time Calculation (min): 26 min   Short Term Goals: Week 1:  PT Short Term Goal 1 (Week 1): =LTGs due to ELOS  Skilled Therapeutic Interventions/Progress Updates:    no c/o pain.  Session focus on NMR and balance.    Pt transfers sit<>stand mod I and ambulates to and from therapy gym with supervision.  Pt can be mod I with ambulation when attending to task, but when distracted requires up to mod cues to attend to task to improve balance.  Biodex training x4 trials for weight shifting and balance, pt requiring up to min assist to weight shift effectively and up to mod multimodal cues to stop talking and attend to task.  Pt demonstrated no carryover with education regarding weight shifting versus lateral leaning of trunk. Pt completed limits of stability training with 51 seconds/30%, 41 seconds/32%, 34 seconds/55%, and 53 seconds/19%.   Pt returned to room at end of session with call bell in reach and needs met.   Therapy Documentation Precautions:  Precautions Precautions: Fall Precaution Comments: ataxic gait, L hemiparesis Restrictions Weight Bearing Restrictions: No   See Function Navigator for Current Functional Status.   Therapy/Group: Individual Therapy  Earnest Conroy Penven-Crew 10/20/2016, 12:07 PM

## 2016-10-20 NOTE — Patient Care Conference (Signed)
Inpatient RehabilitationTeam Conference and Plan of Care Update Date: 10/20/2016   Time: 11:15 AM    Patient Name: Mercedes Gardner      Medical Record Number: [id number]  Date of Birth: Dec 04, 1945 Sex: Female         Room/Bed: 4M04C/4M04C-01 Payor Info: Payor: MEDICARE / Plan: MEDICARE PART A AND B / Product Type: *No Product type* /    Admitting Diagnosis: CVA  Admit Date/Time:  10/14/2016  5:48 PM Admission Comments: No comment available   Primary Diagnosis:  <principal problem not specified> Principal Problem: <principal problem not specified>  Patient Active Problem List   Diagnosis Date Noted  . Hemiparesis affecting left side as late effect of stroke ([place]) 10/14/2016  . Gait disturbance, post-stroke 10/14/2016  . Dysarthria   . Left-sided weakness   . Lung nodule   . Unintentional weight loss   . Cerebral infarction due to thrombosis of basilar artery ([place])   . Stroke-like symptoms 10/11/2016  . FTT (failure to thrive) in adult 10/11/2016  . Leukocytosis 10/11/2016  . Acute hypokalemia 10/11/2016  . Normocytic anemia 10/11/2016  . Cerebral thrombosis with cerebral infarction 10/11/2016  . Acute CVA (cerebrovascular accident) ([place]) 10/11/2016  . 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 Ischemic stroke ([place])   . Anxiety   . Hypokalemia   . Pelvic mass in female 12/26/2014  . Gout 12/24/2008  . ALLERGIC RHINITIS 12/20/2008  . KNEE PAIN, LEFT 12/20/2008  . TOBACCO ABUSE 07/29/2006  . HTN (hypertension) 07/29/2006  . HEMORRHOIDS 07/29/2006    Expected Discharge Date: Expected Discharge Date: 10/22/16  Team Members Present: Physician leading conference: Dr. Alysia Penna Social Worker Present: Ovidio Kin, LCSW Nurse Present: Dorthula Nettles, RN PT Present: Dwyane Dee, PT OT Present: [name], OT SLP Present: Weston Anna, SLP PPS Coordinator present : Daiva Nakayama, RN, CRRN     Current Status/Progress Goal Weekly Team Focus   Medical   Impulsive, poor balance without walker  reduce fall risk  D.C. planning, safety education   Bowel/Bladder   Continent of B/B. LBM 10/19/16  pt will maintain current level of continence  monitor for changes in continence level; treat any constipation   Swallow/Nutrition/ Hydration             ADL's   Supervision overall  Mod I overall  ADL retraining, alternating attention, safety awareness, awareness of deficits   Mobility   min assist>supervision  supervision for mobility, transfers mod I  balance, safety awareness, gait with RW, stair training, d/c planning, grad day Thursday   Communication   Supervision-Min A  Min A  family education    Safety/Cognition/ Behavioral Observations  Supervision-Min A  Min A  family education    Pain   R ankle pain; headaches; PRN tylenol 650mg ; scheduled voltaren gel  <3  monitor for effectiveness of PRN medications   Skin   no current skin issues  maintain status  monitor for changes in status      *See Care Plan and progress notes for long and short-term goals.  Barriers to Discharge: impulsivity    Possible Resolutions to Barriers:  Will D/C 2/16 after further therapy    Discharge Planning/Teaching Needs:  Home with son who works out of the home and can provide supervision if needed. Pt is doing well and wants to go by end of the week.      Team Discussion:  Goals mod/i-supervision. Impulsive and moves too quickly which increases her  risk of falling. Becomes easily distracted and needs to not talk to focus on the task at hand. Foot pain on and off form gout-MD ordered shoes for this. Safety awareness main issue.   Revisions to Treatment Plan:  DC 2/9   Continued Need for Acute Rehabilitation Level of Care: The patient requires daily medical management by a physician with specialized training in physical medicine and rehabilitation for the following conditions: Daily direction of a multidisciplinary physical rehabilitation  program to ensure safe treatment while eliciting the highest outcome that is of practical value to the patient.: Yes Daily medical management of patient stability for increased activity during participation in an intensive rehabilitation regime.: Yes Daily analysis of laboratory values and/or radiology reports with any subsequent need for medication adjustment of medical intervention for : Neurological problems  Corena Tilson, Gardiner Rhyme 10/20/2016, 12:59 PM

## 2016-10-20 NOTE — Progress Notes (Signed)
Occupational Therapy Session Note  Patient Details  Name: DILARA WYLLIE MRN: [id number] Date of Birth: [birth year]  Today's Date: 10/20/2016 OT Individual Time: 0730-0830 OT Individual Time Calculation (min): 60 min    Short Term Goals: Week 1:  OT Short Term Goal 1 (Week 1): STG = LTGs due to ELOS  Skilled Therapeutic Interventions/Progress Updates:    Treatment session completed with focus on ADL retraining and sequencing during bathing and dressing.  Pt continues to require min cues for organization and sequencing of self-care tasks.  Pt continues to perseverate on her phone.  Completed bathing at overall Mod I level at sit >stand in room shower.  Dressing completed at supervision- Mod I level with therapist providing min cues for organization of tasks and focused attention to task.    Pt with increased anxiety throughout session, requesting to d/c home ASAP.  Pt expressing desire to speak with MD before beginning therapy as she is wanting to go home and reports she will continue therapies as an outpatient.  Required cues and encouragement to engage in therapy session, pt internally distracted and phoning family members during session despite reminders of focus of therapy session.  MD arrived towards end of session and reiterated what therapist had been telling pt regarding follow up therapies.  Therapy Documentation Precautions:  Precautions Precautions: Fall Precaution Comments: ataxic gait, L hemiparesis Restrictions Weight Bearing Restrictions: No General:   Vital Signs: Therapy Vitals Temp: 98.6 F (37 C) Temp Source: Oral Pulse Rate: 87 Resp: 17 BP: (!) 95/57 Patient Position (if appropriate): Lying Oxygen Therapy SpO2: 98 % O2 Device: Not Delivered Pain:  Pt with no c/o pain  See Function Navigator for Current Functional Status.   Therapy/Group: Individual Therapy  [name] 10/20/2016, 7:40 AM

## 2016-10-20 NOTE — Progress Notes (Signed)
Physical Therapy Session Note  Patient Details  Name: Mercedes Gardner MRN: [id number] Date of Birth: [birth year]  Today's Date: 10/20/2016 PT Individual Time: 1530-1630 PT Individual Time Calculation (min): 60 min   Short Term Goals: Week 1:  PT Short Term Goal 1 (Week 1): =LTGs due to ELOS  Skilled Therapeutic Interventions/Progress Updates:    no c/o pain.  Session focus on balance, weight shifting, transfers, gait, NMR, and activity tolerance.    Pt transfers sit<>stand throughout session with supervision and RW requiring occasional cues for hand placement during transfer.  Gait throughout unit mod I with RW.  Pt toileted at start of session with distant supervision.  Dynamic standing balance, standing tolerance, and NMR for weight shifting/forced use with Wii Fit on tilt table game and river/bubble game.  Pt requires min/mod tactile cues and max verbal cues to complete both weight shifting games. No carry over from AM session or throughout this session regarding cues for weight shift through LEs and not by leaning trunk.  Nustep x10 minutes with all 4 extremities for reciprocal stepping pattern, forced use, and cardiovascular endurance.  Pt returned to room at end of session and positioned with call bell in reach and needs met.   Therapy Documentation Precautions:  Precautions Precautions: Fall Precaution Comments: ataxic gait, L hemiparesis Restrictions Weight Bearing Restrictions: No   See Function Navigator for Current Functional Status.   Therapy/Group: Individual Therapy  Earnest Conroy Penven-Crew 10/20/2016, 4:35 PM

## 2016-10-20 NOTE — Progress Notes (Signed)
Social Work Patient ID: Mercedes Gardner, female   DOB: 1946-04-07, 71 y.o.   MRN: [id number]  Met with pt, sister-Mercedes Gardner and daughter-Mercedes Gardner via telephone to discuss team conference goals-supervision level And target discharge date 2/9. Daughter concerned about discharge, and if pt is ready. She does not feel she is partly because pt will do what she wants at home and not listen to them. Pt is set in her ways And does what she wants to do and will continue to do this. Daughter wants her to do home health due to transportation issue. Will discuss with her the concerns due to pt wanted OP an hour ago and expressed no Concerns about transport. Agreeable to equipment and daughter wants PCS set up. Son will not be there 24 hr so at times pt will be alone. Made all aware of her high risk to fall when moving too quickly and her need to Focus. Will work on discharge for Friday.

## 2016-10-20 NOTE — Progress Notes (Addendum)
ANTICOAGULATION CONSULT NOTE - Follow Up Consult  Pharmacy Consult for lovenox and coumadin Indication: basilar artery thrombosis   Allergies  Allergen Reactions  . 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 INR 2.17 today, increasing, No s/s bleeding noted. No changes in renal function.   Goal of Therapy:  Anti-Xa level 0.6-1 units/ml 4hrs after LMWH dose given; INR 2-3 Monitor platelets by anticoagulation protocol: Yes   Plan:  D/c lovenox Warfarin 5 mg daily Daily INR and CBC as needed Monitor s/sx of bleeding  Maryanna Shape, PharmD, BCPS  Clinical Pharmacist  Pager: [phone]   10/20/16 9:39 AM

## 2016-10-20 NOTE — Progress Notes (Signed)
Social Work Elease Hashimoto, LCSW Social Worker Signed   Patient Care Conference Date of Service: 10/20/2016 12:59 PM      Hide copied text Hover for attribution information Inpatient RehabilitationTeam Conference and Plan of Care Update Date: 10/20/2016   Time: 11:15 AM      Patient Name: Mercedes Gardner      Medical Record Number: [id number]  Date of Birth: Oct 14, 1945 Sex: Female         Room/Bed: 4M04C/4M04C-01 Payor Info: Payor: MEDICARE / Plan: MEDICARE PART A AND B / Product Type: *No Product type* /     Admitting Diagnosis: CVA  Admit Date/Time:  10/14/2016  5:48 PM Admission Comments: No comment available    Primary Diagnosis:  <principal problem not specified> Principal Problem: <principal problem not specified>       Patient Active Problem List    Diagnosis Date Noted  . Hemiparesis affecting left side as late effect of stroke ([place]) 10/14/2016  . Gait disturbance, post-stroke 10/14/2016  . Dysarthria    . Left-sided weakness    . Lung nodule    . Unintentional weight loss    . Cerebral infarction due to thrombosis of basilar artery ([place])    . Stroke-like symptoms 10/11/2016  . FTT (failure to thrive) in adult 10/11/2016  . Leukocytosis 10/11/2016  . Acute hypokalemia 10/11/2016  . Normocytic anemia 10/11/2016  . Cerebral thrombosis with cerebral infarction 10/11/2016  . Acute CVA (cerebrovascular accident) ([place]) 10/11/2016  . 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 Ischemic stroke ([place])    . Anxiety    . Hypokalemia    . Pelvic mass in female 12/26/2014  . Gout 12/24/2008  . ALLERGIC RHINITIS 12/20/2008  . KNEE PAIN, LEFT 12/20/2008  . TOBACCO ABUSE 07/29/2006  . HTN (hypertension) 07/29/2006  . HEMORRHOIDS 07/29/2006      Expected Discharge Date: Expected Discharge Date: 10/22/16   Team Members Present: Physician leading conference: Dr. Alysia Penna Social Worker Present: Ovidio Kin, LCSW Nurse Present: Dorthula Nettles, RN PT  Present: Dwyane Dee, PT OT Present: [name], OT SLP Present: Weston Anna, SLP PPS Coordinator present : Daiva Nakayama, RN, CRRN       Current Status/Progress Goal Weekly Team Focus  Medical     Impulsive, poor balance without walker  reduce fall risk  D.C. planning, safety education   Bowel/Bladder     Continent of B/B. LBM 10/19/16  pt will maintain current level of continence  monitor for changes in continence level; treat any constipation   Swallow/Nutrition/ Hydration               ADL's     Supervision overall  Mod I overall  ADL retraining, alternating attention, safety awareness, awareness of deficits   Mobility     min assist>supervision  supervision for mobility, transfers mod I  balance, safety awareness, gait with RW, stair training, d/c planning, grad day Thursday   Communication     Supervision-Min A  Min A  family education    Safety/Cognition/ Behavioral Observations   Supervision-Min A  Min A  family education    Pain     R ankle pain; headaches; PRN tylenol 650mg ; scheduled voltaren gel  <3  monitor for effectiveness of PRN medications   Skin     no current skin issues  maintain status  monitor for changes in status     *See Care Plan and progress notes for long and short-term goals.  Barriers to Discharge: impulsivity     Possible Resolutions to Barriers:  Will D/C 2/16 after further therapy     Discharge Planning/Teaching Needs:  Home with son who works out of the home and can provide supervision if needed. Pt is doing well and wants to go by end of the week.      Team Discussion:  Goals mod/i-supervision. Impulsive and moves too quickly which increases her risk of falling. Becomes easily distracted and needs to not talk to focus on the task at hand. Foot pain on and off form gout-MD ordered shoes for this. Safety awareness main issue.   Revisions to Treatment Plan:  DC 2/9    Continued Need for Acute Rehabilitation Level of Care: The  patient requires daily medical management by a physician with specialized training in physical medicine and rehabilitation for the following conditions: Daily direction of a multidisciplinary physical rehabilitation program to ensure safe treatment while eliciting the highest outcome that is of practical value to the patient.: Yes Daily medical management of patient stability for increased activity during participation in an intensive rehabilitation regime.: Yes Daily analysis of laboratory values and/or radiology reports with any subsequent need for medication adjustment of medical intervention for : Neurological problems   Elease Hashimoto 10/20/2016, 12:59 PM       Patient ID: Mercedes Gardner, female   DOB: Oct 17, 1945, 71 y.o.   MRN: [id number]

## 2016-10-20 NOTE — Progress Notes (Signed)
Subjective/Complaints: Pt asking about D/C date, states she has son at home that can help as well as other family members  ROS Neg CP, SOB, N/V/D Objective: Vital Signs: Blood pressure (!) 95/57, pulse 87, temperature 98.6 F (37 C), temperature source Oral, resp. rate 17, SpO2 98 %. No results found. Results for orders placed or performed during the hospital encounter of 10/14/16 (from the past 72 hour(s))  Protime-INR     Status: Abnormal   Collection Time: 10/18/16  2:54 AM  Result Value Ref Range   Prothrombin Time 18.6 (H) 11.4 - 15.2 seconds   INR 1.54   Protime-INR     Status: Abnormal   Collection Time: 10/19/16  5:13 AM  Result Value Ref Range   Prothrombin Time 20.9 (H) 11.4 - 15.2 seconds   INR 1.78   Protime-INR     Status: Abnormal   Collection Time: 10/20/16  5:24 AM  Result Value Ref Range   Prothrombin Time 24.6 (H) 11.4 - 15.2 seconds   INR 2.17      HEENT: normal Cardio: RRR and no murmur Resp: CTA B/L and unlabored GI: BS positive and NT, ND Extremity:  Pulses positive and No Edema Skin:   Intact Neuro: Alert/Oriented, Normal Sensory, Abnormal Motor 3/5 LUE, 4- LLE, 5/5 on RIght side, Abnormal FMC Ataxic/ dec FMC and Dysarthric Musc/Skel:  Normal Gen NAD   Assessment/Plan: 1. 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 Physiatrist is providing close team supervision and 24 hour management of active medical problems listed below. Physiatrist and rehab team continue to assess barriers to discharge/monitor patient progress toward functional and medical goals. FIM: Function - Bathing Position: Shower Body parts bathed by patient: Right arm, Left arm, Chest, Abdomen, Front perineal area, Buttocks, Right upper leg, Left upper leg, Right lower leg, Left  lower leg Body parts bathed by helper: Back Bathing not applicable: Back Assist Level: Supervision or verbal cues  Function- Upper Body Dressing/Undressing What is the patient wearing?: Bra, Pull over shirt/dress Bra - Perfomed by patient: Thread/unthread right bra strap, Hook/unhook bra (pull down sports bra), Thread/unthread left bra strap Pull over shirt/dress - Perfomed by patient: Thread/unthread right sleeve, Thread/unthread left sleeve, Put head through opening, Pull shirt over trunk Assist Level: Supervision or verbal cues, Set up Set up : To obtain clothing/put away Function - Lower Body Dressing/Undressing What is the patient wearing?: Socks, Shoes, Ted Hose Position: Sitting EOB Underwear - Performed by patient: Thread/unthread right underwear leg, Thread/unthread left underwear leg, Pull underwear up/down Pants- Performed by patient: Thread/unthread right pants leg, Thread/unthread left pants leg, Pull pants up/down Non-skid slipper socks- Performed by patient: Don/doff right sock, Don/doff left sock Socks - Performed by patient: Don/doff right sock, Don/doff left sock Shoes - Performed by patient: Don/doff right shoe, Don/doff left shoe, Fasten right, Fasten left TED Hose - Performed by helper: Don/doff right TED hose, Don/doff left TED hose Assist for footwear: Supervision/touching assist Assist for lower body dressing: Set up Set up : To obtain clothing/put away, Don/doff TED stockings  Function - Toileting Toileting steps completed by patient: Adjust clothing prior to toileting, Performs perineal hygiene, Adjust clothing after toileting Toileting Assistive Devices: Grab bar or rail Assist level: Supervision or verbal cues  Function - Air cabin crew transfer assistive device: Walker Assist level to toilet: Supervision or  verbal cues Assist level from toilet: Supervision or verbal cues  Function - Chair/bed transfer Chair/bed transfer method:  Ambulatory Chair/bed transfer assist level: Supervision or verbal cues Chair/bed transfer assistive device: Armrests, Walker  Function - Locomotion: Wheelchair Will patient use wheelchair at discharge?: No Wheelchair activity did not occur: N/A Function - Locomotion: Ambulation Assistive device: Walker-rolling Max distance: 150 Assist level: Supervision or verbal cues Assist level: Supervision or verbal cues Assist level: Supervision or verbal cues Assist level: Supervision or verbal cues Assist level: Moderate assist (Pt 50 - 74%)  Function - Comprehension Comprehension: Auditory Comprehension assist level: Understands complex 90% of the time/cues 10% of the time  Function - Expression Expression: Verbal Expression assist level: Expresses basic needs/ideas: With extra time/assistive device  Function - Social Interaction Social Interaction assist level: Interacts appropriately 75 - 89% of the time - Needs redirection for appropriate language or to initiate interaction.  Function - Problem Solving Problem solving assist level: Solves basic problems with no assist  Function - Memory Memory assist level: Recognizes or recalls 75 - 89% of the time/requires cueing 10 - 24% of the time Patient normally able to recall (first 3 days only): Staff names and faces, That he or she is in a hospital  Medical Problem List and Plan:  1. 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  Team conference today please see physician documentation under team conference tab, met with team face-to-face to discuss problems,progress, and goals. Formulized individual treatment plan based on medical history, underlying problem and comorbidities. 2. DVT Prophylaxis/Anticoagulation: Coumadin therapy with Lovenox for bridging. Monitor for any signs of bleeding. INR goal 2-3.  INR at goal Plan repeat CTA of head and neck 2-3 months to decide duration of anticoagulation use  3. Pain Management: Tylenol as needed , hx of R knee pain, gets injections with Dr Ninfa Linden, add voltaren Headache, vascular =/- cervicogenic improved on topiramate '25mg'$  BID, will d/c home on this 4. Mood: Provide emotional support , CHronic anxiety increase Buspar to 7.'5mg'$ , neuropsych 5. Neuropsych: This patient is capable of making decisions on her own behalf.  6. Skin/Wound Care: Routine skin checks  7. Fluids/Electrolytes/Nutrition: Routine I&O with follow-up chemistries  8. Hypertension. Permissive hypertension. Patient on Norvasc 10 mg daily prior to admission.BP running low but asymptomatic, intake good                            9. Tobacco abuse. Nicoderm patch. Provide counseling  10. Sinusitis. Complete course of Augmentin.  11. Hyperlipidemia. Lipitor  12. Decreased nutritional storage. . Dietary supplements.   14. Hypokalemia Resolved 15. Normocytic anemia. Continue iron supplementation Hgb mildly reduced at 11.4  16. History of gout. Monitor for any signs of flareup  17. Constipation. Laxative assistance  18.  Mild hypoalbuminemia, prostat    19.  Bilateral foot pain with hx of gout, restart colchicine which pt used prophyllactically at this point no evidence of flare, does have hallux valgus and pain with MTP ROM, order plastizote shoes which can be worn during gout flares  LOS (Days) 6 A FACE TO FACE EVALUATION WAS PERFORMED  KIRSTEINS,ANDREW E 10/20/2016, 7:52 AM

## 2016-10-20 NOTE — Progress Notes (Signed)
Speech Language Pathology Daily Session Note  Patient Details  Name: Mercedes Gardner MRN: [id number] Date of Birth: 06-05-1946  Today's Date: 10/20/2016 SLP Individual Time: 0930-1025 SLP Individual Time Calculation (min): 55 min  Short Term Goals: Week 1: SLP Short Term Goal 1 (Week 1): Pt will utilize speech intelligibility strategies at the sentence level with Min A verbal cues to achieve >90% intelligibility.  SLP Short Term Goal 2 (Week 1): Pt will demonstrate sustained attention to funcitonal task for 5 minutes with Min A verbal cues for redirection.  SLP Short Term Goal 3 (Week 1): Pt will self-monitor and correct verbal and functional errors with Mod A cues.  SLP Short Term Goal 4 (Week 1): Pt will demonstrate ability to manage medications with Min A verbal cues.  SLP Short Term Goal 5 (Week 1): Pt will demonstrate ability to perform money management with Min A verbal cues.  SLP Short Term Goal 6 (Week 1): Pt will demonstrate emergent awareness by identifying 2 physical and 2 cognitive deficits with Min A verbal cues.   Skilled Therapeutic Interventions: Skilled treatment session focused on cognitive goals. SLP facilitated session by re-administering the MoCA (version 7.3). Patient scored 25/30 points with a score of 26 or above considered normal. Patient continues to demonstrate deficits in the areas of short-term memory. SLP also facilitated session by providing education in regards to memory compensatory strategies and how to incorporate them at home. Patient verbalized understanding and provided examples with supervision verbal cues. Patient left upright sitting EOB with alarm on and all needs within reach. Continue with current plan of care.      Function:  Eating Eating                 Cognition Comprehension Comprehension assist level: Understands complex 90% of the time/cues 10% of the time  Expression   Expression assist level: Expresses basic needs/ideas: With extra  time/assistive device  Social Interaction Social Interaction assist level: Interacts appropriately 75 - 89% of the time - Needs redirection for appropriate language or to initiate interaction.  Problem Solving Problem solving assist level: Solves basic 90% of the time/requires cueing < 10% of the time  Memory Memory assist level: Recognizes or recalls 90% of the time/requires cueing < 10% of the time    Pain No/Denies Pain   Therapy/Group: Individual Therapy  Alaric Gladwin 10/20/2016, 3:06 PM

## 2016-10-21 ENCOUNTER — Inpatient Hospital Stay (HOSPITAL_COMMUNITY): Payer: Medicare Other | Admitting: Speech Pathology

## 2016-10-21 ENCOUNTER — Inpatient Hospital Stay (HOSPITAL_COMMUNITY): Payer: Medicare Other | Admitting: Occupational Therapy

## 2016-10-21 ENCOUNTER — Inpatient Hospital Stay (HOSPITAL_COMMUNITY): Payer: Medicare Other | Admitting: Physical Therapy

## 2016-10-21 LAB — PROTIME-INR
INR: 2
PROTHROMBIN TIME: 23 s — AB (ref 11.4–15.2)

## 2016-10-21 MED ORDER — WARFARIN SODIUM 7.5 MG PO TABS
7.5000 mg | ORAL_TABLET | Freq: Once | ORAL | Status: AC
  Administered 2016-10-21: 7.5 mg via ORAL
  Filled 2016-10-21: qty 1

## 2016-10-21 MED ORDER — WARFARIN SODIUM 5 MG PO TABS: 5.0000 mg | ORAL_TABLET | Freq: Every day | ORAL | Status: DC

## 2016-10-21 NOTE — Progress Notes (Signed)
Social Work Patient ID: Mercedes Gardner, female   DOB: 08-Feb-1946, 72 y.o.   MRN: [id number] Phone  Calls from daughter and son expressing concerns regarding pt, being ready to come home. Encouraged them to come in and attend therapies with pt but neither has come. Discussed team recommends 24 hr supervision and follow up therapies. Pt insists on going to OP therapies. Although family wants home health, pt voiced: " I over rule them."  Pt reports she doesn't know what they are so worried about. Work on discharge for tomorrow.

## 2016-10-21 NOTE — Progress Notes (Signed)
Speech Language Pathology Daily Session Note  Patient Details  Name: Mercedes Gardner MRN: [id number] Date of Birth: 09-10-1946  Today's Date: 10/21/2016 SLP Individual Time: 1000-1030 SLP Individual Time Calculation (min): 30 min  Short Term Goals: Week 1: SLP Short Term Goal 1 (Week 1): Pt will utilize speech intelligibility strategies at the sentence level with Min A verbal cues to achieve >90% intelligibility.  SLP Short Term Goal 2 (Week 1): Pt will demonstrate sustained attention to funcitonal task for 5 minutes with Min A verbal cues for redirection.  SLP Short Term Goal 3 (Week 1): Pt will self-monitor and correct verbal and functional errors with Mod A cues.  SLP Short Term Goal 4 (Week 1): Pt will demonstrate ability to manage medications with Min A verbal cues.  SLP Short Term Goal 5 (Week 1): Pt will demonstrate ability to perform money management with Min A verbal cues.  SLP Short Term Goal 6 (Week 1): Pt will demonstrate emergent awareness by identifying 2 physical and 2 cognitive deficits with Min A verbal cues.   Skilled Therapeutic Interventions: Skilled treatment session focused on cognitive goals. SLP facilitated session by providing supervision verbal cues for anticipatory awareness in regards to d/c planning and generating a list of activities that patient can perform independently and a list of activities she will need assistance with at discharge. Patient left sitting EOB with all needs within reach. Continue with current plan of care.      Function:  Cognition Comprehension Comprehension assist level: Understands complex 90% of the time/cues 10% of the time  Expression   Expression assist level: Expresses basic needs/ideas: With extra time/assistive device  Social Interaction Social Interaction assist level: Interacts appropriately 75 - 89% of the time - Needs redirection for appropriate language or to initiate interaction.  Problem Solving Problem solving assist  level: Solves basic 90% of the time/requires cueing < 10% of the time  Memory Memory assist level: Recognizes or recalls 90% of the time/requires cueing < 10% of the time    Pain Pain Assessment Pain Assessment: 0-10 Pain Score: 4  Pain Type: Acute pain Pain Location: Head Pain Intervention(s): Medication (See eMAR)  Therapy/Group: Individual Therapy  Lumina Gitto 10/21/2016, 12:25 PM

## 2016-10-21 NOTE — Discharge Summary (Signed)
NAMESHAMONA, [name] NO.:  [id number]  MEDICAL RECORD NO.:  [id number]  LOCATION:                                 FACILITY:  PHYSICIAN:  Mercedes Gardner, M.D.DATE OF BIRTH:  [birth year]  DATE OF ADMISSION:  10/14/2016 DATE OF DISCHARGE:  10/22/2016                              DISCHARGE SUMMARY   DISCHARGE DIAGNOSES: 1. 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 2. Coumadin therapy for deep vein thrombosis prophylaxis. 3. Pain management. 4. Depression. 5. Hypertension. 6. Tobacco abuse. 7. Sinusitis. 8. Hyperlipidemia. 9. Decreased nutritional storage. 10.Hypokalemia resolved. 11.Normocytic anemia. 12.Gout. 13.Constipation resolved.  HISTORY OF PRESENT ILLNESS:  This is a 71 year old right-handed female with history of hypertension, hyperlipidemia, and tobacco abuse who presented on October 10, 2016, with waxing and waning symptoms of intermittent slurred speech, left-sided weakness, noted 20 pounds weight loss since September and poor eating habits.  Per chart review, she lives with her son independent prior to admission.  Works as a Print production planner.  MRI of the brain showed acute early subacute infarct within the right hemipons and multiple additional small foci on the right cerebellar hemisphere, right occipital and right paramedian parietal lobe.  WBCs elevated at 19,100, and felt to be reactive improved to 6.9. 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  Lower extremity Dopplers negative.  Carotid Doppler showed no ICA stenosis.  The patient did not receive tPA.  Echocardiogram with ejection fraction of 65% and grade 2 diastolic dysfunction.  No regional wall abnormalities.  CT of chest, abdomen, and pelvis completed for history of recent weight loss showing no evidence of malignancy.  There were  some incidental findings of pulmonary nodules.  Recommend CT repeat in 6 to 12 months.  Placed on Coumadin for CVA prophylaxis.  Lovenox for bridging.  Advised need to repeat CTA of head and neck in 2-3 months to decide duration of anticoagulation use.  Maintained on Augmentin for sinusitis.  Bouts of hypokalemia at 2.6 corrected with potassium supplement.  Physical and occupational therapy ongoing.  The patient was admitted for a comprehensive rehab program.  PAST MEDICAL HISTORY:  See discharge diagnoses.  SOCIAL HISTORY:  Lives with son, independent prior to admission.  FUNCTIONAL STATUS:  Upon admission to rehab services was moderate assist, 50 feet, one-person handheld assistance; minimal guard sit to supine; min to mod assist with activities of daily living.  PHYSICAL EXAMINATION:  GENERAL:  Alert female in no acute distress. LUNGS:  Clear to auscultation without wheeze. CARDIAC:  Regular rate and rhythm.  No murmur. ABDOMEN:  Soft, nontender.  Good bowel sounds.  Fair awareness of deficits.  [place] COURSE:  The patient was admitted to inpatient rehab services with therapies initiated on a 3-hour daily basis, consisting of physical therapy, occupational therapy, and rehabilitation nursing.  The following issues were addressed during the patient's rehabilitation stay.  Pertaining to Mrs. 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  She was maintained on Coumadin therapy and  would follow up with her primary MD, Dr. Jeanie Cooks for monitoring of Coumadin therapy. Recommendations were made by Neurology Services to repeat CTA of head and neck in 2 to 3 months to decide duration of anticoagulation use. The patient had no bleeding episodes.  Blood pressure is well controlled, although remaining soft.  She did have a history of tobacco abuse.  She was weaned from her Nicoderm patch.   She was completing a course of Augmentin for sinusitis.  Lipitor ongoing for hyperlipidemia. Bouts of hypokalemia resolved with potassium supplement.  She did have a history of gout.  She was maintained on low-dose colchicine.  The patient received weekly collaborative interdisciplinary team conferences to discuss estimated length of stay, family teaching, any barriers to her discharge.  She was ambulating throughout the unit modified independent with a rolling walker.  She could toilet herself with distance supervision.  Gather belongings for activities of daily living and homemaking.  Utilized bathroom, performed sit to stand, rolling walker on supervision.  Performed toilet transfers, hygiene and clothing management with supervision.  Overall, the patient with excellent overall progress, was discharged to home after family teaching completed.  DISCHARGE MEDICATIONS: 1. Lipitor 80 mg p.o. daily. 2. BuSpar 7.5 mg p.o. b.i.d. 3. Colchicine 0.6 mg p.o. b.i.d. 4. Voltaren gel 4 times daily to affected area. 5. Ferrous sulfate 325 mg p.o. b.i.d. 6. Flonase 2 sprays each nostril twice daily. 7. Folic acid 1 mg p.o. daily. 8. Multivitamin daily. 9.Nicoderm patch taper as directed. 10.Protonix 40 mg p.o. daily. 11.Topamax 25 mg p.o. daily. 12.Coumadin latest dose of 5 mg adjusted for INR of 2.00 to 3.00.  DIET:  Regular.  The patient would follow up with Dr. Alysia Gardner, at the outpatient rehab center as directed.  Dr. Erlinda Gardner at [facility] neurology [facility], call for appointment; Dr. Nolene Gardner.  SPECIAL INSTRUCTIONS:  No driving or smoking.  10/22/2016 PT 25.1 INR 2.23  Patient to follow-up with PCP Dr. Nolene Gardner [phone], fax number [phone] check INR on Tuesday, October 26, 2016.  Follow up with neurology Services for repeat CTA of head and neck in 2-3 months to decide duration of anticoagulation use.  The patient should have CT of chest and abdomen completed in the next  6 to 12 months for incidental findings of pulmonary nodules.     Mercedes Gardner, P.A.   ______________________________ Mercedes Gardner, M.D.    DA/MEDQ  D:  10/21/2016  T:  10/21/2016  Job:  [id number]  cc:   Mercedes Gardner, M.D. Mercedes Hawking, MD

## 2016-10-21 NOTE — Progress Notes (Signed)
Physical Therapy Discharge Summary  Patient Details  Name: Mercedes Gardner MRN: [id number] Date of Birth: [birth year]  Today's Date: 10/21/2016 PT Individual Time: 1300-1400 PT Individual Time Calculation (min): 60 min    Patient has met 9 of 9 long term goals due to improved activity tolerance, improved balance, improved postural control, increased strength, ability to compensate for deficits, improved attention, improved awareness and improved coordination.  Patient to discharge at an ambulatory level Modified Independent.    Recommendation:  Patient will benefit from ongoing skilled PT services in outpatient setting to continue to advance safe functional mobility, address ongoing impairments in balance, coordination, attention, and safety awareness, and minimize fall risk.  Equipment: RW  Reasons for discharge: treatment goals met  Patient/family agrees with progress made and goals achieved: Yes   Skilled Therapeutic Intervention: Pt initially c/o headache pain but then when asked later in session to rate pt states she's not had a headache today.  Session focus on reassessment of balance, strength, coordination, and overall functional mobility.    Pt currently performing all transfers and basic ambulation mod I.  Requires min cues for car transfer, ambulation up/down ramp, and ambulation on uneven surfaces.  PT re administered BERG and patient demonstrates moderate fall risk as noted by score of 47/56 on Berg Balance Scale.  Interpreted results for pt and recommended continued use of RW for safety at home until balance and awareness progresses further.  Nustep x10 minutes with BLEs only at level 5 for strengthening, reciprocal stepping pattern retraining, and cardiovascular endurance.  Pt returned to room at end of session, toileted mod I, and returned to bed.  Call bell in reach and needs met.    PT Discharge Precautions/Restrictions Precautions Precautions: Fall Precaution Comments:  decreased safety awareness Restrictions Weight Bearing Restrictions: No Pain Pain Assessment Pain Assessment: No/denies pain Vision/Perception  Perception Comments: appears WNL  Cognition Overall Cognitive Status: Impaired/Different from baseline Arousal/Alertness: Awake/alert Orientation Level: Oriented X4 Attention: Sustained;Selective Sustained Attention: Impaired Selective Attention: Impaired Memory: Appears intact Memory Impairment: Decreased recall of new information;Decreased short term memory Awareness: Impaired Problem Solving: Impaired Executive Function:  (all impaired) Sensation Sensation Light Touch: Appears Intact (does have some N/T with gout flareups) Coordination Gross Motor Movements are Fluid and Coordinated: Yes Fine Motor Movements are Fluid and Coordinated: Yes Motor  Motor Motor: Within Functional Limits  Mobility Bed Mobility Bed Mobility: Supine to Sit;Sit to Supine Supine to Sit: 6: Modified independent (Device/Increase time) Sit to Supine: 6: Modified independent (Device/Increase time) Transfers Transfers: Yes Sit to Stand: 6: Modified independent (Device/Increase time) Stand to Sit: 6: Modified independent (Device/Increase time) Locomotion  Ambulation Ambulation: Yes Ambulation/Gait Assistance: 6: Modified independent (Device/Increase time) Ambulation Distance (Feet): 150 Feet Assistive device: Rolling walker Gait Gait: Yes Gait Pattern: Impaired Gait Pattern: Ataxic Gait velocity: decreased Stairs / Additional Locomotion Stairs: Yes Stairs Assistance: 5: Supervision Stair Management Technique: One rail Left;Two rails Number of Stairs: 12 Ramp: 5: Supervision Curb: 5: Supervision Wheelchair Mobility Wheelchair Mobility: No  Trunk/Postural Assessment  Cervical Assessment Cervical Assessment: Within Functional Limits Thoracic Assessment Thoracic Assessment: Within Functional Limits Lumbar Assessment Lumbar Assessment: Within  Functional Limits Postural Control Postural Control: Within Functional Limits  Balance Balance Balance Assessed: Yes Standardized Balance Assessment Standardized Balance Assessment: Berg Balance Test Berg Balance Test Sit to Stand: Able to stand without using hands and stabilize independently Standing Unsupported: Able to stand safely 2 minutes Sitting with Back Unsupported but Feet Supported on Floor or Stool: Able to sit  safely and securely 2 minutes Stand to Sit: Controls descent by using hands Transfers: Able to transfer safely, minor use of hands Standing Unsupported with Eyes Closed: Able to stand 10 seconds safely Standing Ubsupported with Feet Together: Able to place feet together independently and stand 1 minute safely From Standing, Reach Forward with Outstretched Arm: Can reach confidently >25 cm (10") From Standing Position, Pick up Object from Floor: Able to pick up shoe safely and easily From Standing Position, Turn to Look Behind Over each Shoulder: Looks behind from both sides and weight shifts well Turn 360 Degrees: Able to turn 360 degrees safely but slowly Standing Unsupported, Alternately Place Feet on Step/Stool: Able to stand independently and complete 8 steps >20 seconds Standing Unsupported, One Foot in Front: Able to take small step independently and hold 30 seconds Standing on One Leg: Tries to lift leg/unable to hold 3 seconds but remains standing independently Total Score: 47 Static Standing Balance Static Standing - Level of Assistance: 6: Modified independent (Device/Increase time) Dynamic Standing Balance Dynamic Standing - Level of Assistance: 6: Modified independent (Device/Increase time) Extremity Assessment      RLE Assessment RLE Assessment: Within Functional Limits LLE Assessment LLE Assessment: Within Functional Limits   See Function Navigator for Current Functional Status.  Rainey Kahrs E Penven-Crew 10/21/2016, 1:54 PM

## 2016-10-21 NOTE — Discharge Summary (Signed)
Discharge summary job [id number]

## 2016-10-21 NOTE — Discharge Instructions (Signed)
Inpatient Rehab Discharge Instructions  Mercedes Gardner Discharge date and time: No discharge date for patient encounter.   Activities/Precautions/ Functional Status: Activity: activity as tolerated Diet: regular diet Wound Care: none needed Functional status:  ___ No restrictions     ___ Walk up steps independently ___ 24/7 supervision/assistance   ___ Walk up steps with assistance ___ Intermittent supervision/assistance  ___ Bathe/dress independently ___ Walk with walker     _x__ Bathe/dress with assistance ___ Walk Independently    ___ Shower independently ___ Walk with assistance    ___ Shower with assistance ___ No alcohol     ___ Return to work/school ________  Special Instructions: No driving or smoking  Follow-up outpatient with PCP to Dr. Nolene Ebbs 10/26/2016 [phone] fax number [phone] to check prothrombin time will maintained on Coumadin therapy   COMMUNITY REFERRALS UPON DISCHARGE:    Outpatient: PT, OT, SP  Agency:[facility] [phone]   Date of Last Service:10/22/2016  Appointment Date/Time:FEBRUARY 13 Tuesday 12:45-2:45 PM-PT & SP  2/20-10:15-11:00 AM-OT  Medical Equipment/Items Ordered:ROLLIGN WALKER & TUB BENCH  Agency/Supplier:[facility]   [phone]   GENERAL COMMUNITY RESOURCES FOR PATIENT/FAMILY: Support Groups:[facility] SUPPORT GROUP EVERY SECOND Thursday @ 3:00-4:00 PM ON THE REHAB UNIT QUESTIONS CONTACT CAIT[place]N  [id number]   STROKE/TIA DISCHARGE INSTRUCTIONS SMOKING Cigarette smoking nearly doubles your risk of having a stroke & is the single most alterable risk factor  If you smoke or have smoked in the last 12 months, you are advised to quit smoking for your health.  Most of the excess cardiovascular risk related to smoking disappears within a year of stopping.  Ask you doctor about anti-smoking medications  [place] Quit Line: 1-800-QUIT NOW  Free Smoking Cessation Classes [phone]  CHOLESTEROL Know your  levels; limit fat & cholesterol in your diet  Lipid Panel     Component Value Date/Time   CHOL 190 10/12/2016 0547   TRIG 97 10/12/2016 0547   HDL 42 10/12/2016 0547   CHOLHDL 4.5 10/12/2016 0547   VLDL 19 10/12/2016 0547   LDLCALC 129 (H) 10/12/2016 0547      Many patients benefit from treatment even if their cholesterol is at goal.  Goal: Total Cholesterol (CHOL) less than 160  Goal:  Triglycerides (TRIG) less than 150  Goal:  HDL greater than 40  Goal:  LDL (LDLCALC) less than 100   BLOOD PRESSURE American Stroke Association blood pressure target is less that 120/80 mm/Hg  Your discharge blood pressure is:  BP: (!) 95/57  Monitor your blood pressure  Limit your salt and alcohol intake  Many individuals will require more than one medication for high blood pressure  DIABETES (A1c is a blood sugar average for last 3 months) Goal HGBA1c is under 7% (HBGA1c is blood sugar average for last 3 months)  Diabetes: No known diagnosis of diabetes    Lab Results  Component Value Date   HGBA1C 5.4 10/12/2016     Your HGBA1c can be lowered with medications, healthy diet, and exercise.  Check your blood sugar as directed by your physician  Call your physician if you experience unexplained or low blood sugars.  PHYSICAL ACTIVITY/REHABILITATION Goal is 30 minutes at least 4 days per week  Activity: Increase activity slowly, Therapies: Physical Therapy: Home Health Return to work:   Activity decreases your risk of heart attack and stroke and makes your heart stronger.  It helps control your weight and blood pressure; helps you relax and can improve  your mood.  Participate in a regular exercise program.  Talk with your doctor about the best form of exercise for you (dancing, walking, swimming, cycling).  DIET/WEIGHT Goal is to maintain a healthy weight  Your discharge diet is: Diet Heart Room service appropriate? Yes; Fluid consistency: Thin  liquids Your height is:    Your  current weight is: Weight: 59.6 kg (131 lb 6.3 oz) Your Body Mass Index (BMI) is:     Following the type of diet specifically designed for you will help prevent another stroke.  Your goal weight range is:    Your goal Body Mass Index (BMI) is 19-24.  Healthy food habits can help reduce 3 risk factors for stroke:  High cholesterol, hypertension, and excess weight.  RESOURCES Stroke/Support Group:  Call [phone]   STROKE EDUCATION PROVIDED/REVIEWED AND GIVEN TO PATIENT Stroke warning signs and symptoms How to activate emergency medical system (call 911). Medications prescribed at discharge. Need for follow-up after discharge. Personal risk factors for stroke. Pneumonia vaccine given:  Flu vaccine given:  My questions have been answered, the writing is legible, and I understand these instructions.  I will adhere to these goals & educational materials that have been provided to me after my discharge from the hospital.      My questions have been answered and I understand these instructions. I will adhere to these goals and the provided educational materials after my discharge from the hospital.  Patient/Caregiver Signature _______________________________ Date __________  Clinician Signature _______________________________________ Date __________  Please bring this form and your medication list with you to all your follow-up doctor's appointments.

## 2016-10-21 NOTE — Progress Notes (Signed)
Occupational Therapy Discharge Summary  Patient Details  Name: Mercedes Gardner MRN: [id number] Date of Birth: [birth year]  Patient has met 95 of 74 long term goals due to improved activity tolerance, improved balance, postural control, ability to compensate for deficits, functional use of  LEFT upper and LEFT lower extremity, improved attention, improved awareness and improved coordination.  Patient to discharge at overall Modified Independent level with BADLs, supervision with tub/shower transfers and iADLs.  Patient's care partner is independent to provide the necessary physical and cognitive assistance at discharge.  Pt lives with son who works from home, he plans to do all the cooking and he and other family members will assist with housekeeping tasks.  Reasons goals not met: N/A  Recommendation:  Patient will benefit from ongoing skilled OT services in outpatient setting to continue to advance functional skills in the area of BADL, iADL and Reduce care partner burden.  Equipment: RW and tub bench  Reasons for discharge: treatment goals met and discharge from hospital  Patient/family agrees with progress made and goals achieved: Yes  OT Discharge Precautions/Restrictions  Precautions Precautions: Fall Precaution Comments: ataxic gait, L hemiparesis Restrictions Weight Bearing Restrictions: No General   Vital Signs Therapy Vitals Temp: 98.6 F (37 C) Temp Source: Oral Pulse Rate: 85 Resp: 16 BP: 102/68 Patient Position (if appropriate): Sitting Oxygen Therapy SpO2: 99 % O2 Device: Not Delivered Pain Pain Assessment Pain Assessment: No/denies pain ADL  See Function Navigator Vision/Perception  Vision- History Baseline Vision/History: Wears glasses Wears Glasses: Reading only Patient Visual Report: No change from baseline Vision- Assessment Vision Assessment?: Yes Eye Alignment: Within Functional Limits Ocular Range of Motion: Within Functional Limits Visual  Fields: No apparent deficits Additional Comments: duing visual assessment, pt tends to lose fixation on target.  Question if due to decreased attention as pt internally as well as externally distracted Perception Comments: appears WFL  Cognition Overall Cognitive Status: Impaired/Different from baseline Arousal/Alertness: Awake/alert Orientation Level: Oriented X4 Attention: Sustained;Selective Sustained Attention: Impaired Selective Attention: Impaired Memory: Impaired Memory Impairment: Decreased recall of new information;Decreased short term memory Awareness: Impaired Problem Solving: Impaired Sequencing: Impaired Organizing: Impaired Behaviors: Restless Safety/Judgment: Appears intact Sensation Sensation Light Touch: Appears Intact Hot/Cold: Appears Intact Proprioception: Appears Intact Coordination Gross Motor Movements are Fluid and Coordinated: Yes Fine Motor Movements are Fluid and Coordinated: Yes Finger Nose Finger Test: mild dysmetria on Lt 9 Hole Peg Test: Rt: 29 and Lt: 55 on eval, Lt: 50 seconds on 2/8 Motor  Motor Motor: Within Functional Limits Mobility  Bed Mobility Bed Mobility: Supine to Sit;Sit to Supine Supine to Sit: 6: Modified independent (Device/Increase time) Sit to Supine: 6: Modified independent (Device/Increase time) Transfers Sit to Stand: 6: Modified independent (Device/Increase time) Stand to Sit: 6: Modified independent (Device/Increase time)  Trunk/Postural Assessment  Cervical Assessment Cervical Assessment: Within Functional Limits Thoracic Assessment Thoracic Assessment: Within Functional Limits Lumbar Assessment Lumbar Assessment: Within Functional Limits Postural Control Postural Control: Within Functional Limits  Balance Balance Balance Assessed: Yes Standardized Balance Assessment Standardized Balance Assessment: Berg Balance Test Berg Balance Test Sit to Stand: Able to stand without using hands and stabilize  independently Standing Unsupported: Able to stand safely 2 minutes Sitting with Back Unsupported but Feet Supported on Floor or Stool: Able to sit safely and securely 2 minutes Stand to Sit: Controls descent by using hands Transfers: Able to transfer safely, minor use of hands Standing Unsupported with Eyes Closed: Able to stand 10 seconds safely Standing Ubsupported with Feet Together: Able to  place feet together independently and stand 1 minute safely From Standing, Reach Forward with Outstretched Arm: Can reach confidently >25 cm (10") From Standing Position, Pick up Object from Floor: Able to pick up shoe safely and easily From Standing Position, Turn to Look Behind Over each Shoulder: Looks behind from both sides and weight shifts well Turn 360 Degrees: Able to turn 360 degrees safely but slowly Standing Unsupported, Alternately Place Feet on Step/Stool: Able to stand independently and complete 8 steps >20 seconds Standing Unsupported, One Foot in Front: Able to take small step independently and hold 30 seconds Standing on One Leg: Tries to lift leg/unable to hold 3 seconds but remains standing independently Total Score: 47 Static Standing Balance Static Standing - Level of Assistance: 6: Modified independent (Device/Increase time) Dynamic Standing Balance Dynamic Standing - Level of Assistance: 6: Modified independent (Device/Increase time) Extremity/Trunk Assessment RUE Assessment RUE Assessment: Within Functional Limits LUE Assessment LUE Assessment: Exceptions to WFL LUE AROM (degrees) Overall AROM Left Upper Extremity: Within functional limits for tasks assessed LUE Strength LUE Overall Strength: Deficits LUE Overall Strength Comments: grossly 3+/5   See Function Navigator for Current Functional Status.  Mercedes Gardner 10/21/2016, 4:06 PM

## 2016-10-21 NOTE — Progress Notes (Signed)
Subjective/Complaints: Nervous about going home, son will be cooking for her.  No new pain c/os occ HA  ROS Neg CP, SOB, N/V/D Objective: Vital Signs: Blood pressure 102/68, pulse 85, temperature 98.6 F (37 C), temperature source Oral, resp. rate 16, weight 59.6 kg (131 lb 6.3 oz), SpO2 99 %. No results found. Results for orders placed or performed during the hospital encounter of 10/14/16 (from the past 72 hour(s))  Protime-INR     Status: Abnormal   Collection Time: 10/19/16  5:13 AM  Result Value Ref Range   Prothrombin Time 20.9 (H) 11.4 - 15.2 seconds   INR 1.78   Protime-INR     Status: Abnormal   Collection Time: 10/20/16  5:24 AM  Result Value Ref Range   Prothrombin Time 24.6 (H) 11.4 - 15.2 seconds   INR 2.17   Protime-INR     Status: Abnormal   Collection Time: 10/21/16  5:08 AM  Result Value Ref Range   Prothrombin Time 23.0 (H) 11.4 - 15.2 seconds   INR 2.00      HEENT: normal Cardio: RRR and no murmur Resp: CTA B/L and unlabored GI: BS positive and NT, ND Extremity:  Pulses positive and No Edema Skin:   Intact Neuro: Alert/Oriented, Normal Sensory, Abnormal Motor 3/5 LUE, 4- LLE, 5/5 on RIght side, Abnormal FMC Ataxic/ dec FMC and Dysarthric Musc/Skel:  Normal Gen NAD   Assessment/Plan: 1. 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 Physiatrist is providing close team supervision and 24 hour management of active medical problems listed below. Physiatrist and rehab team continue to assess barriers to discharge/monitor patient progress toward functional and medical goals. FIM: Function - Bathing Position: Shower Body parts bathed by patient: Right arm, Left arm, Chest, Abdomen, Front perineal area, Buttocks, Right upper leg, Left upper leg, Right lower  leg, Left lower leg, Back Body parts bathed by helper: Back Bathing not applicable: Back Assist Level: Set up Set up : To obtain items  Function- Upper Body Dressing/Undressing What is the patient wearing?: Bra, Pull over shirt/dress Bra - Perfomed by patient: Thread/unthread right bra strap, Hook/unhook bra (pull down sports bra), Thread/unthread left bra strap Pull over shirt/dress - Perfomed by patient: Thread/unthread right sleeve, Thread/unthread left sleeve, Put head through opening, Pull shirt over trunk Assist Level: Supervision or verbal cues Set up : To obtain clothing/put away Function - Lower Body Dressing/Undressing What is the patient wearing?: Underwear, Pants, Non-skid slipper socks, Ted Hose Position: Sitting EOB Underwear - Performed by patient: Thread/unthread right underwear leg, Thread/unthread left underwear leg, Pull underwear up/down Pants- Performed by patient: Thread/unthread right pants leg, Thread/unthread left pants leg, Pull pants up/down Non-skid slipper socks- Performed by patient: Don/doff right sock, Don/doff left sock Socks - Performed by patient: Don/doff right sock, Don/doff left sock Shoes - Performed by patient: Don/doff right shoe, Don/doff left shoe, Fasten right, Fasten left TED Hose - Performed by patient: Don/doff right TED hose, Don/doff left TED hose TED Hose - Performed by helper: Don/doff right TED hose, Don/doff left TED hose Assist for footwear: Supervision/touching assist Assist for lower body dressing: Supervision or verbal cues Set up : To obtain clothing/put away, Don/doff TED stockings  Function - Toileting Toileting steps completed by patient: Adjust clothing prior to toileting, Performs perineal hygiene, Adjust clothing after toileting Toileting Assistive Devices: Grab  bar or rail Assist level: Supervision or verbal cues  Function - Air cabin crew transfer assistive device: Walker, Grab bar Assist level to toilet:  Supervision or verbal cues Assist level from toilet: Supervision or verbal cues  Function - Chair/bed transfer Chair/bed transfer method: Ambulatory Chair/bed transfer assist level: Supervision or verbal cues Chair/bed transfer assistive device: Armrests, Walker  Function - Locomotion: Wheelchair Will patient use wheelchair at discharge?: No Wheelchair activity did not occur: N/A Function - Locomotion: Ambulation Assistive device: Walker-rolling Max distance: 150 Assist level: No help, No cues, assistive device, takes more than a reasonable amount of time Assist level: No help, No cues, assistive device, takes more than a reasonable amount of time Assist level: No help, No cues, assistive device, takes more than a reasonable amount of time Assist level: No help, No cues, assistive device, takes more than a reasonable amount of time Assist level: Moderate assist (Pt 50 - 74%)  Function - Comprehension Comprehension: Auditory Comprehension assist level: Understands complex 90% of the time/cues 10% of the time  Function - Expression Expression: Verbal Expression assist level: Expresses basic needs/ideas: With extra time/assistive device  Function - Social Interaction Social Interaction assist level: Interacts appropriately 75 - 89% of the time - Needs redirection for appropriate language or to initiate interaction.  Function - Problem Solving Problem solving assist level: Solves basic 90% of the time/requires cueing < 10% of the time  Function - Memory Memory assist level: Recognizes or recalls 90% of the time/requires cueing < 10% of the time Patient normally able to recall (first 3 days only): Staff names and faces, That he or she is in a hospital  Medical Problem List and Plan:  1. 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 Plan d/c in  am                                 2. DVT Prophylaxis/Anticoagulation: Coumadin therapy with Lovenox for bridging. Monitor for any signs of bleeding. INR goal 2-3. INR at goal 2/8 Plan repeat CTA of head and neck 2-3 months to decide duration of anticoagulation use  3. Pain Management: Tylenol as needed , hx of R knee pain, gets injections with Dr Ninfa Linden, add voltaren Headache, vascular =/- cervicogenic improved on topiramate 25mg  BID, will d/c home on this 4. Mood: Provide emotional support , CHronic anxiety increase Buspar to 7.5mg , neuropsych 5. Neuropsych: This patient is capable of making decisions on her own behalf.  6. Skin/Wound Care: Routine skin checks  7. Fluids/Electrolytes/Nutrition: Routine I&O with follow-up chemistries  8. Hypertension. Permissive hypertension. Patient on Norvasc 10 mg daily prior to admission.BP running in normal range off BP meds                            9. Tobacco abuse. Nicoderm patch. Provide counseling  10. Sinusitis. Complete course of Augmentin today  11. Hyperlipidemia. Lipitor  12. Decreased nutritional storage. . Dietary supplements.   14. Hypokalemia Resolved 15. Normocytic anemia. Continue iron supplementation Hgb mildly reduced at 11.4  16. History of gout. Monitor for any signs of flareup On prophyllactic colchicine 17. Constipation. Laxative assistance  18.  Mild hypoalbuminemia, prostat    19.  Bilateral foot pain with hx of gout, restart colchicine which pt used  prophyllactically at this point no evidence of flare, does have hallux valgus and pain with MTP ROM, order plastizote shoes which can be worn during gout flares  LOS (Days) 7 A FACE TO FACE EVALUATION WAS PERFORMED  Leanore Biggers E 10/21/2016, 7:06 AM

## 2016-10-21 NOTE — Progress Notes (Signed)
ANTICOAGULATION CONSULT NOTE - Follow Up Consult  Pharmacy Consult for lovenox and coumadin Indication: basilar artery thrombosis   Allergies  Allergen Reactions  . Tramadol Other (See Comments)    Per pt, she got depressed, moody, and had increased back pain when she took tramadol   Vital Signs: Temp: 98.6 F (37 C) (02/08 0458) Temp Source: Oral (02/08 0458) BP: 102/68 (02/08 0458) Pulse Rate: 85 (02/08 0458)  Labs:  Recent Labs  10/19/16 0513 10/20/16 0524 10/21/16 0508  LABPROT 20.9* 24.6* 23.0*  INR 1.78 2.17 2.00    Estimated Creatinine Clearance (by C-G formula based on SCr of 0.81 mg/dL) Female: 59.8 mL/min Female: 73.6 mL/min   Assessment: 71 yo female with basilar artery thrombosis is currently on therapeutic coumadin. INR 2.0 today, No s/s bleeding noted. Plan for discharge tomorrow.  Goal of Therapy:  Anti-Xa level 0.6-1 units/ml 4hrs after LMWH dose given; INR 2-3 Monitor platelets by anticoagulation protocol: Yes   Plan:  Warfarin7.5 mg today, then 5mg  daily from tomorrow. If goes home pt will likely require 7.5 mg one day a week, 5mg  all other days.  Daily INR and CBC as needed Monitor s/sx of bleeding  Maryanna Shape, PharmD, BCPS  Clinical Pharmacist  Pager: [phone]   10/21/16 1:16 PM

## 2016-10-21 NOTE — Progress Notes (Signed)
Occupational Therapy Session Note  Patient Details  Name: Mercedes Gardner MRN: [id number] Date of Birth: 19-Aug-1946  Today's Date: 10/21/2016 OT Individual Time: 1100-1200 OT Individual Time Calculation (min): 60 min    Short Term Goals: Week 1:  OT Short Term Goal 1 (Week 1): STG = LTGs due to ELOS   Skilled Therapeutic Interventions/Progress Updates:    Pt seen this session to practice skills needed to prepare for discharge home tomorrow of kitchen mobility and light laundry folding tasks.  Pt initially used bathroom with mod I.  Pt ambulated to kitchen with RW with S and practiced setting up coffee maker. She slid carafe across counter sink to maker and back as an adaptation instead of carrying it as she will always be using the walker. Also practiced reaching into fridge to retrieve items.  Worked on dynamic standing balance by standing at end of bed to fold linens.  Overall supervision with those tasks.  Pt then ambulated to gym to practice activities from her [facility] HEP: making chain of paper clips, shuffling cards, filling up medicine/ pill box, opening pill bottles, fastening small binder clips, etc.  Discussed the many [facility] activities she can engage in at home with her young grandson. Pt ambulated back to room, sat on bed for lunch, bed alarm set.  Therapy Documentation Precautions:  Precautions Precautions: Fall Precaution Comments: ataxic gait, L hemiparesis Restrictions Weight Bearing Restrictions: No    Vital Signs: Therapy Vitals Temp: 98.6 F (37 C) Temp Source: Oral Pulse Rate: 85 Resp: 16 BP: 102/68 Patient Position (if appropriate): Sitting Oxygen Therapy SpO2: 99 % O2 Device: Not Delivered Pain: Pain Assessment Pain Assessment: No/denies pain ADL:    See Function Navigator for Current Functional Status.   Therapy/Group: Individual Therapy  [place] 10/21/2016, 8:47 AM

## 2016-10-21 NOTE — Progress Notes (Signed)
Occupational Therapy Session Note  Patient Details  Name: Mercedes Gardner MRN: [id number] Date of Birth: [birth year]  Today's Date: 10/21/2016 OT Individual Time: 0730-0830 OT Individual Time Calculation (min): 60 min    Short Term Goals: Week 1:  OT Short Term Goal 1 (Week 1): STG = LTGs due to ELOS  Skilled Therapeutic Interventions/Progress Updates:    Completed ADL retraining at overall Mod I level.  Completed all mobility and transfers with RW with increased coordination and focus.  Educated pt on need to decrease distractions (ie talking, tv) when up and moving around to increase coordination and safety as pt is easily distracted both externally and internally.  Completed all bathing and dressing at Mod I level.  Simulated tub/shower transfer with use of tub bench.  Reiterated recommendation for supervision with tub/shower transfers and IADL tasks, to which pt reports understanding.  Completed 9 hole peg test with Lt: 50 seconds, improved only 5 seconds from eval.  Reiterated use of fine motor control handout upon d/c to continue to address decreased LUE fine motor control.  Therapy Documentation Precautions:  Precautions Precautions: Fall Precaution Comments: ataxic gait, L hemiparesis Restrictions Weight Bearing Restrictions: No General:   Vital Signs: Therapy Vitals Temp: 98.6 F (37 C) Temp Source: Oral Pulse Rate: 85 Resp: 16 BP: 102/68 Patient Position (if appropriate): Sitting Oxygen Therapy SpO2: 99 % O2 Device: Not Delivered Pain:  Pt with no c/o pain  See Function Navigator for Current Functional Status.   Therapy/Group: Individual Therapy  [name] 10/21/2016, 7:59 AM

## 2016-10-22 LAB — PROTIME-INR
INR: 2.23
Prothrombin Time: 25.1 seconds — ABNORMAL HIGH (ref 11.4–15.2)

## 2016-10-22 MED ORDER — BUSPIRONE HCL 7.5 MG PO TABS: 7.5000 mg | ORAL_TABLET | Freq: Two times a day (BID) | ORAL | 0 refills | Status: DC

## 2016-10-22 MED ORDER — ATORVASTATIN CALCIUM 80 MG PO TABS: 80.0000 mg | ORAL_TABLET | Freq: Every day | ORAL | 0 refills | Status: DC

## 2016-10-22 MED ORDER — FOLIC ACID 1 MG PO TABS: 1.0000 mg | ORAL_TABLET | Freq: Every day | ORAL | 0 refills | Status: AC

## 2016-10-22 MED ORDER — OMEPRAZOLE 20 MG PO CPDR: 20.0000 mg | DELAYED_RELEASE_CAPSULE | Freq: Every morning | ORAL | 0 refills | Status: DC

## 2016-10-22 MED ORDER — TOPIRAMATE 25 MG PO TABS
25.0000 mg | ORAL_TABLET | Freq: Every day | ORAL | 0 refills | Status: DC
Start: 2016-10-22 — End: 2016-12-06

## 2016-10-22 MED ORDER — DICLOFENAC SODIUM 1 % TD GEL: 4.0000 g | Freq: Four times a day (QID) | TRANSDERMAL | 0 refills | Status: DC

## 2016-10-22 MED ORDER — WARFARIN SODIUM 5 MG PO TABS: 5.0000 mg | ORAL_TABLET | Freq: Every day | ORAL | 1 refills | Status: DC

## 2016-10-22 MED ORDER — COLCHICINE 0.6 MG PO TABS: 0.6000 mg | ORAL_TABLET | Freq: Two times a day (BID) | ORAL | 0 refills | Status: DC

## 2016-10-22 MED ORDER — FERROUS SULFATE 325 (65 FE) MG PO TABS: 325.0000 mg | ORAL_TABLET | Freq: Two times a day (BID) | ORAL | 0 refills | Status: AC

## 2016-10-22 MED ORDER — ADULT MULTIVITAMIN W/MINERALS CH: 1.0000 | ORAL_TABLET | Freq: Every day | ORAL | Status: AC

## 2016-10-22 MED ORDER — NICOTINE 21 MG/24HR TD PT24: MEDICATED_PATCH | TRANSDERMAL | 0 refills | Status: DC

## 2016-10-22 NOTE — Progress Notes (Signed)
Subjective/Complaints:  Concerned about herbal supplement.  Reviewed ingredients, Has Vit K2  ROS Neg CP, SOB, N/V/D Objective: Vital Signs: Blood pressure (!) 114/56, pulse 88, temperature 97.9 F (36.6 C), temperature source Oral, resp. rate 18, weight 59.6 kg (131 lb 6.3 oz), SpO2 100 %. No results found. Results for orders placed or performed during the hospital encounter of 10/14/16 (from the past 72 hour(s))  Protime-INR     Status: Abnormal   Collection Time: 10/20/16  5:24 AM  Result Value Ref Range   Prothrombin Time 24.6 (H) 11.4 - 15.2 seconds   INR 2.17   Protime-INR     Status: Abnormal   Collection Time: 10/21/16  5:08 AM  Result Value Ref Range   Prothrombin Time 23.0 (H) 11.4 - 15.2 seconds   INR 2.00   Protime-INR     Status: Abnormal   Collection Time: 10/22/16  4:12 AM  Result Value Ref Range   Prothrombin Time 25.1 (H) 11.4 - 15.2 seconds   INR 2.23      HEENT: normal Cardio: RRR and no murmur Resp: CTA B/L and unlabored GI: BS positive and NT, ND Extremity:  Pulses positive and No Edema Skin:   Intact Neuro: Alert/Oriented, Normal Sensory, Abnormal Motor 3/5 LUE, 4- LLE, 5/5 on RIght side, Abnormal FMC Ataxic/ dec FMC and Dysarthric Musc/Skel:  Normal Gen NAD   Assessment/Plan: 1. 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  Stable for D/C today F/u PCP in 3-4 weeks F/u PM&R 2 weeks See D/C summary See D/C instructions  FIM: Function - Bathing Position: Shower Body parts bathed by patient: Right arm, Left arm, Chest, Abdomen, Front perineal area, Buttocks, Right upper leg, Left upper leg, Right lower leg, Left lower leg, Back Body parts bathed by helper: Back Bathing not applicable: Back Assist Level: More than reasonable time Set up : To obtain items  Function- Upper Body Dressing/Undressing What is the patient wearing?: Bra, Pull over  shirt/dress Bra - Perfomed by patient: Thread/unthread right bra strap, Hook/unhook bra (pull down sports bra), Thread/unthread left bra strap Pull over shirt/dress - Perfomed by patient: Thread/unthread right sleeve, Thread/unthread left sleeve, Put head through opening, Pull shirt over trunk Assist Level: More than reasonable time Set up : To obtain clothing/put away Function - Lower Body Dressing/Undressing What is the patient wearing?: Underwear, Pants, Non-skid slipper socks Position: Sitting EOB Underwear - Performed by patient: Thread/unthread right underwear leg, Thread/unthread left underwear leg, Pull underwear up/down Pants- Performed by patient: Thread/unthread right pants leg, Thread/unthread left pants leg, Pull pants up/down Non-skid slipper socks- Performed by patient: Don/doff right sock, Don/doff left sock Socks - Performed by patient: Don/doff right sock, Don/doff left sock Shoes - Performed by patient: Don/doff right shoe, Don/doff left shoe, Fasten right, Fasten left TED Hose - Performed by patient: Don/doff right TED hose, Don/doff left TED hose TED Hose - Performed by helper: Don/doff right TED hose, Don/doff left TED hose Assist for footwear: Independent Assist for lower body dressing: More than reasonable time Set up : To obtain clothing/put away, Don/doff TED stockings  Function - Toileting Toileting steps completed by patient: Adjust clothing prior to toileting, Performs perineal hygiene, Adjust clothing after toileting Toileting Assistive Devices: Grab bar or rail Assist level: More than reasonable time  Function - Air cabin crew transfer assistive device: Walker, Grab bar Assist level to toilet: No Help, no cues, assistive device, takes more than a  reasonable amount of time Assist level from toilet: No Help, no cues, assistive device, takes more than a reasonable amount of time  Function - Chair/bed transfer Chair/bed transfer method:  Ambulatory Chair/bed transfer assist level: No Help, no cues, assistive device, takes more than a reasonable amount of time Chair/bed transfer assistive device: Armrests, Walker  Function - Locomotion: Wheelchair Will patient use wheelchair at discharge?: No Wheelchair activity did not occur: N/A Function - Locomotion: Ambulation Assistive device: Walker-rolling Max distance: 150 Assist level: No help, No cues, assistive device, takes more than a reasonable amount of time Assist level: No help, No cues, assistive device, takes more than a reasonable amount of time Assist level: No help, No cues, assistive device, takes more than a reasonable amount of time Assist level: No help, No cues, assistive device, takes more than a reasonable amount of time Assist level: Supervision or verbal cues  Function - Comprehension Comprehension: Auditory Comprehension assist level: Understands complex 90% of the time/cues 10% of the time  Function - Expression Expression: Verbal Expression assist level: Expresses basic needs/ideas: With extra time/assistive device  Function - Social Interaction Social Interaction assist level: Interacts appropriately 75 - 89% of the time - Needs redirection for appropriate language or to initiate interaction.  Function - Problem Solving Problem solving assist level: Solves basic 90% of the time/requires cueing < 10% of the time  Function - Memory Memory assist level: Recognizes or recalls 90% of the time/requires cueing < 10% of the time Patient normally able to recall (first 3 days only): Staff names and faces, That he or she is in a hospital  Medical Problem List and Plan:  1. 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 Plan d/c today                                2. DVT Prophylaxis/Anticoagulation: Coumadin therapy with  Lovenox for bridging. Monitor for any signs of bleeding. INR goal 2-3. INR at goal 2/9- have told pt not to take supplement with Vit K Plan repeat CTA of head and neck 2-3 months to decide duration of anticoagulation use  3. Pain Management: Tylenol as needed , hx of R knee pain, gets injections with Dr Ninfa Linden, add voltaren Headache, vascular =/- cervicogenic improved on topiramate 25mg  BID, will d/c home on this 4. Mood: Provide emotional support , CHronic anxiety increase Buspar to 7.5mg , neuropsych 5. Neuropsych: This patient is capable of making decisions on her own behalf.  6. Skin/Wound Care: Routine skin checks  7. Fluids/Electrolytes/Nutrition: Routine I&O with follow-up chemistries  8. Hypertension. Permissive hypertension. Patient on Norvasc 10 mg daily prior to admission.BP running in normal range off BP meds                            9. Tobacco abuse. Nicoderm patch. Provide counseling  10. Sinusitis. Complete course of Augmentin today  11. Hyperlipidemia. Lipitor  12. Decreased nutritional storage. . Dietary supplements.    15. Normocytic anemia. Continue iron supplementation Hgb mildly reduced at 11.4  16. History of gout. Monitor for any signs of flareup On prophyllactic colchicine 17. Constipation. Laxative assistance  18.  Mild hypoalbuminemia, prostat    19.  Bilateral foot pain with hx of gout, restart colchicine which pt used prophyllactically  at this point no evidence of flare, does have hallux valgus and pain with MTP ROM, order plastizote shoes which can be worn during gout flares  LOS (Days) 8 A FACE TO FACE EVALUATION WAS PERFORMED  Mercedes Gardner,Mercedes Gardner 10/22/2016, 6:57 AM

## 2016-10-22 NOTE — Progress Notes (Signed)
Social Work  Discharge Note  The overall goal for the admission was met for:   Discharge location: Yes-HOME WITH SON WHO CAN PROVIDE INTERMITTENT SUPERVISION   Length of Stay: Yes-8 DAYS  Discharge activity level: Yes-SUPERVISION-MOD/I LEVEL  Home/community participation: Yes  Services provided included: MD, RD, PT, OT, SLP, RN, CM, Pharmacy and SW  Financial Services: Medicare and Medicaid  Follow-up services arranged: Outpatient: [facility]PT,OT,SP 2/13 12:45-2;45 PM AND 2/20 10:15-11:00, DME: [place] and Patient/Family has no preference for HH/DME agencies  Comments (or additional information):SPOKE WITH DAUGHTER, SISTER AND SON REGARDING PT'S CARE NEEDS. PT INSISTS ON OP THERAPIES DUE DOESN'T WANT HOME HEALTH TOO SMALL OF APARTMENT TO WORK IN. SON IS IN AND OUT SO CAN PROVIDE INTERMITTENT SUPERVISION. ALTHOUGH CHILDREN ANXIOUS ABOUT PT COMING HOME DID NOT COME IN AND ATTEND THERAPIES WITH HER LIKE THIS WORKER ASKED THEM TO DO. SCAT APPLICATION FAXED IN AND BEING PROCESSED.  Patient/Family verbalized understanding of follow-up arrangements: Yes  Individual responsible for coordination of the follow-up plan: SELF & April-DAUGHTER  Confirmed correct DME delivered: Elease Hashimoto 10/22/2016    Elease Hashimoto

## 2016-10-22 NOTE — Progress Notes (Signed)
Speech Language Pathology Discharge Summary  Patient Details  Name: Mercedes Gardner MRN: [id number] Date of Birth: [birth year]  Patient has met 4 of 4 long term goals.  Patient to discharge at overall Supervision;Min level.   Reasons goals not met: N/A   Clinical Impression/Discharge Summary: Patient has made functional gains and has met 4 of 4 LTG's this admission due to improved cognitive functioning and speech intelligibility. Currently, patient requires overall Min A to complete functional and familiar tasks safely in regards to selective attention, complex problem solving and recall of new information. Patient is also 90-100% intelligible at the sentence level with supervision-Min A verbal cues for utilization of a decreased speech rate. Patient education is complete and patient will discharge home with family. Patient would benefit from f/u SLP services to maximize her cognitive function and overall functional independence prior to discharge.   Care Partner:  Caregiver Able to Provide Assistance: Yes  Type of Caregiver Assistance: Physical;Cognitive  Recommendation:  Outpatient SLP;24 hour supervision/assistance  Rationale for SLP Follow Up: Maximize cognitive function and independence;Maximize functional communication   Equipment: N/A   Reasons for discharge: Treatment goals met;Discharged from hospital   Patient/Family Agrees with Progress Made and Goals Achieved: Yes     [place], [place] 10/22/2016, 6:53 AM

## 2016-10-22 NOTE — Progress Notes (Signed)
ANTICOAGULATION CONSULT NOTE - Follow Up Consult  Pharmacy Consult for coumadin Indication: basilar artery thrombosis   Allergies  Allergen Reactions  . Tramadol Other (See Comments)    Per pt, she got depressed, moody, and had increased back pain when she took tramadol   Vital Signs: Temp: 97.9 F (36.6 C) (02/09 0524) Temp Source: Oral (02/09 0524) BP: 114/56 (02/09 0524) Pulse Rate: 88 (02/09 0524)  Labs:  Recent Labs  10/20/16 0524 10/21/16 0508 10/22/16 0412  LABPROT 24.6* 23.0* 25.1*  INR 2.17 2.00 2.23    Estimated Creatinine Clearance (by C-G formula based on SCr of 0.81 mg/dL) Female: 59.8 mL/min Female: 73.6 mL/min   Assessment: 72 yo female with basilar artery thrombosis is currently on therapeutic coumadin. INR 2.23 today, No s/s bleeding noted. Plan for discharge today.  Goal of Therapy:  Anti-Xa level 0.6-1 units/ml 4hrs after LMWH dose given; INR 2-3 Monitor platelets by anticoagulation protocol: Yes   Plan:  Continue Warfarin 5mg  daily. Daily INR while in the hospital Monitor s/sx of bleeding  Maryanna Shape, PharmD, BCPS  Clinical Pharmacist  Pager: [phone]   10/22/16 1:48 PM

## 2016-10-26 ENCOUNTER — Ambulatory Visit: Payer: Medicare Other | Admitting: *Deleted

## 2016-10-26 ENCOUNTER — Ambulatory Visit: Payer: Medicare Other | Attending: Physical Medicine & Rehabilitation | Admitting: Physical Therapy

## 2016-10-26 DIAGNOSIS — M6281 Muscle weakness (generalized): Secondary | ICD-10-CM | POA: Insufficient documentation

## 2016-10-26 DIAGNOSIS — R41842 Visuospatial deficit: Secondary | ICD-10-CM | POA: Insufficient documentation

## 2016-10-26 DIAGNOSIS — R278 Other lack of coordination: Secondary | ICD-10-CM | POA: Insufficient documentation

## 2016-11-02 ENCOUNTER — Ambulatory Visit: Payer: Medicare Other | Admitting: Occupational Therapy

## 2016-11-02 DIAGNOSIS — R41842 Visuospatial deficit: Secondary | ICD-10-CM | POA: Diagnosis present

## 2016-11-02 DIAGNOSIS — R278 Other lack of coordination: Secondary | ICD-10-CM

## 2016-11-02 DIAGNOSIS — M6281 Muscle weakness (generalized): Secondary | ICD-10-CM | POA: Diagnosis present

## 2016-11-02 NOTE — Therapy (Signed)
[facility] [address] [place], [place], [address] Phone: [phone]   Fax:  [phone]  Occupational Therapy Evaluation  Patient Details  Name: Mercedes Gardner MRN: [id number] Date of Birth: 1946-02-25 Referring Provider: Dr. Alysia Penna  Encounter Date: 11/02/2016      OT End of Session - 11/02/16 1218    Visit Number 1   Number of Visits 9   Date for OT Re-Evaluation 11/30/16   Authorization Type MCR - G code needed   OT Start Time 1015   OT Stop Time 1103   OT Time Calculation (min) 48 min   Activity Tolerance Patient tolerated treatment well      Past Medical History:  Diagnosis Date  . Anemia   . Anxiety   . Arthritis    knees   . Dysrhythmia    heart skips a beat   . GERD (gastroesophageal reflux disease)   . Gout   . Hyperlipidemia April 2014  . Hypertension   . Shortness of breath dyspnea    due to pressure of cyst per patient     Past Surgical History:  Procedure Laterality Date  . CESAREAN SECTION     x3  . OVARIAN CYST SURGERY Right 1980's  . SALPINGOOPHORECTOMY Bilateral 02/06/2015   Procedure: Campbell Stall LAPAROTOMY/BILATERAL SALPINGO OOPHORECTOMY;  Surgeon: Everitt Amber, MD;  Location: [facility];  Service: Gynecology;  Laterality: Bilateral;    There were no vitals filed for this visit.      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 No/denies           [facility] OT Assessment - 11/02/16 0001      Assessment   Diagnosis CVA   Referring Provider Dr. Alysia Penna   Onset Date 10/11/16   Assessment (bilateral pontine and Rt cerebellar infarcts)    Prior Therapy inpatient rehab 10/14/16 - 10/22/16      Precautions   Precautions --  no driving     Restrictions   Weight Bearing Restrictions No     Balance Screen   Has the patient fallen in the past 6 months No   Has the patient had a decrease in activity level because of a fear of falling?  No   Is the patient reluctant to leave their home because of a fear of falling?  No     Home  Environment   Bathroom Shower/Tub Tub/Shower unit;Curtain   Home Equipment Tub bench;Walker - 2 wheels   Additional Comments Pt lives on 1st floor apt with 4 steps to enter   Lives With Son     Prior Function   Level of Independence Independent   Vocation Full time employment   Vocation Requirements early childhood teacher     ADL   Eating/Feeding Independent   Grooming Independent   Programmer, systems - Water engineer -  Hygiene Independent   Tub/Shower Transfer Modified independent   ADL comments Pt did not drive prior to CVA     IADL  Shopping Assistance for transportation  PT did not drive prior to CVA   Light Housekeeping Does personal laundry completely;Performs light daily tasks such as dishwashing, bed making   Meal Prep Plans, prepares and serves adequate meals independently   [place] on family or friends for transportation   Medication Management Is responsible for taking medication in correct dosages at correct time   Physiological scientist financial matters independently (budgets, writes checks, pays rent, bills goes to bank), collects and keeps track of income     Mobility   Mobility Status Comments using [employer]      Written Expression   Dominant Hand Right   Handwriting 90% legible     Vision - History   Baseline Vision Wears glasses only for reading  but reports needed glasses full time prior to CVA     Vision  Assessment   Eye Alignment Impaired (comment)   Ocular Range of Motion Restricted on looking up  Rt eye   Tracking/Visual Pursuits --  Lt eye appears slightly delayed but able to track all quadrants   Convergence Impaired (comment)  incr. bluriness   Diplopia Assessment Other (comment)  diplopia resolved but still has bluriness per pt report   Comment Rt mild ptosis      Cognition   Overall Cognitive Status Cognition to be further assessed in functional context PRN   Behaviors Impulsive  Pt verbose t/o evaluation - dtr reports premorbid     Observation/Other Assessments   Observations Daughter reports pt with increased repeating of things and word finding difficulties     Sensation   Light Touch Appears Intact     Coordination   9 Hole Peg Test Right;Left   Right 9 Hole Peg Test 23.54 sec   Left 9 Hole Peg Test 37. 25 sec   Coordination decr. gross motor coordination on Lt side as well     Edema   Edema none     ROM / Strength   AROM / PROM / Strength AROM     AROM   Overall AROM Comments BUE AROM WNL's. BUE MMT grossly 4/5     Hand Function   Right Hand Grip (lbs) 45 lbs   Left Hand Grip (lbs) 38 lbs                         OT Education - 11/02/16 1215    Education provided Yes   Education Details CVA warning signs and risk factors   Person(s) Educated Patient;Child(ren)   Methods Explanation;Handout   Comprehension Verbalized understanding             OT Long Term Goals - 11/02/16 1223      OT LONG TERM GOAL #1   Title Independent with vision and coordination HEP    Time 4   Period Weeks   Status New     OT LONG TERM GOAL #2   Title Improve coordination as evidenced by reducing speed on 9 hole peg test to 30 sec. or less Lt hand   Baseline Eval: 37.25 sec   Time 4   Period Weeks   Status New     OT LONG TERM GOAL #3   Title Pt to demo safety with cooking tasks by slowing down at mod I level    Baseline impulsive   Time 4    Period Weeks   Status New  Plan - 11/02/16 1219    Clinical Impression Statement Pt is a 71 y.o. female who presents to outpatient rehab s/p CVA (bilateral pontine and Rt cerebellar) on 10/11/16. Pt presents today with mild coordination deficits Lt hand and visual deficits and would benefit from O.T. to address these deficits   Rehab Potential Good   OT Frequency 2x / week   OT Duration 4 weeks  plus eval   OT Treatment/Interventions Self-care/ADL training;DME and/or AE instruction;Patient/family education;Therapeutic exercises;Therapeutic activities;Neuromuscular education;Cognitive remediation/compensation;Visual/perceptual remediation/compensation;Functional Mobility Training   Plan vision HEP, coordination HEP    Consulted and Agree with Plan of Care Patient;Family member/caregiver      Patient will benefit from skilled therapeutic intervention in order to improve the following deficits and impairments:  Decreased coordination, Decreased endurance, Decreased safety awareness, Decreased activity tolerance, Impaired UE functional use, Decreased strength, Impaired vision/preception, Decreased cognition  Visit Diagnosis: Other lack of coordination - Plan: Ot plan of care cert/re-cert  Visuospatial deficit - Plan: Ot plan of care cert/re-cert  Muscle weakness (generalized) - Plan: Ot plan of care cert/re-cert      G-Codes - [id number]    Functional Assessment Tool Used Other (comment)   Functional Assessment Tool Used 9 hole peg test: Lt = 37.25 sec   Functional Limitation Carrying, moving and handling objects   Carrying, Moving and Handling Objects Current Status [id number]) At least 20 percent but less than 40 percent impaired, limited or restricted   Carrying, Moving and Handling Objects Goal Status [id number]) At least 1 percent but less than 20 percent impaired, limited or restricted      Problem List Patient Active Problem List   Diagnosis Date Noted  .  Hemiparesis affecting left side as late effect of stroke ([place]) 10/14/2016  . Gait disturbance, post-stroke 10/14/2016  . Dysarthria   . Left-sided weakness   . Lung nodule   . Unintentional weight loss   . Cerebral infarction due to thrombosis of basilar artery ([place])   . Stroke-like symptoms 10/11/2016  . FTT (failure to thrive) in adult 10/11/2016  . Leukocytosis 10/11/2016  . Acute hypokalemia 10/11/2016  . Normocytic anemia 10/11/2016  . Cerebral thrombosis with cerebral infarction 10/11/2016  . Acute CVA (cerebrovascular accident) ([place]) 10/11/2016  . 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 Ischemic stroke ([place])   . Anxiety   . Hypokalemia   . Pelvic mass in female 12/26/2014  . Gout 12/24/2008  . ALLERGIC RHINITIS 12/20/2008  . KNEE PAIN, LEFT 12/20/2008  . TOBACCO ABUSE 07/29/2006  . HTN (hypertension) 07/29/2006  . HEMORRHOIDS 07/29/2006    Carey Bullocks, OTR/L 11/02/2016, 12:29 PM  [facility] [address] [place], [place], [address] Phone: [phone]   Fax:  [phone]  Name: Mercedes Gardner MRN: [id number] Date of Birth: [birth year]

## 2016-11-10 ENCOUNTER — Ambulatory Visit: Payer: Self-pay | Admitting: Neurology

## 2016-11-10 ENCOUNTER — Telehealth: Payer: Self-pay | Admitting: *Deleted

## 2016-11-10 ENCOUNTER — Ambulatory Visit: Payer: Medicare Other | Admitting: *Deleted

## 2016-11-10 NOTE — Telephone Encounter (Signed)
No showed new patient appointment. 

## 2016-11-11 ENCOUNTER — Encounter: Payer: Self-pay | Admitting: Neurology

## 2016-11-15 ENCOUNTER — Inpatient Hospital Stay: Payer: Medicare Other | Admitting: Physical Medicine & Rehabilitation

## 2016-11-16 ENCOUNTER — Ambulatory Visit: Payer: Medicare Other | Attending: Physical Medicine & Rehabilitation | Admitting: *Deleted

## 2016-11-16 ENCOUNTER — Ambulatory Visit: Payer: Medicare Other | Admitting: Physical Therapy

## 2016-11-16 ENCOUNTER — Ambulatory Visit: Payer: Medicare Other | Admitting: Occupational Therapy

## 2016-11-16 DIAGNOSIS — R278 Other lack of coordination: Secondary | ICD-10-CM

## 2016-11-16 DIAGNOSIS — R41842 Visuospatial deficit: Secondary | ICD-10-CM | POA: Insufficient documentation

## 2016-11-16 DIAGNOSIS — R2689 Other abnormalities of gait and mobility: Secondary | ICD-10-CM | POA: Insufficient documentation

## 2016-11-16 DIAGNOSIS — M6281 Muscle weakness (generalized): Secondary | ICD-10-CM | POA: Insufficient documentation

## 2016-11-16 DIAGNOSIS — R41841 Cognitive communication deficit: Secondary | ICD-10-CM | POA: Insufficient documentation

## 2016-11-16 NOTE — Patient Instructions (Signed)
Dorsiflexion: Resisted    Facing anchor, tubing around left foot, pull toward face.  Repeat __10-15__ times per set. Do __2__ sets per session. Do __2__ sessions per day.  Do this exercise in seated position - hold band down with right foot  Walk on heels along countertop - hold toes/foot up  -- do 3 reps  http://orth.exer.us/9   Copyright  VHI. All rights reserved.

## 2016-11-16 NOTE — Therapy (Signed)
[facility] [address] [place], [place], [address] Phone: [phone]   Fax:  [phone]  Occupational Therapy Treatment  Patient Details  Name: Mercedes Gardner MRN: [id number] Date of Birth: 1946/03/27 Referring Provider: Dr. Alysia Penna  Encounter Date: 11/16/2016      OT End of Session - 11/16/16 0836    Visit Number 2   Number of Visits 9   Date for OT Re-Evaluation 11/30/16   Authorization Type MCR - G code needed   OT Start Time 0800   OT Stop Time 0845   OT Time Calculation (min) 45 min   Activity Tolerance Patient tolerated treatment well      Past Medical History:  Diagnosis Date  . Anemia   . Anxiety   . Arthritis    knees   . Dysrhythmia    heart skips a beat   . GERD (gastroesophageal reflux disease)   . Gout   . Hyperlipidemia April 2014  . Hypertension   . Shortness of breath dyspnea    due to pressure of cyst per patient     Past Surgical History:  Procedure Laterality Date  . CESAREAN SECTION     x3  . OVARIAN CYST SURGERY Right 1980's  . SALPINGOOPHORECTOMY Bilateral 02/06/2015   Procedure: Campbell Stall LAPAROTOMY/BILATERAL SALPINGO OOPHORECTOMY;  Surgeon: Everitt Amber, MD;  Location: [facility];  Service: Gynecology;  Laterality: Bilateral;    There were no vitals filed for this visit.      Subjective Assessment - 11/16/16 0804    Subjective  My Rt eye is bothering me some   Pertinent History bilateral pontine, right cerebellar, right PCA infarct 10/11/16, HTN, depression   Limitations no driving   Patient Stated Goals get better   Currently in Pain? No/denies            [facility] OT Assessment - 11/16/16 0001      Observation/Other Assessments   Observations Pt dragging walker behind her in waiting room. Therapist cued pt to use walker correctly                  OT Treatments/Exercises (OP) - 11/16/16 0001      Fine Motor Coordination   Fine Motor  Coordination Small Pegboard   Small Pegboard Pt placing small pegs in pegboard Lt hand with min difficulty, while copying peg design at 100% accuracy   Other Fine Motor Exercises Pt issued coordination HEP - See pt instructions for details     Visual/Perceptual Exercises   Other Exercises Pt issued vision HEP - see pt instructions for details                OT Education - 11/16/16 0824    Education provided Yes   Education Details Coordination HEP, vision HEP   Person(s) Educated Patient   Methods Explanation;Demonstration;Handout   Comprehension Verbalized understanding;Returned demonstration             OT Long Term Goals - 11/16/16 0842      OT LONG TERM GOAL #1   Title Independent with vision and coordination HEP    Time 4   Period Weeks   Status On-going     OT LONG TERM GOAL #2   Title Improve coordination as evidenced by reducing speed on 9 hole peg test to 30 sec. or less Lt hand   Baseline Eval: 37.25 sec   Time 4   Period Weeks   Status On-going  OT LONG TERM GOAL #3   Title Pt to demo safety with cooking tasks by slowing down at mod I level    Baseline impulsive   Time 4   Period Weeks   Status New               Plan - 11/16/16 0840    Clinical Impression Statement Pt progressing with coordination and vision.    Rehab Potential Good   OT Frequency 2x / week   OT Duration 4 weeks   OT Treatment/Interventions Self-care/ADL training;DME and/or AE instruction;Patient/family education;Therapeutic exercises;Therapeutic activities;Neuromuscular education;Cognitive remediation/compensation;Visual/perceptual remediation/compensation;Functional Mobility Training   Plan review HEP's prn, continue coordination, environmental scanning    Consulted and Agree with Plan of Care Patient      Patient will benefit from skilled therapeutic intervention in order to improve the following deficits and impairments:  Decreased coordination, Decreased  endurance, Decreased safety awareness, Decreased activity tolerance, Impaired UE functional use, Decreased strength, Impaired vision/preception, Decreased cognition  Visit Diagnosis: Other lack of coordination  Visuospatial deficit    Problem List Patient Active Problem List   Diagnosis Date Noted  . Hemiparesis affecting left side as late effect of stroke ([place]) 10/14/2016  . Gait disturbance, post-stroke 10/14/2016  . Dysarthria   . Left-sided weakness   . Lung nodule   . Unintentional weight loss   . Cerebral infarction due to thrombosis of basilar artery ([place])   . Stroke-like symptoms 10/11/2016  . FTT (failure to thrive) in adult 10/11/2016  . Leukocytosis 10/11/2016  . Acute hypokalemia 10/11/2016  . Normocytic anemia 10/11/2016  . Cerebral thrombosis with cerebral infarction 10/11/2016  . Acute CVA (cerebrovascular accident) ([place]) 10/11/2016  . 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 Ischemic stroke ([place])   . Anxiety   . Hypokalemia   . Pelvic mass in female 12/26/2014  . Gout 12/24/2008  . ALLERGIC RHINITIS 12/20/2008  . KNEE PAIN, LEFT 12/20/2008  . TOBACCO ABUSE 07/29/2006  . HTN (hypertension) 07/29/2006  . HEMORRHOIDS 07/29/2006    Carey Bullocks, OTR/L 11/16/2016, 8:44 AM  [facility] [address] [place], [place], [address] Phone: [phone]   Fax:  [phone]  Name: Mercedes Gardner MRN: [id number] Date of Birth: 25-Jul-1946

## 2016-11-16 NOTE — Patient Instructions (Signed)
  Coordination Activities  Perform the following activities for 10 minutes 1-2 times per day with left hand(s).   Rotate ball in fingertips (clockwise and counter-clockwise).  Toss ball in air and catch with the same hand.  Flip cards 1 at a time as fast as you can.  Deal cards with your thumb (Hold deck in hand and push card off top with thumb).  Rotate card in hand (clockwise and counter-clockwise).  Pick up coins, buttons, marbles, paper clips, dried beans/pasta of different sizes and place in container.  Pick up coins one at a time until you get 5 in your hand, then move coins from palm to fingertips to stack one at a time.  Screw together nuts and bolts, then unfasten.   Vision HEP:  Perform at least 3 times per day. Stop if your eye becomes fatigued or hurts and try again later.  1. Hold a small object/card in front of you.  Hold it in the middle at arm's length away.    2. Cover your LEFT eye and look at the object with your RIGHT eye.  3. Slowly move the object side to side in front of you while continuing to watch it with your RIGHT eye.  4.  Remember to keep your head still and only move your eye.  5.  Repeat 5-10 times.  6.  Then, move object up and down while watching it 5-10 times.  7. Cover your RIGHT eye and look at the object with your LEFT eye while you repeat #1-6 above.  8.  Now, uncover both eyes and try to focus on the object while holding it in the middle.    9. Repeat #1-6 above with both eyes moving slowly and only in the range that you can keep the image 1.

## 2016-11-16 NOTE — Therapy (Signed)
[facility] [address] [place], [place], [address] Phone: [phone]   Fax:  [phone]  Speech Language Pathology Evaluation  Patient Details  Name: Mercedes Gardner MRN: [id number] Date of Birth: March 19, 1946 Referring Provider: Dr. Letta Pate  Encounter Date: 11/16/2016      End of Session - 11/16/16 1004    Visit Number 1   Number of Visits 1   SLP Start Time 0935   SLP Stop Time  1000   SLP Time Calculation (min) 25 min   Activity Tolerance Patient tolerated treatment well      Past Medical History:  Diagnosis Date  . Anemia   . Anxiety   . Arthritis    knees   . Dysrhythmia    heart skips a beat   . GERD (gastroesophageal reflux disease)   . Gout   . Hyperlipidemia April 2014  . Hypertension   . Shortness of breath dyspnea    due to pressure of cyst per patient     Past Surgical History:  Procedure Laterality Date  . CESAREAN SECTION     x3  . OVARIAN CYST SURGERY Right 1980's  . SALPINGOOPHORECTOMY Bilateral 02/06/2015   Procedure: Campbell Stall LAPAROTOMY/BILATERAL SALPINGO OOPHORECTOMY;  Surgeon: Everitt Amber, MD;  Location: [facility];  Service: Gynecology;  Laterality: Bilateral;    There were no vitals filed for this visit.      Subjective Assessment - 11/16/16 0935    Subjective Ive always talked fast, when I get tired my speech drags a little, but I'm ok. Im trying to slow down   Currently in Pain? No/denies            SLP Evaluation [facility] - 11/16/16 0935      SLP Visit Information   SLP Received On 11/16/16   Referring Provider Dr. Letta Pate   Onset Date October 11, 2016   Medical Diagnosis right CVA, HTN, HLD, tobacco abuse     Subjective   Subjective Pt reports she has always talked fast. 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 See above for PMH   Behavioral/Cognition [facility]   Mobility Status ambulates with walker. 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 29/30 on MoCA     Auditory Comprehension   Overall Auditory Comprehension Appears within functional limits for tasks assessed     Visual Recognition/Discrimination   Discrimination Within Function Limits     Reading Comprehension   Reading Status Within funtional limits     Expression   Primary Mode of Expression Verbal     Verbal Expression   Overall Verbal Expression Appears within functional limits for tasks assessed     Written Expression   Dominant Hand Right     Oral Motor/Sensory Function   Overall Oral Motor/Sensory Function Appears within functional limits for tasks assessed     Motor Speech  Overall Motor Speech Appears within functional limits for tasks assessed  pt speaks at a fast rate, and always has per pt report   Motor Planning Within functional limits     Individuals Consulted   Consulted and Agree with Results and Recommendations Patient         SLP Education - December 14, 2016 1003    Education provided Yes   Education Details test results, improvement since CIR stay   Person(s) Educated Patient   Methods Explanation;Demonstration   Comprehension Verbalized understanding           Plan - December 14, 2016 1004    Clinical Impression Statement The Montreal Cognitive Assessment (MoCA) was administered. Pt scored 29/20 (n=26+/30), indicating performance within functional limits for this assessment and  pt level of education. This score also indicates improvement since rehab stay, when MoCA score was 22/30. Pt was also able to complete multiprocess reasoning subtests of the SCATBI with minimal difficulty. Pt appears to be impulsive, which is reported to be baseline behavior for her. Her speech is fully intelligible, although rapid in rate. Pt reports she is trying to slow down her speech rate, as it "drags" when she is tired. Pt is currently managing finances and medications without difficulty per her report. Given current level of independence and assessment results today, skilled ST intervention is not recommended at this time, but remains available if needs arise.    Speech Therapy Frequency One time visit   Consulted and Agree with Plan of Care Patient      Patient will benefit from skilled therapeutic intervention in order to improve the following deficits and impairments:   Cognitive communication deficit      G-Codes - Dec 14, 2016 1009    Functional Assessment Tool Used asha noms, clinical judgment, MoCA   Functional Limitations Memory   Memory Current Status [phone]) At least 1 percent but less than 20 percent impaired, limited or restricted   Memory Goal Status [id number]) At least 1 percent but less than 20 percent impaired, limited or restricted   Memory Discharge Status (G9170) At least 1 percent but less than 20 percent impaired, limited or restricted      Problem List Patient Active Problem List   Diagnosis Date Noted  . Hemiparesis affecting left side as late effect of stroke ([place]) 10/14/2016  . Gait disturbance, post-stroke 10/14/2016  . Dysarthria   . Left-sided weakness   . Lung nodule   . Unintentional weight loss   . Cerebral infarction due to thrombosis of basilar artery ([place])   . Stroke-like symptoms 10/11/2016  . FTT (failure to thrive) in adult 10/11/2016  . Leukocytosis 10/11/2016  . Acute hypokalemia 10/11/2016  . Normocytic anemia 10/11/2016  . Cerebral  thrombosis with cerebral infarction 10/11/2016  . Acute CVA (cerebrovascular accident) ([place]) 10/11/2016  . 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 Ischemic stroke ([place])   . Anxiety   . Hypokalemia   . Pelvic mass in female 12/26/2014  . Gout 12/24/2008  . ALLERGIC RHINITIS 12/20/2008  . KNEE PAIN, LEFT 12/20/2008  . TOBACCO ABUSE 07/29/2006  . HTN (hypertension) 07/29/2006  . HEMORRHOIDS 07/29/2006   Maxamillion Banas B. [place], MSP, CCC-SLP  Shonna Chock 14-Dec-2016, 10:11 AM  [facility] [address] [place], [place], [address] Phone: [phone]   Fax:  [phone]  Name: Mercedes Gardner MRN: [id number] Date of Birth: [birth year]

## 2016-11-17 ENCOUNTER — Encounter: Payer: Self-pay | Admitting: Physical Therapy

## 2016-11-17 NOTE — Therapy (Signed)
[facility] [address] [place], [place], [address] Phone: [phone]   Fax:  [phone]  Physical Therapy Evaluation  Patient Details  Name: KASSADEE CARAWAN MRN: [id number] Date of Birth: [birth year] Referring Provider: Dr. Alysia Penna  Encounter Date: 11/16/2016      PT End of Session - 11/17/16 2110    Visit Number 1   Number of Visits 5   Date for PT Re-Evaluation 12/03/16   Authorization Type Medicare   Authorization Time Period 11-16-16 - 01-15-17   PT Start Time 0850   PT Stop Time 0931   PT Time Calculation (min) 41 min      Past Medical History:  Diagnosis Date  . Anemia   . Anxiety   . Arthritis    knees   . Dysrhythmia    heart skips a beat   . GERD (gastroesophageal reflux disease)   . Gout   . Hyperlipidemia April 2014  . Hypertension   . Shortness of breath dyspnea    due to pressure of cyst per patient     Past Surgical History:  Procedure Laterality Date  . CESAREAN SECTION     x3  . OVARIAN CYST SURGERY Right 1980's  . SALPINGOOPHORECTOMY Bilateral 02/06/2015   Procedure: Campbell Stall LAPAROTOMY/BILATERAL SALPINGO OOPHORECTOMY;  Surgeon: Everitt Amber, MD;  Location: [facility];  Service: Gynecology;  Laterality: Bilateral;    There were no vitals filed for this visit.       Subjective Assessment - 11/17/16 2059    Subjective Pt states she is doing much better - uses walker when she goes out but wants to be able to safely walk without it; states she wants to go back to work at [employer]   Pertinent History Gout, HTN   Patient Stated Goals walk without walker, be able to return to work            [facility] PT Assessment - 11/17/16 0001      Assessment   Medical Diagnosis R CVA   Referring Provider Dr. Alysia Penna   Onset Date/Surgical Date 10/10/16   Prior Therapy Inpatient [facility]     Precautions   Precautions Other (comment)     Restrictions    Weight Bearing Restrictions No     Balance Screen   Has the patient fallen in the past 6 months No   Has the patient had a decrease in activity level because of a fear of falling?  No   Is the patient reluctant to leave their home because of a fear of falling?  No     Home Environment   Living Environment Private residence   Type of [place] Access Stairs to enter   Entrance Stairs-Number of Steps 3   Entrance Stairs-Rails Right   Home Layout Two level     Prior Function   Level of Independence Independent   Vocation Requirements early childhood teacher     Strength   Right Hip Flexion 5/5   Left Hip Flexion 4+/5   Right Knee Flexion 5/5   Right Knee Extension 5/5   Left Knee Flexion 5/5   Left Knee Extension 5/5   Right Ankle Dorsiflexion 5/5   Right Ankle Plantar Flexion 5/5   Left Ankle Dorsiflexion 5/5   Left Ankle Plantar Flexion 5/5     Ambulation/Gait   Ambulation/Gait Yes   Ambulation/Gait Assistance 6: Modified independent (Device/Increase time)   Ambulation  Distance (Feet) 100 Feet   Assistive device None   Gait Pattern Within Functional Limits   Ambulation Surface Level;Indoor   Gait velocity 9.69 secs= 3.38 ft/sec      Standardized Balance Assessment   Standardized Balance Assessment Berg Balance Test;Dynamic Gait Index     Berg Balance Test   Sit to Stand Able to stand without using hands and stabilize independently   Standing Unsupported Able to stand safely 2 minutes   Sitting with Back Unsupported but Feet Supported on Floor or Stool Able to sit safely and securely 2 minutes   Stand to Sit Sits safely with minimal use of hands   Transfers Able to transfer safely, minor use of hands   Standing Unsupported with Eyes Closed Able to stand 10 seconds safely   Standing Ubsupported with Feet Together Able to place feet together independently and stand 1 minute safely   From Standing, Reach Forward with Outstretched Arm Can reach confidently >25 cm  (10")   From Standing Position, Pick up Object from Floor Able to pick up shoe safely and easily   From Standing Position, Turn to Look Behind Over each Shoulder Looks behind from both sides and weight shifts well   Turn 360 Degrees Able to turn 360 degrees safely in 4 seconds or less   Standing Unsupported, Alternately Place Feet on Step/Stool Able to stand independently and safely and complete 8 steps in 20 seconds   Standing Unsupported, One Foot in Front Able to place foot tandem independently and hold 30 seconds   Standing on One Leg Able to lift leg independently and hold 5-10 seconds   Total Score 55     Dynamic Gait Index   Level Surface Normal   Change in Gait Speed Normal   Gait with Horizontal Head Turns Mild Impairment   Gait with Vertical Head Turns Mild Impairment   Gait and Pivot Turn Normal   Step Over Obstacle Normal   Step Around Obstacles Normal   Steps Mild Impairment   Total Score 21     Timed Up and Go Test   Normal TUG (seconds) 10.87                           PT Education - 11/17/16 2112    Education provided Yes   Education Details instructed in Lt dorsiflexor strengthening with green theraband and amb. on heels for strengthening   Person(s) Educated Patient   Methods Explanation;Demonstration;Handout   Comprehension Verbalized understanding;Returned demonstration                    Plan - 11/17/16 2115    Clinical Impression Statement Pt is a 71 year old lady s/p Rt CVA on 10-10-16.  Pt presents with decreased left eccentric dorsiflexor strength, resulting in foot slap in stance phase of gait.  Pt presents with minimally decreased standing balance deficits with scores on BERG and DGI not being indicative of fall risk. Pt presents with decreased high level balance skills and decreased balance noted with head turns during gait.  PMH includes HTN and gout.   Rehab Potential Good   PT Frequency 2x / week   PT Duration 2 weeks    PT Treatment/Interventions ADLs/Self Care Home Management;Gait training;Stair training;Therapeutic activities;Therapeutic exercise;Balance training;Neuromuscular re-education;Patient/family education   PT Next Visit Plan check the 2 exs given for HEP - resisted dorsiflexion and amb. on heels:  high level balance activities, multi-tasking with gait;  gait training carrying object to simulate work activities   PT [place] left dorsiflexion with green therband, amb. on heels   Consulted and Agree with Plan of Care Patient      Patient will benefit from skilled therapeutic intervention in order to improve the following deficits and impairments:  Decreased balance, Abnormal gait, Decreased strength  Visit Diagnosis: Muscle weakness (generalized) - Plan: PT plan of care cert/re-cert  Other abnormalities of gait and mobility - Plan: PT plan of care cert/re-cert      G-Codes - 42/59/56 12-11-2124    Functional Assessment Tool Used (Outpatient Only) DGI 22/24:  Berg score 55/56   Functional Limitation Mobility: Walking and moving around   Mobility: Walking and Moving Around Current Status [phone]) At least 20 percent but less than 40 percent impaired, limited or restricted   Mobility: Walking and Moving Around Goal Status [phone]) At least 1 percent but less than 20 percent impaired, limited or restricted       Problem List Patient Active Problem List   Diagnosis Date Noted  . Hemiparesis affecting left side as late effect of stroke ([place]) 10/14/2016  . Gait disturbance, post-stroke 10/14/2016  . Dysarthria   . Left-sided weakness   . Lung nodule   . Unintentional weight loss   . Cerebral infarction due to thrombosis of basilar artery ([place])   . Stroke-like symptoms 10/11/2016  . FTT (failure to thrive) in adult 10/11/2016  . Leukocytosis 10/11/2016  . Acute hypokalemia 10/11/2016  . Normocytic anemia 10/11/2016  . Cerebral thrombosis with cerebral infarction 10/11/2016  . Acute CVA  (cerebrovascular accident) ([place]) 10/11/2016  . 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 Ischemic stroke ([place])   . Anxiety   . Hypokalemia   . Pelvic mass in female 12/26/2014  . Gout 12/24/2008  . ALLERGIC RHINITIS 12/20/2008  . KNEE PAIN, LEFT 12/20/2008  . TOBACCO ABUSE 07/29/2006  . HTN (hypertension) 07/29/2006  . HEMORRHOIDS 07/29/2006    Alda Lea, PT 11/17/2016, 9:30 PM  [facility] [address] [place], [place], [address] Phone: [phone]   Fax:  [phone]  Name: TWILLA KHOURI MRN: [id number] Date of Birth: Sep 01, 1946

## 2016-11-18 ENCOUNTER — Ambulatory Visit: Payer: Medicare Other | Admitting: Occupational Therapy

## 2016-11-23 ENCOUNTER — Encounter: Payer: Medicare Other | Admitting: *Deleted

## 2016-11-23 ENCOUNTER — Ambulatory Visit: Payer: Medicare Other | Admitting: Occupational Therapy

## 2016-11-23 ENCOUNTER — Ambulatory Visit: Payer: Medicare Other | Admitting: Physical Therapy

## 2016-11-25 ENCOUNTER — Telehealth: Payer: Self-pay | Admitting: Occupational Therapy

## 2016-11-25 ENCOUNTER — Encounter: Payer: Medicare Other | Admitting: Speech Pathology

## 2016-11-25 ENCOUNTER — Ambulatory Visit: Payer: Medicare Other | Admitting: Physical Therapy

## 2016-11-25 ENCOUNTER — Ambulatory Visit: Payer: Medicare Other | Admitting: Occupational Therapy

## 2016-11-25 NOTE — Telephone Encounter (Signed)
Called patient and left message re: missed appointments and to call outpatient center back with contact number provided. Therapist also left message re: next scheduled appt

## 2016-11-29 ENCOUNTER — Observation Stay (HOSPITAL_COMMUNITY): Payer: Medicare Other

## 2016-11-29 ENCOUNTER — Encounter (HOSPITAL_COMMUNITY): Payer: Self-pay

## 2016-11-29 ENCOUNTER — Emergency Department (HOSPITAL_COMMUNITY): Payer: Medicare Other

## 2016-11-29 ENCOUNTER — Inpatient Hospital Stay (HOSPITAL_COMMUNITY)
Admission: EM | Admit: 2016-11-29 | Discharge: 2016-12-01 | DRG: 068 | Disposition: A | Discharge status: Home / Self Care | Payer: Medicare Other | Attending: Family Medicine | Admitting: Family Medicine

## 2016-11-29 DIAGNOSIS — R4781 Slurred speech: Secondary | ICD-10-CM | POA: Diagnosis present

## 2016-11-29 DIAGNOSIS — F419 Anxiety disorder, unspecified: Secondary | ICD-10-CM | POA: Diagnosis not present

## 2016-11-29 DIAGNOSIS — Z7951 Long term (current) use of inhaled steroids: Secondary | ICD-10-CM

## 2016-11-29 DIAGNOSIS — I10 Essential (primary) hypertension: Secondary | ICD-10-CM | POA: Diagnosis present

## 2016-11-29 DIAGNOSIS — Z8673 Personal history of transient ischemic attack (TIA), and cerebral infarction without residual deficits: Secondary | ICD-10-CM | POA: Diagnosis not present

## 2016-11-29 DIAGNOSIS — R2990 Unspecified symptoms and signs involving the nervous system: Secondary | ICD-10-CM

## 2016-11-29 DIAGNOSIS — I651 Occlusion and stenosis of basilar artery: Principal | ICD-10-CM | POA: Diagnosis present

## 2016-11-29 DIAGNOSIS — F17210 Nicotine dependence, cigarettes, uncomplicated: Secondary | ICD-10-CM | POA: Diagnosis present

## 2016-11-29 DIAGNOSIS — Z833 Family history of diabetes mellitus: Secondary | ICD-10-CM

## 2016-11-29 DIAGNOSIS — K219 Gastro-esophageal reflux disease without esophagitis: Secondary | ICD-10-CM | POA: Diagnosis present

## 2016-11-29 DIAGNOSIS — G459 Transient cerebral ischemic attack, unspecified: Secondary | ICD-10-CM | POA: Diagnosis not present

## 2016-11-29 DIAGNOSIS — R402142 Coma scale, eyes open, spontaneous, at arrival to emergency department: Secondary | ICD-10-CM | POA: Diagnosis present

## 2016-11-29 DIAGNOSIS — Z8249 Family history of ischemic heart disease and other diseases of the circulatory system: Secondary | ICD-10-CM

## 2016-11-29 DIAGNOSIS — I6302 Cerebral infarction due to thrombosis of basilar artery: Secondary | ICD-10-CM | POA: Diagnosis not present

## 2016-11-29 DIAGNOSIS — M109 Gout, unspecified: Secondary | ICD-10-CM | POA: Diagnosis present

## 2016-11-29 DIAGNOSIS — Z809 Family history of malignant neoplasm, unspecified: Secondary | ICD-10-CM

## 2016-11-29 DIAGNOSIS — M170 Bilateral primary osteoarthritis of knee: Secondary | ICD-10-CM | POA: Diagnosis present

## 2016-11-29 DIAGNOSIS — R402252 Coma scale, best verbal response, oriented, at arrival to emergency department: Secondary | ICD-10-CM | POA: Diagnosis present

## 2016-11-29 DIAGNOSIS — R402362 Coma scale, best motor response, obeys commands, at arrival to emergency department: Secondary | ICD-10-CM | POA: Diagnosis present

## 2016-11-29 DIAGNOSIS — E785 Hyperlipidemia, unspecified: Secondary | ICD-10-CM | POA: Diagnosis present

## 2016-11-29 DIAGNOSIS — Z7901 Long term (current) use of anticoagulants: Secondary | ICD-10-CM

## 2016-11-29 DIAGNOSIS — E43 Unspecified severe protein-calorie malnutrition: Secondary | ICD-10-CM | POA: Insufficient documentation

## 2016-11-29 DIAGNOSIS — Z888 Allergy status to other drugs, medicaments and biological substances status: Secondary | ICD-10-CM

## 2016-11-29 LAB — CBC
HEMATOCRIT: 37.2 % (ref 36.0–46.0)
Hemoglobin: 12.1 g/dL (ref 12.0–15.0)
MCH: 30.6 pg (ref 26.0–34.0)
MCHC: 32.5 g/dL (ref 30.0–36.0)
MCV: 94.2 fL (ref 78.0–100.0)
PLATELETS: 212 10*3/uL (ref 150–400)
RBC: 3.95 MIL/uL (ref 3.87–5.11)
RDW: 13.1 % (ref 11.5–15.5)
WBC: 6.9 10*3/uL (ref 4.0–10.5)

## 2016-11-29 LAB — COMPREHENSIVE METABOLIC PANEL
ALT: 36 U/L (ref 14–54)
AST: 29 U/L (ref 15–41)
Albumin: 3.9 g/dL (ref 3.5–5.0)
Alkaline Phosphatase: 86 U/L (ref 38–126)
Anion gap: 6 (ref 5–15)
BUN: 19 mg/dL (ref 6–20)
CHLORIDE: 111 mmol/L (ref 101–111)
CO2: 25 mmol/L (ref 22–32)
CREATININE: 0.88 mg/dL (ref 0.44–1.00)
Calcium: 10.4 mg/dL — ABNORMAL HIGH (ref 8.9–10.3)
GFR calc non Af Amer: >60 mL/min (ref >60)
Glucose, Bld: 97 mg/dL (ref 65–99)
Potassium: 3.6 mmol/L (ref 3.5–5.1)
SODIUM: 142 mmol/L (ref 135–145)
Total Bilirubin: 0.4 mg/dL (ref 0.3–1.2)
Total Protein: 7.2 g/dL (ref 6.5–8.1)

## 2016-11-29 LAB — DIFFERENTIAL
BASOS ABS: 0 10*3/uL (ref 0.0–0.1)
BASOS PCT: 0 %
Eosinophils Absolute: 0.2 10*3/uL (ref 0.0–0.7)
Eosinophils Relative: 3 %
LYMPHS PCT: 38 %
Lymphs Abs: 2.7 10*3/uL (ref 0.7–4.0)
MONO ABS: 0.4 10*3/uL (ref 0.1–1.0)
MONOS PCT: 5 %
NEUTROS ABS: 3.7 10*3/uL (ref 1.7–7.7)
Neutrophils Relative %: 54 %

## 2016-11-29 LAB — I-STAT CHEM 8, ED
BUN: 23 mg/dL — ABNORMAL HIGH (ref 6–20)
CHLORIDE: 107 mmol/L (ref 101–111)
Calcium, Ion: 1.41 mmol/L — ABNORMAL HIGH (ref 1.15–1.40)
Creatinine, Ser: 0.8 mg/dL (ref 0.44–1.00)
Glucose, Bld: 97 mg/dL (ref 65–99)
HEMATOCRIT: 37 % (ref 36.0–46.0)
HEMOGLOBIN: 12.6 g/dL (ref 12.0–15.0)
POTASSIUM: 3.8 mmol/L (ref 3.5–5.1)
SODIUM: 144 mmol/L (ref 135–145)
TCO2: 26 mmol/L (ref 0–100)

## 2016-11-29 LAB — PROTIME-INR
INR: 1.26
PROTHROMBIN TIME: 15.9 s — AB (ref 11.4–15.2)

## 2016-11-29 LAB — I-STAT TROPONIN, ED: Troponin i, poc: 0 ng/mL (ref 0.00–0.08)

## 2016-11-29 LAB — APTT: aPTT: 28 seconds (ref 24–36)

## 2016-11-29 LAB — ETHANOL

## 2016-11-29 MED ORDER — WARFARIN SODIUM 5 MG PO TABS: 5.0000 mg | ORAL_TABLET | Freq: Every day | ORAL | Status: DC

## 2016-11-29 MED ORDER — ACETAMINOPHEN 325 MG PO TABS
650.0000 mg | ORAL_TABLET | ORAL | Status: DC | PRN
  Administered 2016-11-30 – 2016-12-01 (×4): 650 mg via ORAL
  Filled 2016-11-29 (×4): qty 2

## 2016-11-29 MED ORDER — ACETAMINOPHEN 650 MG RE SUPP: 650.0000 mg | RECTAL | Status: DC | PRN

## 2016-11-29 MED ORDER — AMLODIPINE BESYLATE 10 MG PO TABS
10.0000 mg | ORAL_TABLET | Freq: Every day | ORAL | Status: DC
  Administered 2016-11-30 – 2016-12-01 (×2): 10 mg via ORAL
  Filled 2016-11-29 (×2): qty 1

## 2016-11-29 MED ORDER — COLCHICINE 0.6 MG PO TABS
0.6000 mg | ORAL_TABLET | Freq: Two times a day (BID) | ORAL | Status: DC
Start: 2016-11-30 — End: 2016-12-01
  Administered 2016-11-30 – 2016-12-01 (×3): 0.6 mg via ORAL
  Filled 2016-11-29 (×3): qty 1

## 2016-11-29 MED ORDER — ACETAMINOPHEN 160 MG/5ML PO SOLN: 650.0000 mg | ORAL | Status: DC | PRN

## 2016-11-29 MED ORDER — ROSUVASTATIN CALCIUM 20 MG PO TABS
20.0000 mg | ORAL_TABLET | Freq: Every day | ORAL | Status: DC
  Administered 2016-11-30 – 2016-12-01 (×2): 20 mg via ORAL
  Filled 2016-11-29 (×2): qty 1
  Filled 2016-11-29: qty 2

## 2016-11-29 MED ORDER — BUSPIRONE HCL 15 MG PO TABS
7.5000 mg | ORAL_TABLET | Freq: Two times a day (BID) | ORAL | Status: DC
Start: 2016-11-30 — End: 2016-12-01
  Administered 2016-11-30 – 2016-12-01 (×3): 7.5 mg via ORAL
  Filled 2016-11-29: qty 1
  Filled 2016-11-29: qty 2
  Filled 2016-11-29 (×2): qty 1
  Filled 2016-11-29 (×2): qty 2

## 2016-11-29 MED ORDER — WARFARIN SODIUM 2 MG PO TABS
2.0000 mg | ORAL_TABLET | Freq: Once | ORAL | Status: AC
  Administered 2016-11-30: 2 mg via ORAL
  Filled 2016-11-29: qty 1

## 2016-11-29 MED ORDER — HEPARIN (PORCINE) IN NACL 100-0.45 UNIT/ML-% IJ SOLN
900.0000 [IU]/h | INTRAMUSCULAR | Status: DC
  Administered 2016-11-29: 750 [IU]/h via INTRAVENOUS
  Filled 2016-11-29: qty 250

## 2016-11-29 MED ORDER — BUSPIRONE HCL 15 MG PO TABS
7.5000 mg | ORAL_TABLET | Freq: Once | ORAL | Status: AC
  Administered 2016-11-29: 7.5 mg via ORAL
  Filled 2016-11-29: qty 1

## 2016-11-29 MED ORDER — TOPIRAMATE 25 MG PO TABS
25.0000 mg | ORAL_TABLET | Freq: Every day | ORAL | Status: DC
  Administered 2016-11-30 – 2016-12-01 (×2): 25 mg via ORAL
  Filled 2016-11-29 (×2): qty 1

## 2016-11-29 MED ORDER — WARFARIN - PHARMACIST DOSING INPATIENT: Freq: Every day | Status: DC

## 2016-11-29 NOTE — ED Notes (Signed)
ED Provider at bedside. 

## 2016-11-29 NOTE — ED Notes (Signed)
Admitting provider at bedside.

## 2016-11-29 NOTE — ED Triage Notes (Signed)
Per [facility], Pt started to have slurred speech and dizziness at 1700. Pt reported that the episode subsided by 1800. No deficits noted by EMS. Pt had Coumadin levels changed last week and they have not been reassessed. Pt denies pain. Pt has been talking on her cell phone with EMS.   20 Gauge in R FA   Vitals per EMS: 120/70, 104 NSR, 100% on RA, 146 CBG

## 2016-11-29 NOTE — H&P (Signed)
History and Physical    BERNETTA SUTLEY XNA:[id number] DOB: 1945/12/03 DOA: 11/29/2016  PCP: Philis Fendt, MD   Patient coming from: Home via EMS  Chief Complaint: Slurred speech, concern for recurrent CVA  HPI: Mercedes Gardner is a 71 y.o. woman with a history of CVA in late January 2018 (presented with slurred speech; was found to have basilar artery thrombus at that time, anticoagulated with warfarin), HTN, HLD, GERD, OA, and chronic headache who developed slurred speech today, which lasted several minutes.  She called her son, who had difficulty understanding what she was saying.  He urged her to present to the ED for evaluation since symptoms were similar to prior stroke presentation.  The patient came in via EMS.  She has had intermittent headache which she relates to her sinuses and allergies.  No blurred or double vision.  No LOC.  No falls.  No head trauma.  No confusion.  No chest pain, shortness of breath, nausea, or vomiting.  No signs of active blood loss.  She reports recent dose adjustment of her warfarin.  ED Course: head CT negative for acute process.  INR 1.26.  Neurology to see the patient.  Hospitalist asked to place in observation.  Review of Systems: As per HPI otherwise 10 systems reviewed and negative.   Past Medical History:  Diagnosis Date  . Anemia   . Anxiety   . Arthritis    knees   . Dysrhythmia    heart skips a beat   . GERD (gastroesophageal reflux disease)   . Gout   . Hyperlipidemia April 2014  . Hypertension   . Shortness of breath dyspnea    due to pressure of cyst per patient     Past Surgical History:  Procedure Laterality Date  . CESAREAN SECTION     x3  . OVARIAN CYST SURGERY Right 1980's  . SALPINGOOPHORECTOMY Bilateral 02/06/2015   Procedure: [place] Stall LAPAROTOMY/BILATERAL SALPINGO OOPHORECTOMY;  Surgeon: Everitt Amber, MD;  Location: [facility];  Service: Gynecology;  Laterality: Bilateral;    SOCIAL HISTORY: She says that she  has stopped smoking.  She will have an occasional alcoholic beverage.  No illicit drug use.  She is divorced.  She has 3 adult children.  Allergies  Allergen Reactions  . Tramadol Other (See Comments)    Depression, moody, increased back pain    Family History  Problem Relation Age of Onset  . Cancer Mother   . Hypertension Mother   . Diabetes Mother   . Diabetes Sister   . Hypertension Sister      Prior to Admission medications   Medication Sig Start Date End Date Taking? Authorizing Provider  acetaminophen-codeine (TYLENOL #3) 300-30 MG tablet Take 1 tablet by mouth daily as needed (pain).  11/03/16  Yes Historical Provider, MD  amLODipine (NORVASC) 10 MG tablet Take 10 mg by mouth daily. 11/29/16  Yes Historical Provider, MD  busPIRone (BUSPAR) 7.5 MG tablet Take 1 tablet (7.5 mg total) by mouth 2 (two) times daily. Patient taking differently: Take 7.5 mg by mouth See admin instructions. Take 1 tablet (7.5 mg) by mouth twice daily, may also take 1 tablet (7.5 mg) during the day as needed for anxiety 10/22/16  Yes Daniel J Angiulli, PA-C  colchicine 0.6 MG tablet Take 1 tablet (0.6 mg total) by mouth 2 (two) times daily. Patient taking differently: Take 0.6 mg by mouth 2 (two) times daily. Colcrys 10/22/16  Yes Daniel J Angiulli, PA-C  diclofenac sodium (  VOLTAREN) 1 % GEL Apply 4 g topically 4 (four) times daily. To both knees, please instruct in dosing. Patient taking differently: Apply 1 application topically 2 (two) times daily as needed (knee pain).  10/22/16  Yes Daniel J Angiulli, PA-C  ferrous sulfate 325 (65 FE) MG tablet Take 1 tablet (325 mg total) by mouth 2 (two) times daily with a meal. 10/22/16  Yes Daniel J Angiulli, PA-C  fluticasone (FLONASE) 50 MCG/ACT nasal spray Place 2 sprays into both nostrils 2 (two) times daily.  04/17/14  Yes Audelia Hives Presson, PA  folic acid (FOLVITE) 1 MG tablet Take 1 tablet (1 mg total) by mouth daily. 10/22/16  Yes Daniel J Angiulli, PA-C    Multiple Vitamin (MULTIVITAMIN WITH MINERALS) TABS tablet Take 1 tablet by mouth daily. 10/22/16  Yes Daniel J Angiulli, PA-C  rosuvastatin (CRESTOR) 20 MG tablet Take 20 mg by mouth daily. 10/26/16  Yes Historical Provider, MD  Tetrahydroz-Dextran-PEG-Povid 0.05-0.1-1-1 % SOLN Place 1 drop into both eyes daily as needed (dry eyes). Generic OTC eye drops   Yes Historical Provider, MD  topiramate (TOPAMAX) 25 MG tablet Take 1 tablet (25 mg total) by mouth daily. 10/22/16  Yes Daniel J Angiulli, PA-C  warfarin (COUMADIN) 5 MG tablet Take 1 tablet (5 mg total) by mouth daily at 6 PM. Patient taking differently: Take 2.5 mg by mouth daily at 6 PM.  10/22/16  Yes Cathlyn Parsons, PA-C    Physical Exam: Vitals:   11/29/16 1901 11/29/16 1915 11/29/16 1930 11/29/16 2100  BP: 120/70 (!) 101/59 115/84 (!) 114/93  Pulse: 94 91 92 94  Resp: 19 (!) 26 (!) 21 16  Temp: 98.4 F (36.9 C)     TempSrc: Oral     SpO2: 97% 100% 99% 99%  Weight:      Height:          Constitutional: NAD, calm, comfortable, appears anxious Vitals:   11/29/16 1901 11/29/16 1915 11/29/16 1930 11/29/16 2100  BP: 120/70 (!) 101/59 115/84 (!) 114/93  Pulse: 94 91 92 94  Resp: 19 (!) 26 (!) 21 16  Temp: 98.4 F (36.9 C)     TempSrc: Oral     SpO2: 97% 100% 99% 99%  Weight:      Height:       Eyes: PERRL, right lid lag, conjunctivae normal ENMT: Mucous membranes are moist. Posterior pharynx clear of any exudate or lesions. Normal dentition.  Neck: normal appearance, supple, no masses Respiratory: clear to auscultation bilaterally, no wheezing, no crackles. Normal respiratory effort. No accessory muscle use.  Cardiovascular: Normal rate, regular rhythm, no murmurs / rubs / gallops. No extremity edema. 2+ pedal pulses. No carotid bruits.  GI: abdomen is soft and compressible.  No distention.  No tenderness.  No masses palpated.  Bowel sounds are present. Musculoskeletal:  No joint deformity in upper and lower extremities.  Good ROM, no contractures. Normal muscle tone.  Skin: no rashes, warm and dry Neurologic: Subtle right facial droop and right lid lag otherwise CN 2-12 grossly intact. Sensation intact, Strength symmetric bilaterally, 5/5. Psychiatric: Normal judgment and insight. Alert and oriented x 3. Normal mood.     Labs on Admission: I have personally reviewed following labs and imaging studies  CBC:  Recent Labs Lab 11/29/16 1918 11/29/16 1934  WBC 6.9  --   NEUTROABS 3.7  --   HGB 12.1 12.6  HCT 37.2 37.0  MCV 94.2  --   PLT 212  --  Basic Metabolic Panel:  Recent Labs Lab 11/29/16 1918 11/29/16 1934  NA 142 144  K 3.6 3.8  CL 111 107  CO2 25  --   GLUCOSE 97 97  BUN 19 23*  CREATININE 0.88 0.80  CALCIUM 10.4*  --    GFR: Estimated Creatinine Clearance (by C-G formula based on SCr of 0.8 mg/dL) Female: 58.1 mL/min Female: 68.6 mL/min Liver Function Tests:  Recent Labs Lab 11/29/16 1918  AST 29  ALT 36  ALKPHOS 86  BILITOT 0.4  PROT 7.2  ALBUMIN 3.9   Coagulation Profile:  Recent Labs Lab 11/29/16 1918  INR 1.26   Urine analysis: Pending  Radiological Exams on Admission: Ct Head Wo Contrast  Result Date: 11/29/2016 CLINICAL DATA:  Stroke-like symptoms since slurred speech, initial encounter EXAM: CT HEAD WITHOUT CONTRAST TECHNIQUE: Contiguous axial images were obtained from the base of the skull through the vertex without intravenous contrast. COMPARISON:  10/11/2016 FINDINGS: Brain: Some focal decreased attenuation is noted in the pons on the right similar to that seen on the prior MRI examination. No findings to suggest acute hemorrhage, acute infarction or space-occupying mass lesion are noted. Vascular: No hyperdense vessel or unexpected calcification. Skull: Normal. Negative for fracture or focal lesion. Sinuses/Orbits: No acute finding. Other: None. IMPRESSION: Prior right pontine infarct similar to that seen on prior MRI examination. No acute abnormality  noted. Electronically Signed   By: Inez Catalina M.D.   On: 11/29/2016 20:37    EKG: Independently reviewed by me.  NSR.  PVC.  Similar to prior.  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  Anticoagulated with warfarin; INR level subtherapeutic tonight. --Neurology input appreciated. --MRI/MRA to rule out new event --Heparin infusion while INR is subtherapeutic; may need to discharge with a lovenox bridge. --Warfarin per pharmacy --Continue statin  Anxiety --Buspar  HTN --Norvasc  Chronic headache --Topamax  Gout --Colchicine   DVT prophylaxis: FULL anticoagulation, heparin infusion plus warfarin Code Status: FULL Family Communication: Patient alone in the ED at time of admission. Disposition Plan: Expect she will go home at discharge. Consults called: neurology Admission status: Place in observation with telemetry monitoring.   TIME SPENT: 50 minutes   Eber Jones MD [facility] Pager [phone]  If 7PM-7AM, please contact night-coverage [email or web address] Password [email or web address]  11/29/2016, 10:30 PM

## 2016-11-29 NOTE — ED Notes (Signed)
PT takes her own 7.5 mg buspar tablet per PT's request. She does not wish to wait for pharmacy to send tablet.

## 2016-11-29 NOTE — ED Notes (Signed)
Care handoff to [facility]. PT remains in MRI

## 2016-11-29 NOTE — Consult Note (Signed)
Admission H&P    Chief Complaint: Transient slurred speech.  HPI: Mercedes Gardner is an 71 y.o. 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 Patient has been on Coumadin for stroke prophylaxis. She presented to the ED today with history of transient slurring of speech which started at 5 PM today. Symptoms were similar to her presentation in January 2018 with an acute stroke. CT of her head showed no acute findings. There was no evidence of intracranial hemorrhage. INR was subtherapeutic at 1.2. Patient is being admitted for workup to rule out recurrent stroke as well as anticoagulation with IV heparin until INR is therapeutic with Coumadin.  LSN: 12:00 PM on 11/29/2016 tPA Given: No: Deficit resolved mRankin:  Past Medical History:  Diagnosis Date  . Anemia   . Anxiety   . Arthritis    knees   . Dysrhythmia    heart skips a beat   . GERD (gastroesophageal reflux disease)   . Gout   . Hyperlipidemia April 2014  . Hypertension   . Shortness of breath dyspnea    due to pressure of cyst per patient     Past Surgical History:  Procedure Laterality Date  . CESAREAN SECTION     x3  . OVARIAN CYST SURGERY Right 1980's  . SALPINGOOPHORECTOMY Bilateral 02/06/2015   Procedure: Benay Pillow LAPAROTOMY/BILATERAL SALPINGO OOPHORECTOMY;  Surgeon: Adolphus Birchwood, MD;  Location: [facility];  Service: Gynecology;  Laterality: Bilateral;    Family History  Problem Relation Age of Onset  . Cancer Mother   . Hypertension Mother   . Diabetes Mother   . Diabetes Sister   . Hypertension Sister    Social History:  reports that she has been smoking Cigarettes.  She has been smoking about 0.02 packs per day. She has never used smokeless tobacco. She reports that she does not drink alcohol or use drugs.  Allergies:  Allergies  Allergen Reactions  . Tramadol Other (See Comments)    Per pt, she got  depressed, moody, and had increased back pain when she took tramadol    Medications: Preadmission medications were reviewed by me.  ROS: History obtained from the patient  General ROS: negative for - chills, fatigue, fever, night sweats, weight gain or weight loss Psychological ROS: negative for - behavioral disorder, hallucinations, memory difficulties, mood swings or suicidal ideation Ophthalmic ROS: negative for - blurry vision, double vision, eye pain or loss of vision ENT ROS: negative for - epistaxis, nasal discharge, oral lesions, sore throat, tinnitus or vertigo Allergy and Immunology ROS: negative for - hives or itchy/watery eyes Hematological and Lymphatic ROS: negative for - bleeding problems, bruising or swollen lymph nodes Endocrine ROS: negative for - galactorrhea, hair pattern changes, polydipsia/polyuria or temperature intolerance Respiratory ROS: negative for - cough, hemoptysis, shortness of breath or wheezing Cardiovascular ROS: negative for - chest pain, dyspnea on exertion, edema or irregular heartbeat Gastrointestinal ROS: negative for - abdominal pain, diarrhea, hematemesis, nausea/vomiting or stool incontinence Genito-Urinary ROS: negative for - dysuria, hematuria, incontinence or urinary frequency/urgency Musculoskeletal ROS: negative for - joint swelling or muscular weakness Neurological ROS: as noted in HPI Dermatological ROS: negative for rash and skin lesion changes  Physical Examination: Blood pressure (!) 114/93, pulse 94, temperature 98.4 F (36.9 C), temperature source Oral, resp. rate 16, height 5\' 4"  (1.626 m), weight 54.9 kg (121 lb), SpO2 99 %.  HEENT-  Normocephalic, no lesions, without obvious  abnormality.  Normal external eye and conjunctiva.  Normal TM's bilaterally.  Normal auditory canals and external ears. Normal external nose, mucus membranes and septum.  Normal pharynx. Neck supple with no masses, nodes, nodules or  enlargement. Cardiovascular - regular rate and rhythm, S1, S2 normal, no murmur, click, rub or gallop Lungs - chest clear, no wheezing, rales, normal symmetric air entry Abdomen - soft, non-tender; bowel sounds normal; no masses,  no organomegaly Extremities - no joint deformities, effusion, or inflammation and no edema  Neurologic Examination: Mental Status: Alert, oriented, thought content appropriate.  Speech fluent without evidence of aphasia. Able to follow commands without difficulty. Cranial Nerves: II-Visual fields were normal. III/IV/VI-Pupils were equal and reacted normally to light. Extraocular movements were full and conjugate.    V/VII-no facial numbness and no facial weakness. VIII-normal. X-normal speech and symmetrical palatal movement. XI: trapezius strength/neck flexion strength normal bilaterally XII-midline tongue extension with normal strength. Motor: 5/5 bilaterally with normal tone and bulk Sensory: Normal throughout. Deep Tendon Reflexes: 1+ and symmetric. Plantars: Flexor bilaterally Cerebellar: Normal finger-to-nose testing. Carotid auscultation: Normal  Results for orders placed or performed during the hospital encounter of 11/29/16 (from the past 48 hour(s))  Ethanol     Status: None   Collection Time: 11/29/16  7:18 PM  Result Value Ref Range   Alcohol, Ethyl (B) <5 <5 mg/dL    Comment:        LOWEST DETECTABLE LIMIT FOR SERUM ALCOHOL IS 5 mg/dL FOR MEDICAL PURPOSES ONLY   Protime-INR     Status: Abnormal   Collection Time: 11/29/16  7:18 PM  Result Value Ref Range   Prothrombin Time 15.9 (H) 11.4 - 15.2 seconds   INR 1.26   APTT     Status: None   Collection Time: 11/29/16  7:18 PM  Result Value Ref Range   aPTT 28 24 - 36 seconds  CBC     Status: None   Collection Time: 11/29/16  7:18 PM  Result Value Ref Range   WBC 6.9 4.0 - 10.5 K/uL   RBC 3.95 3.87 - 5.11 MIL/uL   Hemoglobin 12.1 12.0 - 15.0 g/dL   HCT 37.2 36.0 - 46.0 %   MCV 94.2  78.0 - 100.0 fL   MCH 30.6 26.0 - 34.0 pg   MCHC 32.5 30.0 - 36.0 g/dL   RDW 13.1 11.5 - 15.5 %   Platelets 212 150 - 400 K/uL  Differential     Status: None   Collection Time: 11/29/16  7:18 PM  Result Value Ref Range   Neutrophils Relative % 54 %   Neutro Abs 3.7 1.7 - 7.7 K/uL   Lymphocytes Relative 38 %   Lymphs Abs 2.7 0.7 - 4.0 K/uL   Monocytes Relative 5 %   Monocytes Absolute 0.4 0.1 - 1.0 K/uL   Eosinophils Relative 3 %   Eosinophils Absolute 0.2 0.0 - 0.7 K/uL   Basophils Relative 0 %   Basophils Absolute 0.0 0.0 - 0.1 K/uL  Comprehensive metabolic panel     Status: Abnormal   Collection Time: 11/29/16  7:18 PM  Result Value Ref Range   Sodium 142 135 - 145 mmol/L   Potassium 3.6 3.5 - 5.1 mmol/L   Chloride 111 101 - 111 mmol/L   CO2 25 22 - 32 mmol/L   Glucose, Bld 97 65 - 99 mg/dL   BUN 19 6 - 20 mg/dL   Creatinine, Ser 0.88 0.44 - 1.00 mg/dL   Calcium 10.4 (  H) 8.9 - 10.3 mg/dL   Total Protein 7.2 6.5 - 8.1 g/dL   Albumin 3.9 3.5 - 5.0 g/dL   AST 29 15 - 41 U/L   ALT 36 14 - 54 U/L   Alkaline Phosphatase 86 38 - 126 U/L   Total Bilirubin 0.4 0.3 - 1.2 mg/dL   GFR calc non Af Amer >60 >60 mL/min   GFR calc Af Amer >60 >60 mL/min    Comment: (NOTE) The eGFR has been calculated using the CKD EPI equation. This calculation has not been validated in all clinical situations. eGFR's persistently <60 mL/min signify possible Chronic Kidney Disease.    Anion gap 6 5 - 15  I-stat troponin, ED (not at [facility], [facility])     Status: None   Collection Time: 11/29/16  7:33 PM  Result Value Ref Range   Troponin i, poc 0.00 0.00 - 0.08 ng/mL   Comment 3            Comment: Due to the release kinetics of cTnI, a negative result within the first hours of the onset of symptoms does not rule out myocardial infarction with certainty. If myocardial infarction is still suspected, repeat the test at appropriate intervals.   I-Stat Chem 8, ED  (not at [facility])     Status: Abnormal    Collection Time: 11/29/16  7:34 PM  Result Value Ref Range   Sodium 144 135 - 145 mmol/L   Potassium 3.8 3.5 - 5.1 mmol/L   Chloride 107 101 - 111 mmol/L   BUN 23 (H) 6 - 20 mg/dL   Creatinine, Ser 0.80 0.44 - 1.00 mg/dL   Glucose, Bld 97 65 - 99 mg/dL   Calcium, Ion 1.41 (H) 1.15 - 1.40 mmol/L   TCO2 26 0 - 100 mmol/L   Hemoglobin 12.6 12.0 - 15.0 g/dL   HCT 37.0 36.0 - 46.0 %   Ct Head Wo Contrast  Result Date: 11/29/2016 CLINICAL DATA:  Stroke-like symptoms since slurred speech, initial encounter EXAM: CT HEAD WITHOUT CONTRAST TECHNIQUE: Contiguous axial images were obtained from the base of the skull through the vertex without intravenous contrast. COMPARISON:  10/11/2016 FINDINGS: Brain: Some focal decreased attenuation is noted in the pons on the right similar to that seen on the prior MRI examination. No findings to suggest acute hemorrhage, acute infarction or space-occupying mass lesion are noted. Vascular: No hyperdense vessel or unexpected calcification. Skull: Normal. Negative for fracture or focal lesion. Sinuses/Orbits: No acute finding. Other: None. IMPRESSION: Prior right pontine infarct similar to that seen on prior MRI examination. No acute abnormality noted. Electronically Signed   By: Inez Catalina M.D.   On: 11/29/2016 20:37    Assessment: 71 y.o. female with multiple risk factors for stroke as well as history of stroke and basal artery thrombosis on anticoagulation with subtherapeutic INR, presenting with TIA or possibly a recurrent stroke  Stroke Risk Factors - hyperlipidemia, hypertension and basilar artery thrombosis  Plan: 1. HgbA1c, fasting lipid panel 2. MRI, MRA  of the brain without contrast 3. Prophylactic therapy-Anticoagulation: Coumadin, with IV heparin bridge 4. Risk factor modification 5. Telemetry monitoring  C.R. [name], MD [facility] [phone]  11/29/2016, 9:43 PM

## 2016-11-29 NOTE — ED Notes (Signed)
Patient transported to MRI 

## 2016-11-29 NOTE — ED Provider Notes (Signed)
[facility] DEPT Provider Note   CSN: [id number] Arrival date & time: 11/29/16  1855     History   Chief Complaint Chief Complaint  Patient presents with  . Transient Ischemic Attack    HPI Mercedes Gardner is a 71 y.o. female.  70 yo F with a chief complaint of slurred speech. This been going off and on today. Patient had 2 episodes of this. The latest one lasted about an hour. Patient denied any other areas of focal weakness or numbness. Denied difficulty with word finding. Denies recent head injury. Patient did just have a change of her dosing of her home Coumadin. She says she is now taking half a pill onset of flow. This been about a week ago.   The history is provided by the patient.  Illness  This is a new problem. The current episode started 1 to 2 hours ago. The problem occurs rarely. The problem has been resolved. Pertinent negatives include no chest pain, no headaches and no shortness of breath. Nothing aggravates the symptoms. Nothing relieves the symptoms. She has tried nothing for the symptoms. The treatment provided no relief.    Past Medical History:  Diagnosis Date  . Anemia   . Anxiety   . Arthritis    knees   . Dysrhythmia    heart skips a beat   . GERD (gastroesophageal reflux disease)   . Gout   . Hyperlipidemia April 2014  . Hypertension   . Shortness of breath dyspnea    due to pressure of cyst per patient     Patient Active Problem List   Diagnosis Date Noted  . Hemiparesis affecting left side as late effect of stroke ([place]) 10/14/2016  . Gait disturbance, post-stroke 10/14/2016  . Dysarthria   . Left-sided weakness   . Lung nodule   . Unintentional weight loss   . Cerebral infarction due to thrombosis of basilar artery ([place])   . Stroke-like symptoms 10/11/2016  . FTT (failure to thrive) in adult 10/11/2016  . Leukocytosis 10/11/2016  . Acute hypokalemia 10/11/2016  . Normocytic anemia 10/11/2016  . Cerebral thrombosis with cerebral  infarction 10/11/2016  . Acute CVA (cerebrovascular accident) ([place]) 10/11/2016  . 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 Ischemic stroke ([place])   . Anxiety   . Hypokalemia   . Pelvic mass in female 12/26/2014  . Gout 12/24/2008  . ALLERGIC RHINITIS 12/20/2008  . KNEE PAIN, LEFT 12/20/2008  . TOBACCO ABUSE 07/29/2006  . HTN (hypertension) 07/29/2006  . HEMORRHOIDS 07/29/2006    Past Surgical History:  Procedure Laterality Date  . CESAREAN SECTION     x3  . OVARIAN CYST SURGERY Right 1980's  . SALPINGOOPHORECTOMY Bilateral 02/06/2015   Procedure: Campbell Stall LAPAROTOMY/BILATERAL SALPINGO OOPHORECTOMY;  Surgeon: Everitt Amber, MD;  Location: [facility];  Service: Gynecology;  Laterality: Bilateral;    OB History    Gravida Para Term Preterm AB Living   4 3 3   1      SAB TAB Ectopic Multiple Live Births                   Home Medications    Prior to Admission medications   Medication Sig Start Date End Date Taking? Authorizing Provider  atorvastatin (LIPITOR) 80 MG tablet Take 1 tablet (80 mg total) by mouth daily at 6 PM. 10/22/16   Lavon Paganini Angiulli, PA-C  busPIRone (BUSPAR) 7.5 MG tablet Take 1 tablet (7.5 mg total) by mouth  2 (two) times daily. 10/22/16   Lavon Paganini Angiulli, PA-C  colchicine 0.6 MG tablet Take 1 tablet (0.6 mg total) by mouth 2 (two) times daily. 10/22/16   Lavon Paganini Angiulli, PA-C  diclofenac sodium (VOLTAREN) 1 % GEL Apply 4 g topically 4 (four) times daily. To both knees, please instruct in dosing. 10/22/16   Lavon Paganini Angiulli, PA-C  ferrous sulfate 325 (65 FE) MG tablet Take 1 tablet (325 mg total) by mouth 2 (two) times daily with a meal. 10/22/16   Lavon Paganini Angiulli, PA-C  fexofenadine-pseudoephedrine (ALLEGRA-D) 60-120 MG per tablet Take 1 tablet by mouth 2 (two) times daily as needed (ALLERGIES).    Historical Provider, MD  fluticasone (FLONASE) 50 MCG/ACT nasal spray Place 2 sprays into both nostrils 2 (two) times daily.  04/17/14   Audelia Hives  Presson, PA  folic acid (FOLVITE) 1 MG tablet Take 1 tablet (1 mg total) by mouth daily. 10/22/16   Lavon Paganini Angiulli, PA-C  ipratropium (ATROVENT) 0.06 % nasal spray Place 2 sprays into both nostrils 4 (four) times daily. 06/12/15   Billy Fischer, MD  Multiple Vitamin (MULTIVITAMIN WITH MINERALS) TABS tablet Take 1 tablet by mouth daily. 10/22/16   Lavon Paganini Angiulli, PA-C  nicotine (NICODERM CQ - DOSED IN MG/24 HOURS) 21 mg/24hr patch 21 mg patch daily 1 week then 14 mg patch daily 3 weeks then 7 mg patch daily 3 weeks and stop 10/22/16   Lavon Paganini Angiulli, PA-C  omeprazole (PRILOSEC) 20 MG capsule Take 1 capsule (20 mg total) by mouth every morning. 10/22/16   Lavon Paganini Angiulli, PA-C  senna-docusate (SENOKOT-S) 8.6-50 MG tablet Take 1 tablet by mouth at bedtime as needed for mild constipation. 10/14/16   Donne Hazel, MD  topiramate (TOPAMAX) 25 MG tablet Take 1 tablet (25 mg total) by mouth daily. 10/22/16   Lavon Paganini Angiulli, PA-C  warfarin (COUMADIN) 5 MG tablet Take 1 tablet (5 mg total) by mouth daily at 6 PM. 10/22/16   Cathlyn Parsons, PA-C    Family History Family History  Problem Relation Age of Onset  . Cancer Mother   . Hypertension Mother   . Diabetes Mother   . Diabetes Sister   . Hypertension Sister     Social History Social History  Substance Use Topics  . Smoking status: Current Some Day Smoker    Packs/day: 0.02    Types: Cigarettes  . Smokeless tobacco: Never Used     Comment: 2 cigarettes per pday on 01/31/15    . Alcohol use No     Allergies   Tramadol   Review of Systems Review of Systems  Constitutional: Negative for chills and fever.  HENT: Negative for congestion and rhinorrhea.   Eyes: Negative for redness and visual disturbance.  Respiratory: Negative for shortness of breath and wheezing.   Cardiovascular: Negative for chest pain and palpitations.  Gastrointestinal: Negative for nausea and vomiting.  Genitourinary: Negative for dysuria and urgency.    Musculoskeletal: Negative for arthralgias and myalgias.  Skin: Negative for pallor and wound.  Neurological: Positive for speech difficulty. Negative for dizziness and headaches.     Physical Exam Updated Vital Signs BP (!) 114/93   Pulse 94   Temp 98.4 F (36.9 C) (Oral)   Resp 16   Ht 5\' 4"  (1.626 m)   Wt 121 lb (54.9 kg)   SpO2 99%   BMI 20.77 kg/m   Physical Exam  Constitutional: She is oriented to  person, place, and time. She appears well-developed and well-nourished. No distress.  HENT:  Head: Normocephalic and atraumatic.  Eyes: EOM are normal. Pupils are equal, round, and reactive to light.  Neck: Normal range of motion. Neck supple.  Cardiovascular: Normal rate and regular rhythm.  Exam reveals no gallop and no friction rub.   No murmur heard. Pulmonary/Chest: Effort normal. She has no wheezes. She has no rales.  Abdominal: Soft. She exhibits no distension and no mass. There is no tenderness. There is no guarding.  Musculoskeletal: She exhibits no edema or tenderness.  Neurological: She is alert and oriented to person, place, and time. She has normal strength. No cranial nerve deficit or sensory deficit. She displays a negative Romberg sign. Coordination and gait normal. GCS eye subscore is 4. GCS verbal subscore is 5. GCS motor subscore is 6. She displays no Babinski's sign on the right side. She displays no Babinski's sign on the left side.  Skin: Skin is warm and dry. She is not diaphoretic.  Psychiatric: She has a normal mood and affect. Her behavior is normal.  Nursing note and vitals reviewed.    ED Treatments / Results  Labs (all labs ordered are listed, but only abnormal results are displayed) Labs Reviewed  PROTIME-INR - Abnormal; Notable for the following:       Result Value   Prothrombin Time 15.9 (*)    All other components within normal limits  COMPREHENSIVE METABOLIC PANEL - Abnormal; Notable for the following:    Calcium 10.4 (*)    All other  components within normal limits  I-STAT CHEM 8, ED - Abnormal; Notable for the following:    BUN 23 (*)    Calcium, Ion 1.41 (*)    All other components within normal limits  ETHANOL  APTT  CBC  DIFFERENTIAL  RAPID URINE DRUG SCREEN, HOSP PERFORMED  URINALYSIS, ROUTINE W REFLEX MICROSCOPIC  I-STAT TROPOININ, ED    EKG  EKG Interpretation  Date/Time:  Monday November 29 2016 19:02:57 EDT Ventricular Rate:  94 PR Interval:    QRS Duration: 87 QT Interval:  335 QTC Calculation: 419 R Axis:   55 Text Interpretation:  Sinus rhythm Ventricular premature complex Borderline short PR interval Probable left atrial enlargement Abnormal R-wave progression, early transition Probable left ventricular hypertrophy No significant change since last tracing Confirmed by Johannah Rozas MD, Quillian Quince ([id number]) on 11/29/2016 7:39:24 PM       Radiology Ct Head Wo Contrast  Result Date: 11/29/2016 CLINICAL DATA:  Stroke-like symptoms since slurred speech, initial encounter EXAM: CT HEAD WITHOUT CONTRAST TECHNIQUE: Contiguous axial images were obtained from the base of the skull through the vertex without intravenous contrast. COMPARISON:  10/11/2016 FINDINGS: Brain: Some focal decreased attenuation is noted in the pons on the right similar to that seen on the prior MRI examination. No findings to suggest acute hemorrhage, acute infarction or space-occupying mass lesion are noted. Vascular: No hyperdense vessel or unexpected calcification. Skull: Normal. Negative for fracture or focal lesion. Sinuses/Orbits: No acute finding. Other: None. IMPRESSION: Prior right pontine infarct similar to that seen on prior MRI examination. No acute abnormality noted. Electronically Signed   By: Inez Catalina M.D.   On: 11/29/2016 20:37    Procedures Procedures (including critical care time)  Medications Ordered in ED Medications - No data to display   Initial Impression / Assessment and Plan / ED Course  I have reviewed the triage  vital signs and the nursing notes.  Pertinent labs & imaging  results that were available during my care of the patient were reviewed by me and considered in my medical decision making (see chart for details).     71 yo F With a chief complaint of slurred speech. This resolved spontaneously prior to arrival. Patient has accommodated history of a prior stroke just about a month and a half ago where she had very similar symptoms to this. The symptoms would come and go at that time as well. She was found to have an acute stroke and spent some time in the hospital and is gone to rehabilitation.  I discussed the case with Dr. [name], he is concerned because this is the patient's initial presentation as well as she is subtherapeutic on her Coumadin. He feels that she is symptomatic she likely needs to be bridged on heparin until her Coumadin has been therapeutic. Recommend a repeat MRI as well.  The patients results and plan were reviewed and discussed.   Any x-rays performed were independently reviewed by myself.   Differential diagnosis were considered with the presenting HPI.  Medications - No data to display  Vitals:   11/29/16 1901 11/29/16 1915 11/29/16 1930 11/29/16 2100  BP: 120/70 (!) 101/59 115/84 (!) 114/93  Pulse: 94 91 92 94  Resp: 19 (!) 26 (!) 21 16  Temp: 98.4 F (36.9 C)     TempSrc: Oral     SpO2: 97% 100% 99% 99%  Weight:      Height:        Final diagnoses:  Stroke-like symptoms  Slurred speech    Admission/ observation were discussed with the admitting physician, patient and/or family and they are comfortable with the plan.    Final Clinical Impressions(s) / ED Diagnoses   Final diagnoses:  Stroke-like symptoms  Slurred speech    New Prescriptions New Prescriptions   No medications on file     Deno Etienne, DO 11/29/16 2306

## 2016-11-29 NOTE — ED Notes (Signed)
Asked pt for urine sample. Pt stated that she had already went. Pt stated that she will try again soon.

## 2016-11-29 NOTE — ED Notes (Signed)
Pt. currently at MRI .  

## 2016-11-29 NOTE — Progress Notes (Signed)
ANTICOAGULATION CONSULT NOTE - Initial Consult  Pharmacy Consult for Heparin bridge for warfarin Indication: stroke  Allergies  Allergen Reactions  . Tramadol Other (See Comments)    Per pt, she got depressed, moody, and had increased back pain when she took tramadol    Patient Measurements: Height: 5\' 4"  (162.6 cm) Weight: 121 lb (54.9 kg) IBW/kg (Calculated) : 54.7 Heparin Dosing Weight: 55kg  Vital Signs: Temp: 98.4 F (36.9 C) (03/19 1901) Temp Source: Oral (03/19 1901) BP: 115/84 (03/19 1930) Pulse Rate: 92 (03/19 1930)  Labs:  Recent Labs  11/29/16 1918 11/29/16 1934  HGB 12.1 12.6  HCT 37.2 37.0  PLT 212  --   APTT 28  --   LABPROT 15.9*  --   INR 1.26  --   CREATININE 0.88 0.80    Estimated Creatinine Clearance (by C-G formula based on SCr of 0.8 mg/dL) Female: 58.1 mL/min Female: 68.6 mL/min   Medical History: Past Medical History:  Diagnosis Date  . Anemia   . Anxiety   . Arthritis    knees   . Dysrhythmia    heart skips a beat   . GERD (gastroesophageal reflux disease)   . Gout   . Hyperlipidemia April 2014  . Hypertension   . Shortness of breath dyspnea    due to pressure of cyst per patient     Medications:   (Not in a hospital admission) Scheduled:  Infusions:  PRN:   Assessment: Patient is a 28 yof admitted 3/19 w/ TIA. She was on warfarin PTA for recent stroke, but INR was subtherapeutic on admission at 1.26. She reportedly took her warfarin dose tonight, 3/19. Baseline CBC is within normal limits.   PTA warfarin dose: 2.5mg  PO daily    Will given an additional dose tonight as boost.  Goal of Therapy:  INR 2-3  HL 0.3-0.5 Monitor platelets by anticoagulation protocol: Yes   Plan:  Heparin 750 units/hr IV, no bolus Warfarin 2mg  PO x1 (in addition to 2.5mg  taken PTA) 6-hr HL Daily INR, HL, and CBC until INR therapeutic   Demetrius Charity, PharmD Acute Care Pharmacy Resident  Pager: [phone] 11/29/2016

## 2016-11-30 ENCOUNTER — Observation Stay (HOSPITAL_COMMUNITY): Payer: Medicare Other

## 2016-11-30 ENCOUNTER — Encounter: Payer: Medicare Other | Admitting: *Deleted

## 2016-11-30 ENCOUNTER — Ambulatory Visit: Payer: Medicare Other | Admitting: Physical Therapy

## 2016-11-30 ENCOUNTER — Ambulatory Visit: Payer: Medicare Other | Admitting: Occupational Therapy

## 2016-11-30 ENCOUNTER — Encounter (HOSPITAL_COMMUNITY): Payer: Self-pay | Admitting: Radiology

## 2016-11-30 DIAGNOSIS — E785 Hyperlipidemia, unspecified: Secondary | ICD-10-CM | POA: Diagnosis present

## 2016-11-30 DIAGNOSIS — R402142 Coma scale, eyes open, spontaneous, at arrival to emergency department: Secondary | ICD-10-CM | POA: Diagnosis present

## 2016-11-30 DIAGNOSIS — R402362 Coma scale, best motor response, obeys commands, at arrival to emergency department: Secondary | ICD-10-CM | POA: Diagnosis present

## 2016-11-30 DIAGNOSIS — K219 Gastro-esophageal reflux disease without esophagitis: Secondary | ICD-10-CM | POA: Diagnosis present

## 2016-11-30 DIAGNOSIS — M170 Bilateral primary osteoarthritis of knee: Secondary | ICD-10-CM | POA: Diagnosis present

## 2016-11-30 DIAGNOSIS — I651 Occlusion and stenosis of basilar artery: Principal | ICD-10-CM

## 2016-11-30 DIAGNOSIS — Z7901 Long term (current) use of anticoagulants: Secondary | ICD-10-CM | POA: Diagnosis not present

## 2016-11-30 DIAGNOSIS — R2990 Unspecified symptoms and signs involving the nervous system: Secondary | ICD-10-CM | POA: Diagnosis not present

## 2016-11-30 DIAGNOSIS — Z833 Family history of diabetes mellitus: Secondary | ICD-10-CM | POA: Diagnosis not present

## 2016-11-30 DIAGNOSIS — I10 Essential (primary) hypertension: Secondary | ICD-10-CM | POA: Diagnosis present

## 2016-11-30 DIAGNOSIS — R402252 Coma scale, best verbal response, oriented, at arrival to emergency department: Secondary | ICD-10-CM | POA: Diagnosis present

## 2016-11-30 DIAGNOSIS — M109 Gout, unspecified: Secondary | ICD-10-CM | POA: Diagnosis present

## 2016-11-30 DIAGNOSIS — Z8673 Personal history of transient ischemic attack (TIA), and cerebral infarction without residual deficits: Secondary | ICD-10-CM | POA: Diagnosis not present

## 2016-11-30 DIAGNOSIS — Z8249 Family history of ischemic heart disease and other diseases of the circulatory system: Secondary | ICD-10-CM | POA: Diagnosis not present

## 2016-11-30 DIAGNOSIS — E43 Unspecified severe protein-calorie malnutrition: Secondary | ICD-10-CM | POA: Diagnosis not present

## 2016-11-30 DIAGNOSIS — Z7951 Long term (current) use of inhaled steroids: Secondary | ICD-10-CM | POA: Diagnosis not present

## 2016-11-30 DIAGNOSIS — Z888 Allergy status to other drugs, medicaments and biological substances status: Secondary | ICD-10-CM | POA: Diagnosis not present

## 2016-11-30 DIAGNOSIS — Z809 Family history of malignant neoplasm, unspecified: Secondary | ICD-10-CM | POA: Diagnosis not present

## 2016-11-30 DIAGNOSIS — F419 Anxiety disorder, unspecified: Secondary | ICD-10-CM | POA: Diagnosis present

## 2016-11-30 DIAGNOSIS — R4781 Slurred speech: Secondary | ICD-10-CM | POA: Diagnosis present

## 2016-11-30 DIAGNOSIS — F17210 Nicotine dependence, cigarettes, uncomplicated: Secondary | ICD-10-CM | POA: Diagnosis present

## 2016-11-30 HISTORY — PX: IR GENERIC HISTORICAL: IMG1180011

## 2016-11-30 LAB — URINALYSIS, ROUTINE W REFLEX MICROSCOPIC
BILIRUBIN URINE: NEGATIVE
Glucose, UA: NEGATIVE mg/dL
HGB URINE DIPSTICK: NEGATIVE
KETONES UR: NEGATIVE mg/dL
Nitrite: NEGATIVE
PH: 7 (ref 5.0–8.0)
Protein, ur: NEGATIVE mg/dL
SPECIFIC GRAVITY, URINE: 1.005 (ref 1.005–1.030)

## 2016-11-30 LAB — CBC
HEMATOCRIT: 36 % (ref 36.0–46.0)
HEMOGLOBIN: 11.4 g/dL — AB (ref 12.0–15.0)
MCH: 29.6 pg (ref 26.0–34.0)
MCHC: 31.7 g/dL (ref 30.0–36.0)
MCV: 93.5 fL (ref 78.0–100.0)
Platelets: 210 10*3/uL (ref 150–400)
RBC: 3.85 MIL/uL — AB (ref 3.87–5.11)
RDW: 12.9 % (ref 11.5–15.5)
WBC: 7.5 10*3/uL (ref 4.0–10.5)

## 2016-11-30 LAB — HEPARIN LEVEL (UNFRACTIONATED): Heparin Unfractionated: 0.23 IU/mL — ABNORMAL LOW (ref 0.30–0.70)

## 2016-11-30 LAB — PROTIME-INR
INR: 1.33
PROTHROMBIN TIME: 16.6 s — AB (ref 11.4–15.2)

## 2016-11-30 LAB — RAPID URINE DRUG SCREEN, HOSP PERFORMED
AMPHETAMINES: NOT DETECTED
Barbiturates: NOT DETECTED
Benzodiazepines: NOT DETECTED
Cocaine: NOT DETECTED
Opiates: NOT DETECTED
Tetrahydrocannabinol: NOT DETECTED

## 2016-11-30 MED ORDER — NITROGLYCERIN 1 MG/10 ML FOR IR/CATH LAB
INTRA_ARTERIAL | Status: AC
  Filled 2016-11-30: qty 10

## 2016-11-30 MED ORDER — IOPAMIDOL (ISOVUE-300) INJECTION 61%
INTRAVENOUS | Status: AC
  Administered 2016-11-30: 70 mL
  Filled 2016-11-30: qty 150

## 2016-11-30 MED ORDER — HEPARIN SOD (PORK) LOCK FLUSH 100 UNIT/ML IV SOLN
INTRAVENOUS | Status: AC
Start: 2016-11-30 — End: 2016-11-30
  Filled 2016-11-30: qty 10

## 2016-11-30 MED ORDER — MIDAZOLAM HCL 2 MG/2ML IJ SOLN
INTRAMUSCULAR | Status: AC
  Filled 2016-11-30: qty 2

## 2016-11-30 MED ORDER — ENSURE ENLIVE PO LIQD
237.0000 mL | Freq: Three times a day (TID) | ORAL | Status: DC
  Administered 2016-11-30 – 2016-12-01 (×2): 237 mL via ORAL

## 2016-11-30 MED ORDER — BUSPIRONE HCL 5 MG PO TABS
7.5000 mg | ORAL_TABLET | ORAL | Status: AC
  Administered 2016-11-30: 7.5 mg via ORAL

## 2016-11-30 MED ORDER — FENTANYL CITRATE (PF) 100 MCG/2ML IJ SOLN
INTRAMUSCULAR | Status: AC
  Filled 2016-11-30: qty 2

## 2016-11-30 MED ORDER — IOPAMIDOL (ISOVUE-300) INJECTION 61%
INTRAVENOUS | Status: AC
  Filled 2016-11-30: qty 50

## 2016-11-30 MED ORDER — FENTANYL CITRATE (PF) 100 MCG/2ML IJ SOLN
INTRAMUSCULAR | Status: DC | PRN
  Administered 2016-11-30: 25 ug via INTRAVENOUS

## 2016-11-30 MED ORDER — LIDOCAINE HCL 1 % IJ SOLN
INTRAMUSCULAR | Status: AC
  Filled 2016-11-30: qty 20

## 2016-11-30 MED ORDER — HEPARIN (PORCINE) IN NACL 100-0.45 UNIT/ML-% IJ SOLN
900.0000 [IU]/h | INTRAMUSCULAR | Status: DC
  Administered 2016-11-30 – 2016-12-01 (×2): 900 [IU]/h via INTRAVENOUS
  Filled 2016-11-30: qty 250

## 2016-11-30 MED ORDER — SODIUM CHLORIDE 0.9 % IV SOLN: INTRAVENOUS | Status: AC

## 2016-11-30 MED ORDER — HEPARIN SODIUM (PORCINE) 1000 UNIT/ML IJ SOLN
INTRAMUSCULAR | Status: AC
  Administered 2016-11-30: 1000 [IU]
  Filled 2016-11-30: qty 1

## 2016-11-30 MED ORDER — LIDOCAINE HCL (PF) 1 % IJ SOLN
INTRAMUSCULAR | Status: DC | PRN
  Administered 2016-11-30: 10 mL

## 2016-11-30 MED ORDER — WARFARIN SODIUM 5 MG PO TABS
5.0000 mg | ORAL_TABLET | Freq: Once | ORAL | Status: AC
Start: 2016-11-30 — End: 2016-11-30
  Administered 2016-11-30: 5 mg via ORAL
  Filled 2016-11-30: qty 1

## 2016-11-30 NOTE — Progress Notes (Signed)
Called to patient's room by patient's daughter - per daughter, pt was "erasing text messages on her phone from her boyfriend who is no good for her and causes a lot of stress" and then all of sudden she started "slurring her words." Pt with garbled speech on arrival however quickly resolved (less than 30 secs of me being in the room) while I was present and patient back to baseline. VSS - other than garbled speech, neuro intact and at baseline from this am assessment. Pt laid flat and asked to relax. Spoke with Burnetta Sabin, stroke NP and Dr. Bonner Puna also on floor and made aware of above, also made aware transport here to take patient to IR for procedure. Instructed to leave patient laying flat and they will bolus her in IR. Pt en-route to IR and at her baseline.

## 2016-11-30 NOTE — Care Management Obs Status (Signed)
[facility] NOTIFICATION   Patient Details  Name: Mercedes Gardner MRN: [id number] Date of Birth: September 27, 1945   Medicare Observation Status Notification Given:  Yes    Bethena Roys, RN 11/30/2016, 3:52 PM

## 2016-11-30 NOTE — Progress Notes (Signed)
STROKE TEAM PROGRESS NOTE   SUBJECTIVE (INTERVAL HISTORY) Her granddaughter is at the bedside.  Patient refused the cerebral angio when radiology came to arrange this am, stated they scared her. Dr. Leonie Man addressed her concerns. She wants him to talk to her daughter. She is distracted with her fear and unable to make a decision Discussing dx with her. Recommend angio today and discharge tomorrow on lovenox bridge..   OBJECTIVE Temp:  [98.2 F (36.8 C)-98.6 F (37 C)] 98.6 F (37 C) (03/20 0800) Pulse Rate:  [62-96] 87 (03/20 0800) Cardiac Rhythm: Normal sinus rhythm (03/20 0803) Resp:  [14-26] 14 (03/20 0400) BP: (101-130)/(57-107) 112/57 (03/20 0800) SpO2:  [94 %-100 %] 100 % (03/20 0800) Weight:  [54.9 kg (121 lb)-56.3 kg (124 lb 1.6 oz)] 56.3 kg (124 lb 1.6 oz) (03/20 0440)  CBC:  Recent Labs Lab 11/29/16 1918 11/29/16 1934 11/30/16 0358  WBC 6.9  --  7.5  NEUTROABS 3.7  --   --   HGB 12.1 12.6 11.4*  HCT 37.2 37.0 36.0  MCV 94.2  --  93.5  PLT 212  --  536    Basic Metabolic Panel:  Recent Labs Lab 11/29/16 1918 11/29/16 1934  NA 142 144  K 3.6 3.8  CL 111 107  CO2 25  --   GLUCOSE 97 97  BUN 19 23*  CREATININE 0.88 0.80  CALCIUM 10.4*  --     Lipid Panel:    Component Value Date/Time   CHOL 190 10/12/2016 0547   TRIG 97 10/12/2016 0547   HDL 42 10/12/2016 0547   CHOLHDL 4.5 10/12/2016 0547   VLDL 19 10/12/2016 0547   LDLCALC 129 (H) 10/12/2016 0547   HgbA1c:  Lab Results  Component Value Date   HGBA1C 5.4 10/12/2016   Urine Drug Screen:    Component Value Date/Time   LABOPIA NONE DETECTED 11/30/2016 0402   COCAINSCRNUR NONE DETECTED 11/30/2016 0402   LABBENZ NONE DETECTED 11/30/2016 0402   AMPHETMU NONE DETECTED 11/30/2016 0402   THCU NONE DETECTED 11/30/2016 0402   LABBARB NONE DETECTED 11/30/2016 0402     PHYSICAL EXAM Frail middle aged african american lady not in distress.  . Afebrile. Head is nontraumatic. Neck is supple without  bruit.    Cardiac exam no murmur or gallop. Lungs are clear to auscultation. Distal pulses are well felt. Neurological Exam ;  Awake  Alert oriented x 3. Normal speech and language.eye movements full without nystagmus.fundi were not visualized. Vision acuity and fields appear normal. Hearing is normal. Palatal movements are normal. Face symmetric. Tongue midline. Normal strength, tone, reflexes and coordination. Normal sensation. Gait deferred.  ASSESSMENT/PLAN Ms. ELOISE PICONE is a 71 y.o. 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 She did not receive IV t-PA due to deficits resolved.   BA thrombosis  Resultant  Neuro deficits resolved  CT head old R pontine infarct   MRI  No acute stroke. Normal evolution R pontine infarct  MRA  Severe stenosis BA, unchanged  Carotid Doppler  1/30 No significant stenosis   LE doppler 1/30 no DVT  2D Echo  1/30 EF 60-65%, no SOE  Cerebral angiogram ordered by Dr. Leonie Man, initially refused by pt now agreeable. Dr. Leonie Man discussed with Deveshwar. Will perform today.   LDL 129 in Jan  HgbA1c 5.4 in Jan  IV heparin/coumadin for VTE prophylaxis  Diet NPO time specified  warfarin daily  prior to admission with subtherapeutic INR 1.2 on admission, started on IV heparin as bridge to  warfarin daily  Essentially failed medical therapy - recommend further imaging (cerebral angio) to better look at [facility]  Therapy recommendations:  pending   Disposition:  pending   Hypertension  Stable  Permissive hypertension (OK if < 220/120) but gradually normalize in 5-7 days  Long-term BP goal normotensive  Hyperlipidemia  Home meds:  lipitor 80 mg daily  Changed to Crestor 20  LDL 129 in Jan  Continue statin at discharge  Other Stroke Risk Factors  Advanced age  Cigarette smoker, says she stopped smoking since last admission, started on nicotine  patch last admission   UDS negative  Hx stroke/TIA  10/2016 B pontine, R cerebellar, R PCA scattered infarcts, embolic d/t BA thrombus. Started on coumadin with lovenox bridge. D/c to CIR  Other Active Problems  Anxiety on buspar  FTT with recent unintentional wt loss w/ CTA neck with micronodules in lungs. Hx heavy smoker. CT chest/abd/pelvis neg  Chronic pain/gout on colchicine  Chronic HA on topamax  Hospital day # 0  BIBY,SHARON  [place] for Pager information 11/30/2016 9:53 AM  I have personally examined this patient, reviewed notes, independently viewed imaging studies, participated in medical decision making and plan of care.ROS completed by me personally and pertinent positives fully documented  I have made any additions or clarifications directly to the above note. Agree with note above.   She presented with TIA symptoms secondary to symptomatic basilar artery stenosis. She has failed medical therapy and may need consideration for endovascular treatment if willing. I had a long discussion with the patient and daughter regarding treatment options and risk-benefit off aggressive medical therapy versus endovascular stenting and answered questions. They understand she is at significant risk for recurrent strokes. I counseled her to quit smoking and maintain aggressive risk factor modification. Check diagnostic cervical catheter angiogram to look at endovascular treatment options.greater than 50% time during this 40 minute visit was spent on counseling and coordination of care about her TIA, symptomatic basilar stenosis and discussion of treatment options. Discuss with Dr. Manfred Arch, [place] Pager: [phone] 11/30/2016 7:54 PM  To contact Stroke Continuity provider, please refer to [email or web address] After hours, contact General Neurology

## 2016-11-30 NOTE — Sedation Documentation (Signed)
Patient is resting comfortably. 

## 2016-11-30 NOTE — Sedation Documentation (Signed)
V-pad at R groin

## 2016-11-30 NOTE — Care Management Note (Addendum)
Case Management Note  Patient Details  Name: MIRRIAM VADALA MRN: [id number] Date of Birth: 31-May-1946  Subjective/Objective:   Pt presented for stroke like symptoms. Pt is from home with son. Per daughter son is in and out of the home. Pt has PCP and wants to be switched to Internal Medicine Clinic. CM did establish an appointment and placed on AVS.                Action/Plan: CM discussed [place] Needs with pt in regards to [facility], [place] and Aide. Agency List provided to family and awaiting choice. CM did provide pt with [facility] Brochure and number to Meals [employer] Wheels. Daughter to call DSS to see if pt can get an aide via Medicaid. CM did provide pt with personal care list for out of pocket cost as well. CM will continue to monitor for agency choice.   Expected Discharge Date:                  Expected Discharge Plan:  [place]  In-House Referral:  NA, PCP / [facility]  Discharge planning Services  CM Consult, Follow-up appt scheduled  Post Acute Care Choice:  Home Health Choice offered to:  Patient, Adult Children  DME Arranged:   N/A DME Agency:   N/A  HH Arranged:  RN, Social Work, Nurse's Aide [employer] Agency:   [place]  Status of Service:  In process, will continue to follow  If discussed at Long Length of Stay Meetings, dates discussed:    Additional Comments: 1127 12-01-16 Jacqlyn Krauss, RN,BSN [id number] CM did speak with pt's daughter and sister- agency chosen [facility]. CM did make referral with [facility]. SOC to begin within 24-48 hours post d/c. No further needs from CM at this time.    1049 12-01-16 Jacqlyn Krauss, RN,BSN [id number] CM did speak with patient in regards to agency choice. Daughter not at bedside and CM did call to see if choice has been made. CM awaiting call back from daughter.  Bethena Roys, RN 11/30/2016, 4:00 PM

## 2016-11-30 NOTE — ED Notes (Signed)
Pt. given Tylenol for headache .

## 2016-11-30 NOTE — Progress Notes (Signed)
PROGRESS NOTE  ENVI EAGLESON  ZOX:[id number] DOB: [birth year] DOA: 11/29/2016 PCP: Philis Fendt, MD   Brief Narrative: Mercedes Gardner is a 71 y.o. 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 INR was found to be subtherapeutic at 1.26, the patient endorsing recent dose adjustment in warfarin. CT head showed no acute findings. tPA was not given due to resolved deficits. Neurology was consulted and the patient admitted for further work up. Heparin infusion was started for bridge to therapeutic INR. Subsequent MRI showed no acute ischemia, normal evolution of diffusion-weighted changes at the site of the recent right pontine infarct, and unchanged severe stenosis the midportion of the basilar artery. Due to concern for essentially failing medical therapy, further imaging was recommended. IR performed cerebral arteriography 3/20 which showed ~67% stenosis of midbasilar artery and aberrant origin of right subclavian artery. Plan is to continue lovenox bridge as outpatient once work up is complete.   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 Attributed to midbasilar artery thrombosis which caused CVA in Jan 2018. Was put on coumadin, though presented with significantly subtherapeutic INR (1.26). - Cerebral arteriogram 3/20 by Dr. Estanislado Pandy showed 67% stenosis of basilar artery, no intervention was performed.  - Appreciate neurology recommendations.   Anticoagulated with warfarin; INR level subtherapeutic tonight. - Continue heparin infusion and coumadin per pharmacy; anticipate DC on lovenox bridge. - Continue coumadin and high-intensity  statin - Awaiting therapy evaluations  Anxiety - Continue home buspar BID + single prn dose.   HTN - Permissive HTN: Will continue norvasc  Chronic headache: Stable.  - Continue topamax  Gout: No flare evident - Continue colchicine  DVT prophylaxis: Heparin gtt, coumadin Code Status: Full Family Communication: Granddaughters at bedside Disposition Plan: Continue inpatient work up, anticipate DC to home in AM.  Consultants:   Neurology  Procedures:   Echocardiogram 1/30 EF 60-65%, no source of embolus  Carotid doppler 1/30: No significant stenosis  Cerebral arteriogram by Dr. Estanislado Pandy 3/20: 67% stenosis of midbasilar artery.   Antimicrobials:  None   Subjective: Pt nervous about procedure, but willing to undergo it with additional dose of buspar which she takes at home. No change in mental status, no focal deficits per pt and family at bedside.   Objective: Vitals:   11/30/16 1350 11/30/16 1440 11/30/16 1454 11/30/16 1510  BP: 114/70 109/75 133/68 123/74  Pulse: 90     Resp: 15     Temp:      TempSrc:      SpO2: 100%     Weight:      Height:        Intake/Output Summary (Last 24 hours) at 11/30/16 1540 Last data filed at 11/30/16 1431  Gross per 24 hour  Intake              375 ml  Output                0 ml  Net              375 ml   Filed Weights   11/29/16 1859 11/30/16 0440  Weight: 54.9 kg (121 lb)  56.3 kg (124 lb 1.6 oz)    Examination: General exam: 71 y.o. female in no distress Respiratory system: Non-labored breathing room air. Clear to auscultation bilaterally.  Cardiovascular system: Regular rate and rhythm. No murmur, rub, or gallop. No JVD, and no pedal edema. Gastrointestinal system: Abdomen soft, non-tender, non-distended, with normoactive bowel sounds. No organomegaly or masses felt. Central nervous system: Alert and oriented. No focal neurological deficits. Extremities: Warm, no deformities Skin: No rashes, lesions no  ulcers Psychiatry: Judgement and insight appear normal. Mood & affect appropriate.   Data Reviewed: I have personally reviewed following labs and imaging studies  CBC:  Recent Labs Lab 11/29/16 1918 11/29/16 1934 11/30/16 0358  WBC 6.9  --  7.5  NEUTROABS 3.7  --   --   HGB 12.1 12.6 11.4*  HCT 37.2 37.0 36.0  MCV 94.2  --  93.5  PLT 212  --  983   Basic Metabolic Panel:  Recent Labs Lab 11/29/16 1918 11/29/16 1934  NA 142 144  K 3.6 3.8  CL 111 107  CO2 25  --   GLUCOSE 97 97  BUN 19 23*  CREATININE 0.88 0.80  CALCIUM 10.4*  --    GFR: Estimated Creatinine Clearance (by C-G formula based on SCr of 0.8 mg/dL) Female: 58.1 mL/min Female: 70.4 mL/min Liver Function Tests:  Recent Labs Lab 11/29/16 1918  AST 29  ALT 36  ALKPHOS 86  BILITOT 0.4  PROT 7.2  ALBUMIN 3.9   No results for input(s): LIPASE, AMYLASE in the last 168 hours. No results for input(s): AMMONIA in the last 168 hours. Coagulation Profile:  Recent Labs Lab 11/29/16 1918 11/30/16 0358  INR 1.26 1.33   Cardiac Enzymes: No results for input(s): CKTOTAL, CKMB, CKMBINDEX, TROPONINI in the last 168 hours. BNP (last 3 results) No results for input(s): PROBNP in the last 8760 hours. HbA1C: No results for input(s): HGBA1C in the last 72 hours. CBG: No results for input(s): GLUCAP in the last 168 hours. Lipid Profile: No results for input(s): CHOL, HDL, LDLCALC, TRIG, CHOLHDL, LDLDIRECT in the last 72 hours. Thyroid Function Tests: No results for input(s): TSH, T4TOTAL, FREET4, T3FREE, THYROIDAB in the last 72 hours. Anemia Panel: No results for input(s): VITAMINB12, FOLATE, FERRITIN, TIBC, IRON, RETICCTPCT in the last 72 hours. Urine analysis:    Component Value Date/Time   COLORURINE STRAW (A) 11/30/2016 0402   APPEARANCEUR CLEAR 11/30/2016 0402   LABSPEC 1.005 11/30/2016 0402   PHURINE 7.0 11/30/2016 0402   GLUCOSEU NEGATIVE 11/30/2016 0402   HGBUR NEGATIVE 11/30/2016 0402    BILIRUBINUR NEGATIVE 11/30/2016 0402   KETONESUR NEGATIVE 11/30/2016 0402   PROTEINUR NEGATIVE 11/30/2016 0402   UROBILINOGEN 0.2 07/17/2015 1334   NITRITE NEGATIVE 11/30/2016 0402   LEUKOCYTESUR TRACE (A) 11/30/2016 0402   No results found for this or any previous visit (from the past 240 hour(s)).    Radiology Studies: Ct Head Wo Contrast  Result Date: 11/29/2016 CLINICAL DATA:  Stroke-like symptoms since slurred speech, initial encounter EXAM: CT HEAD WITHOUT CONTRAST TECHNIQUE: Contiguous axial images were obtained from the base of the skull through the vertex without intravenous contrast. COMPARISON:  10/11/2016 FINDINGS: Brain: Some focal decreased attenuation is noted in the pons on the right similar to that seen on the prior MRI examination. No findings to suggest acute hemorrhage, acute infarction or space-occupying mass lesion are noted. Vascular: No hyperdense vessel or unexpected calcification. Skull: Normal. Negative for fracture or focal lesion. Sinuses/Orbits: No acute finding. Other:  None. IMPRESSION: Prior right pontine infarct similar to that seen on prior MRI examination. No acute abnormality noted. Electronically Signed   By: Inez Catalina M.D.   On: 11/29/2016 20:37   Mr Angiogram Head Wo Contrast  Result Date: 11/29/2016 CLINICAL DATA:  Acute slurred speech. History of pontine infarct and basilar artery thrombosis. EXAM: MRI HEAD WITHOUT CONTRAST MRA HEAD WITHOUT CONTRAST TECHNIQUE: Multiplanar, multiecho pulse sequences of the brain and surrounding structures were obtained without intravenous contrast. Angiographic images of the head were obtained using MRA technique without contrast. COMPARISON:  1. Brain MRI 10/11/2016 2. CTA head neck 10/11/2016 FINDINGS: MRI HEAD FINDINGS Brain: No focal diffusion restriction to indicate acute infarct. No intraparenchymal hemorrhage. There is T2 shine through at the site of the previously diagnosed right pontine infarct. The brain  parenchymal signal is otherwise normal. No mass lesion or midline shift. No hydrocephalus or extra-axial fluid collection. The midline structures are normal. No age advanced or lobar predominant atrophy. Vascular: Major intracranial arterial and venous sinus flow voids are preserved. No evidence of chronic microhemorrhage or amyloid angiopathy. Skull and upper cervical spine: The visualized skull base, calvarium, upper cervical spine and extracranial soft tissues are normal. Sinuses/Orbits: No fluid levels or advanced mucosal thickening. No mastoid effusion. Normal orbits. MRA HEAD FINDINGS Intracranial internal carotid arteries: Normal. Anterior cerebral arteries: Absent left A1 segment is a normal congenital variant. Otherwise normal. Middle cerebral arteries: Normal. Posterior communicating arteries: Present on the left. Posterior cerebral arteries: Normal. Basilar artery: There is severe narrowing of the midportion of the basilar artery, unchanged compared to the CTA of 10/11/2016, allowing for modality differences. Vertebral arteries: Codominant. Normal. Superior cerebellar arteries: Normal. Anterior inferior cerebellar arteries: Normal. Posterior inferior cerebellar arteries: Normal. IMPRESSION: 1. No acute ischemia. 2. Normal evolution of diffusion-weighted changes at the site recent right pontine infarct. 3. 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 Electronically Signed   By: Ulyses Jarred M.D.   On: 11/29/2016 23:32   Mr Brain Wo Contrast  Result Date: 11/29/2016 CLINICAL DATA:  Acute slurred speech. History of pontine infarct and basilar artery thrombosis. EXAM: MRI HEAD WITHOUT CONTRAST MRA HEAD WITHOUT CONTRAST TECHNIQUE: Multiplanar, multiecho pulse sequences of the brain and surrounding structures were obtained without intravenous contrast. Angiographic images of the head were obtained using MRA technique without contrast. COMPARISON:  1. Brain MRI 10/11/2016 2. CTA head neck  10/11/2016 FINDINGS: MRI HEAD FINDINGS Brain: No focal diffusion restriction to indicate acute infarct. No intraparenchymal hemorrhage. There is T2 shine through at the site of the previously diagnosed right pontine infarct. The brain parenchymal signal is otherwise normal. No mass lesion or midline shift. No hydrocephalus or extra-axial fluid collection. The midline structures are normal. No age advanced or lobar predominant atrophy. Vascular: Major intracranial arterial and venous sinus flow voids are preserved. No evidence of chronic microhemorrhage or amyloid angiopathy. Skull and upper cervical spine: The visualized skull base, calvarium, upper cervical spine and extracranial soft tissues are normal. Sinuses/Orbits: No fluid levels or advanced mucosal thickening. No mastoid effusion. Normal orbits. MRA HEAD FINDINGS Intracranial internal carotid arteries: Normal. Anterior cerebral arteries: Absent left A1 segment is a normal congenital variant. Otherwise normal. Middle cerebral arteries: Normal. Posterior communicating arteries: Present on the left. Posterior cerebral arteries: Normal. Basilar artery: There is severe narrowing of the midportion of the basilar artery, unchanged compared to the CTA of 10/11/2016, allowing for modality differences. Vertebral arteries: Codominant. Normal. Superior cerebellar arteries: Normal. Anterior inferior cerebellar arteries: Normal.  Posterior inferior cerebellar arteries: Normal. IMPRESSION: 1. No acute ischemia. 2. Normal evolution of diffusion-weighted changes at the site recent right pontine infarct. 3. 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 Electronically Signed   By: Ulyses Jarred M.D.   On: 11/29/2016 23:32    Scheduled Meds: . amLODipine  10 mg Oral Daily  . busPIRone  7.5 mg Oral BID  . colchicine  0.6 mg Oral BID  . rosuvastatin  20 mg Oral Daily  . topiramate  25 mg Oral Daily  . warfarin  5 mg Oral ONCE-1800  . Warfarin -  Pharmacist Dosing Inpatient   Does not apply q1800   Continuous Infusions: . sodium chloride 75 mL/hr at 11/30/16 1500  . heparin       LOS: 0 days   Time spent: 25 minutes.  Vance Gather, MD [facility] Pager [phone]  If 7PM-7AM, please contact night-coverage [email or web address] Password [facility] 11/30/2016, 3:40 PM

## 2016-11-30 NOTE — Progress Notes (Addendum)
Patient ID: Mercedes Gardner, female   DOB: [birth year], 71 y.o.   MRN: [id number]   Have come to pts room to discuss cerebral arteriogram that has been ordered per Dr [name]. She has many questions about procedure I have answered all concerns regarding procedure but she still has questions about treatment. Wants to go home Is refusing cerebral arteriogram She called Sylvia--?family member. I discussed procedure also with Sunday Spillers and she has good understanding of same.  Pt still refusing procedure I suggested she may want to talk with her doctor again if she has more concerns.  Informed RN of refusal.  RN can call or page me if pt changes mind.  Dr Bonner Puna made aware

## 2016-11-30 NOTE — Procedures (Signed)
S/O 4 vessel cerebral arteriogram RT CFA approach. Findings. 1.Approx 67% stenosis of  midbasilar artery. 2.Aberrant origin of RT subclavian artery

## 2016-11-30 NOTE — ED Notes (Signed)
Pt is resting.  No focal weakness.  Pt states that she feels like her facial and voice changes have resolved.  Pt notified that we are holding for a bed.  Lights dimmed for comfort.  Pt has blankets.  No acute distress

## 2016-11-30 NOTE — Progress Notes (Signed)
Rea for Heparin/warfarin Indication: basilar artery thrombus s/p recent CVA  Allergies  Allergen Reactions  . Tramadol Other (See Comments)    Depression, moody, increased back pain    Patient Measurements: Height: 5\' 4"  (162.6 cm) Weight: 124 lb 1.6 oz (56.3 kg) IBW/kg (Calculated) : 54.7 Heparin Dosing Weight: 55kg  Vital Signs: Temp: 98.2 F (36.8 C) (03/20 0440) Temp Source: Oral (03/20 0440) BP: 120/65 (03/20 0440) Pulse Rate: 62 (03/20 0440)  Labs:  Recent Labs  11/29/16 1918 11/29/16 1934 11/30/16 0358  HGB 12.1 12.6 11.4*  HCT 37.2 37.0 36.0  PLT 212  --  210  APTT 28  --   --   LABPROT 15.9*  --  16.6*  INR 1.26  --  1.33  HEPARINUNFRC  --   --  0.23*  CREATININE 0.88 0.80  --     Estimated Creatinine Clearance (by C-G formula based on SCr of 0.8 mg/dL) Female: 58.1 mL/min Female: 70.4 mL/min  Assessment: 71 yo female admitted with TIA, h/o recent CVA and INR subtherapeutic, for heparin and Coumadin.     Goal of Therapy:  INR 2-3  Heparin level  0.3-0.5 Monitor platelets by anticoagulation protocol: Yes   Plan:  Increase Heparin 900 units/hr Check heparin level in 8 hours.  Coumadin 5 mg tonight  Phillis Knack, PharmD, BCPS

## 2016-11-30 NOTE — Progress Notes (Signed)
Initial Nutrition Assessment  DOCUMENTATION CODES:   Severe malnutrition in context of chronic illness  INTERVENTION:  Ensure Enlive TID between meals. Each supplement provides 350 kcal and 20 grams of protein.  Mighty Shake BID with meals. Each supplement provides 23-25 grams of protein and 480-500 kcal.   NUTRITION DIAGNOSIS:   Malnutrition related to chronic illness as evidenced by severe depletion of muscle mass, energy intake < 75% for > or equal to 1 month, percent weight loss.  GOAL:   Patient will meet greater than or equal to 90% of their needs   MONITOR:   Supplement acceptance, I & O's, PO intake, Labs, Weight trends  REASON FOR ASSESSMENT:   Malnutrition Screening Tool    ASSESSMENT:   71 y.o. 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  Patient was alert and eating lunch at time of consult. Patient had several family members in the room.Patient reported no current nausea or vomiting.   Daughter reported that patient has lost 30 lb over the past 6 months. This is a 19% weight loss over 6 months, which is significant for this time frame. Patient reported her UBW is "150 something." Patient reported she was barely eating 1 meal per day PTA for over a year. Patient lives with brother and per daughter, the brother is not preparing food for patient like he should.  Patient reported she likes Ensure and agreed to drink supplement during admission to supplement her current diet with extra protein and calories.   Nutrition focused physical exam performed. Findings were moderate fat depletion and severe muscle depletion.  Meds Reviewed: Norvasc, Crestor, Coumadin  Labs Reviewed  Diet Order:  Diet Heart Room service appropriate? Yes; Fluid consistency: Thin  Skin:   Reviewed, no issues  Last BM:  3/19  Height:   Ht Readings from Last 1 Encounters:  11/30/16 5\' 4"  (1.626 m)    Weight:   Wt Readings from Last 1 Encounters:  11/30/16 124 lb 1.6 oz (56.3 kg)    Ideal Body Weight:  54.54 kg  BMI:  Body mass index is 21.3 kg/m.  Estimated Nutritional Needs:   Kcal:  1500-1700  Protein:  75-85 grams/day  Fluid:  >/=1.5 L/day  EDUCATION NEEDS:   No education needs identified at this time  Juliann Pulse M.S. Nutrition Dietetic Intern

## 2016-11-30 NOTE — Consult Note (Signed)
Chief Complaint: Patient was seen in consultation today for cerebral arteriogram Chief Complaint  Patient presents with  . Transient Ischemic Attack   at the request of Dr Royal Hawthorn  Referring Physician(s): Dr Royal Hawthorn  Supervising Physician: Luanne Bras  Patient Status: [facility] - In-pt  History of Present Illness: Mercedes Gardner is a 71 y.o. female   Hx CVA 09/2016 On coumadin for basilar artery thrombus Presented to ED 3/19 with transient slurred speech Noted subtherapeutic INR Neuro symptoms resolved 3/19 MRA: IMPRESSION: 1. No acute ischemia. 2. Normal evolution of diffusion-weighted changes at the site recent right pontine infarct. 3. 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  Now scheduled for cerebral arteriogram per Dr Leonie Man request  Past Medical History:  Diagnosis Date  . Anemia   . Anxiety   . Arthritis    knees   . Dysrhythmia    heart skips a beat   . GERD (gastroesophageal reflux disease)   . Gout   . Hyperlipidemia April 2014  . Hypertension   . Shortness of breath dyspnea    due to pressure of cyst per patient     Past Surgical History:  Procedure Laterality Date  . CESAREAN SECTION     x3  . OVARIAN CYST SURGERY Right 1980's  . SALPINGOOPHORECTOMY Bilateral 02/06/2015   Procedure: Campbell Stall LAPAROTOMY/BILATERAL SALPINGO OOPHORECTOMY;  Surgeon: Everitt Amber, MD;  Location: [facility] OR[facility];  Service: Gynecology;  Laterality: Bilateral;    Allergies: Tramadol  Medications: Prior to Admission medications   Medication Sig Start Date End Date Taking? Authorizing Provider  acetaminophen-codeine (TYLENOL #3) 300-30 MG tablet Take 1 tablet by mouth daily as needed (pain).  11/03/16  Yes Historical Provider, MD  amLODipine (NORVASC) 10 MG tablet Take 10 mg by mouth daily. 11/29/16  Yes Historical Provider, MD  busPIRone (BUSPAR) 7.5 MG tablet Take 1 tablet (7.5 mg total) by mouth 2 (two) times daily. Patient taking differently:  Take 7.5 mg by mouth See admin instructions. Take 1 tablet (7.5 mg) by mouth twice daily, may also take 1 tablet (7.5 mg) during the day as needed for anxiety 10/22/16  Yes Daniel J Angiulli, PA-C  colchicine 0.6 MG tablet Take 1 tablet (0.6 mg total) by mouth 2 (two) times daily. Patient taking differently: Take 0.6 mg by mouth 2 (two) times daily. Colcrys 10/22/16  Yes Daniel J Angiulli, PA-C  diclofenac sodium (VOLTAREN) 1 % GEL Apply 4 g topically 4 (four) times daily. To both knees, please instruct in dosing. Patient taking differently: Apply 1 application topically 2 (two) times daily as needed (knee pain).  10/22/16  Yes Daniel J Angiulli, PA-C  ferrous sulfate 325 (65 FE) MG tablet Take 1 tablet (325 mg total) by mouth 2 (two) times daily with a meal. 10/22/16  Yes Daniel J Angiulli, PA-C  fluticasone (FLONASE) 50 MCG/ACT nasal spray Place 2 sprays into both nostrils 2 (two) times daily.  04/17/14  Yes Audelia Hives Presson, PA  folic acid (FOLVITE) 1 MG tablet Take 1 tablet (1 mg total) by mouth daily. 10/22/16  Yes Daniel J Angiulli, PA-C  Multiple Vitamin (MULTIVITAMIN WITH MINERALS) TABS tablet Take 1 tablet by mouth daily. 10/22/16  Yes Daniel J Angiulli, PA-C  rosuvastatin (CRESTOR) 20 MG tablet Take 20 mg by mouth daily. 10/26/16  Yes Historical Provider, MD  Tetrahydroz-Dextran-PEG-Povid 0.05-0.1-1-1 % SOLN Place 1 drop into both eyes daily as needed (dry eyes). Generic OTC eye drops   Yes Historical Provider,  MD  topiramate (TOPAMAX) 25 MG tablet Take 1 tablet (25 mg total) by mouth daily. 10/22/16  Yes Daniel J Angiulli, PA-C  warfarin (COUMADIN) 5 MG tablet Take 1 tablet (5 mg total) by mouth daily at 6 PM. Patient taking differently: Take 2.5 mg by mouth daily at 6 PM.  10/22/16  Yes Lavon Paganini Angiulli, PA-C     Family History  Problem Relation Age of Onset  . Cancer Mother   . Hypertension Mother   . Diabetes Mother   . Diabetes Sister   . Hypertension Sister     Social History   Social  History  . Marital status: Divorced    Spouse name: N/A  . Number of children: N/A  . Years of education: N/A   Social History Main Topics  . Smoking status: Current Some Day Smoker    Packs/day: 0.02    Types: Cigarettes  . Smokeless tobacco: Never Used     Comment: 2 cigarettes per pday on 01/31/15    . Alcohol use No  . Drug use: No  . Sexual activity: No   Other Topics Concern  . None   Social History Narrative  . None    Review of Systems: A 12 point ROS discussed and pertinent positives are indicated in the HPI above.  All other systems are negative.  Review of Systems  Constitutional: Negative for activity change, appetite change, fatigue and fever.  HENT: Negative for facial swelling, hearing loss and tinnitus.   Eyes: Negative for visual disturbance.  Respiratory: Negative for cough and shortness of breath.   Cardiovascular: Negative for chest pain.  Gastrointestinal: Negative for abdominal pain.  Musculoskeletal: Negative for back pain and gait problem.  Neurological: Positive for speech difficulty. Negative for dizziness, tremors, seizures, syncope, facial asymmetry, weakness, light-headedness, numbness and headaches.  Psychiatric/Behavioral: Negative for behavioral problems and confusion.    Vital Signs: BP (!) 112/57 (BP Location: Left Arm)   Pulse 87   Temp 98.6 F (37 C) (Oral)   Resp 14   Ht 5\' 4"  (1.626 m)   Wt 124 lb 1.6 oz (56.3 kg)   SpO2 100%   BMI 21.30 kg/m   Physical Exam  Constitutional: She is oriented to person, place, and time.  Cardiovascular: Normal rate, regular rhythm and normal heart sounds.   Pulmonary/Chest: Effort normal and breath sounds normal.  Abdominal: Soft. Bowel sounds are normal.  Musculoskeletal: Normal range of motion.  Neurological: She is alert and oriented to person, place, and time.  Skin: Skin is warm and dry.  Psychiatric: She has a normal mood and affect. Her behavior is normal. Judgment and thought content  normal.  Daughter April at bedside  Nursing note and vitals reviewed.   Mallampati Score:  MD Evaluation Airway: WNL Heart: WNL Abdomen: WNL Chest/ Lungs: WNL ASA  Classification: 3 Mallampati/Airway Score: One  Imaging: Ct Head Wo Contrast  Result Date: 11/29/2016 CLINICAL DATA:  Stroke-like symptoms since slurred speech, initial encounter EXAM: CT HEAD WITHOUT CONTRAST TECHNIQUE: Contiguous axial images were obtained from the base of the skull through the vertex without intravenous contrast. COMPARISON:  10/11/2016 FINDINGS: Brain: Some focal decreased attenuation is noted in the pons on the right similar to that seen on the prior MRI examination. No findings to suggest acute hemorrhage, acute infarction or space-occupying mass lesion are noted. Vascular: No hyperdense vessel or unexpected calcification. Skull: Normal. Negative for fracture or focal lesion. Sinuses/Orbits: No acute finding. Other: None. IMPRESSION: Prior right pontine  infarct similar to that seen on prior MRI examination. No acute abnormality noted. Electronically Signed   By: Inez Catalina M.D.   On: 11/29/2016 20:37   Mr Angiogram Head Wo Contrast  Result Date: 11/29/2016 CLINICAL DATA:  Acute slurred speech. History of pontine infarct and basilar artery thrombosis. EXAM: MRI HEAD WITHOUT CONTRAST MRA HEAD WITHOUT CONTRAST TECHNIQUE: Multiplanar, multiecho pulse sequences of the brain and surrounding structures were obtained without intravenous contrast. Angiographic images of the head were obtained using MRA technique without contrast. COMPARISON:  1. Brain MRI 10/11/2016 2. CTA head neck 10/11/2016 FINDINGS: MRI HEAD FINDINGS Brain: No focal diffusion restriction to indicate acute infarct. No intraparenchymal hemorrhage. There is T2 shine through at the site of the previously diagnosed right pontine infarct. The brain parenchymal signal is otherwise normal. No mass lesion or midline shift. No hydrocephalus or extra-axial  fluid collection. The midline structures are normal. No age advanced or lobar predominant atrophy. Vascular: Major intracranial arterial and venous sinus flow voids are preserved. No evidence of chronic microhemorrhage or amyloid angiopathy. Skull and upper cervical spine: The visualized skull base, calvarium, upper cervical spine and extracranial soft tissues are normal. Sinuses/Orbits: No fluid levels or advanced mucosal thickening. No mastoid effusion. Normal orbits. MRA HEAD FINDINGS Intracranial internal carotid arteries: Normal. Anterior cerebral arteries: Absent left A1 segment is a normal congenital variant. Otherwise normal. Middle cerebral arteries: Normal. Posterior communicating arteries: Present on the left. Posterior cerebral arteries: Normal. Basilar artery: There is severe narrowing of the midportion of the basilar artery, unchanged compared to the CTA of 10/11/2016, allowing for modality differences. Vertebral arteries: Codominant. Normal. Superior cerebellar arteries: Normal. Anterior inferior cerebellar arteries: Normal. Posterior inferior cerebellar arteries: Normal. IMPRESSION: 1. No acute ischemia. 2. Normal evolution of diffusion-weighted changes at the site recent right pontine infarct. 3. 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 Electronically Signed   By: Ulyses Jarred M.D.   On: 11/29/2016 23:32   Mr Brain Wo Contrast  Result Date: 11/29/2016 CLINICAL DATA:  Acute slurred speech. History of pontine infarct and basilar artery thrombosis. EXAM: MRI HEAD WITHOUT CONTRAST MRA HEAD WITHOUT CONTRAST TECHNIQUE: Multiplanar, multiecho pulse sequences of the brain and surrounding structures were obtained without intravenous contrast. Angiographic images of the head were obtained using MRA technique without contrast. COMPARISON:  1. Brain MRI 10/11/2016 2. CTA head neck 10/11/2016 FINDINGS: MRI HEAD FINDINGS Brain: No focal diffusion restriction to indicate acute infarct.  No intraparenchymal hemorrhage. There is T2 shine through at the site of the previously diagnosed right pontine infarct. The brain parenchymal signal is otherwise normal. No mass lesion or midline shift. No hydrocephalus or extra-axial fluid collection. The midline structures are normal. No age advanced or lobar predominant atrophy. Vascular: Major intracranial arterial and venous sinus flow voids are preserved. No evidence of chronic microhemorrhage or amyloid angiopathy. Skull and upper cervical spine: The visualized skull base, calvarium, upper cervical spine and extracranial soft tissues are normal. Sinuses/Orbits: No fluid levels or advanced mucosal thickening. No mastoid effusion. Normal orbits. MRA HEAD FINDINGS Intracranial internal carotid arteries: Normal. Anterior cerebral arteries: Absent left A1 segment is a normal congenital variant. Otherwise normal. Middle cerebral arteries: Normal. Posterior communicating arteries: Present on the left. Posterior cerebral arteries: Normal. Basilar artery: There is severe narrowing of the midportion of the basilar artery, unchanged compared to the CTA of 10/11/2016, allowing for modality differences. Vertebral arteries: Codominant. Normal. Superior cerebellar arteries: Normal. Anterior inferior cerebellar arteries: Normal. Posterior inferior cerebellar arteries: Normal.  IMPRESSION: 1. No acute ischemia. 2. Normal evolution of diffusion-weighted changes at the site recent right pontine infarct. 3. 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 Electronically Signed   By: Ulyses Jarred M.D.   On: 11/29/2016 23:32    Labs:  CBC:  Recent Labs  10/14/16 0402 10/15/16 0505 11/29/16 1918 11/29/16 1934 11/30/16 0358  WBC 6.9 8.5 6.9  --  7.5  HGB 11.1* 11.4* 12.1 12.6 11.4*  HCT 35.2* 36.1 37.2 37.0 36.0  PLT 220 224 212  --  210    COAGS:  Recent Labs  10/10/16 1035  10/21/16 0508 10/22/16 0412 11/29/16 1918 11/30/16 0358  INR  0.99  < > 2.00 2.23 1.26 1.33  APTT 28  --   --   --  28  --   < > = values in this interval not displayed.  BMP:  Recent Labs  10/13/16 0426 10/14/16 0402 10/15/16 0505 11/29/16 1918 11/29/16 1934  NA 139 140 139 142 144  K 4.1 3.7 3.5 3.6 3.8  CL 109 107 104 111 107  CO2 23 24 26 25   --   GLUCOSE 107* 124* 170* 97 97  BUN 13 17 15 19  23*  CALCIUM 9.9 10.3 10.1 10.4*  --   CREATININE 0.83 0.83 0.81 0.88 0.80  GFRNONAA >60 >60 >60 >60  --   GFRAA >60 >60 >60 >60  --     LIVER FUNCTION TESTS:  Recent Labs  10/11/16 0921 10/12/16 0547 10/15/16 0505 11/29/16 1918  BILITOT 0.6 0.6 0.4 0.4  AST 19 16 38 29  ALT 16 14 32 36  ALKPHOS 106 100 93 86  PROT 7.3 7.1 6.7 7.2  ALBUMIN 3.6 3.2* 3.3* 3.9    TUMOR MARKERS: No results for input(s): AFPTM, CEA, CA199, CHROMGRNA in the last 8760 hours.  Assessment and Plan:  Basilar artery thrombus CVA x 2 Now for cerebral arteriogram for evaluation Risks and Benefits discussed with the patient including, but not limited to bleeding, infection, vascular injury, contrast induced renal failure, stroke or even death. All of the patient's questions were answered, patient is agreeable to proceed. Consent signed and in chart.  Thank you for this interesting consult.  I greatly enjoyed meeting BASYA CASAVANT and look forward to participating in their care.  A copy of this report was sent to the requesting provider on this date.  Electronically Signed: Monia Sabal A 11/30/2016, 10:48 AM   I spent a total of 40 Minutes    in face to face in clinical consultation, greater than 50% of which was counseling/coordinating care for cerebral arteriogram

## 2016-12-01 ENCOUNTER — Encounter (HOSPITAL_COMMUNITY): Payer: Self-pay | Admitting: Interventional Radiology

## 2016-12-01 DIAGNOSIS — E43 Unspecified severe protein-calorie malnutrition: Secondary | ICD-10-CM | POA: Insufficient documentation

## 2016-12-01 LAB — CBC
HCT: 36.9 % (ref 36.0–46.0)
HEMOGLOBIN: 11.7 g/dL — AB (ref 12.0–15.0)
MCH: 29.8 pg (ref 26.0–34.0)
MCHC: 31.7 g/dL (ref 30.0–36.0)
MCV: 94.1 fL (ref 78.0–100.0)
Platelets: 204 10*3/uL (ref 150–400)
RBC: 3.92 MIL/uL (ref 3.87–5.11)
RDW: 13 % (ref 11.5–15.5)
WBC: 7.2 10*3/uL (ref 4.0–10.5)

## 2016-12-01 LAB — PROTIME-INR
INR: 1.57
PROTHROMBIN TIME: 19 s — AB (ref 11.4–15.2)

## 2016-12-01 LAB — HEPARIN LEVEL (UNFRACTIONATED)
HEPARIN UNFRACTIONATED: 0.35 [IU]/mL (ref 0.30–0.70)
Heparin Unfractionated: 0.64 IU/mL (ref 0.30–0.70)

## 2016-12-01 MED ORDER — ASPIRIN 325 MG PO TBEC: 325.0000 mg | DELAYED_RELEASE_TABLET | Freq: Every day | ORAL | 0 refills | Status: DC

## 2016-12-01 MED ORDER — CLOPIDOGREL BISULFATE 75 MG PO TABS
75.0000 mg | ORAL_TABLET | Freq: Every day | ORAL | Status: DC
  Administered 2016-12-01: 75 mg via ORAL
  Filled 2016-12-01: qty 1

## 2016-12-01 MED ORDER — CLOPIDOGREL BISULFATE 75 MG PO TABS: 75.0000 mg | ORAL_TABLET | Freq: Every day | ORAL | 0 refills | Status: DC

## 2016-12-01 MED ORDER — ASPIRIN EC 325 MG PO TBEC
325.0000 mg | DELAYED_RELEASE_TABLET | Freq: Every day | ORAL | Status: DC
  Administered 2016-12-01: 325 mg via ORAL
  Filled 2016-12-01: qty 1

## 2016-12-01 NOTE — Progress Notes (Signed)
[place] for Heparin/warfarin Indication: basilar artery thrombus s/p recent CVA  Allergies  Allergen Reactions  . Tramadol Other (See Comments)    Depression, moody, increased back pain    Patient Measurements: Height: 5\' 4"  (162.6 cm) Weight: 124 lb 1.6 oz (56.3 kg) IBW/kg (Calculated) : 54.7 Heparin Dosing Weight: 55kg  Vital Signs: Temp: 97.5 F (36.4 C) (03/21 0027) Temp Source: Oral (03/21 0027) BP: 107/66 (03/21 0027) Pulse Rate: 76 (03/21 0027)  Labs:  Recent Labs  11/29/16 1918 11/29/16 1934 11/30/16 0358 11/30/16 2346  HGB 12.1 12.6 11.4*  --   HCT 37.2 37.0 36.0  --   PLT 212  --  210  --   APTT 28  --   --   --   LABPROT 15.9*  --  16.6*  --   INR 1.26  --  1.33  --   HEPARINUNFRC  --   --  0.23* 0.35  CREATININE 0.88 0.80  --   --     Estimated Creatinine Clearance (by C-G formula based on SCr of 0.8 mg/dL) Female: 58.1 mL/min Female: 70.4 mL/min  Assessment: 71 yo female admitted with TIA, h/o recent CVA and INR subtherapeutic, for heparin.     Goal of Therapy:  INR 2-3  Heparin level  0.3-0.5 Monitor platelets by anticoagulation protocol: Yes   Plan:  Continue Heparin at current rate   Phillis Knack, PharmD, BCPS

## 2016-12-01 NOTE — Progress Notes (Signed)
STROKE TEAM PROGRESS NOTE   SUBJECTIVE (INTERVAL HISTORY) No new complaints. Will treat medically, no intervention.Discuss angiogram findings with Dr. Estanislado Pandy and patient   OBJECTIVE Temp:  [97.5 F (36.4 C)-98.8 F (37.1 C)] 98.6 F (37 C) (03/21 0300) Pulse Rate:  [76-95] 85 (03/21 0300) Cardiac Rhythm: Sinus tachycardia (03/21 0731) Resp:  [9-20] 16 (03/21 0300) BP: (98-133)/(60-88) 108/68 (03/21 0853) SpO2:  [98 %-100 %] 98 % (03/21 0027)  CBC:  Recent Labs Lab 11/29/16 1918  11/30/16 0358 12/01/16 0554  WBC 6.9  --  7.5 7.2  NEUTROABS 3.7  --   --   --   HGB 12.1  < > 11.4* 11.7*  HCT 37.2  < > 36.0 36.9  MCV 94.2  --  93.5 94.1  PLT 212  --  210 204  < > = values in this interval not displayed.  Basic Metabolic Panel:   Recent Labs Lab 11/29/16 1918 11/29/16 1934  NA 142 144  K 3.6 3.8  CL 111 107  CO2 25  --   GLUCOSE 97 97  BUN 19 23*  CREATININE 0.88 0.80  CALCIUM 10.4*  --     Urine Drug Screen:     Component Value Date/Time   LABOPIA NONE DETECTED 11/30/2016 0402   COCAINSCRNUR NONE DETECTED 11/30/2016 0402   LABBENZ NONE DETECTED 11/30/2016 0402   AMPHETMU NONE DETECTED 11/30/2016 0402   THCU NONE DETECTED 11/30/2016 0402   LABBARB NONE DETECTED 11/30/2016 0402    Cerebral catheter angiogram 11/30/16: 67% stenosis of mid basilar artery. Aberrant origin of right subclavian artery. PHYSICAL EXAM Frail middle aged african american lady not in distress.  . Afebrile. Head is nontraumatic. Neck is supple without bruit.    Cardiac exam no murmur or gallop. Lungs are clear to auscultation. Distal pulses are well felt. Neurological Exam ;  Awake  Alert oriented x 3. Normal speech and language.eye movements full without nystagmus.fundi were not visualized. Vision acuity and fields appear normal. Hearing is normal. Palatal movements are normal. Face symmetric. Tongue midline. Normal strength, tone, reflexes and coordination. Normal sensation. Gait  deferred.  ASSESSMENT/PLAN Ms. SHAILYNN FONG is a 71 y.o. 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 She did not receive IV t-PA due to deficits resolved.   BA thrombosis  Resultant  Neuro deficits resolved  CT head old R pontine infarct   MRI  No acute stroke. Normal evolution R pontine infarct  MRA  Severe stenosis BA, unchanged  Carotid Doppler  1/30 No significant stenosis   LE doppler 1/30 no DVT  2D Echo  1/30 EF 60-65%, no SOE  Cerebral angiogram 67% mid BA stenosis  LDL 129 in Jan  HgbA1c 5.4 in Jan  IV heparin/coumadin for VTE prophylaxis Diet Heart Room service appropriate? Yes; Fluid consistency: Thin  warfarin daily prior to admission with subtherapeutic INR 1.2 on admission, started on IV heparin as bridge to  warfarin daily. Given angio finding of large vessel disease, will change to aspirin and plavix.  Continue with medical therapy. No surgical intervention recommended.  Therapy recommendations:  No new therapy needs identified  Disposition:  Return home  [place] for discharge later today from stroke standpoint. Will sign off.  Follow up with Dr. Krista Blue at already scheduled 12/06/2016 at 1000  Hypertension  Stable BP goal normotensive  Hyperlipidemia  Home meds:  lipitor 80 mg daily  Changed to Crestor 20  LDL 129 in Jan  Continue statin at discharge  Other Stroke Risk Factors  Advanced age  Cigarette smoker, says she stopped smoking since last admission, started on nicotine patch last admission   UDS negative  Hx stroke/TIA  10/2016 B pontine, R cerebellar, R PCA scattered infarcts, embolic d/t BA thrombus. Started on coumadin with lovenox bridge. D/c to CIR  Other Active Problems  Anxiety on buspar  FTT with recent unintentional wt loss w/ CTA neck with micronodules in lungs. Hx heavy smoker. CT chest/abd/pelvis neg  Chronic pain/gout  on colchicine  Chronic HA on topamax  Hospital day # [place] for Pager information 12/01/2016 9:51 AM  I have personally examined this patient, reviewed notes, independently viewed imaging studies, participated in medical decision making and plan of care.ROS completed by me personally and pertinent positives fully documented  I have made any additions or clarifications directly to the above note. Recommend continue aggressive medical management and stop IV heparin and changed to aspirin 325 mg and Plavix 75 mg daily along with aggressive risk factor modification. Discussed with patient and Dr. Estanislado Pandy and Dr. Bonner Puna. Stroke team will sign off. Follow-up as an outpatient in the stroke clinic with Dr. Erlinda Hong in 6 weeks. Greater than 50% time during this 25 minute visit was spent on counseling and coordination of care about her symptomatic basilar stenosis, discussion of treatment options and answered questions  Antony Contras, MD Medical Director [place] Pager: [phone] 12/01/2016 3:01 PM  To contact Stroke Continuity provider, please refer to [email or web address] After hours, contact General Neurology

## 2016-12-01 NOTE — Discharge Summary (Signed)
Physician Discharge Summary  Mercedes Gardner GBT:[id number] DOB: Jun 25, 1946 DOA: 11/29/2016  PCP: Philis Fendt, MD  Admit date: 11/29/2016 Discharge date: 12/01/2016  Admitted From: Home Disposition: Home   Recommendations for Outpatient Follow-up:  1. Follow up with PCP in 1-2 weeks 2. 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 INR was found to be subtherapeutic at 1.26, the patient endorsing recent dose adjustment in warfarin. CT head showed no acute findings. tPA was not given due to resolved deficits. Neurology was consulted and the patient admitted for further work up. Heparin infusion was started for bridge to therapeutic INR. Subsequent MRI showed no acute ischemia, normal evolution of diffusion-weighted changes at the site of the recent right pontine infarct, and unchanged severe stenosis the midportion of the basilar artery. Due to concern for essentially failing medical therapy, further imaging was recommended. IR performed cerebral arteriography 3/20 which showed ~67% stenosis of midbasilar artery and aberrant origin of right subclavian artery. No intervention was deemed necessary. Per stroke team, the patient will discontinue warfarin (questionable adherence) and start aspirin and plavix. She is discharged with no focal deficits, with follow up scheduled to establish care at neurology and IM clinic.   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 Attributed to midbasilar artery thrombosis which caused CVA in Jan 2018. Was put on coumadin, though presented with significantly subtherapeutic INR (1.26). - Cerebral arteriogram 3/20 by Dr. Estanislado Pandy showed 67% stenosis of basilar artery, no intervention was performed.  - Appreciate neurology recommendations. Anticoagulated with warfarin PTA, severely subtherapeutic > plan to discharge on plavix and aspirin.  - Continue high-intensity statin  Anxiety - Continue home buspar BID + single prn dose.   HTN - Will continue norvasc  Chronic headache: Stable.  - Continue topamax  Gout: No flare evident - Continue colchicine  Severe protein-calorie malnutrition:  - Ensure Enlive TID between meals. Each supplement provides 350 kcal and 20 grams of protein. - Mighty Shake BID with meals. Each supplement provides 23-25 grams of protein and 480-500 kcal.  Discharge Instructions Discharge Instructions    Discharge instructions    Complete by:  As directed    You were admitted with stroke-like symptoms and found to have an incomplete blockage in an artery leading to the brain. The treatment for this is medical, so you are stable for discharge with the following recommendations:  - STOP taking coumadin. You do not need to have your INR checked anymore.  - START taking aspirin and plavix everyday (this replaces coumadin, thinning your blood to reduce risk of clots). Report any unusual bleeding or bruising to your doctor right away. These have been sent to your pharmacy ([facility]) - Continue taking other medications, including crestor.  - Eat a healthy diet with low salt and plenty of vegetables. Stay active.  -  If you notice any return of symptoms, seek medical care right away.      Allergies as of 12/01/2016      Reactions   Tramadol Other (See Comments)   Depression, moody, increased back pain      Medication List    STOP taking these medications   warfarin 5 MG tablet Commonly known as:  COUMADIN     TAKE these medications   acetaminophen-codeine 300-30 MG tablet Commonly known as:  TYLENOL #3 Take 1 tablet by mouth daily as needed (pain).   amLODipine 10 MG tablet Commonly known as:  NORVASC Take 10 mg by mouth daily.   aspirin 325 MG EC tablet Take 1 tablet (325 mg total) by mouth daily.   busPIRone 7.5 MG tablet Commonly known as:  BUSPAR Take 1 tablet (7.5 mg total) by mouth 2 (two) times daily. What changed:  when to take this  additional instructions   clopidogrel 75 MG tablet Commonly known as:  PLAVIX Take 1 tablet (75 mg total) by mouth daily.   colchicine 0.6 MG tablet Take 1 tablet (0.6 mg total) by mouth 2 (two) times daily. What changed:  additional instructions   diclofenac sodium 1 % Gel Commonly known as:  VOLTAREN Apply 4 g topically 4 (four) times daily. To both knees, please instruct in dosing. What changed:  how much to take  when to take this  reasons to take this  additional instructions   ferrous sulfate 325 (65 FE) MG tablet Take 1 tablet (325 mg total) by mouth 2 (two) times daily with a meal.   fluticasone 50 MCG/ACT nasal spray Commonly known as:  FLONASE Place 2 sprays into both nostrils 2 (two) times daily.   folic acid 1 MG tablet Commonly known as:  FOLVITE Take 1 tablet (1 mg total) by mouth daily.   multivitamin with minerals Tabs tablet Take 1 tablet by mouth daily.   rosuvastatin 20 MG tablet Commonly known as:  CRESTOR Take 20 mg by mouth daily.   Tetrahydroz-Dextran-PEG-Povid 0.05-0.1-1-1 % Soln Place 1 drop into both eyes daily as needed (dry eyes). Generic OTC eye drops   topiramate 25 MG tablet Commonly known as:  TOPAMAX Take 1 tablet (25 mg total) by mouth daily.       Follow-up [facility] Follow up on 12/07/2016.   Why:  @ 9:15 am for new PCP. Pt to meet with Clinic MD for the month and then PCP will be established.  Contact information: [address] [phone]       Marcial Pacas, MD Follow up on 12/06/2016.   Specialty:  Neurology Why:  at 1000 - previously scheduled Contact information: [place] [address] [phone]        [facility] Follow up.   Why:  Registered Nurse, Physical Therapy, Education officer, museum.  Contact information: [place] [address] [phone]          Allergies  Allergen Reactions  . Tramadol Other (See Comments)    Depression, moody, increased back pain    Consultations:  Stroke team, Dr. Leonie Man  Procedures/Studies: Ct Head Wo Contrast  Result Date: 11/29/2016 CLINICAL DATA:  Stroke-like symptoms since slurred speech, initial encounter EXAM: CT HEAD WITHOUT CONTRAST TECHNIQUE: Contiguous axial images were obtained from the base of the skull through the vertex without intravenous contrast. COMPARISON:  10/11/2016 FINDINGS: Brain: Some focal decreased  attenuation is noted in the pons on the right similar to that seen on the prior MRI examination. No findings to suggest acute hemorrhage, acute infarction or space-occupying mass lesion are noted. Vascular: No hyperdense vessel or unexpected calcification. Skull: Normal. Negative for fracture or focal lesion. Sinuses/Orbits: No acute finding. Other: None. IMPRESSION: Prior right pontine infarct similar to that seen on prior MRI examination. No acute abnormality noted. Electronically Signed   By: Inez Catalina M.D.   On: 11/29/2016 20:37   Mr Angiogram Head Wo Contrast  Result Date: 11/29/2016 CLINICAL DATA:  Acute slurred speech. History of pontine infarct and basilar artery thrombosis. EXAM: MRI HEAD WITHOUT CONTRAST MRA HEAD  WITHOUT CONTRAST TECHNIQUE: Multiplanar, multiecho pulse sequences of the brain and surrounding structures were obtained without intravenous contrast. Angiographic images of the head were obtained using MRA technique without contrast. COMPARISON:  1. Brain MRI 10/11/2016 2. CTA head neck 10/11/2016 FINDINGS: MRI HEAD FINDINGS Brain: No focal diffusion restriction to indicate acute infarct. No intraparenchymal hemorrhage. There is T2 shine through at the site of the previously diagnosed right pontine infarct. The brain parenchymal signal is otherwise normal. No mass lesion or midline shift. No hydrocephalus or extra-axial fluid collection. The midline structures are normal. No age advanced or lobar predominant atrophy. Vascular: Major intracranial arterial and venous sinus flow voids are preserved. No evidence of chronic microhemorrhage or amyloid angiopathy. Skull and upper cervical spine: The visualized skull base, calvarium, upper cervical spine and extracranial soft tissues are normal. Sinuses/Orbits: No fluid levels or advanced mucosal thickening. No mastoid effusion. Normal orbits. MRA HEAD FINDINGS Intracranial internal carotid arteries: Normal. Anterior cerebral arteries: Absent left A1 segment is a normal congenital variant. Otherwise normal. Middle cerebral arteries: Normal. Posterior communicating arteries: Present on the left. Posterior cerebral arteries: Normal. Basilar artery: There is severe narrowing of the midportion of the basilar artery, unchanged compared to the CTA of 10/11/2016, allowing for modality differences. Vertebral arteries: Codominant. Normal. Superior cerebellar arteries: Normal. Anterior inferior cerebellar arteries: Normal. Posterior inferior cerebellar arteries: Normal. IMPRESSION: 1. No acute ischemia. 2. Normal evolution of diffusion-weighted changes at the site recent right pontine infarct. 3. 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 Electronically Signed    By: Ulyses Jarred M.D.   On: 11/29/2016 23:32   Mr Brain Wo Contrast  Result Date: 11/29/2016 CLINICAL DATA:  Acute slurred speech. History of pontine infarct and basilar artery thrombosis. EXAM: MRI HEAD WITHOUT CONTRAST MRA HEAD WITHOUT CONTRAST TECHNIQUE: Multiplanar, multiecho pulse sequences of the brain and surrounding structures were obtained without intravenous contrast. Angiographic images of the head were obtained using MRA technique without contrast. COMPARISON:  1. Brain MRI 10/11/2016 2. CTA head neck 10/11/2016 FINDINGS: MRI HEAD FINDINGS Brain: No focal diffusion restriction to indicate acute infarct. No intraparenchymal hemorrhage. There is T2 shine through at the site of the previously diagnosed right pontine infarct. The brain parenchymal signal is otherwise normal. No mass lesion or midline shift. No hydrocephalus or extra-axial fluid collection. The midline structures are normal. No age advanced or lobar predominant atrophy. Vascular: Major intracranial arterial and venous sinus flow voids are preserved. No evidence of chronic microhemorrhage or amyloid angiopathy. Skull and upper cervical spine: The visualized skull base, calvarium, upper cervical spine and extracranial soft tissues are normal. Sinuses/Orbits: No fluid levels or advanced mucosal thickening. No mastoid effusion. Normal orbits. MRA HEAD FINDINGS Intracranial internal carotid arteries: Normal. Anterior cerebral arteries: Absent left A1 segment is a normal congenital variant. Otherwise normal.  Middle cerebral arteries: Normal. Posterior communicating arteries: Present on the left. Posterior cerebral arteries: Normal. Basilar artery: There is severe narrowing of the midportion of the basilar artery, unchanged compared to the CTA of 10/11/2016, allowing for modality differences. Vertebral arteries: Codominant. Normal. Superior cerebellar arteries: Normal. Anterior inferior cerebellar arteries: Normal. Posterior inferior cerebellar  arteries: Normal. IMPRESSION: 1. No acute ischemia. 2. Normal evolution of diffusion-weighted changes at the site recent right pontine infarct. 3. 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 Electronically Signed   By: Ulyses Jarred M.D.   On: 11/29/2016 23:32   Ir Angio Intra Extracran Sel Com Carotid Innominate Bilat Mod Sed  Result Date: 12/01/2016 INDICATION: Vertebrobasilar ischemic symptoms. EXAM: BILATERAL COMMON CAROTID AND INNOMINATE ANGIOGRAPHY AND BILATERAL VERTEBRAL ARTERY ANGIOGRAMS MEDICATIONS: Heparin 1000 units IV. No antibiotic was administered within 1 hour of the procedure. ANESTHESIA/SEDATION: Versed 0 mg IV; Fentanyl 25 mcg IV Moderate Sedation Time:  30 minutes. The patient was continuously monitored during the procedure by the interventional radiology nurse under my direct supervision. FLUOROSCOPY TIME:  Fluoroscopy Time: 10 minutes 54 seconds (802 mGy). COMPLICATIONS: None immediate. TECHNIQUE: Informed written consent was obtained from the patient after a thorough discussion of the procedural risks, benefits and alternatives. All questions were addressed. Maximal Sterile Barrier Technique was utilized including caps, mask, sterile gowns, sterile gloves, sterile drape, hand hygiene and skin antiseptic. A timeout was performed prior to the initiation of the procedure. PROCEDURE: The right groin was prepped and draped in the usual sterile fashion. Thereafter using modified Seldinger technique, transfemoral access into the right common femoral artery was obtained without difficulty. Over a 0.035 inch guidewire, a 5 French Pinnacle sheath was inserted. Through this, and also over 0.035 inch guidewire, a 5 French JB1 catheter was advanced to the aortic arch region and selectively positioned in the right common carotid artery, the right subclavian artery, the left common carotid artery and the left vertebral artery. FINDINGS: The right common carotid artery origin is the  first branch from the aortic arch. The vessel is seen to opacify to its bifurcation. The right common carotid bifurcation demonstrates approximately 60% stenosis of the right external carotid artery at its origin. Its branches are normally opacified. The right internal carotid artery at the bulb to the cranial skull base is seen to opacify widely. The petrous portion is widely patent. There is mild stenosis at the petrous cavernous junction. The distal cavernous and the supraclinoid segments are widely patent. The right middle cerebral artery and the right anterior cerebral artery opacify into the capillary and venous phases. Prompt opacification via the anterior communicating artery of the left anterior cerebral artery A2 segment is noted. The left vertebral artery origin demonstrates mild stenosis at its origin. The vessel, otherwise, is seen to opacify widely to the cranial skull base. Wide patency is seen of the left vertebrobasilar junction. The left posterior-inferior cerebellar artery/anterior-inferior cerebellar complex is seen. The vertebrobasilar junction demonstrates a tortuous course into the basilar artery. At the level of the mid basilar artery, there is approximately 65-70% stenoses best appreciated on the AP projection. Distal to this there is wide patency of the posterior cerebral arteries, the superior cerebellar arteries and also of the right anterior-inferior cerebellar artery into the capillary and venous phases. Non-opacified blood is seen in the basilar artery proximally from the contralateral vertebral artery. The right subclavian artery has an aberrant origin a developmental variation. The origin of the right vertebral artery appears widely patent. The vessel is  seen to opacify widely to the cranial skull base. There is a moderate tortuosity in the mid cervical portion of the vertebral artery. Patency is seen of the right posterior-inferior cerebellar artery and the right vertebrobasilar  junction. Again demonstrated is an approximate 65-70% stenosis of the mid basilar artery. Distal to this wide patency is noted of the posterior cerebral arteries, superior cerebellar arteries and the anterior-inferior cerebellar arteries into the capillary and venous phases. Non-opacified blood is seen in the basilar artery from the contralateral vertebral artery. The left common carotid arteriogram demonstrates the left external carotid artery and its major branches to be widely patent. The left internal carotid artery at the bulb to the cranial skull base opacifies normally. The petrous segment is widely patent. There is mild diffuse narrowing of the proximal cavernous segment. The distal cavernous segment and the supraclinoid segments are widely patent. The left middle cerebral artery opacifies normally into the capillary and venous phases. There is no angiographic opacification of the left anterior cerebral artery. 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 Nonvisualization of the left anterior cerebral A1 segment may be related to either developmental aplasia versus pathologic occlusion secondary to intracranial arteriosclerosis. Electronically Signed   By: Luanne Bras M.D.   On: 11/30/2016 18:28   Ir Angio Vertebral Sel Vertebral Uni L Mod Sed  Result Date: 12/01/2016 INDICATION: Vertebrobasilar ischemic symptoms. EXAM: BILATERAL COMMON CAROTID AND INNOMINATE ANGIOGRAPHY AND BILATERAL VERTEBRAL ARTERY ANGIOGRAMS MEDICATIONS: Heparin 1000 units IV. No antibiotic was administered within 1 hour of the procedure. ANESTHESIA/SEDATION: Versed 0 mg IV; Fentanyl 25 mcg IV Moderate Sedation Time:  30 minutes. The patient was continuously monitored during the procedure by the interventional radiology nurse under my direct supervision. FLUOROSCOPY TIME:  Fluoroscopy Time: 10 minutes 54 seconds (802 mGy).  COMPLICATIONS: None immediate. TECHNIQUE: Informed written consent was obtained from the patient after a thorough discussion of the procedural risks, benefits and alternatives. All questions were addressed. Maximal Sterile Barrier Technique was utilized including caps, mask, sterile gowns, sterile gloves, sterile drape, hand hygiene and skin antiseptic. A timeout was performed prior to the initiation of the procedure. PROCEDURE: The right groin was prepped and draped in the usual sterile fashion. Thereafter using modified Seldinger technique, transfemoral access into the right common femoral artery was obtained without difficulty. Over a 0.035 inch guidewire, a 5 French Pinnacle sheath was inserted. Through this, and also over 0.035 inch guidewire, a 5 French JB1 catheter was advanced to the aortic arch region and selectively positioned in the right common carotid artery, the right subclavian artery, the left common carotid artery and the left vertebral artery. FINDINGS: The right common carotid artery origin is the first branch from the aortic arch. The vessel is seen to opacify to its bifurcation. The right common carotid bifurcation demonstrates approximately 60% stenosis of the right external carotid artery at its origin. Its branches are normally opacified. The right internal carotid artery at the bulb to the cranial skull base is seen to opacify widely. The petrous portion is widely patent. There is mild stenosis at the petrous cavernous junction. The distal cavernous and the supraclinoid segments are widely patent. The right middle cerebral artery and the right anterior cerebral artery opacify into the capillary and venous phases. Prompt opacification via the anterior communicating artery of the left anterior cerebral artery A2 segment is noted. The left vertebral artery origin demonstrates mild stenosis at its origin.  The vessel, otherwise, is seen to opacify widely to the cranial skull base. Wide patency is  seen of the left vertebrobasilar junction. The left posterior-inferior cerebellar artery/anterior-inferior cerebellar complex is seen. The vertebrobasilar junction demonstrates a tortuous course into the basilar artery. At the level of the mid basilar artery, there is approximately 65-70% stenoses best appreciated on the AP projection. Distal to this there is wide patency of the posterior cerebral arteries, the superior cerebellar arteries and also of the right anterior-inferior cerebellar artery into the capillary and venous phases. Non-opacified blood is seen in the basilar artery proximally from the contralateral vertebral artery. The right subclavian artery has an aberrant origin a developmental variation. The origin of the right vertebral artery appears widely patent. The vessel is seen to opacify widely to the cranial skull base. There is a moderate tortuosity in the mid cervical portion of the vertebral artery. Patency is seen of the right posterior-inferior cerebellar artery and the right vertebrobasilar junction. Again demonstrated is an approximate 65-70% stenosis of the mid basilar artery. Distal to this wide patency is noted of the posterior cerebral arteries, superior cerebellar arteries and the anterior-inferior cerebellar arteries into the capillary and venous phases. Non-opacified blood is seen in the basilar artery from the contralateral vertebral artery. The left common carotid arteriogram demonstrates the left external carotid artery and its major branches to be widely patent. The left internal carotid artery at the bulb to the cranial skull base opacifies normally. The petrous segment is widely patent. There is mild diffuse narrowing of the proximal cavernous segment. The distal cavernous segment and the supraclinoid segments are widely patent. The left middle cerebral artery opacifies normally into the capillary and venous phases. There is no angiographic opacification of the left anterior  cerebral artery. 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 Nonvisualization of the left anterior cerebral A1 segment may be related to either developmental aplasia versus pathologic occlusion secondary to intracranial arteriosclerosis. Electronically Signed   By: Luanne Bras M.D.   On: 11/30/2016 18:28     Echocardiogram 1/30 EF 60-65%, no source of embolus  Carotid doppler 1/30: No significant stenosis  Cerebral arteriogram by Dr. Estanislado Pandy 3/20: 67% stenosis of midbasilar artery.   Subjective: Patient feels well, no new deficits, wants to go home. No bleeding. No chest pain, dyspnea, palpitations, leg swelling.   Discharge Exam: Vitals:   12/01/16 0300 12/01/16 0853  BP: 110/74 108/68  Pulse: 85   Resp: 16   Temp: 98.6 F (37 C)    General: Pt is alert, awake, not in acute distress Cardiovascular: RRR, S1/S2 +, no rubs, no gallops Respiratory: CTA bilaterally, no wheezing, no rhonchi Abdominal: Soft, NT, ND, bowel sounds + Extremities: No edema, no cyanosis Neuro: No acute abnormalities. Alert and oriented.   The results of significant diagnostics from this hospitalization (including imaging, microbiology, ancillary and laboratory) are listed below for reference.    Labs: Basic Metabolic Panel:  Recent Labs Lab 11/29/16 1918 11/29/16 1934  NA 142 144  K 3.6 3.8  CL 111 107  CO2 25  --   GLUCOSE 97 97  BUN 19 23*  CREATININE 0.88 0.80  CALCIUM 10.4*  --    Liver Function Tests:  Recent Labs Lab 11/29/16 1918  AST 29  ALT 36  ALKPHOS 86  BILITOT 0.4  PROT 7.2  ALBUMIN 3.9   CBC:  Recent Labs Lab 11/29/16 1918 11/29/16  1934 11/30/16 0358 12/01/16 0554  WBC 6.9  --  7.5 7.2  NEUTROABS 3.7  --   --   --   HGB 12.1 12.6 11.4* 11.7*  HCT 37.2 37.0 36.0 36.9  MCV 94.2  --  93.5 94.1  PLT 212  --  210 204   Urinalysis    Component Value  Date/Time   COLORURINE STRAW (A) 11/30/2016 0402   APPEARANCEUR CLEAR 11/30/2016 0402   LABSPEC 1.005 11/30/2016 0402   PHURINE 7.0 11/30/2016 0402   GLUCOSEU NEGATIVE 11/30/2016 0402   HGBUR NEGATIVE 11/30/2016 0402   BILIRUBINUR NEGATIVE 11/30/2016 0402   KETONESUR NEGATIVE 11/30/2016 0402   PROTEINUR NEGATIVE 11/30/2016 0402   UROBILINOGEN 0.2 07/17/2015 1334   NITRITE NEGATIVE 11/30/2016 0402   LEUKOCYTESUR TRACE (A) 11/30/2016 0402   Time coordinating discharge: Approximately 40 minutes  Vance Gather, MD  [facility] 12/01/2016, 1:03 PM Pager [phone]

## 2016-12-02 ENCOUNTER — Encounter: Payer: Medicare Other | Admitting: Occupational Therapy

## 2016-12-02 ENCOUNTER — Ambulatory Visit: Payer: Medicare Other | Admitting: Physical Therapy

## 2016-12-03 ENCOUNTER — Inpatient Hospital Stay: Payer: Medicare Other | Admitting: Physical Medicine & Rehabilitation

## 2016-12-06 ENCOUNTER — Ambulatory Visit (INDEPENDENT_AMBULATORY_CARE_PROVIDER_SITE_OTHER): Payer: Medicare Other | Admitting: Neurology

## 2016-12-06 ENCOUNTER — Encounter: Payer: Self-pay | Admitting: Neurology

## 2016-12-06 VITALS — BP 116/71 | HR 99 | Ht 64.0 in | Wt 126.0 lb

## 2016-12-06 DIAGNOSIS — F419 Anxiety disorder, unspecified: Secondary | ICD-10-CM | POA: Diagnosis not present

## 2016-12-06 DIAGNOSIS — I6302 Cerebral infarction due to thrombosis of basilar artery: Secondary | ICD-10-CM

## 2016-12-06 NOTE — Progress Notes (Signed)
PATIENT: Mercedes Gardner DOB: 1945/12/14  Chief Complaint  Patient presents with  . Cerebrovascular Accident    She is here with her son, Mercedes Gardner, to follow up from her CVA in February 2018 and TIA in March 2018.  She has completed PT and no longer needs a walker.  She is now using a cane for assistance.  She feels her weakness has improved.  [name] Kitchen PCP    Nolene Ebbs, MD - referred from hospital     [name] is a 71 years old right-handed female, accompanied by her son Mercedes Gardner, seen in refer by her primary care physician Dr. Nolene Ebbs for follow-up of stroke. Initial evaluation was on December 06 2016.  She had a history of anxiety, gout, hypertension, hyperlipidemia, currently taking aspirin and Plavix, she used to smoke, quit since January 2018.  I reviewed and summarized her multiple hospital admission record, She presented with sudden onset slurred speech, left-sided weakness on October 09 2016, I personally reviewed MRI of the brain: Showed acute stroke in the right pons, and multiple additional small foci on the right cerebellum hemisphere, right occipital lobe, right paramedian parietal lobe. Elevated WBC.  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 Lower extremity Doppler study was negative, ultrasound of carotid artery showed no significant stenosis. There was incidental findings of pulmonary nodule, she should have repeat CT chest in 6-12 months.  She was placed on Coumadin upon discharge for stroke/DVT prophylaxis, Lovenox for bridging, the recommendation was to repeat a CT angiogram of head and neck in 3 months to decide the duration of anticoagulation use. She was also treated with Augmentin for sinusitis. She will discharged to rehabilitation was discharged on February eighth 2018.  Patient suffered significant anxiety during the process, has panic attacks, frequent headaches, her BuSpar dose was increased to 7.5 milligrams  twice a day,  There was some dysregulation of her Coumadin dose, she presented to the emergency room on November 29 2016 again, for transient slurred speech, INR was found to be subtherapeutic 1.26, CT showed no acute abnormality. She had four-vessel angiogram November 30 2016: Approximately 65-70% stenosis of mid basilar artery, nonvisualization of the left anterior cerebral A1 segment, aberrant origin of the right subclavian artery, development anomaly.  MRI of the brain showed no new stroke.  She now lives with her son at home, no focal deficit noticed. But suffered frequent anxiety, she was given Topamax 25 mg every night as headache prevention, causing worsening depression, she has stopped taking it. Her headache overall has improved.   I reviewed laboratory on December 01 2016, INR 1.57, hemoglobin 11 point 7, UDS was negative, CMP showed creatinine 0.8, alcohol less than 5, troponin negative, LDL 129, cholesterol 190.  REVIEW OF SYSTEMS: Full 14 system review of systems performed and notable only for anxiety, not enough sleep, decreased energy, change in appetite, sleepiness, weight loss, blurred vision, shortness of breath, joint pain, allergy ALLERGIES: Allergies  Allergen Reactions  . Tramadol Other (See Comments)    Depression, moody, increased back pain  . Topiramate     "mood swings"    HOME MEDICATIONS: Current Outpatient Prescriptions  Medication Sig Dispense Refill  . acetaminophen-codeine (TYLENOL #3) 300-30 MG tablet Take 1 tablet by mouth daily as needed (pain).     [name] Kitchen amLODipine (NORVASC) 10 MG tablet Take 10 mg by mouth daily.    [name] Kitchen aspirin EC 325 MG EC tablet Take 1 tablet (  325 mg total) by mouth daily. 30 tablet 0  . busPIRone (BUSPAR) 7.5 MG tablet Take 1 tablet (7.5 mg total) by mouth 2 (two) times daily. (Patient taking differently: Take 7.5 mg by mouth See admin instructions. Take 1 tablet (7.5 mg) by mouth twice daily, may also take 1 tablet (7.5 mg) during the day as needed  for anxiety) 60 tablet 0  . clopidogrel (PLAVIX) 75 MG tablet Take 1 tablet (75 mg total) by mouth daily. 30 tablet 0  . colchicine 0.6 MG tablet Take 1 tablet (0.6 mg total) by mouth 2 (two) times daily. (Patient taking differently: Take 0.6 mg by mouth 2 (two) times daily. Colcrys) 60 tablet 0  . diclofenac sodium (VOLTAREN) 1 % GEL Apply 4 g topically 4 (four) times daily. To both knees, please instruct in dosing. (Patient taking differently: Apply 1 application topically 2 (two) times daily as needed (knee pain). ) 3 Tube 0  . ferrous sulfate 325 (65 FE) MG tablet Take 1 tablet (325 mg total) by mouth 2 (two) times daily with a meal. 60 tablet 0  . fluticasone (FLONASE) 50 MCG/ACT nasal spray Place 2 sprays into both nostrils as needed.     . folic acid (FOLVITE) 1 MG tablet Take 1 tablet (1 mg total) by mouth daily. 30 tablet 0  . Multiple Vitamin (MULTIVITAMIN WITH MINERALS) TABS tablet Take 1 tablet by mouth daily.    . rosuvastatin (CRESTOR) 20 MG tablet Take 20 mg by mouth daily.    . Tetrahydroz-Dextran-PEG-Povid 0.05-0.1-1-1 % SOLN Place 1 drop into both eyes daily as needed (dry eyes). Generic OTC eye drops     No current facility-administered medications for this visit.     PAST MEDICAL HISTORY: Past Medical History:  Diagnosis Date  . Anemia   . Anxiety   . Arthritis    knees   . Dysrhythmia    heart skips a beat   . GERD (gastroesophageal reflux disease)   . Gout   . Hyperlipidemia April 2014  . Hypertension   . Shortness of breath dyspnea    due to pressure of cyst per patient   . Stroke ([place])   . TIA (transient ischemic attack)     PAST SURGICAL HISTORY: Past Surgical History:  Procedure Laterality Date  . CESAREAN SECTION     x3  . IR GENERIC HISTORICAL  11/30/2016   IR ANGIO VERTEBRAL SEL VERTEBRAL UNI L MOD SED 11/30/2016 Luanne Bras, MD [facility]  . IR GENERIC HISTORICAL  11/30/2016   IR ANGIO INTRA EXTRACRAN SEL COM CAROTID INNOMINATE BILAT MOD  SED 11/30/2016 Luanne Bras, MD [facility]  . OVARIAN CYST SURGERY Right 1980's  . SALPINGOOPHORECTOMY Bilateral 02/06/2015   Procedure: Campbell Stall LAPAROTOMY/BILATERAL SALPINGO OOPHORECTOMY;  Surgeon: Everitt Amber, MD;  Location: [facility];  Service: Gynecology;  Laterality: Bilateral;    FAMILY HISTORY: Family History  Problem Relation Age of Onset  . Hypertension Mother   . Diabetes Mother   . Breast cancer Mother   . Diabetes Sister   . Hypertension Sister   . Other Father     accident    SOCIAL HISTORY:  Social History   Social History  . Marital status: Divorced    Spouse name: N/A  . Number of children: 3  . Years of education: some college   Occupational History  . Retired    Social History Main Topics  . Smoking status: Current Some Day Smoker    Packs/day: 0.02  Types: Cigarettes  . Smokeless tobacco: Never Used     Comment: Quit 10/2016  . Alcohol use No     Comment: Quit 10/2016  . Drug use: No  . Sexual activity: No   Other Topics Concern  . Not on file   Social History Narrative   Lives at home with son and grandson.   Right-handed.   Occasional caffeine use.     PHYSICAL EXAM   Vitals:   12/06/16 1002  BP: 116/71  Pulse: 99  Weight: 126 lb (57.2 kg)  Height: 5\' 4"  (1.626 m)    Not recorded      Body mass index is 21.63 kg/m.  PHYSICAL EXAMNIATION:  Gen: NAD, conversant, well nourised, obese, well groomed                     Cardiovascular: Regular rate rhythm, no peripheral edema, warm, nontender. Eyes: Conjunctivae clear without exudates or hemorrhage Neck: Supple, no carotid bruits. Pulmonary: Clear to auscultation bilaterally   NEUROLOGICAL EXAM:  MENTAL STATUS: Speech:    Speech is normal; fluent and spontaneous with normal comprehension.  Cognition:     Orientation to time, place and person     Normal recent and remote memory     Normal Attention span and concentration     Normal Language, naming,  repeating,spontaneous speech     Fund of knowledge   CRANIAL NERVES: CN II: Visual fields are full to confrontation. Fundoscopic exam is normal with sharp discs and no vascular changes. Pupils are round equal and briskly reactive to light. CN III, IV, VI: extraocular movement are normal. No ptosis. CN V: Facial sensation is intact to pinprick in all 3 divisions bilaterally. Corneal responses are intact.  CN VII: Face is symmetric with normal eye closure and smile. CN VIII: Hearing is normal to rubbing fingers CN IX, X: Palate elevates symmetrically. Phonation is normal. CN XI: Head turning and shoulder shrug are intact CN XII: Tongue is midline with normal movements and no atrophy.  MOTOR: There is no pronator drift of out-stretched arms. Muscle bulk and tone are normal. Muscle strength is normal.  REFLEXES: Reflexes are 2+ and symmetric at the biceps, triceps, knees, and ankles. Plantar responses are flexor.  SENSORY: Intact to light touch, pinprick, positional sensation and vibratory sensation are intact in fingers and toes.  COORDINATION: Rapid alternating movements and fine finger movements are intact. There is no dysmetria on finger-to-nose and heel-knee-shin.    GAIT/STANCE: Posture is normal. Gait is steady with normal steps, base, arm swing, and turning. Heel and toe walking are normal. Tandem gait is normal.  Romberg is absent.   DIAGNOSTIC DATA (LABS, IMAGING, TESTING) - I reviewed patient records, labs, notes, testing and imaging myself where available.   ASSESSMENT AND PLAN  ALLYSSA ABRUZZESE is a 71 y.o. female   Posterior circulation stroke, due to significant stenosis of mid basilar artery stenosis.  Vascular risk factor of hypertension, hyperlipidemia, previously smoked,  Keep aspirin, Plavix,  Keep well hydration, moderate exercise,  Continue to address vascular risk factor, blood pressure less than 130/80, LDL less than 100.   Marcial Pacas, M.D.  Ph.D.  [facility] [address], [place], [place] [address] Ph: [phone] Fax: [phone]  CC: Nolene Ebbs, MD

## 2016-12-07 ENCOUNTER — Telehealth: Payer: Self-pay

## 2016-12-07 ENCOUNTER — Ambulatory Visit: Payer: Medicare Other | Admitting: Physical Therapy

## 2016-12-07 ENCOUNTER — Ambulatory Visit (INDEPENDENT_AMBULATORY_CARE_PROVIDER_SITE_OTHER): Payer: Medicare Other | Admitting: Internal Medicine

## 2016-12-07 ENCOUNTER — Encounter: Payer: Self-pay | Admitting: Internal Medicine

## 2016-12-07 ENCOUNTER — Encounter: Payer: Medicare Other | Admitting: Occupational Therapy

## 2016-12-07 ENCOUNTER — Encounter: Payer: Medicare Other | Admitting: Speech Pathology

## 2016-12-07 VITALS — BP 114/68 | HR 86 | Temp 98.2°F | Ht 64.0 in | Wt 127.5 lb

## 2016-12-07 DIAGNOSIS — F17211 Nicotine dependence, cigarettes, in remission: Secondary | ICD-10-CM | POA: Diagnosis not present

## 2016-12-07 DIAGNOSIS — Z7982 Long term (current) use of aspirin: Secondary | ICD-10-CM

## 2016-12-07 DIAGNOSIS — Z79899 Other long term (current) drug therapy: Secondary | ICD-10-CM | POA: Diagnosis not present

## 2016-12-07 DIAGNOSIS — Z9114 Patient's other noncompliance with medication regimen: Secondary | ICD-10-CM

## 2016-12-07 DIAGNOSIS — R911 Solitary pulmonary nodule: Secondary | ICD-10-CM | POA: Diagnosis not present

## 2016-12-07 DIAGNOSIS — F32A Depression, unspecified: Secondary | ICD-10-CM | POA: Insufficient documentation

## 2016-12-07 DIAGNOSIS — Z8673 Personal history of transient ischemic attack (TIA), and cerebral infarction without residual deficits: Secondary | ICD-10-CM | POA: Diagnosis not present

## 2016-12-07 DIAGNOSIS — I10 Essential (primary) hypertension: Secondary | ICD-10-CM

## 2016-12-07 DIAGNOSIS — F329 Major depressive disorder, single episode, unspecified: Secondary | ICD-10-CM | POA: Diagnosis not present

## 2016-12-07 DIAGNOSIS — Z09 Encounter for follow-up examination after completed treatment for conditions other than malignant neoplasm: Secondary | ICD-10-CM | POA: Diagnosis present

## 2016-12-07 DIAGNOSIS — D509 Iron deficiency anemia, unspecified: Secondary | ICD-10-CM | POA: Diagnosis not present

## 2016-12-07 DIAGNOSIS — F331 Major depressive disorder, recurrent, moderate: Secondary | ICD-10-CM

## 2016-12-07 DIAGNOSIS — I6302 Cerebral infarction due to thrombosis of basilar artery: Secondary | ICD-10-CM

## 2016-12-07 DIAGNOSIS — Z0000 Encounter for general adult medical examination without abnormal findings: Secondary | ICD-10-CM | POA: Insufficient documentation

## 2016-12-07 MED ORDER — DICLOFENAC SODIUM 1 % TD GEL: 4.0000 g | Freq: Four times a day (QID) | TRANSDERMAL | 0 refills | Status: DC

## 2016-12-07 NOTE — Telephone Encounter (Signed)
Pt wants to know when she can get injections in her knee, please advise, she was speaking about blood thinners? She states she really needs injections

## 2016-12-07 NOTE — Assessment & Plan Note (Signed)
RLL 8 mm ground glass nodule noted incidentally on CT chest. Recommend repeat CT in 6-12 months. Patient is a former smoker. She reports smoking a third a pack of cigarettes daily since her 58s. Quit smoking in January 2018. Discussed findings with patient today and recommendations to repeat imaging for follow-up. -- Repeat CT chest 6-12 months

## 2016-12-07 NOTE — Assessment & Plan Note (Signed)
Well-controlled today, 114/68. - continue amlodipine 10 daily

## 2016-12-07 NOTE — Assessment & Plan Note (Signed)
Patient reports depressed mood which has been ongoing for the past year. She reports that her stepfather was an in a severe motor vehicle accident and still recovering. She also cites her recent stroke, hospitalization, and recently ended relationship with her significant other as contributing to her depression. She endorses associated poor appetite, weight loss, anhedonia, and anxiety. She denies suicidal ideation. She was started on BuSpar about one year ago and is currently prescribed 7.5 mg twice a day. However, patient reports taking this medicine inconsistently on a PRN basis. I discussed with the patient that this medicine is not ideal for treating depression. I recommended switching to an SSRI, however she is very resistant to this idea. She is suspicious of new medications and does not like the idea of taking daily medications. Patiently stopped taking her topiramate two days ago as she believes this was affecting her mood. This was discussed with her neurologist yesterday. Patient would like to start taking BuSpar regularly as prescribed and wait to see if coming off topiramate helps improve her mood. She has agreed to follow up in 4 weeks. If her mood has not improved, she will consider starting citalopram. -- Buspar 7.5 BID -- Follow up 4 weeks

## 2016-12-07 NOTE — Progress Notes (Signed)
Internal Medicine Clinic Attending  Case discussed with Dr. Guilloud at the time of the visit.  We reviewed the resident's history and exam and pertinent patient test results.  I agree with the assessment, diagnosis, and plan of care documented in the resident's note.  

## 2016-12-07 NOTE — Progress Notes (Signed)
   CC: Hospital follow up   HPI:  [name] is a 71 y.o. female with past medical history outlined below here for hospital follow up. Patient initially presented with sudden onset slurred speech and left sided weakness on January 27th, 2018. MRI brain revealaed acute stroke in the right pons as well as multiple additional small foci on the right cerebellar hemisphere, right occipital lobe, an right paramedian parietal lobe. CTA head and neck showed moderate to severe mid basliar stenosis. There was concern for embolic source and she was started on warfarin at discharge with lovenox bridge. Plan was to repeat CTA head and neck in 3 months to determine duration of anticoagulation. Unfortunately, patient presented March 20th to the ED again with similar symptoms of slurred speech and her INR was subtherapeutic 1.26. CTA head was negative for acute abnormality. Cerebral angiogram performed March 20th 2018 revealed a 65-70% stenosis of the mid basilar artery. MRI was negative for acute CVA. Coumadin was discontinued per neurology recommendations and she was discharged on ASA 325 and plavix 75 daily.   For the details of today's visit, please refer to the assessment and plan.  Past Medical History:  Diagnosis Date  . Anemia   . Anxiety   . Arthritis    knees   . Dysrhythmia    heart skips a beat   . GERD (gastroesophageal reflux disease)   . Gout   . Hyperlipidemia April 2014  . Hypertension   . Shortness of breath dyspnea    due to pressure of cyst per patient   . Stroke ([place])   . TIA (transient ischemic attack)     Review of Systems:  All pertinents listed in HPI, otherwise negative  Physical Exam:  Vitals:   12/07/16 0848  BP: 114/68  Pulse: 86  Temp: 98.2 F (36.8 C)  TempSrc: Oral  SpO2: 100%  Weight: 127 lb 8 oz (57.8 kg)  Height: 5\' 4"  (1.626 m)    Constitutional: NAD, appears comfortable HEENT: Atraumatic, normocephalic. PERRL, anicteric sclera.  Neck: Supple,  trachea midline.  Cardiovascular: RRR, no murmurs, rubs, or gallops.  Pulmonary/Chest: CTAB, no wheezes, rales, or rhonchi. No chest wall abnormalities.  Abdominal: Soft, non tender, non distended. +BS.  Extremities: Warm and well perfused. No edema.  Neurological: A&Ox3, CN II - XII grossly intact. Normal speech. Strength and sensation intact and symmetric bilaterally.  Psychiatric: Normal mood and affect  Assessment & Plan:   See Encounters Tab for problem based charting.  Patient discussed with Dr. Lynnae January

## 2016-12-07 NOTE — Assessment & Plan Note (Addendum)
Patient reports unintentional weight loss of about 30 pounds over the past year. She has started drinking ensure shakes to supplement her meals in attempt to gain weight. Patient is a former chronic smoker. Quit smoking in January 2018. CTA chest performed 10-12-16 was negative for findings to explain her weight loss, but a ground-glass nodule in the right lower lobe measuring 8 mm was noted. Recommend repeat CT chest 6-12 months. Patient does have a history of iron deficiency anemia for which she is on iron supplements daily. She denies that she has ever had a colonoscopy. I will refer her to gastroenterology today for screening. Patient is overdue for a PAP smear. Instructed patient to schedule a follow up appointment to have this done. Of note, patient believes her depression is contributing to her weight loss. Please see depression A/P. She is currently prescribed Buspar 7.5 mg BID but has been taking inconsistently on a PRN basis. Encouraged patient to take this medicine as prescribed. Follow up 4 weeks.  -- GI referral for colonoscopy  -- F/u appointment for PAP smear  -- Repeat CTA chest 6-12 months  -- Encouraged good nutrition, ensure supplements  -- Follow up 4 weeks

## 2016-12-07 NOTE — Patient Instructions (Addendum)
Mercedes Gardner,  It was a pleasure to see you today. Please continue to take your medicines as previously prescribed. For now, please take your buspar twice daily as prescribed. I would like you to follow up in 4 weeks for your depression/anxiety and to have a PAP smear done. I have also referred you to have a colonoscopy done. You will be called for an appointment. If you have any questions or concerns, call our clinic at [phone] or after hours call [phone] and ask for the internal medicine resident on call. Thank you!  - Dr. Philipp Ovens

## 2016-12-07 NOTE — Assessment & Plan Note (Signed)
Patient reports compliance with her ASA 325 and Plavix 75 daily. She is also on Crestor 20 mg for secondary prevention. She follows with neurology, last office visit was yesterday. She has no residual deficits from her stroke. She is doing well and still participating in outpatient PT which she finds helpful. Patient reports she quit smoking in January. A1C checked in January was 5.4.  -- Continue ASA 325, Plavix 75, and Crestor 20 mg -- F/u neurology

## 2016-12-07 NOTE — Telephone Encounter (Signed)
Needs to speak with a nurse about knee injection.

## 2016-12-08 NOTE — Telephone Encounter (Signed)
Please ask the patient when her last knee injection was. The earliest she can have injections is every 3 months. She will need to make another appointment. Thanks!

## 2016-12-08 NOTE — Telephone Encounter (Signed)
I called dr blackman's office and they state her last bil knee injections were 12/30/2015 Will send to [facility] to schedule appt for injections and to change pcp

## 2016-12-09 ENCOUNTER — Ambulatory Visit: Payer: Medicare Other | Admitting: Occupational Therapy

## 2016-12-09 ENCOUNTER — Ambulatory Visit: Payer: Medicare Other | Admitting: Physical Therapy

## 2016-12-09 ENCOUNTER — Encounter: Payer: Medicare Other | Admitting: Speech Pathology

## 2016-12-09 NOTE — Therapy (Signed)
[facility] [address] [place], [place], [address] Phone: [phone]   Fax:  [phone]  Physical Therapy Evaluation  Patient Details  Name: Mercedes Gardner MRN: [id number] Date of Birth: 08-05-1946 Referring Provider: Dr. Alysia Penna  Encounter Date: 11/16/2016    Past Medical History:  Diagnosis Date  . Anemia   . Anxiety   . Arthritis    knees   . Dysrhythmia    heart skips a beat   . GERD (gastroesophageal reflux disease)   . Gout   . Hyperlipidemia April 2014  . Hypertension   . Shortness of breath dyspnea    due to pressure of cyst per patient   . Stroke ([place])   . TIA (transient ischemic attack)     Past Surgical History:  Procedure Laterality Date  . CESAREAN SECTION     x3  . IR GENERIC HISTORICAL  11/30/2016   IR ANGIO VERTEBRAL SEL VERTEBRAL UNI L MOD SED 11/30/2016 Luanne Bras, MD [facility]  . IR GENERIC HISTORICAL  11/30/2016   IR ANGIO INTRA EXTRACRAN SEL COM CAROTID INNOMINATE BILAT MOD SED 11/30/2016 Luanne Bras, MD [facility]  . OVARIAN CYST SURGERY Right 1980's  . SALPINGOOPHORECTOMY Bilateral 02/06/2015   Procedure: Campbell Stall LAPAROTOMY/BILATERAL SALPINGO OOPHORECTOMY;  Surgeon: Everitt Amber, MD;  Location: [facility];  Service: Gynecology;  Laterality: Bilateral;    There were no vitals filed for this visit.                                   PT Long Term Goals - 12/09/16 1249      PT LONG TERM GOAL #1   Title Independent in HEP for ankle strengthening.  12-17-16   Time 2   Period Weeks   Status New     PT LONG TERM GOAL #2   Title Increase gait velocity to >/= 3.8 ft/sec with no device.  12-17-16   Time 2   Period Weeks   Status New     PT LONG TERM GOAL #3   Title Increase DGI >/= 23/24 to reduce fall risk.  12-17-16   Baseline 21/24   Time 2   Period Weeks   Status New             Patient will benefit from skilled  therapeutic intervention in order to improve the following deficits and impairments:  Decreased balance, Abnormal gait, Decreased strength  Visit Diagnosis: Muscle weakness (generalized) - Plan: PT plan of care cert/re-cert  Other abnormalities of gait and mobility - Plan: PT plan of care cert/re-cert     Problem List Patient Active Problem List   Diagnosis Date Noted  . Healthcare maintenance 12/07/2016  . Depression 12/07/2016  . Protein-calorie malnutrition, severe 12/01/2016  . 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 Gait disturbance, post-stroke 10/14/2016  . Dysarthria   . Left-sided weakness   . Lung nodule   . Unintentional weight loss   . Cerebral infarction due to thrombosis of basilar artery ([place])   . FTT (failure to thrive) in adult 10/11/2016  . Leukocytosis 10/11/2016  . Acute hypokalemia 10/11/2016  . Normocytic anemia 10/11/2016  . Cerebral thrombosis with cerebral infarction 10/11/2016  . Acute CVA (cerebrovascular accident) ([place]) 10/11/2016  . 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 Ischemic stroke ([place])   . Anxiety   . Hypokalemia   . Pelvic mass  in female 12/26/2014  . Gout 12/24/2008  . ALLERGIC RHINITIS 12/20/2008  . KNEE PAIN, LEFT 12/20/2008  . TOBACCO ABUSE 07/29/2006  . HTN (hypertension) 07/29/2006  . HEMORRHOIDS 07/29/2006    Alda Lea, PT 12/09/2016, 12:50 PM  [facility] [address] [place], [place], [address] Phone: [phone]   Fax:  [phone]  Name: VENESA SEMIDEY MRN: [id number] Date of Birth: 25-Feb-1946

## 2016-12-13 ENCOUNTER — Encounter (HOSPITAL_COMMUNITY): Payer: Self-pay | Admitting: Radiology

## 2016-12-13 ENCOUNTER — Telehealth: Payer: Self-pay

## 2016-12-13 NOTE — Telephone Encounter (Signed)
Patient called with concerns regarding yeast infection medication was told that she couldn't take medication because it would interact with other medication, states she is ok now because she has an appointment to come in on 11/15/2016 will discuss with MD then

## 2016-12-16 ENCOUNTER — Ambulatory Visit: Payer: Medicare Other

## 2016-12-23 ENCOUNTER — Encounter: Payer: Self-pay | Admitting: Internal Medicine

## 2016-12-23 ENCOUNTER — Encounter (INDEPENDENT_AMBULATORY_CARE_PROVIDER_SITE_OTHER): Payer: Self-pay

## 2016-12-23 ENCOUNTER — Encounter: Payer: Self-pay | Admitting: Occupational Therapy

## 2016-12-23 ENCOUNTER — Ambulatory Visit: Payer: Medicare Other | Admitting: Physical Therapy

## 2016-12-23 ENCOUNTER — Ambulatory Visit: Payer: Medicare Other | Attending: Physical Medicine & Rehabilitation | Admitting: Occupational Therapy

## 2016-12-23 ENCOUNTER — Ambulatory Visit (INDEPENDENT_AMBULATORY_CARE_PROVIDER_SITE_OTHER): Payer: Medicare Other | Admitting: Internal Medicine

## 2016-12-23 VITALS — BP 132/66 | HR 95 | Temp 98.4°F | Ht 65.0 in | Wt 127.9 lb

## 2016-12-23 DIAGNOSIS — M170 Bilateral primary osteoarthritis of knee: Secondary | ICD-10-CM | POA: Diagnosis present

## 2016-12-23 DIAGNOSIS — F17211 Nicotine dependence, cigarettes, in remission: Secondary | ICD-10-CM | POA: Diagnosis not present

## 2016-12-23 DIAGNOSIS — M25561 Pain in right knee: Secondary | ICD-10-CM

## 2016-12-23 DIAGNOSIS — Z7982 Long term (current) use of aspirin: Secondary | ICD-10-CM

## 2016-12-23 DIAGNOSIS — M25562 Pain in left knee: Secondary | ICD-10-CM

## 2016-12-23 DIAGNOSIS — Z7902 Long term (current) use of antithrombotics/antiplatelets: Secondary | ICD-10-CM

## 2016-12-23 DIAGNOSIS — Z8673 Personal history of transient ischemic attack (TIA), and cerebral infarction without residual deficits: Secondary | ICD-10-CM | POA: Diagnosis not present

## 2016-12-23 DIAGNOSIS — G8929 Other chronic pain: Secondary | ICD-10-CM

## 2016-12-23 MED ORDER — CLOPIDOGREL BISULFATE 75 MG PO TABS: 75.0000 mg | ORAL_TABLET | Freq: Every day | ORAL | 0 refills | Status: DC

## 2016-12-23 MED ORDER — ASPIRIN 325 MG PO TBEC: 325.0000 mg | DELAYED_RELEASE_TABLET | Freq: Every day | ORAL | 0 refills | Status: DC

## 2016-12-23 NOTE — Therapy (Signed)
[facility] [address] [place], [place], [address] Phone: [phone]   Fax:  [phone]  Patient Details  Name: Mercedes Gardner MRN: [id number] Date of Birth: Oct 14, 1945 Referring Provider:  Dr. Alysia Penna Encounter Date: 12/23/2016   Pt has not returned to occupational therapy after 2nd visit on 11/16/16. Pt was hospitalized on 11/29/16 with stroke like symptoms. Pt also has missed several therapy sessions. At this time, will d/c O.T. Therapist called pt today and left message with patient that she will need a new MD referral to return to outpatient therapy.  No goals were met due to only being seen for evaluation and 1 session  Carey Bullocks, OTR/L 12/23/2016, 11:29 AM  [facility] [address] [place], [place], [address] Phone: [phone]   Fax:  [phone]

## 2016-12-23 NOTE — Progress Notes (Signed)
   CC: Bilateral knee pain  HPI:  [name] is a 71 y.o. woman with past medical history as noted below who presents today for evaluation of her bilateral knee pain.  She has long-standing history of osteoarthritis in both of her knees. She describes having achy, constant pain, especially when standing for prolonged periods of time. She has received steroid injections in the past which have provided relief. She is interested in getting steroid injections today. Her last injections were over 6 months ago.  She is also requesting refills of her aspirin and plavix today. 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 She was previously on Coumadin but then switched to aspirin and plavix during her March 2018 hospitalization for recurrent stroke-like symptoms.  Past Medical History:  Diagnosis Date  . Anemia   . Anxiety   . Arthritis    knees   . Dysrhythmia    heart skips a beat   . GERD (gastroesophageal reflux disease)   . Gout   . Hyperlipidemia April 2014  . Hypertension   . Shortness of breath dyspnea    due to pressure of cyst per patient   . Stroke ([place])   . TIA (transient ischemic attack)     Review of Systems:   General: Denies fever, chills, night sweats, changes in weight, changes in appetite HEENT: Denies headaches, ear pain, changes in vision, rhinorrhea, sore throat CV: Denies CP, palpitations, SOB, orthopnea Pulm: Denies SOB, cough, wheezing GI: Denies abdominal pain, nausea, vomiting, diarrhea, constipation, melena, hematochezia GU: Denies dysuria, hematuria, frequency Msk: Denies muscle cramps Neuro: Denies weakness, numbness, tingling Skin: Denies rashes, bruising Psych: Denies anxiety, hallucinations  Physical Exam:  Vitals:   12/23/16 1323  BP: 132/66  Pulse: 95  Temp: 98.4 F (36.9 C)  TempSrc: Oral  SpO2: 100%  Weight: 127 lb 14.4 oz (58 kg)  Height: 5\' 5"  (1.651 m)   General: Thin elderly woman in  NAD Ext: Tenderness to palpation in grooves on both sides of the tibial tuberosity. No swelling or erythema of knees.   Procedure Note Procedure: Bilateral corticosteroid injection Location: Anterior knee, bilateral  Informed consent was obtained. Risks of procedure discussed with patient.  Patient was prepped and draped appropriately. Iodine was used to clean the area. Approximately 0.5 cc Kenalog-40 and 1.5 cc 2% lidocaine were drawn up for each knee injection. Injections performed in the right anterior knee (lateral side) and left anterior knee (medial side).   Patient tolerated procedure well and no complications occurred.  Dr. Heber Secor was present and supervised the entire procedure.  Assessment & Plan:   See Encounters Tab for problem based charting.  Patient seen with Dr. Angelia Mould

## 2016-12-23 NOTE — Patient Instructions (Signed)
General Instructions: - Refills made for aspirin and Plavix - Hopefully your knee pain will get better in the next 24 hours - Make an appointment with the front desk to see your PCP  Thank you for bringing your medicines today. This helps [place] keep you safe from mistakes.   Progress Toward Treatment Goals:  No flowsheet data found.  Self Care Goals & Plans:  No flowsheet data found.  No flowsheet data found.   Care Management & Community Referrals:  No flowsheet data found.

## 2016-12-24 NOTE — Assessment & Plan Note (Signed)
Patient received a steroid injection in each knee today (anterior approach). She tolerated the procedure well. She cannot receive another steroid injection for at least 3 months.

## 2016-12-24 NOTE — Assessment & Plan Note (Signed)
Refilled her aspirin and plavix today. She has no new neurological symptoms.

## 2016-12-27 ENCOUNTER — Telehealth: Payer: Self-pay | Admitting: Internal Medicine

## 2016-12-27 NOTE — Progress Notes (Signed)
Internal Medicine Clinic Attending  I saw and evaluated the patient.  I personally confirmed the key portions of the history and exam documented by Dr. Arcelia Jew and I reviewed pertinent patient test results.  The assessment, diagnosis, and plan were formulated together and I agree with the documentation in the resident's note. I was present for both procedures: however to correct the procedure note please see below:  PROCEDURE NOTE  PROCEDURE: right knee joint steroid injection.  PREOPERATIVE DIAGNOSIS: Osteoarthritis of the both knee.  POSTOPERATIVE DIAGNOSIS: Osteoarthritis of the both knee.  PROCEDURE: The patient was apprised of the risks and the benefits of the procedure and informed consent was obtained. Time-out procedure was performed, with confirmation of the patient's name, date of birth, and correct identification of the right knee to be injected. The patient's knee was then marked at the appropriate site for injection placement. The knee was sterilely prepped with Betadine. A 40 mg (1 milliliter) solution of Kenalog was drawn up into a 5 mL syringe with a 2 mL of 1% lidocaine. The patient was injected with a 25-gauge needle at the anterior lateral aspect of her right flexed knee. There were no complications. The patient tolerated the procedure well. There was minimal bleeding. The patient was instructed to ice her knee upon leaving clinic and refrain from overuse over the next 3 days. The patient was instructed to go to the emergency room with any usual pain, swelling, or redness occurred in the injected area. The patient was given a followup appointment to evaluate response to the injection to his increased range of motion and reduction of pain.  PROCEDURE NOTE  PROCEDURE: left knee joint steroid injection.  PREOPERATIVE DIAGNOSIS: Osteoarthritis of the both knee.  POSTOPERATIVE DIAGNOSIS: Osteoarthritis of the both knee.  PROCEDURE: The patient was apprised of the risks and the  benefits of the procedure and informed consent was obtained. Time-out procedure was performed, with confirmation of the patient's name, date of birth, and correct identification of the left knee to be injected. The patient's knee was then marked at the appropriate site for injection placement. The knee was sterilely prepped with Betadine. A 40 mg (1 milliliter) solution of Kenalog was drawn up into a 5 mL syringe with a 2 mL of 1% lidocaine. The patient was injected with a 25-gauge needle at the anterior medial aspect of her left flexed knee. There were no complications. The patient tolerated the procedure well. There was minimal bleeding. The patient was instructed to ice her knee upon leaving clinic and refrain from overuse over the next 3 days. The patient was instructed to go to the emergency room with any usual pain, swelling, or redness occurred in the injected area. The patient was given a followup appointment to evaluate response to the injection to his increased range of motion and reduction of pain.

## 2016-12-27 NOTE — Telephone Encounter (Signed)
It appears from phone note 12/13/16 that she was concerned about yeast infection, I assume she was referring to diflucan that will interact, but that issues was not brought to Dr Arcelia Jew or my attention.  We can try a terconazole, but if she has continued symptoms she needs to be seen. As for the Diprolene- I am a little confused as to what this is for as it was not addressed on her last 2 visits.  Prior to her establishing at our clinic she had numerous ED and urgent care visits and it apepars she may have been diagnosed with ezcema.  I will fill a limited script for this but she needs to have an appointment with me as soon as possible to having ongoing refills for these medications to ensure that they are accurate and appropriate.

## 2016-12-30 NOTE — Telephone Encounter (Signed)
Per dr Heber Venango, pt needs regular appts start asap pcp

## 2016-12-31 NOTE — Telephone Encounter (Signed)
First available with Dr. Heber Exeter will be in July and template is not available yet for scheduling.  He is booked for May and must be on B service in June, because no schedule is in Epic for that month.

## 2017-01-10 ENCOUNTER — Telehealth: Payer: Self-pay | Admitting: Neurology

## 2017-01-10 NOTE — Telephone Encounter (Addendum)
Spoke to pt - her PCP is refilling her Plavix and she will follow up here, if needed.

## 2017-01-10 NOTE — Telephone Encounter (Signed)
Pt called wanting to know if she should make a f/u appt, last OV said to f/u if symptoms worsen. Please call to advise

## 2017-01-13 ENCOUNTER — Ambulatory Visit: Payer: Medicare Other

## 2017-01-14 ENCOUNTER — Other Ambulatory Visit: Payer: Self-pay | Admitting: Internal Medicine

## 2017-01-27 ENCOUNTER — Ambulatory Visit: Payer: Medicare Other

## 2017-02-14 ENCOUNTER — Ambulatory Visit: Payer: Medicare Other

## 2017-02-15 ENCOUNTER — Other Ambulatory Visit (HOSPITAL_COMMUNITY)
Admission: RE | Admit: 2017-02-15 | Discharge: 2017-02-15 | Disposition: A | Discharge status: Home / Self Care | Payer: Medicare Other | Source: Ambulatory Visit | Attending: Internal Medicine | Admitting: Internal Medicine

## 2017-02-15 ENCOUNTER — Encounter: Payer: Self-pay | Admitting: Internal Medicine

## 2017-02-15 ENCOUNTER — Ambulatory Visit (INDEPENDENT_AMBULATORY_CARE_PROVIDER_SITE_OTHER): Payer: Medicare Other | Admitting: Internal Medicine

## 2017-02-15 VITALS — BP 135/74 | HR 84 | Temp 98.5°F | Ht 64.0 in | Wt 127.1 lb

## 2017-02-15 DIAGNOSIS — N898 Other specified noninflammatory disorders of vagina: Secondary | ICD-10-CM | POA: Insufficient documentation

## 2017-02-15 DIAGNOSIS — R300 Dysuria: Secondary | ICD-10-CM | POA: Diagnosis not present

## 2017-02-15 DIAGNOSIS — F17211 Nicotine dependence, cigarettes, in remission: Secondary | ICD-10-CM

## 2017-02-15 DIAGNOSIS — Z111 Encounter for screening for respiratory tuberculosis: Secondary | ICD-10-CM

## 2017-02-15 NOTE — Assessment & Plan Note (Signed)
HPI: Increased urinary urgency without obvious discoloration or odor. She is unable to describe her symptoms very explicitly but actual pain is minimal. She does not have a history of many past UTIs. UA is positive for few leukocytes without bacteria or nitrites.  A: Possible UTI versus irritation from vaginitis. If strongly positive findings for infection on culture this may also explain her GU complaints today. If negative we will need to follow up if her urinary frequency improves with treating that problem.  P: We will send urine for culture today

## 2017-02-15 NOTE — Patient Instructions (Signed)
It was a pleasure to see you today Mercedes Gardner.  You will need to return Thursday or Friday and have your skin test read.  We will contact you with the result of your exam today with treatment recommendations.  For knee injections 3 months since the last one would be on 7/12. You can coordinate this with Dr. Heber Belknap.

## 2017-02-15 NOTE — Assessment & Plan Note (Signed)
HPI: She has vaginal irritation without much discharge that is ongoing for at least the past few weeks. She was treated for a vaginal yeast infection last month with terconazole. This was diagnosed at a hospitalization for stroke and initially to be treated with fluconazole but this plan was changed due to medication interactions.  A: Possible infectious versus atrophic vaginitis. I do not see much evidence for inflammation, discharge, or odor on pelvic exam today. Given her age and this problem quickly recurring without clear resolution after her prior antifungal therapy I have some suspicion for noninfectious vaginitis.  P: Wet prep today If positive will treat accordingly If negative will recommend trying vaginal estrogen

## 2017-02-15 NOTE — Progress Notes (Signed)
   CC: Vaginal irritation  HPI:  Mercedes Gardner is a 71 y.o. woman here needing a TB skin test for work and with a new complaint of vaginal irritation.   See problem based assessment and plan below for additional details  Past Medical History:  Diagnosis Date  . Anemia   . Anxiety   . Arthritis    knees   . Dysrhythmia    heart skips a beat   . GERD (gastroesophageal reflux disease)   . Gout   . Hyperlipidemia April 2014  . Hypertension   . Shortness of breath dyspnea    due to pressure of cyst per patient   . Stroke ([place])   . TIA (transient ischemic attack)     Review of Systems:  Review of Systems  Constitutional: Negative for chills and fever.  Gastrointestinal: Positive for constipation. Negative for abdominal pain and nausea.  Genitourinary: Negative for dysuria, frequency and hematuria.  Skin: Negative for itching and rash.  Neurological: Negative for sensory change.    Physical Exam: Physical Exam  Constitutional: She is well-developed, well-nourished, and in no distress.  Cardiovascular: Normal rate and regular rhythm.   Pulmonary/Chest: Effort normal and breath sounds normal.  Genitourinary: Vagina normal and cervix normal. No vaginal discharge found.  Musculoskeletal: She exhibits no edema.  Skin: Skin is warm. No rash noted.    Vitals:   02/15/17 0841  BP: 135/74  Pulse: 84  Temp: 98.5 F (36.9 C)  TempSrc: Oral  SpO2: 100%  Weight: 127 lb 1.6 oz (57.7 kg)  Height: 5\' 4"  (1.626 m)    Assessment & Plan:   See Encounters Tab for problem based charting.  Patient discussed with Dr. Lynnae January

## 2017-02-15 NOTE — Addendum Note (Signed)
Addended by: Marcelino Duster on: 02/15/2017 02:26 PM   Modules accepted: Orders

## 2017-02-16 NOTE — Progress Notes (Signed)
Internal Medicine Clinic Attending  Case discussed with Dr. Rice at the time of the visit.  We reviewed the resident's history and exam and pertinent patient test results.  I agree with the assessment, diagnosis, and plan of care documented in the resident's note.  

## 2017-02-17 LAB — URINE CULTURE

## 2017-02-17 LAB — CERVICOVAGINAL ANCILLARY ONLY: WET PREP (BD AFFIRM): NEGATIVE

## 2017-02-17 LAB — TB SKIN TEST
INDURATION: 0 mm
TB Skin Test: NEGATIVE

## 2017-02-18 ENCOUNTER — Telehealth: Payer: Self-pay | Admitting: Internal Medicine

## 2017-02-18 DIAGNOSIS — N898 Other specified noninflammatory disorders of vagina: Secondary | ICD-10-CM

## 2017-02-21 ENCOUNTER — Telehealth: Payer: Self-pay

## 2017-02-21 NOTE — Telephone Encounter (Signed)
Requesting test results. Please call back.  

## 2017-02-22 MED ORDER — ESTROGENS, CONJUGATED 0.625 MG/GM VA CREA: 1.0000 | TOPICAL_CREAM | Freq: Every day | VAGINAL | 12 refills | Status: AC

## 2017-02-22 NOTE — Telephone Encounter (Signed)
I called Mercedes Gardner to discuss her results that showed no urinary tract infection or infectious cause for vaginitis. 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 I sent a prescription for vaginal estrogen to her pharmacy today that she agrees to try using and see if it helps her symptoms.

## 2017-02-22 NOTE — Telephone Encounter (Signed)
I spoke to her this morning about her normal results. I sent a prescription treatment to her pharmacy based on this.

## 2017-03-23 ENCOUNTER — Encounter: Payer: Self-pay | Admitting: Internal Medicine

## 2017-03-23 DIAGNOSIS — K439 Ventral hernia without obstruction or gangrene: Secondary | ICD-10-CM | POA: Insufficient documentation

## 2017-03-23 NOTE — Progress Notes (Signed)
[facility] Subjective:  HPI: [name] is a 71 y.o. female who presents for chronic knee pain.  Please see Assessment and Plan below for the status of her chronic medical problems.  Review of Systems: Review of Systems  Constitutional: Negative for malaise/fatigue.  Respiratory: Negative for cough and shortness of breath.   Cardiovascular: Negative for chest pain.  Gastrointestinal: Negative for abdominal pain.  Genitourinary: Negative for dysuria.  Musculoskeletal: Positive for joint pain.  Neurological: Negative for dizziness.  Endo/Heme/Allergies: Negative for polydipsia.    Objective:  Physical Exam: Vitals:   03/24/17 0925  BP: 124/72  Pulse: 83  Temp: 98.6 F (37 C)  TempSrc: Oral  SpO2: 100%  Weight: 130 lb 1.6 oz (59 kg)  Height: 5\' 5"  (1.651 m)   Physical Exam  Constitutional: She is well-developed, well-nourished, and in no distress.  Cardiovascular: Normal rate and regular rhythm.   Pulmonary/Chest: Effort normal and breath sounds normal.  Abdominal: Soft. Bowel sounds are normal.  Musculoskeletal: She exhibits no edema.       Right knee: She exhibits normal range of motion, no swelling and no erythema. Tenderness found. Medial joint line and lateral joint line tenderness noted.       Left knee: She exhibits normal range of motion, no effusion and no erythema. Tenderness found. Medial joint line and lateral joint line tenderness noted.  Psychiatric: Affect normal.  Nursing note and vitals reviewed.  Assessment & Plan:  Essential hypertension HPI: No complaints BP medication, denies any dizziness.  A: Essential HTN well controlled  P: Continue amloidpine 10mg  daily  Ventral hernia without obstruction or gangrene Small fat containing hernia noted on CT abd/pelvis in Jan 2018 -Monitor  History of stroke She reports she has done very well with physical therapy since her stroke.  Her left sided weakness has resolved.  She  denies any dysarthria. -Continue ASA and plavix (can reduce ASA dose to 81mg ), continue crestor 40mg  daily  Incidental lung nodule, greater than or equal to 3mm CT in Jan of 2018 revealed 72mm ground glass nodule.  Was smoker at time.  Repeat CT recommended at 6 to 12 months -Did not have time to discuss this with her today. Will order repeat CT at next visit which will be about 9 months.  Osteoarthritis of both knees HPI: Has B/L OA, wants to avoid surgery as long as possible.  Has responded well to steroid injections in the past, last injections were 3 months ago. She would like them repeated today. Her previous PCP also provided her with Tylenol #3, 30 pills a month for breakthrough pain,  She has does well with this, she notes that it allows her to remain function around the house and tolerate "bad days"  A: OA of b/l knee  P: Steroid injection to bilateral knees today I reviewed her 12 month history of controlled substances on the [place] CSRS and she did indeed receive 30 pills of Tylenol 3 regularly from her previous PCP.  I discussed chronic pain management with her using this medication and our policies.  She signed a controlled substance agreement policy.  I will see her back in 3 months.  Generalized anxiety disorder HPI: Reports she does well on buspar 7.5mg  BID.  She however notes that she has lost part of the perscription.  She called the pharmacy during the visit.  She has no refills but last filled the medication 15 days ago, she may go for refill but insurance  will not likely cover this extra fill.  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  She also thinks this is associated with yeast infection so she typically uses antifungal creams too.  A: Eczema  P: Yeast would be atypical in the areas she is describing.  I asked her to stop the antifungal, can continue  the steroid cream for now.   Medications Ordered Meds ordered this encounter  Medications  . busPIRone (BUSPAR) 7.5 MG tablet    Sig: Take 1 tablet (7.5 mg total) by mouth 2 (two) times daily.    Dispense:  60 tablet    Refill:  2  . augmented betamethasone dipropionate (DIPROLENE-AF) 0.05 % cream    Sig: apply to affected area twice a day if needed    Dispense:  50 g    Refill:  1  . rosuvastatin (CRESTOR) 20 MG tablet    Sig: Take 1 tablet (20 mg total) by mouth daily.    Dispense:  90 tablet    Refill:  3  . aspirin EC 81 MG tablet    Sig: Take 1 tablet (81 mg total) by mouth daily.    Dispense:  150 tablet    Refill:  2  . acetaminophen-codeine (TYLENOL #3) 300-30 MG tablet    Sig: Take 1 tablet by mouth daily as needed (pain).    Dispense:  30 tablet    Refill:  2   Other Orders No orders of the defined types were placed in this encounter.  Follow Up: Return in about 3 months (around 06/24/2017).  PROCEDURE NOTE- right knee  PROCEDURE: right knee joint steroid injection.  PREOPERATIVE DIAGNOSIS: Osteoarthritis of the bilateral knee.  POSTOPERATIVE DIAGNOSIS: Osteoarthritis of the bilateral knee.  PROCEDURE: The patient was apprised of the risks and the benefits of the procedure and informed consent was obtained, as witnessed by Lela. Time-out procedure was performed, with confirmation of the patient's name, date of birth, and correct identification of the right knee to be injected. The patient's knee was then marked at the appropriate site for injection placement. The knee was sterilely prepped with Betadine. A 40 mg (1 milliliter) solution of Kenalog was drawn up into a 5 mL syringe with a 2 mL of 1% lidocaine. The patient was injected with a 25-gauge needle at the anterior-lateral aspect of her right flexed knee. There were no complications. The patient tolerated the procedure well. There was minimal bleeding. The patient was instructed to ice her knee upon leaving  clinic and refrain from overuse over the next 3 days. The patient was instructed to go to the emergency room with any usual pain, swelling, or redness occurred in the injected area. The patient was given a followup appointment to evaluate response to the injection to his increased range of motion and reduction of pain.  PROCEDURE NOTE- left knee  PROCEDURE: left knee joint steroid injection.  PREOPERATIVE DIAGNOSIS: Osteoarthritis of the bilateral knee.  POSTOPERATIVE DIAGNOSIS: Osteoarthritis of the bilateral knee.  PROCEDURE: The patient was apprised of the risks and the benefits of the procedure and informed consent was obtained, as witnessed by Lela. Time-out procedure was performed, with confirmation of the patient's name, date of birth, and correct identification of the left knee to be injected. The patient's knee was then marked at the appropriate site for injection placement. The knee was sterilely prepped with  Betadine. A 40 mg (1 milliliter) solution of Kenalog was drawn up into a 5 mL syringe with a 2 mL of 1% lidocaine. The patient was injected with a 25-gauge needle at the anterior lateral aspect of her left flexed knee. There were no complications. The patient tolerated the procedure well. There was minimal bleeding. The patient was instructed to ice her knee upon leaving clinic and refrain from overuse over the next 3 days. The patient was instructed to go to the emergency room with any usual pain, swelling, or redness occurred in the injected area. The patient was given a followup appointment to evaluate response to the injection to his increased range of motion and reduction of pain.

## 2017-03-24 ENCOUNTER — Encounter: Payer: Self-pay | Admitting: Internal Medicine

## 2017-03-24 ENCOUNTER — Ambulatory Visit (INDEPENDENT_AMBULATORY_CARE_PROVIDER_SITE_OTHER): Payer: Medicare Other | Admitting: Internal Medicine

## 2017-03-24 VITALS — BP 124/72 | HR 83 | Temp 98.6°F | Ht 65.0 in | Wt 130.1 lb

## 2017-03-24 DIAGNOSIS — R911 Solitary pulmonary nodule: Secondary | ICD-10-CM | POA: Diagnosis not present

## 2017-03-24 DIAGNOSIS — F17211 Nicotine dependence, cigarettes, in remission: Secondary | ICD-10-CM

## 2017-03-24 DIAGNOSIS — F411 Generalized anxiety disorder: Secondary | ICD-10-CM

## 2017-03-24 DIAGNOSIS — L309 Dermatitis, unspecified: Secondary | ICD-10-CM | POA: Diagnosis not present

## 2017-03-24 DIAGNOSIS — Z7902 Long term (current) use of antithrombotics/antiplatelets: Secondary | ICD-10-CM | POA: Diagnosis not present

## 2017-03-24 DIAGNOSIS — Z79899 Other long term (current) drug therapy: Secondary | ICD-10-CM

## 2017-03-24 DIAGNOSIS — M170 Bilateral primary osteoarthritis of knee: Secondary | ICD-10-CM

## 2017-03-24 DIAGNOSIS — Z8673 Personal history of transient ischemic attack (TIA), and cerebral infarction without residual deficits: Secondary | ICD-10-CM | POA: Diagnosis not present

## 2017-03-24 DIAGNOSIS — I69398 Other sequelae of cerebral infarction: Secondary | ICD-10-CM

## 2017-03-24 DIAGNOSIS — Z7982 Long term (current) use of aspirin: Secondary | ICD-10-CM

## 2017-03-24 DIAGNOSIS — K439 Ventral hernia without obstruction or gangrene: Secondary | ICD-10-CM | POA: Diagnosis not present

## 2017-03-24 DIAGNOSIS — Z8739 Personal history of other diseases of the musculoskeletal system and connective tissue: Secondary | ICD-10-CM

## 2017-03-24 DIAGNOSIS — Z79891 Long term (current) use of opiate analgesic: Secondary | ICD-10-CM

## 2017-03-24 DIAGNOSIS — R269 Unspecified abnormalities of gait and mobility: Principal | ICD-10-CM

## 2017-03-24 DIAGNOSIS — I10 Essential (primary) hypertension: Secondary | ICD-10-CM | POA: Diagnosis not present

## 2017-03-24 MED ORDER — BUSPIRONE HCL 7.5 MG PO TABS: 7.5000 mg | ORAL_TABLET | Freq: Two times a day (BID) | ORAL | 2 refills | Status: DC

## 2017-03-24 MED ORDER — ROSUVASTATIN CALCIUM 20 MG PO TABS: 20.0000 mg | ORAL_TABLET | Freq: Every day | ORAL | 3 refills | Status: AC

## 2017-03-24 MED ORDER — ASPIRIN EC 81 MG PO TBEC
81.0000 mg | DELAYED_RELEASE_TABLET | Freq: Every day | ORAL | 2 refills | Status: AC
Start: 2017-03-24 — End: 2018-03-24

## 2017-03-24 MED ORDER — BETAMETHASONE DIPROPIONATE AUG 0.05 % EX CREA: TOPICAL_CREAM | CUTANEOUS | 1 refills | Status: DC

## 2017-03-24 MED ORDER — ACETAMINOPHEN-CODEINE #3 300-30 MG PO TABS: 1.0000 | ORAL_TABLET | Freq: Every day | ORAL | 2 refills | Status: DC | PRN

## 2017-03-24 NOTE — Assessment & Plan Note (Signed)
HPI: Reports she does well on buspar 7.5mg  BID.  She however notes that she has lost part of the perscription.  She called the pharmacy during the visit.  She has no refills but last filled the medication 15 days ago, she may go for refill but insurance will not likely cover this extra fill.  A: GAD  P:Continue buspar 7.5mg  BID

## 2017-03-24 NOTE — Assessment & Plan Note (Signed)
She reports a history of ezcema, she was been using  Diprolene cream with good results.  She also thinks this is associated with yeast infection so she typically uses antifungal creams too.  A: Eczema  P: Yeast would be atypical in the areas she is describing.  I asked her to stop the antifungal, can continue the steroid cream for now.

## 2017-03-24 NOTE — Assessment & Plan Note (Signed)
HPI: Has B/L OA, wants to avoid surgery as long as possible.  Has responded well to steroid injections in the past, last injections were 3 months ago. She would like them repeated today. Her previous PCP also provided her with Tylenol #3, 30 pills a month for breakthrough pain,  She has does well with this, she notes that it allows her to remain function around the house and tolerate "bad days"  A: OA of b/l knee  P: Steroid injection to bilateral knees today I reviewed her 12 month history of controlled substances on the [place] CSRS and she did indeed receive 30 pills of Tylenol 3 regularly from her previous PCP.  I discussed chronic pain management with her using this medication and our policies.  She signed a controlled substance agreement policy.  I will see her back in 3 months.

## 2017-03-24 NOTE — Patient Instructions (Signed)
Please keep up the good work.  Let me know if you have any fevers or worsening pain in your knees.

## 2017-03-24 NOTE — Assessment & Plan Note (Addendum)
CT in Jan of 2018 revealed 37mm ground glass nodule.  Was smoker at time.  Repeat CT recommended at 6 to 12 months -Did not have time to discuss this with her today. Will order repeat CT at next visit which will be about 9 months.

## 2017-03-24 NOTE — Assessment & Plan Note (Signed)
HPI: No complaints BP medication, denies any dizziness.  A: Essential HTN well controlled  P: Continue amloidpine 10mg  daily

## 2017-03-24 NOTE — Assessment & Plan Note (Signed)
She reports she has done very well with physical therapy since her stroke.  Her left sided weakness has resolved.  She denies any dysarthria. -Continue ASA and plavix (can reduce ASA dose to 81mg ), continue crestor 40mg  daily

## 2017-03-24 NOTE — Assessment & Plan Note (Signed)
Small fat containing hernia noted on CT abd/pelvis in Jan 2018 -Monitor

## 2017-03-25 ENCOUNTER — Other Ambulatory Visit (HOSPITAL_COMMUNITY): Payer: Self-pay | Admitting: Interventional Radiology

## 2017-03-25 DIAGNOSIS — I651 Occlusion and stenosis of basilar artery: Secondary | ICD-10-CM

## 2017-04-15 ENCOUNTER — Encounter (HOSPITAL_COMMUNITY): Payer: Self-pay

## 2017-04-15 ENCOUNTER — Ambulatory Visit (HOSPITAL_COMMUNITY)
Admission: RE | Admit: 2017-04-15 | Discharge: 2017-04-15 | Disposition: A | Discharge status: Home / Self Care | Payer: Medicare Other | Source: Ambulatory Visit | Attending: Interventional Radiology | Admitting: Interventional Radiology

## 2017-04-15 ENCOUNTER — Ambulatory Visit (HOSPITAL_COMMUNITY): Admission: RE | Admit: 2017-04-15 | Payer: Medicare Other | Source: Ambulatory Visit

## 2017-04-26 ENCOUNTER — Ambulatory Visit (HOSPITAL_COMMUNITY)
Admission: EM | Admit: 2017-04-26 | Discharge: 2017-04-26 | Disposition: A | Discharge status: Home / Self Care | Payer: Medicare Other | Attending: Emergency Medicine | Admitting: Emergency Medicine

## 2017-04-26 ENCOUNTER — Encounter (HOSPITAL_COMMUNITY): Payer: Self-pay | Admitting: Emergency Medicine

## 2017-04-26 DIAGNOSIS — R0982 Postnasal drip: Secondary | ICD-10-CM | POA: Diagnosis not present

## 2017-04-26 DIAGNOSIS — R42 Dizziness and giddiness: Secondary | ICD-10-CM | POA: Diagnosis not present

## 2017-04-26 DIAGNOSIS — J321 Chronic frontal sinusitis: Secondary | ICD-10-CM | POA: Diagnosis not present

## 2017-04-26 MED ORDER — PREDNISONE 50 MG PO TABS: ORAL_TABLET | ORAL | 0 refills | Status: DC

## 2017-04-26 MED ORDER — MECLIZINE HCL 25 MG PO TABS: ORAL_TABLET | ORAL | 0 refills | Status: AC

## 2017-04-26 NOTE — ED Triage Notes (Signed)
PT reports congestion, facial pain, dizziness, and headache for 4 days.

## 2017-04-26 NOTE — ED Provider Notes (Signed)
[place]    CSN: [id number] Arrival date & time: 04/26/17  1758     History   Chief Complaint Chief Complaint  Patient presents with  . Facial Pain  . Dizziness    HPI Mercedes Gardner is a 71 y.o. female.   71 year old female complaining of a lot of mucus in her nose, pressure in her face and forehead, dizziness, having to blow her nose a lot pressure in the head. Denies fever or chills. Denies earache. Denies sore throat. She does have some PND. She has not taking Allegra which helps and drinking more water was also helps.      Past Medical History:  Diagnosis Date  . Anemia   . Anxiety   . Arthritis    knees   . Dysrhythmia    heart skips a beat   . GERD (gastroesophageal reflux disease)   . Gout   . Hyperlipidemia April 2014  . Hypertension   . Ovarian cyst    removed during mini lap 2016, benign path, Dr Denman George  . Shortness of breath dyspnea    due to pressure of cyst per patient   . Stroke ([place])   . 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 TIA (transient ischemic attack)     Patient Active Problem List   Diagnosis Date Noted  . Generalized anxiety disorder 03/24/2017  . Eczema of both upper extremities 03/24/2017  . Ventral hernia without obstruction or gangrene 03/23/2017  . Healthcare maintenance 12/07/2016  . Incidental lung nodule, greater than or equal to 64mm   . History of stroke   . Normocytic anemia 10/11/2016  . Personal history of gout 12/24/2008  . ALLERGIC RHINITIS 12/20/2008  . Osteoarthritis of both knees 12/20/2008  . TOBACCO ABUSE 07/29/2006  . Essential hypertension 07/29/2006    Past Surgical History:  Procedure Laterality Date  . CESAREAN SECTION     x3  . IR GENERIC HISTORICAL  11/30/2016   IR ANGIO VERTEBRAL SEL VERTEBRAL UNI L MOD SED 11/30/2016 Luanne Bras, MD [facility]  . IR GENERIC HISTORICAL  11/30/2016   IR ANGIO INTRA EXTRACRAN SEL COM CAROTID INNOMINATE BILAT MOD SED 11/30/2016  Luanne Bras, MD [facility]  . IR GENERIC HISTORICAL  11/30/2016   IR ANGIO VERTEBRAL SEL SUBCLAVIAN INNOMINATE UNI R MOD SED 11/30/2016 Luanne Bras, MD [facility]  . OVARIAN CYST SURGERY Right 1980's  . SALPINGOOPHORECTOMY Bilateral 02/06/2015   Procedure: Campbell Stall LAPAROTOMY/BILATERAL SALPINGO OOPHORECTOMY;  Surgeon: Everitt Amber, MD;  Location: [facility];  Service: Gynecology;  Laterality: Bilateral;    OB History    Gravida Para Term Preterm AB Living   4 3 3   1      SAB TAB Ectopic Multiple Live Births                   Home Medications    Prior to Admission medications   Medication Sig Start Date End Date Taking? Authorizing Provider  acetaminophen-codeine (TYLENOL #3) 300-30 MG tablet Take 1 tablet by mouth daily as needed (pain). 03/24/17   Lucious Groves, DO  amLODipine (NORVASC) 10 MG tablet take 1 tablet by mouth once daily 12/27/16   Lucious Groves, DO  aspirin EC 81 MG tablet Take 1 tablet (81 mg total) by mouth daily. 03/24/17 03/24/18  Lucious Groves, DO  augmented betamethasone dipropionate (DIPROLENE-AF) 0.05 % cream apply to affected area twice a day if needed 03/24/17   Joni Reining  C, DO  busPIRone (BUSPAR) 7.5 MG tablet Take 1 tablet (7.5 mg total) by mouth 2 (two) times daily. 03/24/17   Lucious Groves, DO  clopidogrel (PLAVIX) 75 MG tablet take 1 tablet by mouth once daily 01/14/17   Lucious Groves, DO  conjugated estrogens (PREMARIN) vaginal cream Place 1 Applicatorful vaginally daily. 02/22/17   Rice, Resa Miner, MD  diclofenac sodium (VOLTAREN) 1 % GEL Apply 4 g topically 4 (four) times daily. To both knees, please instruct in dosing. 12/07/16   Velna Ochs, MD  ferrous sulfate 325 (65 FE) MG tablet Take 1 tablet (325 mg total) by mouth 2 (two) times daily with a meal. 10/22/16   Angiulli, Lavon Paganini, PA-C  fluticasone (FLONASE) 50 MCG/ACT nasal spray Place 2 sprays into both nostrils as needed.  04/17/14   Presson, Audelia Hives, PA  folic acid  (FOLVITE) 1 MG tablet Take 1 tablet (1 mg total) by mouth daily. 10/22/16   Angiulli, Lavon Paganini, PA-C  meclizine (ANTIVERT) 25 MG tablet Take 1/2-1 tablet up to 3 times a day as needed for dizziness may calls some drowsiness. 04/26/17   Janne Napoleon, NP  Multiple Vitamin (MULTIVITAMIN WITH MINERALS) TABS tablet Take 1 tablet by mouth daily. 10/22/16   Angiulli, Lavon Paganini, PA-C  predniSONE (DELTASONE) 50 MG tablet 1 tab po daily for 6 days. Take with food. 04/26/17   Janne Napoleon, NP  rosuvastatin (CRESTOR) 20 MG tablet Take 1 tablet (20 mg total) by mouth daily. 03/24/17   Lucious Groves, DO  Tetrahydroz-Dextran-PEG-Povid 0.05-0.1-1-1 % SOLN Place 1 drop into both eyes daily as needed (dry eyes). Generic OTC eye drops    [provider]    Family History Family History  Problem Relation Age of Onset  . Hypertension Mother   . Diabetes Mother   . Breast cancer Mother   . Diabetes Sister   . Hypertension Sister   . Other Father        accident    Social History Social History  Substance Use Topics  . Smoking status: Former Smoker    Packs/day: 0.02    Types: Cigarettes    Quit date: 10/09/2016  . Smokeless tobacco: Never Used     Comment: Quit 10/2016  . Alcohol use No     Comment: Quit 10/2016     Allergies   Tramadol and Topiramate   Review of Systems Review of Systems  Constitutional: Negative for activity change, appetite change, chills, fatigue and fever.  HENT: Positive for congestion, postnasal drip, rhinorrhea and sinus pressure. Negative for facial swelling.   Eyes: Negative.   Respiratory: Negative.   Cardiovascular: Negative.   Musculoskeletal: Negative for neck pain and neck stiffness.  Skin: Negative for pallor and rash.  Neurological: Negative.   All other systems reviewed and are negative.    Physical Exam Triage Vital Signs ED Triage Vitals  Enc Vitals Group     BP 04/26/17 1842 133/72     Pulse Rate 04/26/17 1842 87     Resp 04/26/17 1842 16      Temp 04/26/17 1842 98.4 F (36.9 C)     Temp Source 04/26/17 1842 Oral     SpO2 04/26/17 1842 99 %     Weight 04/26/17 1841 130 lb (59 kg)     Height 04/26/17 1841 5\' 4"  (1.626 m)     Head Circumference --      Peak Flow --      Pain Score  04/26/17 1842 9     Pain Loc --      Pain Edu? --      Excl. in [place]? --    No data found.   Updated Vital Signs BP 133/72   Pulse 87   Temp 98.4 F (36.9 C) (Oral)   Resp 16   Ht 5\' 4"  (1.626 m)   Wt 130 lb (59 kg)   SpO2 99%   BMI 22.31 kg/m   Visual Acuity Right Eye Distance:   Left Eye Distance:   Bilateral Distance:    Right Eye Near:   Left Eye Near:    Bilateral Near:     Physical Exam  Constitutional: She is oriented to person, place, and time. She appears well-developed and well-nourished. No distress.  HENT:  Bilateral TMs are retracted otherwise normal. Oropharynx with minor cobblestoning and clear PND. No exudate  Neck: Normal range of motion. Neck supple.  Cardiovascular: Normal rate and regular rhythm.   Pulmonary/Chest: Effort normal and breath sounds normal. No respiratory distress.  Musculoskeletal: Normal range of motion. She exhibits no edema.  Lymphadenopathy:    She has no cervical adenopathy.  Neurological: She is alert and oriented to person, place, and time.  Skin: Skin is warm and dry. No rash noted.  Psychiatric: She has a normal mood and affect.  Nursing note and vitals reviewed.    UC Treatments / Results  Labs (all labs ordered are listed, but only abnormal results are displayed) Labs Reviewed - No data to display  EKG  EKG Interpretation None       Radiology No results found.  Procedures Procedures (including critical care time)  Medications Ordered in UC Medications - No data to display   Initial Impression / Assessment and Plan / UC Course  I have reviewed the triage vital signs and the nursing notes.  Pertinent labs & imaging results that were available during my care of  the patient were reviewed by me and considered in my medical decision making (see chart for details).     Start taking the [employer]. Also can take Sudafed PE 10 mg every 6 hours as needed for congestion. Be sure to drink plenty of water and stay hydrated. He may also use copious amounts of saline nasal spray to help clear your nose and then your secretions. Prescriptions have been sent to the drugstore. Remember that the dizziness medication can make you drowsy. Start with half a tablet first.   Final Clinical Impressions(s) / UC Diagnoses   Final diagnoses:  Dizziness  Chronic frontal sinusitis  PND (post-nasal drip)    New Prescriptions New Prescriptions   MECLIZINE (ANTIVERT) 25 MG TABLET    Take 1/2-1 tablet up to 3 times a day as needed for dizziness may calls some drowsiness.   PREDNISONE (DELTASONE) 50 MG TABLET    1 tab po daily for 6 days. Take with food.     Controlled Substance Prescriptions [place] Controlled Substance Registry consulted? Not Applicable   Janne Napoleon, NP 04/26/17 2000

## 2017-04-26 NOTE — Discharge Instructions (Signed)
Start taking the [employer]. Also can take Sudafed PE 10 mg every 6 hours as needed for congestion. Be sure to drink plenty of water and stay hydrated. He may also use copious amounts of saline nasal spray to help clear your nose and then your secretions. Prescriptions have been sent to the drugstore. Remember that the dizziness medication can make you drowsy. Start with half a tablet first.

## 2017-04-27 ENCOUNTER — Other Ambulatory Visit: Payer: Self-pay | Admitting: *Deleted

## 2017-04-28 NOTE — Telephone Encounter (Signed)
I had asked her to stop the antifungal component at our last visit.  Given that she has an appointment to be seen tomorrow I would rather her rash be reevaluated and determined in person if she needs an antifungal

## 2017-04-29 ENCOUNTER — Ambulatory Visit: Payer: Medicare Other

## 2017-05-09 ENCOUNTER — Telehealth: Payer: Self-pay

## 2017-05-09 NOTE — Telephone Encounter (Signed)
Yes, please, she stated she mixes the 2, she is adamant that she cannot come for an appt at present, will call her again and see if we can arrange an appt

## 2017-05-09 NOTE — Telephone Encounter (Signed)
Needs to speak with a nurse about meds. Please call pt back.  

## 2017-05-09 NOTE — Telephone Encounter (Signed)
Pt calls and states she is breaking out more, it is spreading over her hands, arms and body, she states she needs an antifungal and the diprolene cream and a large amt. Your note from July was reviewed and saw where you stopped the antifungal. Offered and appt, she refused stating she cannot get off work at this time to come for an appt. Please advise

## 2017-05-09 NOTE — Telephone Encounter (Signed)
I am not sure she actually has a yeast infection of her skin, would certainly like someone to view the rash, her previous diagnosis mostly comes from ER visits, I do not think she has ever had a skin biopsy or dermatology evaluation.  She certainly can obtain OTC clotrimazole (the antifungal agent that was part of the combination cream) for the areas that are affected I suspect (without being able to visualize the area) that it is more likely ezcema and the diprolene cream is the more effective agent, that is by prescription, does she need a refill of that?

## 2017-05-10 NOTE — Telephone Encounter (Signed)
Pt agrred to an appt but will not come in till 9/6, states she cant, resolved

## 2017-05-18 ENCOUNTER — Telehealth: Payer: Self-pay

## 2017-05-18 NOTE — Telephone Encounter (Signed)
Called pt and she explains [place] the beach and [place] her buspar and flonase there, she states she called where she statyed and yes they found them, ask if they could mail them to her and she stated they told her they would try??? She is asking for refills Called pharmacy and pharm states she has gotten all the refills given by dr Heber [place] on 7/12 #60 w/ 2 add ref They also state that dr Nolene Ebbs prescribes buspar for her and he just sent a new script 7/12 also #60 w/ 5 add ref Her refill hx: 6/5 #60 6/24 #4 6/27 #56 7/12 #20 7/23 #60 8/13 #60 That is #260 in 41months

## 2017-05-18 NOTE — Telephone Encounter (Signed)
fluticasone (FLONASE) 50 MCG/ACT nasal spray,  busPIRone (BUSPAR) 7.5 MG tablet, refill request. Per patient she left two of these meds at the beach. Please call pt back.

## 2017-05-19 ENCOUNTER — Ambulatory Visit: Payer: Medicare Other

## 2017-05-19 NOTE — Telephone Encounter (Signed)
Thank you Bonnita Nasuti, it does appear she has been using this medication inappropriatly, I also believe Dr Jeanie Cooks practices general medicine. I would recommend not providing an additional refill at this time. We should again offer her a follow up appointment to straighten out issues with her medications and to help clarify that she should not be seeing two different providers for this issue.

## 2017-05-23 ENCOUNTER — Ambulatory Visit (INDEPENDENT_AMBULATORY_CARE_PROVIDER_SITE_OTHER): Payer: Medicare Other | Admitting: *Deleted

## 2017-05-23 DIAGNOSIS — Z111 Encounter for screening for respiratory tuberculosis: Secondary | ICD-10-CM

## 2017-05-23 DIAGNOSIS — Z23 Encounter for immunization: Secondary | ICD-10-CM

## 2017-05-23 NOTE — Progress Notes (Signed)
TB skin test needed for her job return on Wed.

## 2017-05-25 ENCOUNTER — Encounter: Payer: Self-pay | Admitting: Internal Medicine

## 2017-05-25 ENCOUNTER — Ambulatory Visit: Payer: Medicare Other

## 2017-06-08 ENCOUNTER — Encounter: Payer: Self-pay | Admitting: Internal Medicine

## 2017-06-08 ENCOUNTER — Ambulatory Visit (INDEPENDENT_AMBULATORY_CARE_PROVIDER_SITE_OTHER): Payer: Medicare Other | Admitting: Internal Medicine

## 2017-06-08 VITALS — BP 132/73 | HR 87 | Temp 98.4°F | Ht 65.0 in | Wt 132.7 lb

## 2017-06-08 DIAGNOSIS — F17200 Nicotine dependence, unspecified, uncomplicated: Secondary | ICD-10-CM

## 2017-06-08 DIAGNOSIS — R21 Rash and other nonspecific skin eruption: Secondary | ICD-10-CM | POA: Diagnosis not present

## 2017-06-08 DIAGNOSIS — L309 Dermatitis, unspecified: Secondary | ICD-10-CM

## 2017-06-08 DIAGNOSIS — Z7982 Long term (current) use of aspirin: Secondary | ICD-10-CM

## 2017-06-08 DIAGNOSIS — Z111 Encounter for screening for respiratory tuberculosis: Secondary | ICD-10-CM | POA: Diagnosis present

## 2017-06-08 DIAGNOSIS — F17210 Nicotine dependence, cigarettes, uncomplicated: Secondary | ICD-10-CM | POA: Diagnosis not present

## 2017-06-08 DIAGNOSIS — M2550 Pain in unspecified joint: Secondary | ICD-10-CM | POA: Diagnosis not present

## 2017-06-08 DIAGNOSIS — L988 Other specified disorders of the skin and subcutaneous tissue: Secondary | ICD-10-CM | POA: Diagnosis not present

## 2017-06-08 MED ORDER — CLOTRIMAZOLE 1 % EX CREA: 1.0000 "application " | TOPICAL_CREAM | Freq: Two times a day (BID) | CUTANEOUS | 0 refills | Status: DC

## 2017-06-08 NOTE — Assessment & Plan Note (Signed)
PPD administered in clinic. Patient will return to clinic on Friday for interpretation.

## 2017-06-08 NOTE — Assessment & Plan Note (Addendum)
Cigarette use: 3 cigarettes per day. Motivated to quit and has quit in the past. Requesting nicorette gum sample.   Medication Samples have been provided to the patient.  Drug name: Nicotine gum       Strength: 2 mg      Qty: 1 box, 110 pieces LOT: [id number]  Exp.Date: 4/20  Dosing instructions: chew 1 piece of gum every 2-4 hours as needed; if need more can chew 1 piece every 1-2 hours  The patient has been instructed regarding the correct time, dose, and frequency of taking this medication, including desired effects and most common side effects.   Hulen Shouts LaCroce 9:47 AM 06/08/2017

## 2017-06-08 NOTE — Progress Notes (Signed)
CC: rash, PPD screening  HPI:  [name] is a 71 y.o. with past medical history as documented below presenting for TB skin testing and a physical for a new job. She is also complaining of a rash, which seems to be a chronic issue for the patient. She states the rash is patchy and is typically dry and scaly. It is currently on the palms of her hands, in between her fingers, on her arms, feet, and back. She states it is very itchy and irritating. She denies oozing or drainage. She states the steroid cream she is currently using helps, but she has used antifungal cream in the past, which helps clear the rash even more. She denies childhood eczema and states this occurred during adulthood. 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 She is insistent about the antifungal cream and called [facility] during the appointment to find the name of the antifungal cream she was prescribed in the past. She has never seen a dermatologist. She denies fever, chills, cough, shortness of breath, recent infection, or travel.  The patient was also requesting Nicotine gum samples. She is motivated to quit smoking with her new job. She has quit in the past. She is currently smoking 3 cigarettes per day.   Past Medical History:  Diagnosis Date  . Anemia   . Anxiety   . Arthritis    knees   . Dysrhythmia    heart skips a beat   . GERD (gastroesophageal reflux disease)   . Gout   . Hyperlipidemia April 2014  . Hypertension   . Ovarian cyst    removed during mini lap 2016, benign path, Dr Denman George  . Shortness of breath dyspnea    due to pressure of cyst per patient   . Stroke ([place])   . 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 TIA (transient ischemic attack)    Review of Systems:   Review of Systems  Constitutional: Negative for chills and fever.  Respiratory: Negative for cough and shortness of  breath.   Cardiovascular: Negative for chest pain.  Gastrointestinal: Negative for constipation, diarrhea, nausea and vomiting.  Musculoskeletal: Positive for joint pain.  Skin: Positive for itching and rash.  Psychiatric/Behavioral: The patient is nervous/anxious.     Physical Exam:  Vitals:   06/08/17 0917  BP: 132/73  Pulse: 87  Temp: 98.4 F (36.9 C)  TempSrc: Oral  SpO2: 97%  Weight: 132 lb 11.2 oz (60.2 kg)  Height: 5\' 5"  (1.651 m)     Physical Exam  Constitutional: She is oriented to person, place, and time. She appears well-developed and well-nourished. No distress.  HENT:  Head: Normocephalic and atraumatic.  Eyes: Conjunctivae and EOM are normal.  Neck: Normal range of motion. Neck supple.  Cardiovascular: Normal rate, regular rhythm and normal heart sounds.   Pulmonary/Chest: Effort normal and breath sounds normal.  Abdominal: Soft. Bowel sounds are normal.  Neurological: She is alert and oriented to person, place, and time.  Skin: Rash noted. Rash is papular. Rash is not pustular, not vesicular and not urticarial.  Papular, dried lesions on palms and in between finger tips, non erythematous, no oozing or drainage 1 single plaque on left and right flexor surface of the forearm Several plaques on the patient's sacrum     Assessment & Plan:   See Encounters Tab  for problem based charting.  Patient seen with Dr. Beryle Beams

## 2017-06-08 NOTE — Assessment & Plan Note (Addendum)
The patient's bilateral palmar and sacral distribution of the rash, as well as relief with antifungal ointment is not entirely consistent with Eczema. Ambulatory dermatology referral was placed. Because the patient was insisting the antifungal cream helped Clotrimazole was refilled, as well as diprolene cream. I also suggested the patient return to clinic and make an appointment to only discuss rash.

## 2017-06-08 NOTE — Patient Instructions (Addendum)
Mercedes Gardner,   It was a pleasure meeting you today.  Please return to clinic on Friday for PPD skin testing interpretation.   I have referred you to dermatology for your rash.

## 2017-06-09 NOTE — Progress Notes (Signed)
Medicine attending: I personally interviewed and briefly examined this patient on the day of the patient visit and reviewed pertinent clinical and laboratory  with resident physician Dr. Rochele Pages and we discussed a management plan. Pt here to read a PPD test. C/O persistent atypical palmar rash. She has been using both steroid and antifungal cream. She swears improvement w antifungal. Atypical spotty, micronodular, pigmented rash palms of hands. No rash on extensor surfaces elbows or knees to support eczema/psoriasis. She needs Derm referral for eval & possible bx. She is agreeable but wants to continue her creams.

## 2017-06-10 ENCOUNTER — Ambulatory Visit: Payer: Medicare Other

## 2017-06-10 ENCOUNTER — Ambulatory Visit (INDEPENDENT_AMBULATORY_CARE_PROVIDER_SITE_OTHER): Payer: Medicare Other | Admitting: Internal Medicine

## 2017-06-10 VITALS — BP 136/74 | HR 80 | Temp 98.2°F | Ht 65.0 in | Wt 133.2 lb

## 2017-06-10 DIAGNOSIS — Z7902 Long term (current) use of antithrombotics/antiplatelets: Secondary | ICD-10-CM

## 2017-06-10 DIAGNOSIS — Z8249 Family history of ischemic heart disease and other diseases of the circulatory system: Secondary | ICD-10-CM | POA: Diagnosis not present

## 2017-06-10 DIAGNOSIS — M170 Bilateral primary osteoarthritis of knee: Secondary | ICD-10-CM

## 2017-06-10 DIAGNOSIS — Z833 Family history of diabetes mellitus: Secondary | ICD-10-CM | POA: Diagnosis not present

## 2017-06-10 DIAGNOSIS — Z803 Family history of malignant neoplasm of breast: Secondary | ICD-10-CM | POA: Diagnosis not present

## 2017-06-10 DIAGNOSIS — Z7982 Long term (current) use of aspirin: Secondary | ICD-10-CM

## 2017-06-10 DIAGNOSIS — F17211 Nicotine dependence, cigarettes, in remission: Secondary | ICD-10-CM

## 2017-06-10 LAB — TB SKIN TEST
Induration: 0 mm
TB SKIN TEST: NEGATIVE

## 2017-06-10 NOTE — Patient Instructions (Signed)
Today we treated your bilateral knees with a steroid analgesic combination injection.  I hope your knee injections continue to relieve your pain.  We are happy you came to see [place] today and if you need anything please let us know.

## 2017-06-10 NOTE — Progress Notes (Signed)
PROCEDURE NOTE  PROCEDURE: right and left knee joint steroid injection.  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  PROCEDURE: The patient was apprised of the risks and the benefits of the procedure and informed consent was obtained, as witnessed by [facility]. Time-out procedure was performed, with confirmation of the patient's name, date of birth, and correct identification of the right and left knee to be injected. The patient's knees were then marked at the appropriate sites for injection placement. The knees were sterilely prepped with Betadine. 2 separate 40 mg (1 milliliter) solutions of triamcinolone were drawn up into two separate 5 mL syringes with 2 mL of 1% lidocaine. The patient was injected with a 25-gauge needle at the medial aspect of her right  And left flexed knees. There were no complications. The patient tolerated the procedure well. There was minimal bleeding. The patient was instructed to ice her knees upon leaving clinic and refrain from overuse over the next 3 days. The patient was instructed to go to the emergency room with any usual pain, swelling, or redness occurred in the injected area. The patient was given a followup appointment to evaluate response to the injection to his increased range of motion and reduction of pain.  The procedure was supervised by attending physician, Dr. Evette Doffing, Damita Dunnings

## 2017-06-10 NOTE — Progress Notes (Signed)
CC: bilateral knee pain  HPI:  [name] is a 71 y.o.  Here for bilateral knee injections to treat her osteoarthritis.  Her arthritis is well documented and she is currently trying to extend the time before she would need a knee replacement.  She has had very good results in the past with knee injections. She was just here 2 days ago for a follow-up on her chronic medical issues.  Please see A&P for status of the patient's chronic medical conditions  Past Medical History:  Diagnosis Date  . Anemia   . Anxiety   . Arthritis    knees   . Dysrhythmia    heart skips a beat   . GERD (gastroesophageal reflux disease)   . Gout   . Hyperlipidemia April 2014  . Hypertension   . Ovarian cyst    removed during mini lap 2016, benign path, Dr Denman George  . Shortness of breath dyspnea    due to pressure of cyst per patient   . Stroke ([place])   . 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 TIA (transient ischemic attack)    Review of Systems:  ROS: Pulmonary: pt denies increased work of breathing, shortness of breath,  Cardiac: pt denies palpitations, chest pain,  Abdominal: pt denies abdominal pain, nausea, vomiting, or diarrhea  Physical Exam:  Vitals:   06/10/17 1527  BP: 136/74  Pulse: 80  Temp: 98.2 F (36.8 C)  TempSrc: Oral  SpO2: 100%  Weight: 133 lb 3.2 oz (60.4 kg)  Height: 5\' 5"  (1.651 m)   Physical Exam  Constitutional: She is oriented to person, place, and time. No distress.  Cardiovascular: Normal rate, regular rhythm and normal heart sounds.  Exam reveals no gallop and no friction rub.   No murmur heard. Pulmonary/Chest: Effort normal and breath sounds normal. No respiratory distress. She has no wheezes. She has no rales. She exhibits no tenderness.  Abdominal: Soft. Bowel sounds are normal. She exhibits no distension and no mass. There is no tenderness. There is no rebound and no guarding.  Musculoskeletal:       Right knee: She exhibits no  effusion, no erythema, no LCL laxity and no MCL laxity. No tenderness found.       Left knee: She exhibits no swelling, no effusion and no erythema. No tenderness found.  Bilateral crepitus in patellofemoral joints  Neurological: She is alert and oriented to person, place, and time.  Skin: She is not diaphoretic.    Social History   Social History  . Marital status: Divorced    Spouse name: N/A  . Number of children: 3  . Years of education: some college   Occupational History  . Teacher    Social History Main Topics  . Smoking status: Former Smoker    Packs/day: 0.02    Types: Cigarettes    Quit date: 10/09/2016  . Smokeless tobacco: Never Used     Comment: Quit 10/2016  . Alcohol use No     Comment: Quit 10/2016  . Drug use: No  . Sexual activity: No   Other Topics Concern  . Not on file   Social History Narrative   Lives at home with son and grandson.   Right-handed.   Occasional caffeine use.    Family History  Problem Relation Age of Onset  . Hypertension Mother   . Diabetes Mother   . Breast cancer Mother   . Diabetes Sister   .  Hypertension Sister   . Other Father        accident    Assessment & Plan:   See Encounters Tab for problem based charting.  Patient seen with Dr. Evette Doffing

## 2017-06-10 NOTE — Assessment & Plan Note (Signed)
This is a chronic issue for the patient, she is trying to avoid knee replacement surgery.  She has had good results with knee injections in the past.  Her exam concurs with this diagnosis illustrated in bilateral crepitus and no ligament weakness or tears noted.    -Performed bilateral knee injection with steroid analgesic combination

## 2017-06-20 NOTE — Progress Notes (Signed)
Internal Medicine Clinic Attending  I saw and evaluated the patient.  I personally confirmed the key portions of the history and exam documented by Dr. Shan Levans and I reviewed pertinent patient test results.  The assessment, diagnosis, and plan were formulated together and I agree with the documentation in the resident's note. I was present for the entirety of the procedures.

## 2017-07-27 ENCOUNTER — Telehealth: Payer: Self-pay | Admitting: Internal Medicine

## 2017-08-10 ENCOUNTER — Ambulatory Visit: Payer: Medicare Other

## 2017-08-18 ENCOUNTER — Ambulatory Visit (INDEPENDENT_AMBULATORY_CARE_PROVIDER_SITE_OTHER): Payer: Medicare Other | Admitting: Internal Medicine

## 2017-08-18 ENCOUNTER — Other Ambulatory Visit: Payer: Self-pay

## 2017-08-18 VITALS — BP 142/73 | HR 98 | Temp 98.1°F | Ht 65.0 in | Wt 134.1 lb

## 2017-08-18 DIAGNOSIS — I10 Essential (primary) hypertension: Secondary | ICD-10-CM

## 2017-08-18 DIAGNOSIS — Z7982 Long term (current) use of aspirin: Secondary | ICD-10-CM

## 2017-08-18 DIAGNOSIS — F17211 Nicotine dependence, cigarettes, in remission: Secondary | ICD-10-CM | POA: Diagnosis not present

## 2017-08-18 DIAGNOSIS — Z79899 Other long term (current) drug therapy: Secondary | ICD-10-CM

## 2017-08-18 DIAGNOSIS — M170 Bilateral primary osteoarthritis of knee: Secondary | ICD-10-CM | POA: Diagnosis present

## 2017-08-18 NOTE — Progress Notes (Signed)
[facility] Subjective:  HPI: [name] is a 71 y.o. female who presents for b/l knee pain  Please see Assessment and Plan below for the status of her chronic medical problems.  Review of Systems: Review of Systems  Constitutional: Negative for chills and fever.  Cardiovascular: Negative for chest pain and leg swelling.  Musculoskeletal: Positive for joint pain.  Endo/Heme/Allergies: Does not bruise/bleed easily.    Objective:  Physical Exam: Vitals:   08/18/17 1502  BP: (!) 142/73  Pulse: 98  Temp: 98.1 F (36.7 C)  TempSrc: Oral  SpO2: 100%  Weight: 134 lb 1.6 oz (60.8 kg)  Height: 5\' 5"  (1.651 m)  Physical Exam  Constitutional: She is well-developed, well-nourished, and in no distress.  Musculoskeletal:       Right knee: She exhibits normal range of motion, no swelling and normal meniscus. Tenderness found. Medial joint line and lateral joint line tenderness noted.       Left knee: She exhibits normal range of motion, no swelling and normal meniscus. Tenderness found. Medial joint line and lateral joint line tenderness noted.  Nursing note and vitals reviewed.  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  Pain is usually achy and throbing. No fever or chills.   Joint injections have helped tremendously she is requesting another.  Last injection about 2.5 months ago.  We have been trying to limit to q3 months.  The reports pain is worse with colder weather and worried she would not be able to get in to see me next week with the snow. Has been taking increased Goody powerder for pain.  A: B/L OA of knees  P: D/C goody powder- likely driving up HTN -B/L steroid injection today  Essential hypertension HPI: BP above goal, asymptomatic, reports adherence to BP meds, increase stress from  children, increased GOODY powder consumption  A: Essential HTN  P: Stressed importance of BP control -D/C GOODY powder -continue amlodipine.  PROCEDURE NOTE  PROCEDURE: right knee joint steroid injection.  PREOPERATIVE DIAGNOSIS: Osteoarthritis of the bilateral knee.  POSTOPERATIVE DIAGNOSIS: Osteoarthritis of the bilateral knee.  PROCEDURE: The patient was apprised of the risks and the benefits of the procedure and informed consent was obtained, as witnessed by Lela. Time-out procedure was performed, with confirmation of the patient's name, date of birth, and correct identification of the right knee to be injected. The patient's knee was then marked at the appropriate site for injection placement. The knee was sterilely prepped with Betadine. A 40 mg (1 milliliter) solution of Kenalog was drawn up into a 5 mL syringe with a 2 mL of 1% lidocaine. The patient was injected with a 25-gauge needle at the anterior lateral aspect of her right flexed knee. There were no complications. The patient tolerated the procedure well. There was minimal bleeding. The patient was instructed to ice her knee upon leaving clinic and refrain from overuse over the next 3 days. The patient was instructed to go to the emergency room with any usual pain, swelling, or redness occurred in the injected area. The patient was given a followup appointment to evaluate response to the injection to his increased range of motion and reduction of pain.  PROCEDURE NOTE  PROCEDURE: left knee joint steroid injection.  PREOPERATIVE DIAGNOSIS: Osteoarthritis of the bilateral knee.  POSTOPERATIVE DIAGNOSIS: Osteoarthritis of the bilateral knee.  PROCEDURE: The patient was apprised of the risks and the benefits of the procedure and informed consent was obtained, as witnessed by lela. Time-out procedure was performed, with confirmation of the patient's name, date of birth, and correct identification of the left knee to be injected.  The patient's knee was then marked at the appropriate site for injection placement. The knee was sterilely prepped with Betadine. A 40 mg (1 milliliter) solution of Kenalog was drawn up into a 5 mL syringe with a 2 mL of 1% lidocaine. The patient was injected with a 25-gauge needle at the anterior lateral aspect of her left flexed knee. There were no complications. The patient tolerated the procedure well. There was minimal bleeding. The patient was instructed to ice her knee upon leaving clinic and refrain from overuse over the next 3 days. The patient was instructed to go to the emergency room with any usual pain, swelling, or redness occurred in the injected area. The patient was given a followup appointment to evaluate response to the injection to his increased range of motion and reduction of pain.   Medications Ordered No orders of the defined types were placed in this encounter.  Other Orders No orders of the defined types were placed in this encounter.  Follow Up: Return in about 3 months (around 11/16/2017).

## 2017-08-18 NOTE — Assessment & Plan Note (Signed)
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  Pain is usually achy and throbing. No fever or chills.   Joint injections have helped tremendously she is requesting another.  Last injection about 2.5 months ago.  We have been trying to limit to q3 months.  The reports pain is worse with colder weather and worried she would not be able to get in to see me next week with the snow. Has been taking increased Goody powerder for pain.  A: B/L OA of knees  P: D/C goody powder- likely driving up HTN -B/L steroid injection today

## 2017-08-18 NOTE — Assessment & Plan Note (Signed)
HPI: BP above goal, asymptomatic, reports adherence to BP meds, increase stress from children, increased GOODY powder consumption  A: Essential HTN  P: Stressed importance of BP control -D/C GOODY powder -continue amlodipine.

## 2017-09-08 ENCOUNTER — Ambulatory Visit: Payer: Medicare Other

## 2017-09-08 ENCOUNTER — Encounter: Payer: Self-pay | Admitting: Internal Medicine

## 2017-09-21 ENCOUNTER — Other Ambulatory Visit: Payer: Self-pay

## 2017-09-21 ENCOUNTER — Ambulatory Visit (INDEPENDENT_AMBULATORY_CARE_PROVIDER_SITE_OTHER): Payer: Medicare Other | Admitting: Internal Medicine

## 2017-09-21 VITALS — BP 144/76 | HR 101 | Temp 99.1°F | Wt 136.0 lb

## 2017-09-21 DIAGNOSIS — M170 Bilateral primary osteoarthritis of knee: Secondary | ICD-10-CM | POA: Diagnosis not present

## 2017-09-21 DIAGNOSIS — Z79891 Long term (current) use of opiate analgesic: Secondary | ICD-10-CM

## 2017-09-21 DIAGNOSIS — L814 Other melanin hyperpigmentation: Secondary | ICD-10-CM | POA: Diagnosis not present

## 2017-09-21 DIAGNOSIS — N898 Other specified noninflammatory disorders of vagina: Secondary | ICD-10-CM

## 2017-09-21 DIAGNOSIS — F17211 Nicotine dependence, cigarettes, in remission: Secondary | ICD-10-CM

## 2017-09-21 DIAGNOSIS — L309 Dermatitis, unspecified: Secondary | ICD-10-CM

## 2017-09-21 MED ORDER — ACETAMINOPHEN-CODEINE #3 300-30 MG PO TABS: 1.0000 | ORAL_TABLET | Freq: Every day | ORAL | 0 refills | Status: DC | PRN

## 2017-09-21 MED ORDER — CLOBETASOL PROPIONATE 0.05 % EX CREA: 1.0000 "application " | TOPICAL_CREAM | Freq: Two times a day (BID) | CUTANEOUS | 0 refills | Status: DC

## 2017-09-21 NOTE — Progress Notes (Signed)
   CC: Rash, medication refill   HPI:  Mercedes Gardner is a 72 y.o. F with a past medical history as described below who presents to the clinic with complaints of a rash and medication refill.   Medication refill: Pt has been using Tylenol #3 intermittently for bilateral osteoarthritis to her knees. She takes these on days with severe pain which improves the pain and allows her to go to work. She also receives steroid injections which help. She is trying to delay the need for knee replacement as long as possible.   Rash: The pt notes worsening of her chronic rash related to eczema. She was recently seen by Dermatology who recommended diprolene cream, though she states this has been ineffective for her areas of dry, itchy skin. She is self-conscious of these areas as she works as a Pharmacist, hospital and is worried parents and other staff will want her fired because of them. She is requesting a different cream that she used in the past that was effective.   Vaginal Irritation: Notes vaginal irritation several weeks ago after she changed soaps. She denied vaginal discharge, and states she stopped using the soap and has been using only plain water for hygiene which has led to complete resolution of the sx and denies issues currently.   Past Medical History:  Diagnosis Date  . Anemia   . Anxiety   . Arthritis    knees   . Dysrhythmia    heart skips a beat   . GERD (gastroesophageal reflux disease)   . Gout   . Hyperlipidemia April 2014  . Hypertension   . Ovarian cyst    removed during mini lap 2016, benign path, Dr Denman George  . Shortness of breath dyspnea    due to pressure of cyst per patient   . Stroke ([place])   . 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 TIA (transient ischemic attack)    Review of Systems:  Review of Systems  Genitourinary: Negative.   Skin: Positive for itching and rash.     Physical Exam:  Vitals:   09/21/17 1449  BP: (!) 144/76  Pulse: (!) 101  Temp: 99.1  F (37.3 C)  TempSrc: Oral  SpO2: 100%  Weight: 136 lb (61.7 kg)   General: Elderly female sitting in chair comfortably, no acute distress CV: RRR at time of exam  Resp: Clear breath sounds bilaterally, normal work of breathing, no distress  Extr: Crepitus to bilateral knees, no significant TTP of jointlines  Neuro: Alert and oriented x3 Skin: Warm. Areas of dry, hyperpigmented skin (c/w with prior exams based on descriptions), primarily on volar forearms, no tenderness, blistering, or drainage       Assessment & Plan:   See Encounters Tab for problem based charting.  Patient discussed with Dr. Evette Doffing

## 2017-09-21 NOTE — Patient Instructions (Addendum)
Nice to meet you today Ms. Toothaker.  We refilled your Tylenol 3 for your knee pain.  We would recommend following up with her dermatologist so that he can reevaluate the skin lesions and see if he needed a stronger steroid cream.  The spots do not look like an infection and so the second type of cream that he used to use probably would not help on its own. We will send for a refill of the Clobetasol cream that worked in the past but still recommend to get another appointment with your Dermatologist.   Dr. Allyn Kenner, Dermatology: [phone]

## 2017-09-22 ENCOUNTER — Other Ambulatory Visit: Payer: Self-pay | Admitting: *Deleted

## 2017-09-22 NOTE — Assessment & Plan Note (Addendum)
Patient has a history of bilateral osteoarthritis being managed with steroid joint injections and acetaminophen-codeine 300-30 mg as needed for severe pain.  She was prescribed 30 tablets with 3 refills in July which has lasted roughly 5-6 months, no red flags for abuse.   --Refill acetaminophen-codeine 300-30 mg daily prn #30, 0 refills --F/u with PCP ~ March (would be 3-4 months from last steroid injection)

## 2017-09-22 NOTE — Assessment & Plan Note (Signed)
She is experienced a recent worsening of eczema symptoms despite use of betamethasone Diprolene cream.  She is requesting to creams that she used in the past which improved her rash.  On chart review, it appears the patient has used clobetasol in combination with clotrimazole, though the clotrimazole component has been unnecessary.  She was again counseled that she does not have a fungal component of her rash and this medication is unnecessary.  However, we will increase to a clobetasol cream in an attempt to improve her symptoms.  She was encouraged to follow-up with dermatology as necessary should more complex adjustments be needed.  --Clobetasol 0.05% cream bid

## 2017-09-22 NOTE — Progress Notes (Signed)
Internal Medicine Clinic Attending  Case discussed with Dr. Harden at the time of the visit.  We reviewed the resident's history and exam and pertinent patient test results.  I agree with the assessment, diagnosis, and plan of care documented in the resident's note.  

## 2017-09-27 ENCOUNTER — Telehealth: Payer: Self-pay | Admitting: Internal Medicine

## 2017-09-27 NOTE — Telephone Encounter (Signed)
Patient is calling to if itching medicine was approve

## 2017-09-27 NOTE — Telephone Encounter (Addendum)
Call to Pharmacy although patient has Medicaid.  Medicaid will not pay for meds denied by [employer]..  Call to [employer] will need to do PA.  Information was sent from office here.  Information was completed by Dr. Johny Chess and faxed to [facility].  Sander Nephew, RN 09/27/2017 3:15 PM.  Call to [facility] to check on status of PA request.  Information was received .  Transferred to pharmacy to check on status.  No determination will be made until 09/30/2017.  It takes 72 hours for review.  Answer to be faxed on 09/30/2017.  Sander Nephew, RN 09/28/2017 9:16 AM.

## 2017-09-27 NOTE — Telephone Encounter (Signed)
States she needs PA for clobetasol cream. Message sent to [facility].

## 2017-10-08 ENCOUNTER — Ambulatory Visit (HOSPITAL_COMMUNITY)
Admission: EM | Admit: 2017-10-08 | Discharge: 2017-10-08 | Disposition: A | Discharge status: Home / Self Care | Payer: Medicare Other | Attending: Family Medicine | Admitting: Family Medicine

## 2017-10-08 ENCOUNTER — Encounter (HOSPITAL_COMMUNITY): Payer: Self-pay | Admitting: Family Medicine

## 2017-10-08 DIAGNOSIS — D649 Anemia, unspecified: Secondary | ICD-10-CM | POA: Insufficient documentation

## 2017-10-08 DIAGNOSIS — E785 Hyperlipidemia, unspecified: Secondary | ICD-10-CM | POA: Diagnosis not present

## 2017-10-08 DIAGNOSIS — K439 Ventral hernia without obstruction or gangrene: Secondary | ICD-10-CM | POA: Diagnosis not present

## 2017-10-08 DIAGNOSIS — G8929 Other chronic pain: Secondary | ICD-10-CM | POA: Insufficient documentation

## 2017-10-08 DIAGNOSIS — Z87891 Personal history of nicotine dependence: Secondary | ICD-10-CM | POA: Insufficient documentation

## 2017-10-08 DIAGNOSIS — M25561 Pain in right knee: Secondary | ICD-10-CM

## 2017-10-08 DIAGNOSIS — I10 Essential (primary) hypertension: Secondary | ICD-10-CM | POA: Diagnosis not present

## 2017-10-08 DIAGNOSIS — M170 Bilateral primary osteoarthritis of knee: Secondary | ICD-10-CM | POA: Insufficient documentation

## 2017-10-08 DIAGNOSIS — R21 Rash and other nonspecific skin eruption: Secondary | ICD-10-CM | POA: Diagnosis present

## 2017-10-08 DIAGNOSIS — Z8673 Personal history of transient ischemic attack (TIA), and cerebral infarction without residual deficits: Secondary | ICD-10-CM | POA: Diagnosis not present

## 2017-10-08 DIAGNOSIS — M109 Gout, unspecified: Secondary | ICD-10-CM | POA: Diagnosis not present

## 2017-10-08 DIAGNOSIS — Z7982 Long term (current) use of aspirin: Secondary | ICD-10-CM | POA: Insufficient documentation

## 2017-10-08 DIAGNOSIS — Z9889 Other specified postprocedural states: Secondary | ICD-10-CM | POA: Diagnosis not present

## 2017-10-08 DIAGNOSIS — K219 Gastro-esophageal reflux disease without esophagitis: Secondary | ICD-10-CM | POA: Insufficient documentation

## 2017-10-08 DIAGNOSIS — M25562 Pain in left knee: Secondary | ICD-10-CM | POA: Diagnosis not present

## 2017-10-08 DIAGNOSIS — F419 Anxiety disorder, unspecified: Secondary | ICD-10-CM | POA: Diagnosis not present

## 2017-10-08 DIAGNOSIS — R3989 Other symptoms and signs involving the genitourinary system: Secondary | ICD-10-CM | POA: Diagnosis not present

## 2017-10-08 DIAGNOSIS — Z888 Allergy status to other drugs, medicaments and biological substances status: Secondary | ICD-10-CM | POA: Insufficient documentation

## 2017-10-08 DIAGNOSIS — Z79899 Other long term (current) drug therapy: Secondary | ICD-10-CM | POA: Insufficient documentation

## 2017-10-08 LAB — POCT URINALYSIS DIP (DEVICE)
Bilirubin Urine: NEGATIVE
Glucose, UA: NEGATIVE mg/dL
Ketones, ur: NEGATIVE mg/dL
Leukocytes, UA: NEGATIVE
NITRITE: NEGATIVE
PH: 5.5 (ref 5.0–8.0)
PROTEIN: NEGATIVE mg/dL
Specific Gravity, Urine: 1.01 (ref 1.005–1.030)
Urobilinogen, UA: 0.2 mg/dL (ref 0.0–1.0)

## 2017-10-08 MED ORDER — CLOTRIMAZOLE-BETAMETHASONE 1-0.05 % EX CREA: TOPICAL_CREAM | CUTANEOUS | 0 refills | Status: DC

## 2017-10-08 MED ORDER — DICLOFENAC SODIUM 1 % TD GEL: 2.0000 g | Freq: Four times a day (QID) | TRANSDERMAL | 0 refills | Status: AC

## 2017-10-08 NOTE — Discharge Instructions (Signed)
Follow up with your PCP if your skin does not get better.  OK to take Tylenol 1000 mg (2 extra strength tabs) or 975 mg (3 regular strength tabs) every 6 hours as needed.  Ice/cold pack over area for 10-15 min every 2-3 hours while awake.  If you don't hear anything about your urine testing, assume things are normal.

## 2017-10-08 NOTE — ED Triage Notes (Signed)
Rash- pt here for rash to that is spreading throughout her body. She is using clobetasol cream with no relief. sts that she has had fungus cream and steroid cream in the past with some relief. She has had the rash for about a year.   Vaginal irritation- 2 weeks of vaginal irritation. Denies any urinary symptoms. sts that it might be her soap.   Knee pain-she is here for chronic knee pain and needs refill on a "pain cream" that she used in the past.

## 2017-10-08 NOTE — ED Provider Notes (Signed)
[name]    CSN: [id number] Arrival date & time: 10/08/17  1205     History   Chief Complaint Chief Complaint  Patient presents with  . Rash    HPI Mercedes Gardner is a 72 y.o. female.   HPI  Rash- all over body, had PO steroid and antifungal in past that was helpful. No sick contacts, no pain or drainage.   Vaginal irritation-she has been having some irritation in her vaginal region.  She is wondering if she has a urinary tract infection or yeast infection.  She has had these in the past but it does not feel exactly similar.  She would like a urine to be checked just in case.  No new sexual partners or pain.  No bleeding.  Knee pain- chronic knee pain, requesting refill of topical cream. Hx of injections. No recent injury or change in activity. No swelling or fevers.   Past Medical History:  Diagnosis Date  . Anemia   . Anxiety   . Arthritis    knees   . Dysrhythmia    heart skips a beat   . GERD (gastroesophageal reflux disease)   . Gout   . Hyperlipidemia April 2014  . Hypertension   . Ovarian cyst    removed during mini lap 2016, benign path, Dr Denman George  . Shortness of breath dyspnea    due to pressure of cyst per patient   . Stroke ([place])   . 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 TIA (transient ischemic attack)     Patient Active Problem List   Diagnosis Date Noted  . Tuberculosis screening 06/08/2017  . Generalized anxiety disorder 03/24/2017  . Eczema of both upper extremities 03/24/2017  . Ventral hernia without obstruction or gangrene 03/23/2017  . Healthcare maintenance 12/07/2016  . Incidental lung nodule, greater than or equal to 47m   . History of stroke   . Normocytic anemia 10/11/2016  . Personal history of gout 12/24/2008  . ALLERGIC RHINITIS 12/20/2008  . Osteoarthritis of both knees 12/20/2008  . TOBACCO ABUSE 07/29/2006  . Essential hypertension 07/29/2006    Past Surgical History:  Procedure Laterality Date    . CESAREAN SECTION     x3  . IR GENERIC HISTORICAL  11/30/2016   IR ANGIO VERTEBRAL SEL VERTEBRAL UNI L MOD SED 11/30/2016 SLuanne Bras MD [facility]  . IR GENERIC HISTORICAL  11/30/2016   IR ANGIO INTRA EXTRACRAN SEL COM CAROTID INNOMINATE BILAT MOD SED 11/30/2016 SLuanne Bras MD [facility]  . IR GENERIC HISTORICAL  11/30/2016   IR ANGIO VERTEBRAL SEL SUBCLAVIAN INNOMINATE UNI R MOD SED 11/30/2016 SLuanne Bras MD [facility]  . OVARIAN CYST SURGERY Right 1980's  . SALPINGOOPHORECTOMY Bilateral 02/06/2015   Procedure: ECampbell StallLAPAROTOMY/BILATERAL SALPINGO OOPHORECTOMY;  Surgeon: EEveritt Amber MD;  Location: [facility];  Service: Gynecology;  Laterality: Bilateral;    OB History    Gravida Para Term Preterm AB Living   _0 SAB TAB Ectopic Multiple Live Births                   Home Medications    Prior to Admission medications   Medication Sig Start Date End Date Taking? Authorizing Provider  acetaminophen-codeine (TYLENOL #3) 300-30 MG tablet Take 1 tablet by mouth daily as needed (pain). 09/21/17   HTawny Asal MD  amLODipine (NORVASC) 10 MG tablet take 1 tablet by mouth  once daily 12/27/16   Lucious Groves, DO  aspirin EC 81 MG tablet Take 1 tablet (81 mg total) by mouth daily. 03/24/17 03/24/18  Lucious Groves, DO  busPIRone (BUSPAR) 7.5 MG tablet Take 1 tablet (7.5 mg total) by mouth 2 (two) times daily. 03/24/17   Lucious Groves, DO  clopidogrel (PLAVIX) 75 MG tablet take 1 tablet by mouth once daily 01/14/17   Lucious Groves, DO  clotrimazole-betamethasone (LOTRISONE) cream Apply to affected area 2 times daily prn 10/08/17   Wendling, Crosby Oyster, DO  COLCRYS 0.6 MG tablet Take 0.6 mg by mouth 2 (two) times daily. 04/14/17   [provider]  conjugated estrogens (PREMARIN) vaginal cream Place 1 Applicatorful vaginally daily. 02/22/17   Rice, Resa Miner, MD  diclofenac sodium (VOLTAREN) 1 % GEL Apply 2 g topically 4 (four) times daily.  10/08/17   Shelda Pal, DO  ferrous sulfate 325 (65 FE) MG tablet Take 1 tablet (325 mg total) by mouth 2 (two) times daily with a meal. 10/22/16   Angiulli, Lavon Paganini, PA-C  fluticasone (FLONASE) 50 MCG/ACT nasal spray Place 2 sprays into both nostrils as needed.  04/17/14   Presson, Audelia Hives, PA  folic acid (FOLVITE) 1 MG tablet Take 1 tablet (1 mg total) by mouth daily. 10/22/16   Angiulli, Lavon Paganini, PA-C  loratadine (CLARITIN) 10 MG tablet  05/29/17   [provider]  meclizine (ANTIVERT) 25 MG tablet Take 1/2-1 tablet up to 3 times a day as needed for dizziness may calls some drowsiness. 04/26/17   Janne Napoleon, NP  Multiple Vitamin (MULTIVITAMIN WITH MINERALS) TABS tablet Take 1 tablet by mouth daily. 10/22/16   Angiulli, Lavon Paganini, PA-C  RA ASPIRIN EC 325 MG EC tablet  04/14/17   [provider]  rosuvastatin (CRESTOR) 20 MG tablet Take 1 tablet (20 mg total) by mouth daily. 03/24/17   Lucious Groves, DO  Tetrahydroz-Dextran-PEG-Povid 0.05-0.1-1-1 % SOLN Place 1 drop into both eyes daily as needed (dry eyes). Generic OTC eye drops    [provider]    Family History Family History  Problem Relation Age of Onset  . Hypertension Mother   . Diabetes Mother   . Breast cancer Mother   . Diabetes Sister   . Hypertension Sister   . Other Father        accident    Social History Social History   Tobacco Use  . Smoking status: Former Smoker    Packs/day: 0.02    Types: Cigarettes    Last attempt to quit: 10/09/2016    Years since quitting: 0.9  . Smokeless tobacco: Never Used  . Tobacco comment: Quit 10/2016  Substance Use Topics  . Alcohol use: No    Comment: Quit 10/2016  . Drug use: No     Allergies   Tramadol and Topiramate   Review of Systems Review of Systems  Constitutional: Negative for fever.  Skin: Positive for rash.     Physical Exam Triage Vital Signs ED Triage Vitals [10/08/17 1233]  Enc Vitals Group     BP 131/71      Pulse Rate 88     Resp 18     Temp 98.5 F (36.9 C)     Temp src      SpO2 100 %   Updated Vital Signs BP 131/71   Pulse 88   Temp 98.5 F (36.9 C)   Resp 18   SpO2 100%  Physical Exam  Constitutional: She is oriented to person, place, and time. She appears well-developed and well-nourished.  HENT:  Head: Normocephalic and atraumatic.  Eyes: EOM are normal. Pupils are equal, round, and reactive to light.  Cardiovascular: Normal rate.  Pulmonary/Chest: Effort normal and breath sounds normal.  Neurological: She is alert and oriented to person, place, and time. Coordination normal.  Skin: Skin is warm and dry.  Over her forearms and middle of her back, there are circular hyperpigmented and scaly lesions.  There is no erythema, drainage, or fluctuance.  Psychiatric: She has a normal mood and affect. Judgment normal.     UC Treatments / Results  Labs (all labs ordered are listed, but only abnormal results are displayed) Labs Reviewed  POCT URINALYSIS DIP (DEVICE) - Abnormal; Notable for the following components:      Result Value   Hgb urine dipstick TRACE (*)    All other components within normal limits  URINE CYTOLOGY ANCILLARY ONLY    Procedures Procedures none  Initial Impression / Assessment and Plan / UC Course  I have reviewed the triage vital signs and the nursing notes.  Pertinent labs & imaging results that were available during my care of the patient were reviewed by me and considered in my medical decision making (see chart for details).     72 year old female presents with a myriad of complaints.  We will call in a combination steroid and antifungal for her rash as this is apparently worked for her in the past.  If this does not help, she needs a follow-up with her primary care physician.  Will refill Voltaren gel for her knees.  Her UA is unremarkable.  Will send for ancillary.  I let her know that if we did not contact her, assume everything is normal.   She is to follow-up with her primary care physician otherwise.  She voiced understanding and agreement the plan.  Final Clinical Impressions(s) / UC Diagnoses   Final diagnoses:  Rash  Chronic pain of both knees  Urinary problem    ED Discharge Orders        Ordered    clotrimazole-betamethasone (LOTRISONE) cream     10/08/17 1245    diclofenac sodium (VOLTAREN) 1 % GEL  4 times daily     10/08/17 1245       Controlled Substance Prescriptions [place] Controlled Substance Registry consulted? Not Applicable   Shelda Pal, [place] 10/08/17 1335

## 2017-10-10 LAB — URINE CYTOLOGY ANCILLARY ONLY
Chlamydia: NEGATIVE
Neisseria Gonorrhea: NEGATIVE
TRICH (WINDOWPATH): NEGATIVE

## 2017-10-13 LAB — URINE CYTOLOGY ANCILLARY ONLY
Bacterial vaginitis: NEGATIVE
CANDIDA VAGINITIS: NEGATIVE

## 2017-10-23 ENCOUNTER — Ambulatory Visit (HOSPITAL_COMMUNITY)
Admission: EM | Admit: 2017-10-23 | Discharge: 2017-10-23 | Disposition: A | Discharge status: Home / Self Care | Payer: Medicare Other | Attending: Internal Medicine | Admitting: Internal Medicine

## 2017-10-23 ENCOUNTER — Encounter (HOSPITAL_COMMUNITY): Payer: Self-pay

## 2017-10-23 DIAGNOSIS — Z833 Family history of diabetes mellitus: Secondary | ICD-10-CM | POA: Insufficient documentation

## 2017-10-23 DIAGNOSIS — G8929 Other chronic pain: Secondary | ICD-10-CM

## 2017-10-23 DIAGNOSIS — Z888 Allergy status to other drugs, medicaments and biological substances status: Secondary | ICD-10-CM | POA: Diagnosis not present

## 2017-10-23 DIAGNOSIS — M109 Gout, unspecified: Secondary | ICD-10-CM | POA: Insufficient documentation

## 2017-10-23 DIAGNOSIS — R21 Rash and other nonspecific skin eruption: Secondary | ICD-10-CM | POA: Diagnosis not present

## 2017-10-23 DIAGNOSIS — I10 Essential (primary) hypertension: Secondary | ICD-10-CM | POA: Insufficient documentation

## 2017-10-23 DIAGNOSIS — K219 Gastro-esophageal reflux disease without esophagitis: Secondary | ICD-10-CM | POA: Insufficient documentation

## 2017-10-23 DIAGNOSIS — Z803 Family history of malignant neoplasm of breast: Secondary | ICD-10-CM | POA: Diagnosis not present

## 2017-10-23 DIAGNOSIS — M25562 Pain in left knee: Secondary | ICD-10-CM | POA: Insufficient documentation

## 2017-10-23 DIAGNOSIS — Z8249 Family history of ischemic heart disease and other diseases of the circulatory system: Secondary | ICD-10-CM | POA: Insufficient documentation

## 2017-10-23 DIAGNOSIS — Z87891 Personal history of nicotine dependence: Secondary | ICD-10-CM | POA: Diagnosis not present

## 2017-10-23 DIAGNOSIS — M25561 Pain in right knee: Secondary | ICD-10-CM | POA: Insufficient documentation

## 2017-10-23 DIAGNOSIS — N390 Urinary tract infection, site not specified: Secondary | ICD-10-CM | POA: Insufficient documentation

## 2017-10-23 DIAGNOSIS — Z8673 Personal history of transient ischemic attack (TIA), and cerebral infarction without residual deficits: Secondary | ICD-10-CM | POA: Diagnosis not present

## 2017-10-23 DIAGNOSIS — E785 Hyperlipidemia, unspecified: Secondary | ICD-10-CM | POA: Diagnosis not present

## 2017-10-23 LAB — POCT URINALYSIS DIP (DEVICE)
BILIRUBIN URINE: NEGATIVE
GLUCOSE, UA: NEGATIVE mg/dL
Ketones, ur: NEGATIVE mg/dL
NITRITE: NEGATIVE
PH: 6 (ref 5.0–8.0)
Protein, ur: NEGATIVE mg/dL
Specific Gravity, Urine: <=1.005 (ref 1.005–1.030)
Urobilinogen, UA: 0.2 mg/dL (ref 0.0–1.0)

## 2017-10-23 MED ORDER — CLOTRIMAZOLE-BETAMETHASONE 1-0.05 % EX CREA: TOPICAL_CREAM | CUTANEOUS | 1 refills | Status: AC

## 2017-10-23 MED ORDER — CLOTRIMAZOLE-BETAMETHASONE 1-0.05 % EX CREA: TOPICAL_CREAM | CUTANEOUS | 0 refills | Status: DC

## 2017-10-23 MED ORDER — CEPHALEXIN 500 MG PO CAPS: 500.0000 mg | ORAL_CAPSULE | Freq: Three times a day (TID) | ORAL | 0 refills | Status: AC

## 2017-10-23 NOTE — Discharge Instructions (Addendum)
For your knee pain please take Tylenol 1000 mg every 8 hours.  Please go back and see your primary care provider with regard to knee injections.  Your urine does appear infected today.  I have prescribed Keflex 500 mg 3 times a day for 5 days.  I will send a culture of your urine to find out which organism is infecting your bladder.  I have refilled your cream and provided you with some refills.  Please keep your appointment with your PCP.

## 2017-10-23 NOTE — ED Triage Notes (Signed)
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 Also states she has a uti. Complains of cloudy urine and flank pain for about 5 days, no dysuria but frequency. Said she gets injections in both her knees and wanted to see if we could do that for her today as well. I told her we normally don't but depends on the provider in the clinic.

## 2017-10-23 NOTE — ED Provider Notes (Addendum)
10/23/2017 12:40 PM   DOB: 06-21-1946 / MRN: [id number]  SUBJECTIVE:  Mercedes Gardner is a 72 y.o. female presenting for multiple complaints.  She wants injections in her knees today.  Advised that we will have to table this complaint for an isolated visit.  She denies redness and swelling about the knees today.  She would like a cream for the rash on her arms.  She tells me she is having cloudy urine, flank pain, however denies dysuria frequency.  She has no penicillin allergy.  She has a history of normal renal function.    She is allergic to tramadol and topiramate.   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    She  reports that she quit smoking about a year ago. Her smoking use included cigarettes. She smoked 0.02 packs per day. she has never used smokeless tobacco. She reports that she does not drink alcohol or use drugs. She  reports that she does not engage in sexual activity. The patient  has a past surgical history that includes Cesarean section; Ovarian cyst surgery (Right, 1980's); Salpingoophorectomy (Bilateral, 02/06/2015); ir generic historical (11/30/2016); ir generic historical (11/30/2016); and ir generic historical (11/30/2016).  Her family history includes Breast cancer in her mother; Diabetes in her mother and sister; Hypertension in her mother and sister; Other in her father.  Review of Systems  Constitutional: Negative for chills, diaphoresis and fever.  Eyes: Negative.   Respiratory: Negative for cough, hemoptysis, sputum production, shortness of breath and wheezing.   Cardiovascular: Negative for chest pain, orthopnea and leg swelling.  Gastrointestinal: Negative for abdominal pain, blood in stool,  constipation, diarrhea, heartburn, melena, nausea and vomiting.  Genitourinary: Positive for frequency and urgency. Negative for dysuria and hematuria.  Skin: Positive for itching and rash.  Neurological: Negative for dizziness, sensory change, speech change, focal weakness and headaches.    OBJECTIVE:  BP 120/82 (BP Location: Left Arm)   Pulse 95   Temp 98 F (36.7 C) (Oral)   Resp 18   SpO2 96%   Physical Exam  Constitutional: She is active.  Non-toxic appearance.  Cardiovascular: Normal rate, regular rhythm, S1 normal, S2 normal, normal heart sounds and intact distal pulses. Exam reveals no gallop, no friction rub and no decreased pulses.  No murmur heard. Pulmonary/Chest: Effort normal. No stridor. No tachypnea. No respiratory distress. She has no wheezes. She has no rales.  Abdominal: Soft. Bowel sounds are normal. She exhibits no distension and no mass. There is no tenderness. There is no rebound and no guarding.  Negative for flank tenderness  Musculoskeletal: Normal range of motion. She exhibits no edema, tenderness or deformity.  Bilateral knees negative for erythema, swelling, warmth.  Neurological: She is alert.  Skin: Skin is warm and dry. Rash (Generalized maculopapular plaque-like rash with silvery scale.  Most prominent about the anterior forearms.  Negative for tenderness, erythema, warmth, induration, fluctuance.) noted. She is not diaphoretic. No pallor.    Results for orders placed or performed during the hospital encounter of 10/23/17 (from the past 72 hour(s))  POCT urinalysis dip (device)     Status: Abnormal   Collection Time: 10/23/17 12:29 PM  Result Value Ref Range   Glucose, UA NEGATIVE NEGATIVE mg/dL   Bilirubin Urine NEGATIVE NEGATIVE   Ketones, ur  NEGATIVE NEGATIVE mg/dL   Specific Gravity, Urine <=1.005 1.005 - 1.030   Hgb urine dipstick MODERATE (A) NEGATIVE   pH 6.0 5.0 - 8.0   Protein, ur NEGATIVE NEGATIVE mg/dL   Urobilinogen, UA 0.2 0.0 - 1.0  mg/dL   Nitrite NEGATIVE NEGATIVE   Leukocytes, UA MODERATE (A) NEGATIVE    Comment: Biochemical Testing Only. Please order routine urinalysis from main lab if confirmatory testing is needed.   Lab Results  Component Value Date   CREATININE 0.80 11/29/2016   BUN 23 (H) 11/29/2016   NA 144 11/29/2016   K 3.8 11/29/2016   CL 107 11/29/2016   CO2 25 11/29/2016    No results found.  ASSESSMENT AND PLAN:  Orders Placed This Encounter  Procedures  . Urine culture    Standing Status:   Standing    Number of Occurrences:   1  . POCT urinalysis dip (device)    Standing Status:   Standing    Number of Occurrences:   1     Lower urinary tract infectious disease: Starting Keflex.  No flank pain on exam.  Vitals within normal limits.  Rash: Chronic.  Refilling her cream and she has an appointment with her PCP.  Chronic pain of both knees: Advised Tylenol.  She will need to see her PCP regarding the injections.      The patient is advised to call or return to clinic if she does not see an improvement in symptoms, or to seek the care of the closest emergency department if she worsens with the above plan.   Philis Fendt, MHS, PA-C 10/23/2017 12:40 PM    Tereasa Coop, PA-C 10/23/17 1245    Tereasa Coop, PA-C 10/23/17 1248

## 2017-10-25 LAB — URINE CULTURE: Culture: 40000 — AB

## 2017-10-26 ENCOUNTER — Other Ambulatory Visit: Payer: Self-pay | Admitting: Internal Medicine

## 2017-10-26 MED ORDER — COLCRYS 0.6 MG PO TABS: 0.6000 mg | ORAL_TABLET | Freq: Two times a day (BID) | ORAL | 1 refills | Status: AC

## 2017-10-26 MED ORDER — BUSPIRONE HCL 7.5 MG PO TABS: 7.5000 mg | ORAL_TABLET | Freq: Two times a day (BID) | ORAL | 2 refills | Status: AC

## 2017-10-26 NOTE — Telephone Encounter (Signed)
Patient is requesting refills buspirone & gout medicine

## 2017-10-27 ENCOUNTER — Telehealth (HOSPITAL_COMMUNITY): Payer: Self-pay | Admitting: Emergency Medicine

## 2017-10-27 MED ORDER — FLUCONAZOLE 150 MG PO TABS: 150.0000 mg | ORAL_TABLET | Freq: Every day | ORAL | 0 refills | Status: DC

## 2017-10-27 NOTE — Telephone Encounter (Signed)
Called pt to notify of recent lab results.... Pt ID'd properly Reports feeling better but believes she has now developed a yeast inf Wants to know if we can call Diflucan in to [facility] ([address]) Per [facility], ok to call in Diflucan 150 mg #1 no refills.  Pt is taking/tolarating well meds given at visit.  Adv pt if sx are not getting better to return or to f/u w/PCP Notified pt that lab results can be obtained through MyChart Pt verb understanding.

## 2017-10-27 NOTE — Telephone Encounter (Signed)
-----   Message from Wynona Luna, MD sent at 10/25/2017  2:53 PM EST ----- Clinical staff, please let patient know that urine culture was positive for a small number of E col germ, not quite meeting criteria for a UTI.  The germ is sensitive to cephalexin prescription given at the urgent care visit.  Recheck or followup with PCP for further evaluation if symptoms are not improving.  LM

## 2017-11-08 ENCOUNTER — Telehealth: Payer: Self-pay | Admitting: *Deleted

## 2017-11-08 NOTE — Telephone Encounter (Signed)
Fax from [facility] - requesting refill on Acetaminophen/Codeine #3 take one tab daily as needed for pain, last refill 09/21/17. Not on current med list; discontinued 2/10 in ED. Thanks

## 2017-11-10 ENCOUNTER — Other Ambulatory Visit: Payer: Self-pay | Admitting: Internal Medicine

## 2017-11-10 DIAGNOSIS — E2839 Other primary ovarian failure: Secondary | ICD-10-CM

## 2017-11-10 NOTE — Telephone Encounter (Signed)
Reviewed [place]- patient filled Rx for Acetaminophen/codeine #3 from Dr Jeanie Cooks at [facility] on 2/26.  Will not provide Rx.   Holley Raring would you mind calling to see if she has decided to switch PCP to Dr Jeanie Cooks?

## 2017-11-10 NOTE — Telephone Encounter (Signed)
Called pt - not at home per her daughter.

## 2017-11-11 NOTE — Telephone Encounter (Signed)
Called pt again - no answer; left message for a return call.

## 2017-11-14 NOTE — Telephone Encounter (Signed)
Called pt again - no answer; message left.

## 2017-11-18 ENCOUNTER — Other Ambulatory Visit: Payer: Medicare Other

## 2017-11-21 NOTE — Telephone Encounter (Signed)
I will route this over to Dr Lynnae January to see if we need to send a letter.

## 2017-11-22 NOTE — Telephone Encounter (Signed)
She has appt dr Heber Norvelt 4/11.If she shows up, we can then question her why going to 2 different clinics.  If no shows, will be able to dismiss as has received letter #1 and letter #2. Obviously, do not give her any more tylenol #3.

## 2017-11-29 ENCOUNTER — Encounter (INDEPENDENT_AMBULATORY_CARE_PROVIDER_SITE_OTHER): Payer: Self-pay | Admitting: Physician Assistant

## 2017-11-29 ENCOUNTER — Ambulatory Visit (INDEPENDENT_AMBULATORY_CARE_PROVIDER_SITE_OTHER): Payer: Medicare Other | Admitting: Physician Assistant

## 2017-11-29 ENCOUNTER — Ambulatory Visit (INDEPENDENT_AMBULATORY_CARE_PROVIDER_SITE_OTHER): Payer: Medicare Other

## 2017-11-29 DIAGNOSIS — M170 Bilateral primary osteoarthritis of knee: Secondary | ICD-10-CM

## 2017-11-29 DIAGNOSIS — G8929 Other chronic pain: Secondary | ICD-10-CM

## 2017-11-29 DIAGNOSIS — M25561 Pain in right knee: Secondary | ICD-10-CM

## 2017-11-29 DIAGNOSIS — M25562 Pain in left knee: Secondary | ICD-10-CM

## 2017-11-29 MED ORDER — LIDOCAINE HCL 1 % IJ SOLN
0.5000 mL | INTRAMUSCULAR | Status: AC | PRN
  Administered 2017-11-29: .5 mL

## 2017-11-29 MED ORDER — METHYLPREDNISOLONE ACETATE 40 MG/ML IJ SUSP
40.0000 mg | INTRAMUSCULAR | Status: AC | PRN
  Administered 2017-11-29: 40 mg via INTRA_ARTICULAR

## 2017-11-29 MED ORDER — IBUPROFEN 400 MG PO TABS: 400.0000 mg | ORAL_TABLET | Freq: Four times a day (QID) | ORAL | 0 refills | Status: DC | PRN

## 2017-11-29 NOTE — Progress Notes (Signed)
Office Visit Note   Patient: Mercedes Gardner           Date of Birth: 1945/10/26           MRN: [id number] Visit Date: 11/29/2017              Requested by: Lucious Groves, DO [id number]  [place], [place] [address] PCP: Lucious Groves, DO   Assessment & Plan: Visit Diagnoses:  1. Primary osteoarthritis of both knees     Plan: Spoke with her about knee friendly exercises.  She is given bilateral pullover knee sleeves at her request.  She understands that she can have cortisone injections in both knees no more frequent than every 3 months.  Questions encouraged and answered at length.  Follow-Up Instructions: No Follow-up on file.   Orders:  Orders Placed This Encounter  Procedures  . Large Joint Inj: bilateral knee  . XR Knee 1-2 Views Left  . XR Knee 1-2 Views Right   Meds ordered this encounter  Medications  . ibuprofen (ADVIL,MOTRIN) 400 MG tablet    Sig: Take 1 tablet (400 mg total) by mouth every 6 (six) hours as needed.    Dispense:  60 tablet    Refill:  0      Procedures: No procedures performed   Clinical Data: No additional findings.   Subjective: Chief Complaint  Patient presents with  . Left Knee - Pain    HPI Mercedes Gardner is a 72 year old female comes in today with bilateral knee pain.  She has known arthritis of both knees.  She was last seen in 2017 in our office.  Since then she has received cortisone injections elsewhere and states that have not worked as well for the injections did here in the past.  She did have a mechanical fall the other day and fell onto her left knee and has had increased left knee pain since that time.  She describes no mechanical symptoms of either knee. Review of Systems Please see HPI otherwise negative  Objective: Vital Signs: There were no vitals taken for this visit.  Physical Exam  Constitutional: She is oriented to person, place, and time. She appears well-developed and well-nourished. No distress.    Pulmonary/Chest: Effort normal.  Neurological: She is alert and oriented to person, place, and time.  Skin: She is not diaphoretic.  Psychiatric: She has a normal mood and affect.    Ortho Exam Bilateral knees no effusion abnormal warmth erythema.  Left knee tenderness along medial joint line.  Right knee nontender.  No instability valgus varus stressing both knees.  Varus deformities of both knees.  Good range of motion of both knees with patellofemoral crepitus bilaterally. Specialty Comments:  No specialty comments available.  Imaging: Xr Knee 1-2 Views Left  Result Date: 11/29/2017 Left knee AP lateral views: No subluxation dislocation.  No acute fracture.  Moderate medial compartmental narrowing.  Mild lateral compartmental changes.  Moderate patellofemoral changes.  Xr Knee 1-2 Views Right  Result Date: 11/29/2017 Right knee 2 views: No acute fracture.  Knee is well located.  Moderate medial compartmental narrowing.  Mild lateral compartmental changes.  Patellofemoral with moderate changes    PMFS History: Patient Active Problem List   Diagnosis Date Noted  . Tuberculosis screening 06/08/2017  . Generalized anxiety disorder 03/24/2017  . Eczema of both upper extremities 03/24/2017  . Ventral hernia without obstruction or gangrene 03/23/2017  . Healthcare maintenance 12/07/2016  . Incidental lung nodule,  greater than or equal to 29mm   . History of stroke   . Normocytic anemia 10/11/2016  . Personal history of gout 12/24/2008  . ALLERGIC RHINITIS 12/20/2008  . Osteoarthritis of both knees 12/20/2008  . TOBACCO ABUSE 07/29/2006  . Essential hypertension 07/29/2006   Past Medical History:  Diagnosis Date  . Anemia   . Anxiety   . Arthritis    knees   . Dysrhythmia    heart skips a beat   . GERD (gastroesophageal reflux disease)   . Gout   . Hyperlipidemia April 2014  . Hypertension   . Ovarian cyst    removed during mini lap 2016, benign path, Dr Denman George  .  Shortness of breath dyspnea    due to pressure of cyst per patient   . Stroke ([place])   . 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 TIA (transient ischemic attack)     Family History  Problem Relation Age of Onset  . Hypertension Mother   . Diabetes Mother   . Breast cancer Mother   . Diabetes Sister   . Hypertension Sister   . Other Father        accident    Past Surgical History:  Procedure Laterality Date  . CESAREAN SECTION     x3  . IR GENERIC HISTORICAL  11/30/2016   IR ANGIO VERTEBRAL SEL VERTEBRAL UNI L MOD SED 11/30/2016 Luanne Bras, MD [facility]  . IR GENERIC HISTORICAL  11/30/2016   IR ANGIO INTRA EXTRACRAN SEL COM CAROTID INNOMINATE BILAT MOD SED 11/30/2016 Luanne Bras, MD [facility]  . IR GENERIC HISTORICAL  11/30/2016   IR ANGIO VERTEBRAL SEL SUBCLAVIAN INNOMINATE UNI R MOD SED 11/30/2016 Luanne Bras, MD [facility]  . OVARIAN CYST SURGERY Right 1980's  . SALPINGOOPHORECTOMY Bilateral 02/06/2015   Procedure: Campbell Stall LAPAROTOMY/BILATERAL SALPINGO OOPHORECTOMY;  Surgeon: Everitt Amber, MD;  Location: [facility];  Service: Gynecology;  Laterality: Bilateral;   Social History   Occupational History  . Occupation: Pharmacist, hospital  Tobacco Use  . Smoking status: Former Smoker    Packs/day: 0.02    Types: Cigarettes    Last attempt to quit: 10/09/2016    Years since quitting: 1.1  . Smokeless tobacco: Never Used  . Tobacco comment: Quit 10/2016  Substance and Sexual Activity  . Alcohol use: No    Comment: Quit 10/2016  . Drug use: No  . Sexual activity: No

## 2017-11-29 NOTE — Progress Notes (Signed)
   Procedure Note  Patient: Mercedes Gardner             Date of Birth: 24-Jan-1946           MRN: [id number]             Visit Date: 11/29/2017  Procedures: Visit Diagnoses: Primary osteoarthritis of both knees - Plan: XR Knee 1-2 Views Left, XR Knee 1-2 Views Right  Large Joint Inj: bilateral knee on 11/29/2017 5:01 PM Indications: pain Details: 22 G 1.5 in needle, anterolateral approach  Arthrogram: No  Medications (Right): 0.5 mL lidocaine 1 %; 40 mg methylPREDNISolone acetate 40 MG/ML Medications (Left): 0.5 mL lidocaine 1 %; 40 mg methylPREDNISolone acetate 40 MG/ML Outcome: tolerated well, no immediate complications Procedure, treatment alternatives, risks and benefits explained, specific risks discussed. Consent was given by the patient. Immediately prior to procedure a time out was called to verify the correct patient, procedure, equipment, support staff and site/side marked as required. Patient was prepped and draped in the usual sterile fashion.

## 2017-12-01 IMAGING — DX DG CHEST 1V PORT
1 series · 1 of 1 positions shown · non-contrast
Comparison: 10/10/2016 and 01/31/2015 chest x-ray.

CLINICAL DATA: 70-year-old female with weakness and confusion.
Subsequent encounter.

EXAM:
PORTABLE CHEST 1 VIEW

[chest ap]
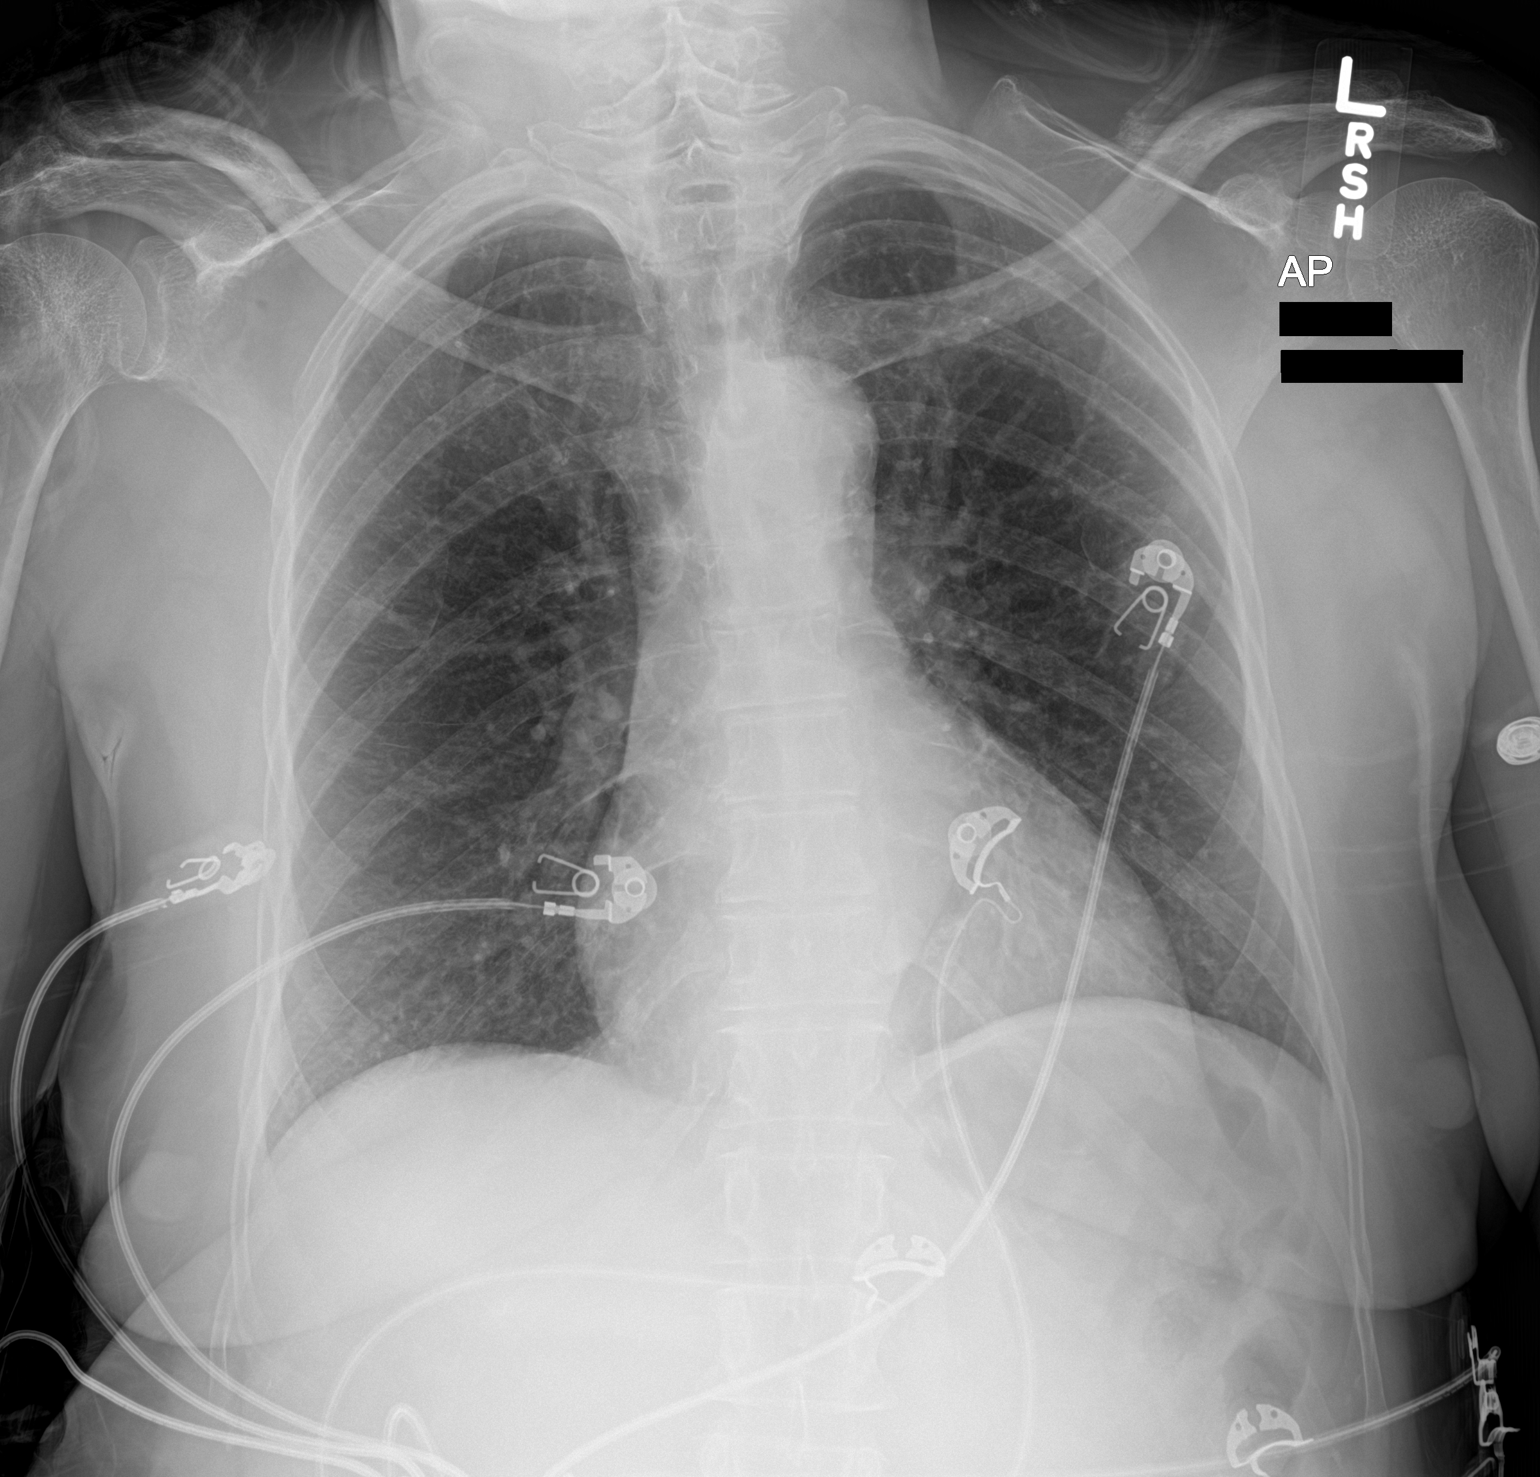

[1 of 1 positions shown; findings below may reference images not displayed]

FINDINGS: No infiltrate, congestive heart failure or pneumothorax.

No plain film evidence of pulmonary malignancy.

Heart size top-normal.

Tortuous aorta which is partially calcified.

No acute osseous abnormality. Acromioclavicular joint degenerative
changes.
IMPRESSION: No active disease.

## 2017-12-08 ENCOUNTER — Ambulatory Visit (INDEPENDENT_AMBULATORY_CARE_PROVIDER_SITE_OTHER): Payer: Medicare Other | Admitting: Orthopaedic Surgery

## 2017-12-13 ENCOUNTER — Other Ambulatory Visit: Payer: Self-pay

## 2017-12-13 ENCOUNTER — Encounter (HOSPITAL_COMMUNITY): Payer: Self-pay | Admitting: Emergency Medicine

## 2017-12-13 ENCOUNTER — Ambulatory Visit (HOSPITAL_COMMUNITY)
Admission: EM | Admit: 2017-12-13 | Discharge: 2017-12-13 | Disposition: A | Discharge status: Home / Self Care | Payer: Medicare Other | Attending: Family Medicine | Admitting: Family Medicine

## 2017-12-13 DIAGNOSIS — Z79899 Other long term (current) drug therapy: Secondary | ICD-10-CM | POA: Diagnosis not present

## 2017-12-13 DIAGNOSIS — Z7951 Long term (current) use of inhaled steroids: Secondary | ICD-10-CM | POA: Insufficient documentation

## 2017-12-13 DIAGNOSIS — E785 Hyperlipidemia, unspecified: Secondary | ICD-10-CM | POA: Insufficient documentation

## 2017-12-13 DIAGNOSIS — K219 Gastro-esophageal reflux disease without esophagitis: Secondary | ICD-10-CM | POA: Insufficient documentation

## 2017-12-13 DIAGNOSIS — Z87891 Personal history of nicotine dependence: Secondary | ICD-10-CM | POA: Insufficient documentation

## 2017-12-13 DIAGNOSIS — N898 Other specified noninflammatory disorders of vagina: Secondary | ICD-10-CM | POA: Diagnosis present

## 2017-12-13 DIAGNOSIS — Z7982 Long term (current) use of aspirin: Secondary | ICD-10-CM | POA: Diagnosis not present

## 2017-12-13 DIAGNOSIS — I10 Essential (primary) hypertension: Secondary | ICD-10-CM | POA: Diagnosis not present

## 2017-12-13 DIAGNOSIS — M170 Bilateral primary osteoarthritis of knee: Secondary | ICD-10-CM | POA: Insufficient documentation

## 2017-12-13 DIAGNOSIS — M109 Gout, unspecified: Secondary | ICD-10-CM | POA: Diagnosis not present

## 2017-12-13 DIAGNOSIS — Z8673 Personal history of transient ischemic attack (TIA), and cerebral infarction without residual deficits: Secondary | ICD-10-CM | POA: Insufficient documentation

## 2017-12-13 DIAGNOSIS — F411 Generalized anxiety disorder: Secondary | ICD-10-CM | POA: Insufficient documentation

## 2017-12-13 LAB — POCT URINALYSIS DIP (DEVICE)
Bilirubin Urine: NEGATIVE
Glucose, UA: NEGATIVE mg/dL
Ketones, ur: NEGATIVE mg/dL
NITRITE: NEGATIVE
PROTEIN: NEGATIVE mg/dL
UROBILINOGEN UA: 0.2 mg/dL (ref 0.0–1.0)
pH: 5.5 (ref 5.0–8.0)

## 2017-12-13 NOTE — ED Provider Notes (Signed)
[facility]    CSN: [id number] Arrival date & time: 12/13/17  1623     History   Chief Complaint Chief Complaint  Patient presents with  . Follow-up    HPI Mercedes Gardner is a 72 y.o. female.   72 yo female here for vaginal irritation. She states she does not wear underwear and tight pants seems to exacerbate the issue. She denies vaginal discharge. She denies sexual activity, urinary frequency, and dysuria.      Past Medical History:  Diagnosis Date  . Anemia   . Anxiety   . Arthritis    knees   . Dysrhythmia    heart skips a beat   . GERD (gastroesophageal reflux disease)   . Gout   . Hyperlipidemia April 2014  . Hypertension   . Ovarian cyst    removed during mini lap 2016, benign path, Dr Denman George  . Shortness of breath dyspnea    due to pressure of cyst per patient   . Stroke ([place])   . 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 TIA (transient ischemic attack)     Patient Active Problem List   Diagnosis Date Noted  . Tuberculosis screening 06/08/2017  . Generalized anxiety disorder 03/24/2017  . Eczema of both upper extremities 03/24/2017  . Ventral hernia without obstruction or gangrene 03/23/2017  . Healthcare maintenance 12/07/2016  . Incidental lung nodule, greater than or equal to 27mm   . History of stroke   . Normocytic anemia 10/11/2016  . Personal history of gout 12/24/2008  . ALLERGIC RHINITIS 12/20/2008  . Osteoarthritis of both knees 12/20/2008  . TOBACCO ABUSE 07/29/2006  . Essential hypertension 07/29/2006    Past Surgical History:  Procedure Laterality Date  . CESAREAN SECTION     x3  . IR GENERIC HISTORICAL  11/30/2016   IR ANGIO VERTEBRAL SEL VERTEBRAL UNI L MOD SED 11/30/2016 Luanne Bras, MD [facility]  . IR GENERIC HISTORICAL  11/30/2016   IR ANGIO INTRA EXTRACRAN SEL COM CAROTID INNOMINATE BILAT MOD SED 11/30/2016 Luanne Bras, MD [facility]  . IR GENERIC HISTORICAL  11/30/2016   IR ANGIO  VERTEBRAL SEL SUBCLAVIAN INNOMINATE UNI R MOD SED 11/30/2016 Luanne Bras, MD [facility]  . OVARIAN CYST SURGERY Right 1980's  . SALPINGOOPHORECTOMY Bilateral 02/06/2015   Procedure: Campbell Stall LAPAROTOMY/BILATERAL SALPINGO OOPHORECTOMY;  Surgeon: Everitt Marcheta Horsey, MD;  Location: [facility];  Service: Gynecology;  Laterality: Bilateral;    OB History    Gravida  4   Para  3   Term  3   Preterm      AB  1   Living        SAB      TAB      Ectopic      Multiple      Live Births               Home Medications    Prior to Admission medications   Medication Sig Start Date End Date Taking? Authorizing Provider  amLODipine (NORVASC) 10 MG tablet take 1 tablet by mouth once daily 12/27/16  Yes Lucious Groves, DO  aspirin EC 81 MG tablet Take 1 tablet (81 mg total) by mouth daily. 03/24/17 03/24/18 Yes Lucious Groves, DO  busPIRone (BUSPAR) 7.5 MG tablet Take 1 tablet (7.5 mg total) by mouth 2 (two) times daily. 10/26/17  Yes Oda Kilts, MD  clopidogrel (PLAVIX) 75 MG tablet take 1 tablet by  mouth once daily 01/14/17  Yes Lucious Groves, DO  clotrimazole-betamethasone (LOTRISONE) cream Apply to affected area 2 times daily prn 10/23/17  Yes Tereasa Coop, PA-C  COLCRYS 0.6 MG tablet Take 1 tablet (0.6 mg total) by mouth 2 (two) times daily. 10/26/17  Yes Oda Kilts, MD  conjugated estrogens (PREMARIN) vaginal cream Place 1 Applicatorful vaginally daily. 02/22/17  Yes Rice, Resa Miner, MD  diclofenac sodium (VOLTAREN) 1 % GEL Apply 2 g topically 4 (four) times daily. 10/08/17  Yes Shelda Pal, DO  ferrous sulfate 325 (65 FE) MG tablet Take 1 tablet (325 mg total) by mouth 2 (two) times daily with a meal. 10/22/16  Yes Angiulli, Lavon Paganini, PA-C  fluconazole (DIFLUCAN) 150 MG tablet Take 1 tablet (150 mg total) by mouth daily. 10/27/17  Yes Wieters, Hallie C, PA-C  fluticasone (FLONASE) 50 MCG/ACT nasal spray Place 2 sprays into both nostrils as needed.   04/17/14  Yes Presson, Annett Gula H, PA  folic acid (FOLVITE) 1 MG tablet Take 1 tablet (1 mg total) by mouth daily. 10/22/16  Yes Angiulli, Lavon Paganini, PA-C  ibuprofen (ADVIL,MOTRIN) 400 MG tablet Take 1 tablet (400 mg total) by mouth every 6 (six) hours as needed. 11/29/17  Yes Pete Pelt, PA-C  loratadine (CLARITIN) 10 MG tablet  05/29/17  Yes [provider]  meclizine (ANTIVERT) 25 MG tablet Take 1/2-1 tablet up to 3 times a day as needed for dizziness may calls some drowsiness. 04/26/17  Yes Mabe, Shanon Brow, NP  Multiple Vitamin (MULTIVITAMIN WITH MINERALS) TABS tablet Take 1 tablet by mouth daily. 10/22/16  Yes AngiulliLavon Paganini, PA-C  RA ASPIRIN EC 325 MG EC tablet  04/14/17  Yes [provider]  rosuvastatin (CRESTOR) 20 MG tablet Take 1 tablet (20 mg total) by mouth daily. 03/24/17  Yes Lucious Groves, DO    Family History Family History  Problem Relation Age of Onset  . Hypertension Mother   . Diabetes Mother   . Breast cancer Mother   . Diabetes Sister   . Hypertension Sister   . Other Father        accident    Social History Social History   Tobacco Use  . Smoking status: Former Smoker    Packs/day: 0.02    Types: Cigarettes    Last attempt to quit: 10/09/2016    Years since quitting: 1.1  . Smokeless tobacco: Never Used  . Tobacco comment: Quit 10/2016  Substance Use Topics  . Alcohol use: No    Comment: Quit 10/2016  . Drug use: No     Allergies   Tramadol and Topiramate   Review of Systems Review of Systems  Constitutional: Negative for activity change and appetite change.  HENT: Negative for congestion and ear discharge.   Eyes: Negative for discharge and itching.  Respiratory: Negative for apnea and chest tightness.   Gastrointestinal: Negative for abdominal distention and abdominal pain.  Endocrine: Negative for cold intolerance and heat intolerance.  Genitourinary: Negative for difficulty urinating, dysuria, frequency and vaginal  discharge.  Musculoskeletal: Negative for arthralgias and back pain.  Neurological: Negative for dizziness and headaches.  Hematological: Negative for adenopathy. Does not bruise/bleed easily.     Physical Exam Triage Vital Signs ED Triage Vitals  Enc Vitals Group     BP 12/13/17 1701 (!) 146/93     Pulse Rate 12/13/17 1701 89     Resp --      Temp 12/13/17 1701 98.3  F (36.8 C)     Temp Source 12/13/17 1701 Oral     SpO2 12/13/17 1701 100 %     Weight --      Height --      Head Circumference --      Peak Flow --      Pain Score 12/13/17 1700 0     Pain Loc --      Pain Edu? --      Excl. in [place]? --    No data found.  Updated Vital Signs BP (!) 146/93 (BP Location: Left Arm)   Pulse 89   Temp 98.3 F (36.8 C) (Oral)   SpO2 100%   Visual Acuity Right Eye Distance:   Left Eye Distance:   Bilateral Distance:    Right Eye Near:   Left Eye Near:    Bilateral Near:     Physical Exam  Constitutional: She is oriented to person, place, and time. She appears well-developed and well-nourished.  HENT:  Head: Normocephalic and atraumatic.  Eyes: EOM are normal.  Neck: Normal range of motion. Neck supple.  Cardiovascular: Normal rate and intact distal pulses.  Pulmonary/Chest: Effort normal. No respiratory distress.  Abdominal: Soft. There is no tenderness.  Musculoskeletal: Normal range of motion. She exhibits no edema.  Neurological: She is alert and oriented to person, place, and time.  Skin: Skin is warm.  Psychiatric: She has a normal mood and affect. Her behavior is normal.     UC Treatments / Results  Labs (all labs ordered are listed, but only abnormal results are displayed) Labs Reviewed - No data to display  EKG None Radiology No results found.  Procedures Procedures (including critical care time)  Medications Ordered in UC Medications - No data to display   Initial Impression / Assessment and Plan / UC Course  I have reviewed the triage  vital signs and the nursing notes.  Pertinent labs & imaging results that were available during my care of the patient were reviewed by me and considered in my medical decision making (see chart for details).     1. Vaginal irritation: most likely due to friction from clothing. Wet prep pending. UA unremarkable. Follow up with PCP.  Final Clinical Impressions(s) / UC Diagnoses   Final diagnoses:  None    ED Discharge Orders    None       Controlled Substance Prescriptions [place] Controlled Substance Registry consulted? Not Applicable   Dannielle Huh, DO 12/13/17 1752

## 2017-12-13 NOTE — ED Triage Notes (Signed)
Pt was treated for a UTI and yeast infection in February.  Pt here to make sure she has been "cured".  Pt is having no symptoms.

## 2017-12-14 LAB — CERVICOVAGINAL ANCILLARY ONLY
BACTERIAL VAGINITIS: POSITIVE — AB
CHLAMYDIA, DNA PROBE: NEGATIVE
Candida vaginitis: NEGATIVE
NEISSERIA GONORRHEA: NEGATIVE
Trichomonas: NEGATIVE

## 2017-12-15 ENCOUNTER — Telehealth (HOSPITAL_COMMUNITY): Payer: Self-pay

## 2017-12-15 MED ORDER — METRONIDAZOLE 500 MG PO TABS: 500.0000 mg | ORAL_TABLET | Freq: Two times a day (BID) | ORAL | 0 refills | Status: DC

## 2017-12-15 MED ORDER — FLUCONAZOLE 150 MG PO TABS: 150.0000 mg | ORAL_TABLET | Freq: Every day | ORAL | 2 refills | Status: AC

## 2017-12-15 NOTE — Telephone Encounter (Signed)
Pt contacted regarding positive test results. Pt endorses continued vaginal irritation. Metronidazole 500 mg BID x 7 days sent to pharmacy of choice per Dr. Valere Dross. Pt concerned for yeast infection once completed. Prescription for Diflucan sent to pharmacy of choice per Dr. Valere Dross. Pt is appreciative.

## 2017-12-22 ENCOUNTER — Ambulatory Visit: Payer: Medicare Other | Admitting: Internal Medicine

## 2017-12-22 ENCOUNTER — Encounter: Payer: Self-pay | Admitting: Internal Medicine

## 2017-12-22 NOTE — Telephone Encounter (Signed)
Patient canceled appointment today.

## 2017-12-22 NOTE — Telephone Encounter (Signed)
She is overdue for a PCP appt HTN F/U. Since she cancelled appt, I really can't dismiss. I have 30 day letter in Epic - would you pls print and send to pt?

## 2017-12-26 ENCOUNTER — Other Ambulatory Visit (INDEPENDENT_AMBULATORY_CARE_PROVIDER_SITE_OTHER): Payer: Self-pay | Admitting: Physician Assistant

## 2017-12-26 NOTE — Telephone Encounter (Signed)
Please advise 

## 2017-12-26 NOTE — Telephone Encounter (Signed)
She can buy over the counter.

## 2018-01-04 ENCOUNTER — Other Ambulatory Visit: Payer: Self-pay | Admitting: *Deleted

## 2018-01-04 NOTE — Telephone Encounter (Signed)
I am going to refuse this to have her call the office.  I am not sure if I am still her PCP as she has been seeing another local PCP.  If she calls please clarify this and remove me from her Epic PCP.

## 2018-01-07 ENCOUNTER — Ambulatory Visit (HOSPITAL_COMMUNITY)
Admission: EM | Admit: 2018-01-07 | Discharge: 2018-01-07 | Disposition: A | Discharge status: Home / Self Care | Payer: Medicare Other | Attending: Family Medicine | Admitting: Family Medicine

## 2018-01-07 ENCOUNTER — Encounter (HOSPITAL_COMMUNITY): Payer: Self-pay | Admitting: Emergency Medicine

## 2018-01-07 DIAGNOSIS — N898 Other specified noninflammatory disorders of vagina: Secondary | ICD-10-CM

## 2018-01-07 DIAGNOSIS — S39012A Strain of muscle, fascia and tendon of lower back, initial encounter: Secondary | ICD-10-CM | POA: Diagnosis not present

## 2018-01-07 LAB — POCT URINALYSIS DIP (DEVICE)
Bilirubin Urine: NEGATIVE
Glucose, UA: NEGATIVE mg/dL
Ketones, ur: NEGATIVE mg/dL
Leukocytes, UA: NEGATIVE
NITRITE: NEGATIVE
PH: 6 (ref 5.0–8.0)
Protein, ur: NEGATIVE mg/dL
Specific Gravity, Urine: <=1.005 (ref 1.005–1.030)
Urobilinogen, UA: 0.2 mg/dL (ref 0.0–1.0)

## 2018-01-07 MED ORDER — FLUCONAZOLE 150 MG PO TABS: 150.0000 mg | ORAL_TABLET | Freq: Every day | ORAL | 2 refills | Status: DC

## 2018-01-07 MED ORDER — NAPROXEN 500 MG PO TABS: 500.0000 mg | ORAL_TABLET | Freq: Two times a day (BID) | ORAL | 0 refills | Status: DC

## 2018-01-07 NOTE — Discharge Instructions (Signed)

## 2018-01-07 NOTE — ED Triage Notes (Signed)
Pt states she was treated for UTI and yeast and wants to be sure she doesn't have it again. Pt also states she pulled something in her shoulder.

## 2018-01-07 NOTE — ED Provider Notes (Signed)
[facility]   [id number] 01/07/18 Arrival Time: 1218  ASSESSMENT & PLAN:  1. Vaginal itching   2. Strain of lumbar region, initial encounter    Requests Diflucan to have at home since she is reportedly very prone to yeast infections. No signs of UTI.  Meds ordered this encounter  Medications  . fluconazole (DIFLUCAN) 150 MG tablet    Sig: Take 1 tablet (150 mg total) by mouth daily.    Dispense:  1 tablet    Refill:  2  . naproxen (NAPROSYN) 500 MG tablet    Sig: Take 1 tablet (500 mg total) by mouth 2 (two) times daily.    Dispense:  20 tablet    Refill:  0   Discussed typical duration of back strain. Encouraged ROM. Planning f/u with PCP, here if needed.  Reviewed expectations re: course of current medical issues. Questions answered. Outlined signs and symptoms indicating need for more acute intervention. Patient verbalized understanding. After Visit Summary given.  SUBJECTIVE: History from: patient. Mercedes Gardner is a 72 y.o. female who reports intermittent mild pain of her right lower back that is stable; described as aching without radiation. Onset: gradual, several days ago after reaching to move an object Injury/trama: no. Relieved by: rest. Worsened by: certain movements. Associated symptoms: none reported. Extremity sensation changes or weakness: none. Self treatment: has not tried OTCs for relief of pain. History of similar: no  Also requests recheck on urine. No current symptoms. Occasional vaginal itching.   ROS: As per HPI.   OBJECTIVE:  Vitals:   01/07/18 1333  BP: (!) 144/87  Pulse: 87  Resp: 18  Temp: 98.7 F (37.1 C)  SpO2: 100%    General appearance: alert; no distress Extremities: no cyanosis or edema; symmetrical with no gross deformities CV: normal extremity capillary refill Skin: warm and dry Low back: lower right paraspinal musculature tenderness; no midline tenderness; FROM at hips Neurologic: normal gait; normal  symmetric reflexes in all extremities; normal sensation in all extremities Psychological: alert and cooperative; normal mood and affect  Allergies  Allergen Reactions  . Tramadol Other (See Comments)    Depression, moody, increased back pain  . Topiramate     "mood swings"    Past Medical History:  Diagnosis Date  . Anemia   . Anxiety   . Arthritis    knees   . Dysrhythmia    heart skips a beat   . GERD (gastroesophageal reflux disease)   . Gout   . Hyperlipidemia April 2014  . Hypertension   . Ovarian cyst    removed during mini lap 2016, benign path, Dr Denman George  . Shortness of breath dyspnea    due to pressure of cyst per patient   . Stroke ([place])   . 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 TIA (transient ischemic attack)    Social History   Socioeconomic History  . Marital status: Divorced    Spouse name: Not on file  . Number of children: 3  . Years of education: some college  . Highest education level: Not on file  Occupational History  . Occupation: Pharmacist, hospital  Social Needs  . Financial resource strain: Not on file  . Food insecurity:    Worry: Not on file    Inability: Not on file  . Transportation needs:    Medical: Not on file    Non-medical: Not on file  Tobacco Use  . Smoking status: Former Smoker  Packs/day: 0.02    Types: Cigarettes    Last attempt to quit: 10/09/2016    Years since quitting: 1.2  . Smokeless tobacco: Never Used  . Tobacco comment: Quit 10/2016  Substance and Sexual Activity  . Alcohol use: No    Comment: Quit 10/2016  . Drug use: No  . Sexual activity: Never  Lifestyle  . Physical activity:    Days per week: Not on file    Minutes per session: Not on file  . Stress: Not on file  Relationships  . Social connections:    Talks on phone: Not on file    Gets together: Not on file    Attends religious service: Not on file    Active member of club or organization: Not on file    Attends meetings of clubs or  organizations: Not on file    Relationship status: Not on file  . Intimate partner violence:    Fear of current or ex partner: Not on file    Emotionally abused: Not on file    Physically abused: Not on file    Forced sexual activity: Not on file  Other Topics Concern  . Not on file  Social History Narrative   Lives at home with son and grandson.   Right-handed.   Occasional caffeine use.   Family History  Problem Relation Age of Onset  . Hypertension Mother   . Diabetes Mother   . Breast cancer Mother   . Diabetes Sister   . Hypertension Sister   . Other Father        accident   Past Surgical History:  Procedure Laterality Date  . CESAREAN SECTION     x3  . IR GENERIC HISTORICAL  11/30/2016   IR ANGIO VERTEBRAL SEL VERTEBRAL UNI L MOD SED 11/30/2016 Luanne Bras, MD [facility]  . IR GENERIC HISTORICAL  11/30/2016   IR ANGIO INTRA EXTRACRAN SEL COM CAROTID INNOMINATE BILAT MOD SED 11/30/2016 Luanne Bras, MD [facility]  . IR GENERIC HISTORICAL  11/30/2016   IR ANGIO VERTEBRAL SEL SUBCLAVIAN INNOMINATE UNI R MOD SED 11/30/2016 Luanne Bras, MD [facility]  . OVARIAN CYST SURGERY Right 1980's  . SALPINGOOPHORECTOMY Bilateral 02/06/2015   Procedure: Campbell Stall LAPAROTOMY/BILATERAL SALPINGO OOPHORECTOMY;  Surgeon: Everitt Amber, MD;  Location: [facility];  Service: Gynecology;  Laterality: Bilateral;      Vanessa Kick, MD 01/07/18 1419

## 2018-01-09 ENCOUNTER — Telehealth (INDEPENDENT_AMBULATORY_CARE_PROVIDER_SITE_OTHER): Payer: Self-pay | Admitting: Orthopaedic Surgery

## 2018-01-09 NOTE — Telephone Encounter (Signed)
Please advise 

## 2018-01-09 NOTE — Telephone Encounter (Signed)
Please send in a prescription for 800 mg ibuprofen to her pharmacy.  This will be for 1 pill 3 times a day with meals as needed for pain and inflammation.  #90 with 3 refills.

## 2018-01-09 NOTE — Telephone Encounter (Signed)
Patient requesting refill on the ibuprofen Dr. Ninfa Linden had given her at the last visit. # [phone]

## 2018-01-10 ENCOUNTER — Other Ambulatory Visit (INDEPENDENT_AMBULATORY_CARE_PROVIDER_SITE_OTHER): Payer: Self-pay

## 2018-01-10 MED ORDER — IBUPROFEN 800 MG PO TABS: 800.0000 mg | ORAL_TABLET | Freq: Three times a day (TID) | ORAL | 3 refills | Status: DC | PRN

## 2018-01-10 NOTE — Telephone Encounter (Signed)
Sent in to pharmacy.  

## 2018-01-20 IMAGING — XA IR VERTEBRAL  NON-SELECT UNILAT  RIGHT(MS)
1 series · 12 of 24 positions shown · IV contrast (IODINE)
Comparison: none

INDICATION: Vertebrobasilar ischemic symptoms.
TECHNIQUE: Informed written consent was obtained from the patient after a
thorough discussion of the procedural risks, benefits and
alternatives. All questions were addressed. Maximal Sterile Barrier
Technique was utilized including caps, mask, sterile gowns, sterile
gloves, sterile drape, hand hygiene and skin antiseptic. A timeout
was performed prior to the initiation of the procedure.

[Series 300: dr. (person_name) · 12 of 149 slices shown]
[im 7/149]
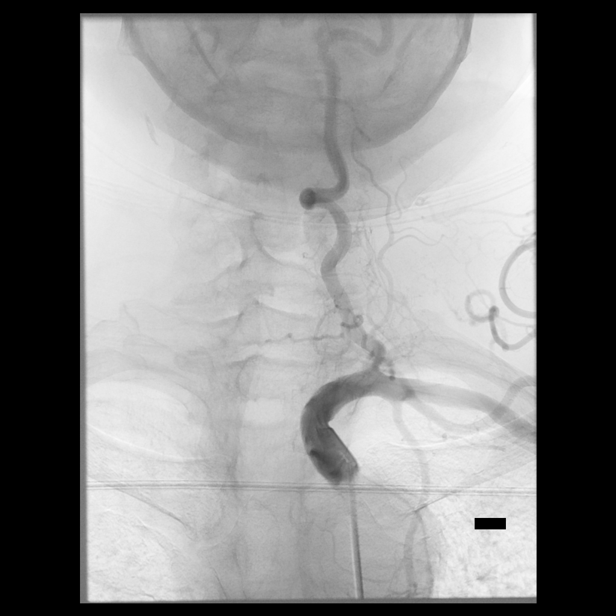
[im 20/149]
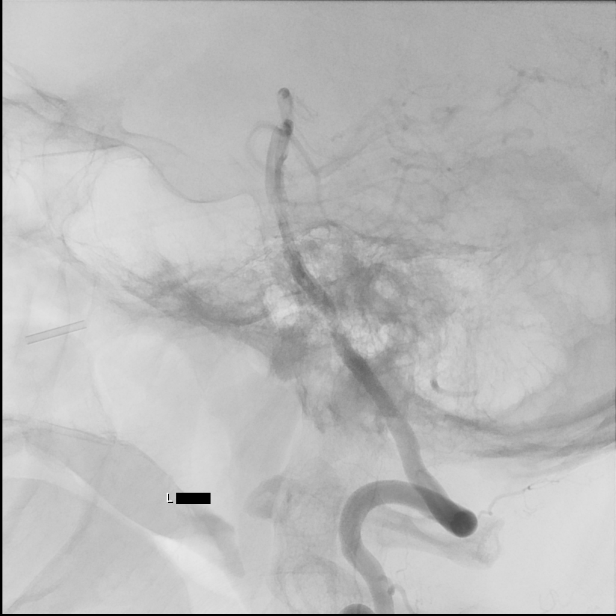
[im 33/149]
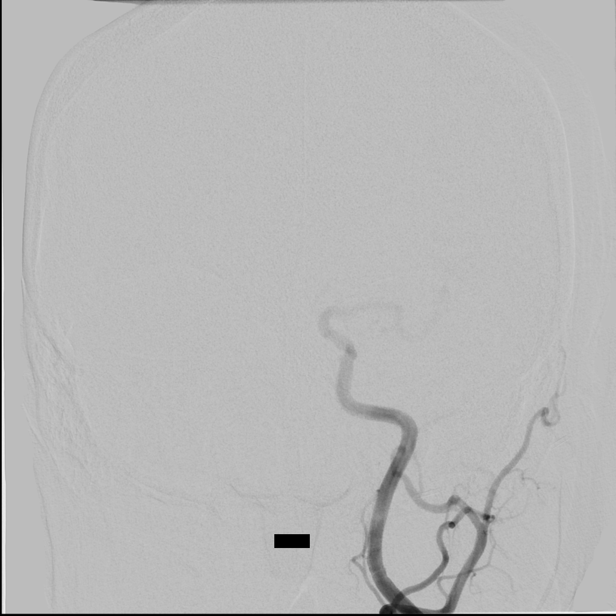
[im 46/149]
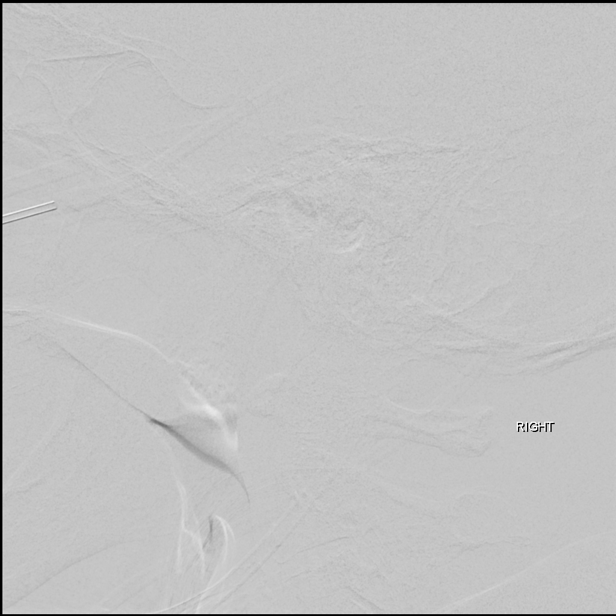
[im 58/149]
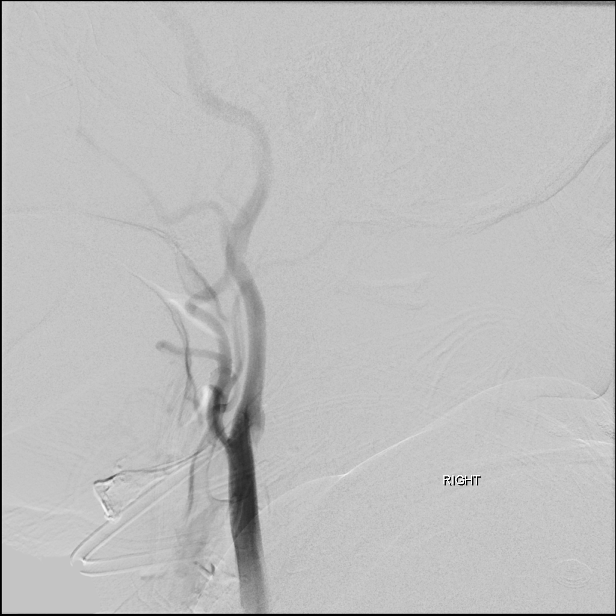
[im 71/149]
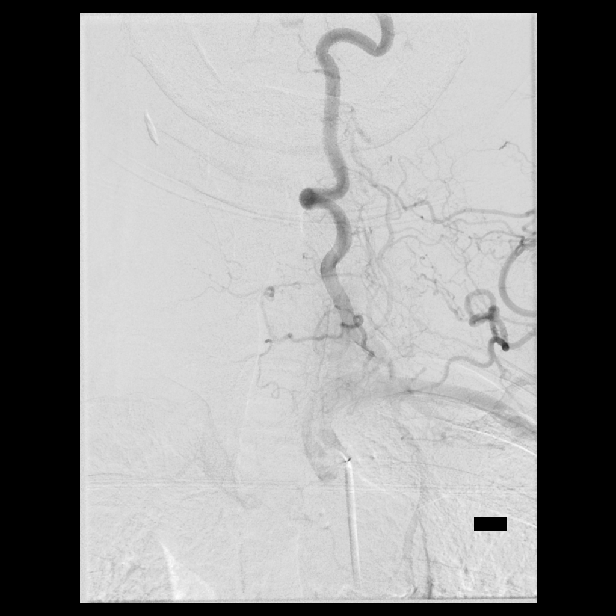
[im 84/149]
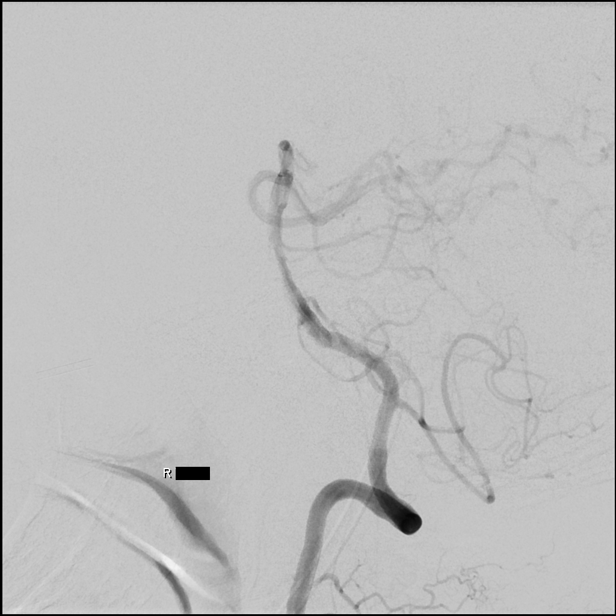
[im 97/149]
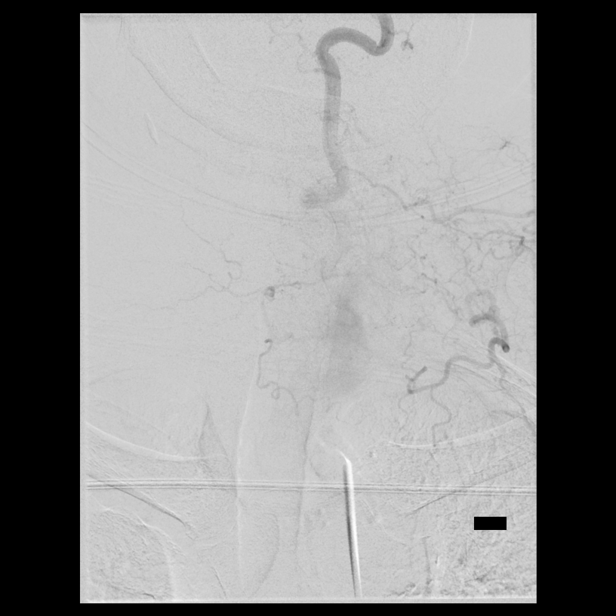
[im 110/149]
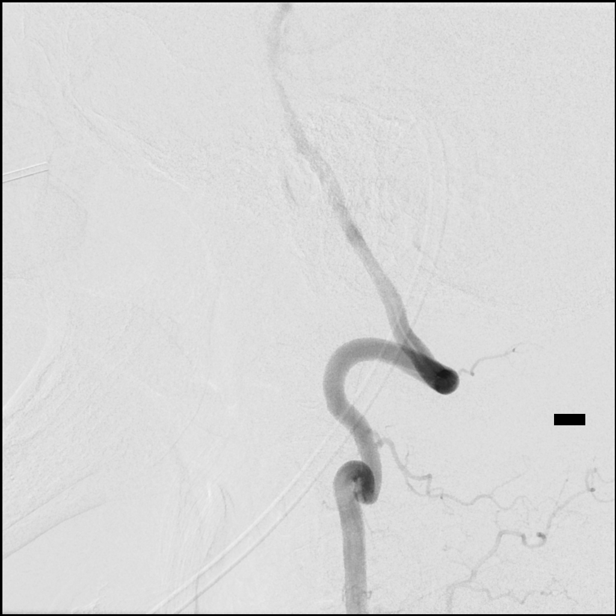
[im 123/149]
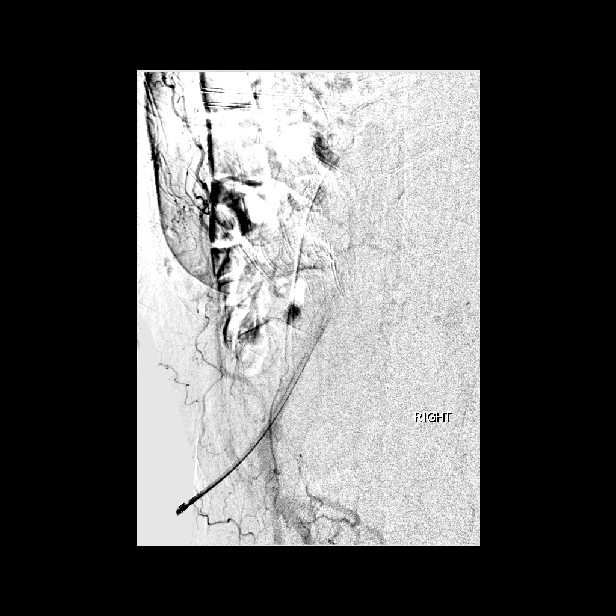
[im 136/149]
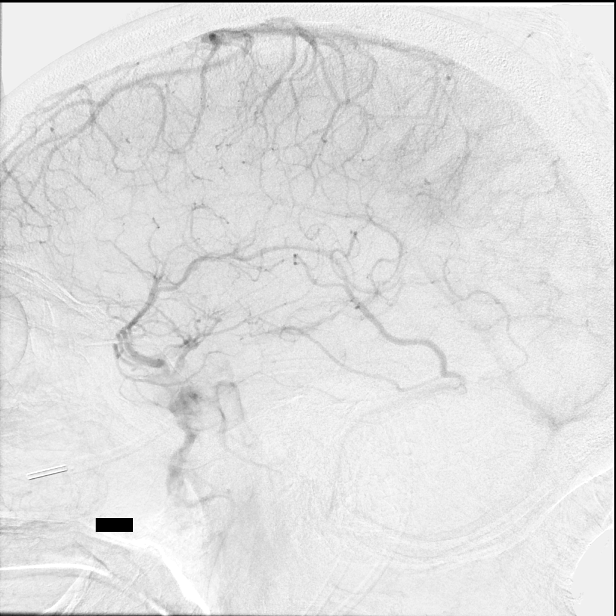
[im 149/149]
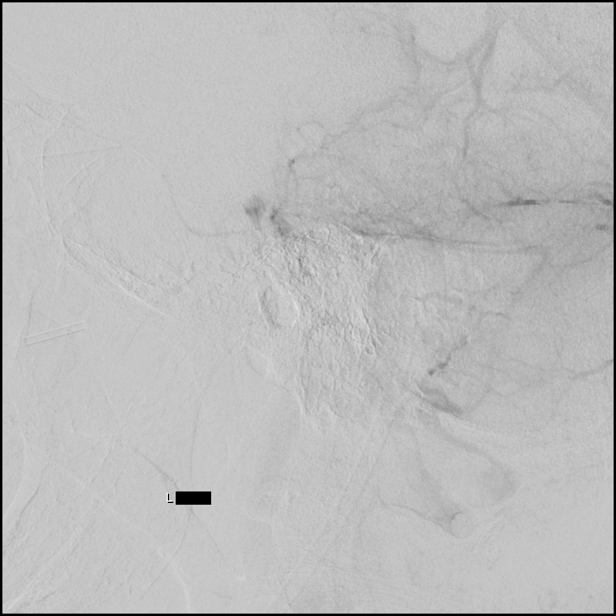

[12 of 24 positions shown; findings below may reference images not displayed]

EXAM:
BILATERAL COMMON CAROTID AND INNOMINATE ANGIOGRAPHY AND BILATERAL
VERTEBRAL ARTERY ANGIOGRAMS

MEDICATIONS:
Heparin 2666 units IV. No antibiotic was administered within 1 hour
of the procedure.

ANESTHESIA/SEDATION:
Versed 0 mg IV; Fentanyl 25 mcg IV

Moderate Sedation Time:  30 minutes.

The patient was continuously monitored during the procedure by the
interventional radiology nurse under my direct supervision.

FLUOROSCOPY TIME:  Fluoroscopy Time: 10 minutes 54 seconds (802
mGy).

COMPLICATIONS:
None immediate.
PROCEDURE:
The right groin was prepped and draped in the usual sterile fashion.
Thereafter using modified Seldinger technique, transfemoral access
into the right common femoral artery was obtained without
difficulty. Over a 0.035 inch guidewire, a 5 French Pinnacle sheath
was inserted. Through this, and also over 0.035 inch guidewire, a 5
French JB1 catheter was advanced to the aortic arch region and
selectively positioned in the right common carotid artery, the right
subclavian artery, the left common carotid artery and the left
vertebral artery.
FINDINGS: The right common carotid artery origin is the first branch from the
aortic arch.

The vessel is seen to opacify to its bifurcation. The right common
carotid bifurcation demonstrates approximately 60% stenosis of the
right external carotid artery at its origin. Its branches are
normally opacified.

The right internal carotid artery at the bulb to the cranial skull
base is seen to opacify widely. The petrous portion is widely
patent.

There is mild stenosis at the petrous cavernous junction.

The distal cavernous and the supraclinoid segments are widely
patent.

The right middle cerebral artery and the right anterior cerebral
artery opacify into the capillary and venous phases.

Prompt opacification via the anterior communicating artery of the
left anterior cerebral artery A2 segment is noted.

The left vertebral artery origin demonstrates mild stenosis at its
origin.

The vessel, otherwise, is seen to opacify widely to the cranial
skull base.

Wide patency is seen of the left vertebrobasilar junction. The left
posterior-inferior cerebellar artery/anterior-inferior cerebellar
complex is seen.

The vertebrobasilar junction demonstrates a tortuous course into the
basilar artery.

At the level of the mid basilar artery, there is approximately
65-70% stenoses best appreciated on the AP projection.

Distal to this there is wide patency of the posterior cerebral
arteries, the superior cerebellar arteries and also of the right
anterior-inferior cerebellar artery into the capillary and venous
phases. Non-opacified blood is seen in the basilar artery proximally
from the contralateral vertebral artery.

The right subclavian artery has an aberrant origin a developmental
variation.

The origin of the right vertebral artery appears widely patent.

The vessel is seen to opacify widely to the cranial skull base.

There is a moderate tortuosity in the mid cervical portion of the
vertebral artery.

Patency is seen of the right posterior-inferior cerebellar artery
and the right vertebrobasilar junction.

Again demonstrated is an approximate 65-70% stenosis of the mid
basilar artery. Distal to this wide patency is noted of the
posterior cerebral arteries, superior cerebellar arteries and the
anterior-inferior cerebellar arteries into the capillary and venous
phases. Non-opacified blood is seen in the basilar artery from the
contralateral vertebral artery.

The left common carotid arteriogram demonstrates the left external
carotid artery and its major branches to be widely patent.

The left internal carotid artery at the bulb to the cranial skull
base opacifies normally.

The petrous segment is widely patent.

There is mild diffuse narrowing of the proximal cavernous segment.
The distal cavernous segment and the supraclinoid segments are
widely patent.

The left middle cerebral artery opacifies normally into the
capillary and venous phases.

There is no angiographic opacification of the left anterior cerebral
artery.
IMPRESSION: Approximately 65% to 70% stenosis of the mid basilar artery.

Abbarent origin of the right subclavian artery, a developmental
anomaly.

Nonvisualization of the left anterior cerebral A1 segment may be
related to either developmental aplasia versus pathologic occlusion
secondary to intracranial arteriosclerosis.

## 2018-01-23 ENCOUNTER — Other Ambulatory Visit (INDEPENDENT_AMBULATORY_CARE_PROVIDER_SITE_OTHER): Payer: Self-pay | Admitting: Physician Assistant

## 2018-02-21 ENCOUNTER — Telehealth (INDEPENDENT_AMBULATORY_CARE_PROVIDER_SITE_OTHER): Payer: Self-pay | Admitting: Orthopaedic Surgery

## 2018-02-21 NOTE — Telephone Encounter (Signed)
No needs follow up

## 2018-02-21 NOTE — Telephone Encounter (Signed)
Please advise 

## 2018-02-21 NOTE — Telephone Encounter (Signed)
She needs an appt first

## 2018-02-21 NOTE — Telephone Encounter (Signed)
Patient requesting rx refill on tylenol 3, she uses the [facility] on [address]. Please advise # [phone]

## 2018-02-22 NOTE — Telephone Encounter (Signed)
She already has appt 6/19. That's the first available, thanks

## 2018-03-01 ENCOUNTER — Encounter (INDEPENDENT_AMBULATORY_CARE_PROVIDER_SITE_OTHER): Payer: Self-pay | Admitting: Physician Assistant

## 2018-03-01 ENCOUNTER — Ambulatory Visit (INDEPENDENT_AMBULATORY_CARE_PROVIDER_SITE_OTHER): Payer: Medicare Other | Admitting: Physician Assistant

## 2018-03-01 DIAGNOSIS — M170 Bilateral primary osteoarthritis of knee: Secondary | ICD-10-CM | POA: Diagnosis not present

## 2018-03-01 MED ORDER — ACETAMINOPHEN-CODEINE #3 300-30 MG PO TABS: 1.0000 | ORAL_TABLET | Freq: Four times a day (QID) | ORAL | 0 refills | Status: DC | PRN

## 2018-03-01 NOTE — Progress Notes (Signed)
Office Visit Note   Patient: Mercedes Gardner           Date of Birth: 1945/10/10           MRN: [id number] Visit Date: 03/01/2018              Requested by: Mercedes Groves, DO [address]  [place], [place] [address] PCP: Patient, No Pcp Per   Assessment & Plan: Visit Diagnoses:  1. Primary osteoarthritis of both knees     Plan: Have patient follow-up on an as-needed basis.  She understands she cannot have cortisone injections anymore than every 3 months.  In regards to pain medication she is given a #30 of Tylenol #3 1 every 6 hours she is taking sparingly.  Did discuss her with her that this be a one-time fill and Tylenol #3 she will need to get this from her primary care physician  Follow-Up Instructions: Return if symptoms worsen or fail to improve.   Orders:  No orders of the defined types were placed in this encounter.  Meds ordered this encounter  Medications  . acetaminophen-codeine (TYLENOL #3) 300-30 MG tablet    Sig: Take 1 tablet by mouth every 6 (six) hours as needed for moderate pain.    Dispense:  30 tablet    Refill:  0      Procedures: No procedures performed   Clinical Data: No additional findings.   Subjective: Chief Complaint  Patient presents with  . Right Knee - Pain    S/p cortisone injection 11/29/17  . Left Knee - Pain    S/p cortisone injection 11/29/17    HPI Mercedes Gardner returns today requesting injections in both knees.  She last injection on 11/29/2017.  She reports that her primary care physician has been prescribing Tylenol 3 but wanted her to get this in the future from our office.  She said no new injury to either knee.  Patient has known osteoarthritis of both knees Review of Systems  No fevers chills shortness of breath chest pain   Objective: Vital Signs: There were no vitals taken for this visit.  Physical Exam  Constitutional: She is oriented to person, place, and time. She appears well-developed and well-nourished. No  distress.  Pulmonary/Chest: Effort normal.  Neurological: She is alert and oriented to person, place, and time.  Skin: She is not diaphoretic.  Psychiatric: She has a normal mood and affect.    Ortho Exam Bilateral knees good range of motion.  No effusion abnormal warmth or erythema. Specialty Comments:  No specialty comments available.  Imaging: No results found.   PMFS History: Patient Active Problem List   Diagnosis Date Noted  . Tuberculosis screening 06/08/2017  . Generalized anxiety disorder 03/24/2017  . Eczema of both upper extremities 03/24/2017  . Ventral hernia without obstruction or gangrene 03/23/2017  . Healthcare maintenance 12/07/2016  . Incidental lung nodule, greater than or equal to 82mm   . History of stroke   . Normocytic anemia 10/11/2016  . Personal history of gout 12/24/2008  . ALLERGIC RHINITIS 12/20/2008  . Osteoarthritis of both knees 12/20/2008  . TOBACCO ABUSE 07/29/2006  . Essential hypertension 07/29/2006   Past Medical History:  Diagnosis Date  . Anemia   . Anxiety   . Arthritis    knees   . Dysrhythmia    heart skips a beat   . GERD (gastroesophageal reflux disease)   . Gout   . Hyperlipidemia April 2014  .  Hypertension   . Ovarian cyst    removed during mini lap 2016, benign path, Dr Denman George  . Shortness of breath dyspnea    due to pressure of cyst per patient   . Stroke ([place])   . 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 TIA (transient ischemic attack)     Family History  Problem Relation Age of Onset  . Hypertension Mother   . Diabetes Mother   . Breast cancer Mother   . Diabetes Sister   . Hypertension Sister   . Other Father        accident    Past Surgical History:  Procedure Laterality Date  . CESAREAN SECTION     x3  . IR GENERIC HISTORICAL  11/30/2016   IR ANGIO VERTEBRAL SEL VERTEBRAL UNI L MOD SED 11/30/2016 Luanne Bras, MD [facility]  . IR GENERIC HISTORICAL  11/30/2016   IR ANGIO INTRA  EXTRACRAN SEL COM CAROTID INNOMINATE BILAT MOD SED 11/30/2016 Luanne Bras, MD [facility]  . IR GENERIC HISTORICAL  11/30/2016   IR ANGIO VERTEBRAL SEL SUBCLAVIAN INNOMINATE UNI R MOD SED 11/30/2016 Luanne Bras, MD [facility]  . OVARIAN CYST SURGERY Right 1980's  . SALPINGOOPHORECTOMY Bilateral 02/06/2015   Procedure: Campbell Stall LAPAROTOMY/BILATERAL SALPINGO OOPHORECTOMY;  Surgeon: Everitt Amber, MD;  Location: [facility];  Service: Gynecology;  Laterality: Bilateral;   Social History   Occupational History  . Occupation: Pharmacist, hospital  Tobacco Use  . Smoking status: Former Smoker    Packs/day: 0.02    Types: Cigarettes    Last attempt to quit: 10/09/2016    Years since quitting: 1.3  . Smokeless tobacco: Never Used  . Tobacco comment: Quit 10/2016  Substance and Sexual Activity  . Alcohol use: No    Comment: Quit 10/2016  . Drug use: No  . Sexual activity: Never

## 2018-05-08 ENCOUNTER — Telehealth (INDEPENDENT_AMBULATORY_CARE_PROVIDER_SITE_OTHER): Payer: Self-pay | Admitting: Family Medicine

## 2018-05-08 ENCOUNTER — Ambulatory Visit (INDEPENDENT_AMBULATORY_CARE_PROVIDER_SITE_OTHER): Payer: Medicare Other | Admitting: Family Medicine

## 2018-05-08 NOTE — Telephone Encounter (Signed)
Patient advised.  She will come tomorrow anyway, because she said she has hurt her knee and would like an xray.

## 2018-05-08 NOTE — Telephone Encounter (Signed)
Patient called asked if she can get an injection in her knee when she come to her appointment tomorrow? The number to contact patient is [phone]

## 2018-05-08 NOTE — Telephone Encounter (Signed)
Yes I believe that I injected both but looks like I did not chart it so call her and let her know we will not be able to inject her knees until September

## 2018-05-08 NOTE — Telephone Encounter (Signed)
Could you please look at your last ov note on this patient and let me know if you did inject her knees or just give her an Rx?

## 2018-05-09 ENCOUNTER — Ambulatory Visit (INDEPENDENT_AMBULATORY_CARE_PROVIDER_SITE_OTHER): Payer: Medicare Other | Admitting: Family Medicine

## 2018-05-09 ENCOUNTER — Encounter (INDEPENDENT_AMBULATORY_CARE_PROVIDER_SITE_OTHER): Payer: Self-pay | Admitting: Family Medicine

## 2018-05-09 DIAGNOSIS — G8929 Other chronic pain: Secondary | ICD-10-CM | POA: Diagnosis not present

## 2018-05-09 DIAGNOSIS — M25561 Pain in right knee: Secondary | ICD-10-CM | POA: Diagnosis not present

## 2018-05-09 DIAGNOSIS — M25562 Pain in left knee: Secondary | ICD-10-CM

## 2018-05-09 NOTE — Progress Notes (Signed)
Office Visit Note   Patient: Mercedes Gardner           Date of Birth: November 02, 1945           MRN: [id number] Visit Date: 05/09/2018 Requested by: No referring provider defined for this encounter. PCP: Patient, No Pcp Per  Subjective: Chief Complaint  Patient presents with  . Right Knee - Pain    3 weeks ago, a child (at her work) was throwing a tantrum, hitting his head against her right knee - pain and "heaviness" in the knee since then    HPI: She is here with right greater than left knee pain.  Long-standing history of osteo-arthritis.  Injection 2 months ago in the right knee helped but seems to have worn off.  It is too soon for another cortisone injection and she wonders what to do for pain.  She does not want to have knee replacement for a couple more years.  She is still very active despite her pain.               ROS: Otherwise noncontributory.  Objective: Vital Signs: There were no vitals taken for this visit.  Physical Exam:  Knees: No effusion, no warmth or erythema in either knee.  Both knees are tender on the medial joint line.  She has 1+ laxity with valgus stress on the right but still a solid endpoint.  Imaging: No new x-rays obtained  Assessment & Plan: 1.  Chronic bilateral knee pain due to primarily medial compartment osteoarthritis -We will try an intra-articular Toradol injection.  We will get her fitted for a medial compartment unloading brace for the right knee.  She will try glucosamine and turmeric over-the-counter.   Follow-Up Instructions: No follow-ups on file.     Procedures: Bilateral knee injections: After sterile prep with Betadine, injected 3 cc 1% lidocaine without epinephrine and 30 mg Toradol from medial bent knee approach into each knee without complication.   PMFS History: Patient Active Problem List   Diagnosis Date Noted  . Tuberculosis screening 06/08/2017  . Generalized anxiety disorder 03/24/2017  . Eczema of both upper  extremities 03/24/2017  . Ventral hernia without obstruction or gangrene 03/23/2017  . Healthcare maintenance 12/07/2016  . Incidental lung nodule, greater than or equal to 4mm   . History of stroke   . Normocytic anemia 10/11/2016  . Personal history of gout 12/24/2008  . ALLERGIC RHINITIS 12/20/2008  . Osteoarthritis of both knees 12/20/2008  . TOBACCO ABUSE 07/29/2006  . Essential hypertension 07/29/2006   Past Medical History:  Diagnosis Date  . Anemia   . Anxiety   . Arthritis    knees   . Dysrhythmia    heart skips a beat   . GERD (gastroesophageal reflux disease)   . Gout   . Hyperlipidemia April 2014  . Hypertension   . Ovarian cyst    removed during mini lap 2016, benign path, Dr Denman George  . Shortness of breath dyspnea    due to pressure of cyst per patient   . Stroke ([place])   . 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 TIA (transient ischemic attack)     Family History  Problem Relation Age of Onset  . Hypertension Mother   . Diabetes Mother   . Breast cancer Mother   . Diabetes Sister   . Hypertension Sister   . Other Father        accident    Past  Surgical History:  Procedure Laterality Date  . CESAREAN SECTION     x3  . IR GENERIC HISTORICAL  11/30/2016   IR ANGIO VERTEBRAL SEL VERTEBRAL UNI L MOD SED 11/30/2016 Luanne Bras, MD [facility]  . IR GENERIC HISTORICAL  11/30/2016   IR ANGIO INTRA EXTRACRAN SEL COM CAROTID INNOMINATE BILAT MOD SED 11/30/2016 Luanne Bras, MD [facility]  . IR GENERIC HISTORICAL  11/30/2016   IR ANGIO VERTEBRAL SEL SUBCLAVIAN INNOMINATE UNI R MOD SED 11/30/2016 Luanne Bras, MD [facility]  . OVARIAN CYST SURGERY Right 1980's  . SALPINGOOPHORECTOMY Bilateral 02/06/2015   Procedure: Campbell Stall LAPAROTOMY/BILATERAL SALPINGO OOPHORECTOMY;  Surgeon: Everitt Amber, MD;  Location: [facility];  Service: Gynecology;  Laterality: Bilateral;   Social History   Occupational History  . Occupation: Pharmacist, hospital    Tobacco Use  . Smoking status: Former Smoker    Packs/day: 0.02    Types: Cigarettes    Last attempt to quit: 10/09/2016    Years since quitting: 1.5  . Smokeless tobacco: Never Used  . Tobacco comment: Quit 10/2016  Substance and Sexual Activity  . Alcohol use: No    Comment: Quit 10/2016  . Drug use: No  . Sexual activity: Never
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
## 2018-05-12 ENCOUNTER — Other Ambulatory Visit (INDEPENDENT_AMBULATORY_CARE_PROVIDER_SITE_OTHER): Payer: Self-pay | Admitting: Physician Assistant

## 2018-05-12 NOTE — Telephone Encounter (Signed)
Please advise 

## 2018-05-29 ENCOUNTER — Telehealth (INDEPENDENT_AMBULATORY_CARE_PROVIDER_SITE_OTHER): Payer: Self-pay | Admitting: Orthopaedic Surgery

## 2018-05-29 NOTE — Telephone Encounter (Signed)
Returned to patient left message to call back to reschedule apointment

## 2018-06-17 ENCOUNTER — Other Ambulatory Visit (INDEPENDENT_AMBULATORY_CARE_PROVIDER_SITE_OTHER): Payer: Self-pay | Admitting: Orthopaedic Surgery

## 2018-09-27 ENCOUNTER — Other Ambulatory Visit (INDEPENDENT_AMBULATORY_CARE_PROVIDER_SITE_OTHER): Payer: Self-pay

## 2018-09-27 ENCOUNTER — Telehealth (INDEPENDENT_AMBULATORY_CARE_PROVIDER_SITE_OTHER): Payer: Self-pay

## 2018-09-27 ENCOUNTER — Ambulatory Visit (INDEPENDENT_AMBULATORY_CARE_PROVIDER_SITE_OTHER): Payer: Medicare Other | Admitting: Orthopaedic Surgery

## 2018-09-27 ENCOUNTER — Encounter (INDEPENDENT_AMBULATORY_CARE_PROVIDER_SITE_OTHER): Payer: Self-pay | Admitting: Orthopaedic Surgery

## 2018-09-27 DIAGNOSIS — M1712 Unilateral primary osteoarthritis, left knee: Secondary | ICD-10-CM

## 2018-09-27 DIAGNOSIS — M1711 Unilateral primary osteoarthritis, right knee: Secondary | ICD-10-CM | POA: Diagnosis not present

## 2018-09-27 MED ORDER — LIDOCAINE HCL 1 % IJ SOLN
3.0000 mL | INTRAMUSCULAR | Status: AC | PRN
  Administered 2018-09-27: 3 mL

## 2018-09-27 MED ORDER — METHYLPREDNISOLONE ACETATE 40 MG/ML IJ SUSP
40.0000 mg | INTRAMUSCULAR | Status: AC | PRN
  Administered 2018-09-27: 40 mg via INTRA_ARTICULAR

## 2018-09-27 NOTE — Progress Notes (Signed)
Office Visit Note   Patient: Mercedes Gardner           Date of Birth: 1946-08-25           MRN: [id number] Visit Date: 09/27/2018              Requested by: No referring provider defined for this encounter. PCP: Patient, No Pcp Per   Assessment & Plan: Visit Diagnoses:  1. Unilateral primary osteoarthritis, left knee   2. Unilateral primary osteoarthritis, right knee     Plan: Per her request I did place a steroid injection in both knees.  We did give her knee sleeves for both sides.  We will see her back in 4 weeks for hyaluronic acid replacement both knees.  Follow-Up Instructions: Return in about 4 weeks (around 10/25/2018).   Orders:  No orders of the defined types were placed in this encounter.  No orders of the defined types were placed in this encounter.     Procedures: Large Joint Inj: R knee on 09/27/2018 2:26 PM Indications: diagnostic evaluation and pain Details: 22 G 1.5 in needle, superolateral approach  Arthrogram: No  Medications: 3 mL lidocaine 1 %; 40 mg methylPREDNISolone acetate 40 MG/ML Outcome: tolerated well, no immediate complications Procedure, treatment alternatives, risks and benefits explained, specific risks discussed. Consent was given by the patient. Immediately prior to procedure a time out was called to verify the correct patient, procedure, equipment, support staff and site/side marked as required. Patient was prepped and draped in the usual sterile fashion.   Large Joint Inj: L knee on 09/27/2018 2:26 PM Indications: diagnostic evaluation and pain Details: 22 G 1.5 in needle, superolateral approach  Arthrogram: No  Medications: 3 mL lidocaine 1 %; 40 mg methylPREDNISolone acetate 40 MG/ML Outcome: tolerated well, no immediate complications Procedure, treatment alternatives, risks and benefits explained, specific risks discussed. Consent was given by the patient. Immediately prior to procedure a time out was called to verify the correct  patient, procedure, equipment, support staff and site/side marked as required. Patient was prepped and draped in the usual sterile fashion.       Clinical Data: No additional findings.   Subjective: Chief Complaint  Patient presents with  . Left Knee - Pain  . Right Knee - Pain  The patient is well-known to me.  She has bilateral knee tricompartmental arthritis with significant bowed legs and varus malalignment.  She wants to consider knee replacement surgery but not for another 6 months or more.  She is very active 73 years old.  She is very thin as well.  She would like to have knee sleeves today as well as steroid injections in both her knees today.  She has not had hyaluronic acid as far as I can tell something that is another option for her for her knees.  She has had no other extensive change in her medical status at all.  She does smoke daily though.  HPI  Review of Systems She currently denies any headache, chest pain, shortness of breath, fever, chills, nausea, vomiting.  Objective: Vital Signs: There were no vitals taken for this visit.  Physical Exam She is alert and orient x3 and in no acute distress Ortho Exam Examination of both knees show significant varus malalignment with excellent range of motion.  Both knees are ligamentously stable but have medial joint line tenderness and patellofemoral crepitation. Specialty Comments:  No specialty comments available.  Imaging: No results found.   PMFS History: Patient  Active Problem List   Diagnosis Date Noted  . Tuberculosis screening 06/08/2017  . Generalized anxiety disorder 03/24/2017  . Eczema of both upper extremities 03/24/2017  . Ventral hernia without obstruction or gangrene 03/23/2017  . Healthcare maintenance 12/07/2016  . Incidental lung nodule, greater than or equal to 76mm   . History of stroke   . Normocytic anemia 10/11/2016  . Personal history of gout 12/24/2008  . ALLERGIC RHINITIS 12/20/2008  .  Osteoarthritis of both knees 12/20/2008  . TOBACCO ABUSE 07/29/2006  . Essential hypertension 07/29/2006   Past Medical History:  Diagnosis Date  . Anemia   . Anxiety   . Arthritis    knees   . Dysrhythmia    heart skips a beat   . GERD (gastroesophageal reflux disease)   . Gout   . Hyperlipidemia April 2014  . Hypertension   . Ovarian cyst    removed during mini lap 2016, benign path, Dr Denman George  . Shortness of breath dyspnea    due to pressure of cyst per patient   . Stroke ([place])   . 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 TIA (transient ischemic attack)     Family History  Problem Relation Age of Onset  . Hypertension Mother   . Diabetes Mother   . Breast cancer Mother   . Diabetes Sister   . Hypertension Sister   . Other Father        accident    Past Surgical History:  Procedure Laterality Date  . CESAREAN SECTION     x3  . IR GENERIC HISTORICAL  11/30/2016   IR ANGIO VERTEBRAL SEL VERTEBRAL UNI L MOD SED 11/30/2016 Luanne Bras, MD [facility]  . IR GENERIC HISTORICAL  11/30/2016   IR ANGIO INTRA EXTRACRAN SEL COM CAROTID INNOMINATE BILAT MOD SED 11/30/2016 Luanne Bras, MD [facility]  . IR GENERIC HISTORICAL  11/30/2016   IR ANGIO VERTEBRAL SEL SUBCLAVIAN INNOMINATE UNI R MOD SED 11/30/2016 Luanne Bras, MD [facility]  . OVARIAN CYST SURGERY Right 1980's  . SALPINGOOPHORECTOMY Bilateral 02/06/2015   Procedure: Campbell Stall LAPAROTOMY/BILATERAL SALPINGO OOPHORECTOMY;  Surgeon: Everitt Amber, MD;  Location: [facility];  Service: Gynecology;  Laterality: Bilateral;   Social History   Occupational History  . Occupation: Pharmacist, hospital  Tobacco Use  . Smoking status: Former Smoker    Packs/day: 0.02    Types: Cigarettes    Last attempt to quit: 10/09/2016    Years since quitting: 1.9  . Smokeless tobacco: Never Used  . Tobacco comment: Quit 10/2016  Substance and Sexual Activity  . Alcohol use: No    Comment: Quit 10/2016  . Drug use: No  .  Sexual activity: Never

## 2018-09-27 NOTE — Telephone Encounter (Signed)
Gel injection bilateral knee

## 2018-09-28 ENCOUNTER — Telehealth (INDEPENDENT_AMBULATORY_CARE_PROVIDER_SITE_OTHER): Payer: Self-pay | Admitting: Orthopaedic Surgery

## 2018-09-28 ENCOUNTER — Other Ambulatory Visit (INDEPENDENT_AMBULATORY_CARE_PROVIDER_SITE_OTHER): Payer: Self-pay | Admitting: Orthopaedic Surgery

## 2018-09-28 MED ORDER — ACETAMINOPHEN-CODEINE #3 300-30 MG PO TABS: 1.0000 | ORAL_TABLET | Freq: Three times a day (TID) | ORAL | 0 refills | Status: DC | PRN

## 2018-09-28 NOTE — Telephone Encounter (Signed)
Please advise 

## 2018-09-28 NOTE — Telephone Encounter (Signed)
Let her know that I can only refill it this one time since it is a narcotic.

## 2018-09-28 NOTE — Telephone Encounter (Signed)
Patient aware of the below message  

## 2018-09-28 NOTE — Telephone Encounter (Signed)
Patient called needing Rx refilled Tylenol 3. The number to contact patient is [phone]

## 2018-10-04 NOTE — Telephone Encounter (Signed)
Noted  

## 2018-10-05 ENCOUNTER — Telehealth (INDEPENDENT_AMBULATORY_CARE_PROVIDER_SITE_OTHER): Payer: Self-pay

## 2018-10-05 NOTE — Telephone Encounter (Signed)
Submitted VOB for Monovisc, bilateral knee. 

## 2018-10-20 ENCOUNTER — Telehealth (INDEPENDENT_AMBULATORY_CARE_PROVIDER_SITE_OTHER): Payer: Self-pay

## 2018-10-20 NOTE — Telephone Encounter (Signed)
Patient is approved for Monovisc, bilateral knee. [employer] After deductible has been met, per medicare, patient will be responsible for 20% of the allowable amount. No Co-pay No PA required  Appt.10/25/2018 with Dr. Ninfa Linden

## 2018-10-25 ENCOUNTER — Ambulatory Visit (INDEPENDENT_AMBULATORY_CARE_PROVIDER_SITE_OTHER): Payer: Medicare Other | Admitting: Orthopaedic Surgery

## 2018-11-24 ENCOUNTER — Other Ambulatory Visit (INDEPENDENT_AMBULATORY_CARE_PROVIDER_SITE_OTHER): Payer: Self-pay | Admitting: Orthopaedic Surgery

## 2018-11-24 NOTE — Telephone Encounter (Signed)
Please advise 

## 2019-02-19 ENCOUNTER — Ambulatory Visit: Payer: Medicare Other | Admitting: Physician Assistant

## 2019-03-01 ENCOUNTER — Ambulatory Visit: Payer: Medicare Other | Admitting: Physician Assistant

## 2019-03-01 ENCOUNTER — Ambulatory Visit: Payer: Medicare Other | Admitting: Orthopaedic Surgery

## 2019-03-05 ENCOUNTER — Encounter: Payer: Self-pay | Admitting: Physician Assistant

## 2019-03-05 ENCOUNTER — Ambulatory Visit (INDEPENDENT_AMBULATORY_CARE_PROVIDER_SITE_OTHER): Payer: Medicare Other | Admitting: Physician Assistant

## 2019-03-05 ENCOUNTER — Other Ambulatory Visit: Payer: Self-pay

## 2019-03-05 VITALS — Ht 65.5 in | Wt 135.0 lb

## 2019-03-05 DIAGNOSIS — M1711 Unilateral primary osteoarthritis, right knee: Secondary | ICD-10-CM

## 2019-03-05 DIAGNOSIS — M1712 Unilateral primary osteoarthritis, left knee: Secondary | ICD-10-CM | POA: Diagnosis not present

## 2019-03-05 MED ORDER — METHYLPREDNISOLONE ACETATE 40 MG/ML IJ SUSP
40.0000 mg | INTRAMUSCULAR | Status: AC | PRN
  Administered 2019-03-05: 40 mg via INTRA_ARTICULAR

## 2019-03-05 MED ORDER — LIDOCAINE HCL 1 % IJ SOLN
0.5000 mL | INTRAMUSCULAR | Status: AC | PRN
  Administered 2019-03-05: .5 mL

## 2019-03-05 MED ORDER — ACETAMINOPHEN-CODEINE #3 300-30 MG PO TABS: 1.0000 | ORAL_TABLET | Freq: Four times a day (QID) | ORAL | 0 refills | Status: DC | PRN

## 2019-03-05 NOTE — Progress Notes (Signed)
   Procedure Note  Patient: Mercedes Gardner             Date of Birth: 1946-06-17           MRN: [id number]             Visit Date: 03/05/2019 HPI: Mercedes Gardner comes in today due to bilateral knee pain.  She is requesting injections both knees.  She is not on chronic anticoagulation Plavix and unable to take NSAIDs been taking just regular Tylenol for knee pain.  She is asking for Tylenol No. 3 which is helped in the past.  Is not wanting to undergo any type surgery for his knees at this point time.  She is known primary arthritis of both knees.  Injections both knees on 09/27/2018 gave her good relief until recently.  She said no new injury to either knee.  Physical exam: Bilateral knees no abnormal warmth erythema.  Overall good range of motion of both knees.  Procedures: Visit Diagnoses:  Left knee osteoarthritis Right knee osteoarthritis  Large Joint Inj: bilateral knee on 03/05/2019 9:09 AM Indications: pain Details: 22 G 1.5 in needle, anterolateral approach  Arthrogram: No  Medications (Right): 0.5 mL lidocaine 1 %; 40 mg methylPREDNISolone acetate 40 MG/ML Medications (Left): 0.5 mL lidocaine 1 %; 40 mg methylPREDNISolone acetate 40 MG/ML Outcome: tolerated well, no immediate complications Procedure, treatment alternatives, risks and benefits explained, specific risks discussed. Consent was given by the patient. Immediately prior to procedure a time out was called to verify the correct patient, procedure, equipment, support staff and site/side marked as required. Patient was prepped and draped in the usual sterile fashion.     Plan: She will follow-up on an as-needed basis she knows she needs to wait at least 3 months between injections.  Did give her refill her Tylenol 3 but I told her that we do not be able to refill this on a continuing basis.  She needs to use this prescription sparingly.

## 2019-03-15 ENCOUNTER — Other Ambulatory Visit: Payer: Self-pay | Admitting: Internal Medicine

## 2019-03-15 DIAGNOSIS — Z1231 Encounter for screening mammogram for malignant neoplasm of breast: Secondary | ICD-10-CM

## 2019-03-30 ENCOUNTER — Telehealth: Payer: Self-pay | Admitting: Orthopaedic Surgery

## 2019-03-30 MED ORDER — ACETAMINOPHEN-CODEINE #3 300-30 MG PO TABS: 1.0000 | ORAL_TABLET | Freq: Three times a day (TID) | ORAL | 0 refills | Status: DC | PRN

## 2019-03-30 NOTE — Telephone Encounter (Signed)
Patient called concerning Rx refill for Tylenol.  Advised patient that Rx for Tylenol was sent to [facility] on [address].

## 2019-03-30 NOTE — Telephone Encounter (Signed)
Patient called and requested a refill of Tylenol  Please call patient to advise.  [phone]

## 2019-03-30 NOTE — Telephone Encounter (Signed)
Please advise 

## 2019-05-05 ENCOUNTER — Other Ambulatory Visit: Payer: Self-pay | Admitting: Orthopaedic Surgery

## 2019-05-07 NOTE — Telephone Encounter (Signed)
Please advise 

## 2019-06-12 ENCOUNTER — Telehealth: Payer: Self-pay | Admitting: Orthopaedic Surgery

## 2019-06-12 ENCOUNTER — Other Ambulatory Visit: Payer: Self-pay | Admitting: Orthopaedic Surgery

## 2019-06-12 NOTE — Telephone Encounter (Signed)
Please advise 

## 2019-06-12 NOTE — Telephone Encounter (Signed)
Patient called. She would like a refill on Tylenol #3. Her call back number is [phone]

## 2019-06-12 NOTE — Telephone Encounter (Signed)
Let her know that this is the last time that I can provide this medication since it is a narcotic.  She will need to get from her PCP, stop it all together, or be referred to pain management

## 2019-06-25 ENCOUNTER — Ambulatory Visit: Payer: Medicare Other | Admitting: Orthopaedic Surgery

## 2019-06-25 ENCOUNTER — Ambulatory Visit: Payer: Medicare Other | Admitting: Physician Assistant

## 2019-06-26 ENCOUNTER — Ambulatory Visit: Payer: Medicare Other | Admitting: Orthopaedic Surgery

## 2019-07-02 ENCOUNTER — Other Ambulatory Visit: Payer: Self-pay

## 2019-07-02 ENCOUNTER — Ambulatory Visit (HOSPITAL_COMMUNITY)
Admission: EM | Admit: 2019-07-02 | Discharge: 2019-07-02 | Disposition: A | Discharge status: Home / Self Care | Payer: Medicare Other | Attending: Family Medicine | Admitting: Family Medicine

## 2019-07-02 ENCOUNTER — Encounter: Payer: Self-pay | Admitting: Physician Assistant

## 2019-07-02 ENCOUNTER — Encounter (HOSPITAL_COMMUNITY): Payer: Self-pay

## 2019-07-02 ENCOUNTER — Ambulatory Visit (INDEPENDENT_AMBULATORY_CARE_PROVIDER_SITE_OTHER): Payer: Medicare Other | Admitting: Physician Assistant

## 2019-07-02 DIAGNOSIS — M1711 Unilateral primary osteoarthritis, right knee: Secondary | ICD-10-CM | POA: Diagnosis not present

## 2019-07-02 DIAGNOSIS — R350 Frequency of micturition: Secondary | ICD-10-CM | POA: Diagnosis not present

## 2019-07-02 DIAGNOSIS — M1712 Unilateral primary osteoarthritis, left knee: Secondary | ICD-10-CM | POA: Diagnosis not present

## 2019-07-02 DIAGNOSIS — H539 Unspecified visual disturbance: Secondary | ICD-10-CM

## 2019-07-02 LAB — POCT URINALYSIS DIP (DEVICE)
Bilirubin Urine: NEGATIVE
Glucose, UA: NEGATIVE mg/dL
Ketones, ur: NEGATIVE mg/dL
Nitrite: NEGATIVE
Protein, ur: NEGATIVE mg/dL
Specific Gravity, Urine: 1.02 (ref 1.005–1.030)
Urobilinogen, UA: 0.2 mg/dL (ref 0.0–1.0)
pH: 5.5 (ref 5.0–8.0)

## 2019-07-02 MED ORDER — FLUORESCEIN SODIUM 1 MG OP STRP
ORAL_STRIP | OPHTHALMIC | Status: AC
  Filled 2019-07-02: qty 1

## 2019-07-02 MED ORDER — FLUCONAZOLE 150 MG PO TABS: 150.0000 mg | ORAL_TABLET | Freq: Every day | ORAL | 0 refills | Status: AC

## 2019-07-02 MED ORDER — CEPHALEXIN 500 MG PO CAPS: 500.0000 mg | ORAL_CAPSULE | Freq: Two times a day (BID) | ORAL | 0 refills | Status: AC

## 2019-07-02 MED ORDER — LIDOCAINE HCL 1 % IJ SOLN
0.5000 mL | INTRAMUSCULAR | Status: AC | PRN
  Administered 2019-07-02: .5 mL

## 2019-07-02 MED ORDER — METHYLPREDNISOLONE ACETATE 40 MG/ML IJ SUSP
40.0000 mg | INTRAMUSCULAR | Status: AC | PRN
  Administered 2019-07-02: 40 mg via INTRA_ARTICULAR

## 2019-07-02 MED ORDER — TETRACAINE HCL 0.5 % OP SOLN
OPHTHALMIC | Status: AC
  Filled 2019-07-02: qty 4
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
## 2019-07-02 NOTE — Discharge Instructions (Addendum)
Go to Dr. Zenia Resides office at 3 pm today for evaluation We will go ahead and treat you for a urinary tract infection today.  Take the medication as prescribed. Make sure you are drinking plenty of fluids and staying hydrated We will call you if we need to make any changes based on your culture results.

## 2019-07-02 NOTE — Progress Notes (Signed)
   Procedure Note  Patient: Mercedes Gardner             Date of Birth: [birth year]           MRN: [id number]             Visit Date: 07/02/2019 HPI: Mercedes Gardner returns today for bilateral knee injections.  She has known primary arthritis of both knees.  He states the injections in the past have been very helpful.  States left knee is bothering him more than the right.  She has had no fevers chills shortness breath chest pain  Physical exam: Bilateral knees no abnormal warmth erythema overall good range of motion both knees.  Tenderness along medial joint line of the left knee.  Varus deformities of both knees. Procedures: Visit Diagnoses:  1. Unilateral primary osteoarthritis, left knee   2. Unilateral primary osteoarthritis, right knee     Large Joint Inj: bilateral knee on 07/02/2019 10:09 AM Indications: pain Details: 22 G 1.5 in needle, anterolateral approach  Arthrogram: No  Medications (Right): 0.5 mL lidocaine 1 %; 40 mg methylPREDNISolone acetate 40 MG/ML Medications (Left): 0.5 mL lidocaine 1 %; 40 mg methylPREDNISolone acetate 40 MG/ML Outcome: tolerated well, no immediate complications Procedure, treatment alternatives, risks and benefits explained, specific risks discussed. Consent was given by the patient. Immediately prior to procedure a time out was called to verify the correct patient, procedure, equipment, support staff and site/side marked as required. Patient was prepped and draped in the usual sterile fashion.    Plan: She will follow-up with [place] in 3 months for repeat injections both knees.  Questions encouraged and answered at length

## 2019-07-03 ENCOUNTER — Encounter (INDEPENDENT_AMBULATORY_CARE_PROVIDER_SITE_OTHER): Payer: Self-pay | Admitting: Ophthalmology

## 2019-07-03 ENCOUNTER — Ambulatory Visit (INDEPENDENT_AMBULATORY_CARE_PROVIDER_SITE_OTHER): Payer: Medicare Other | Admitting: Ophthalmology

## 2019-07-03 DIAGNOSIS — H35033 Hypertensive retinopathy, bilateral: Secondary | ICD-10-CM

## 2019-07-03 DIAGNOSIS — H43812 Vitreous degeneration, left eye: Secondary | ICD-10-CM

## 2019-07-03 DIAGNOSIS — H4312 Vitreous hemorrhage, left eye: Secondary | ICD-10-CM | POA: Diagnosis not present

## 2019-07-03 DIAGNOSIS — I10 Essential (primary) hypertension: Secondary | ICD-10-CM

## 2019-07-03 DIAGNOSIS — H25813 Combined forms of age-related cataract, bilateral: Secondary | ICD-10-CM

## 2019-07-03 DIAGNOSIS — H3581 Retinal edema: Secondary | ICD-10-CM | POA: Diagnosis not present
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
## 2019-07-12 ENCOUNTER — Telehealth (HOSPITAL_COMMUNITY): Payer: Self-pay | Admitting: Emergency Medicine

## 2019-07-12 NOTE — Telephone Encounter (Signed)
Pt called stating she was urinating frequently, but she states "i'm drinking a whole lot of water so that might have something to do with.." Pt instructed that she may return at any time for recheck. Pt states she will come back "just to be sure" everything is okay.

## 2019-07-23 ENCOUNTER — Telehealth: Payer: Self-pay | Admitting: Orthopaedic Surgery

## 2019-07-23 ENCOUNTER — Other Ambulatory Visit: Payer: Self-pay | Admitting: Orthopaedic Surgery

## 2019-07-23 NOTE — Telephone Encounter (Signed)
Please advise, I think I may have already sent you a request for her

## 2019-07-23 NOTE — Telephone Encounter (Signed)
Pt called in requesting a refill on Tylenol #3, please send that medication to [facility] on [address].   [phone]

## 2019-07-23 NOTE — Telephone Encounter (Signed)
Please advise 

## 2019-07-30 ENCOUNTER — Encounter (INDEPENDENT_AMBULATORY_CARE_PROVIDER_SITE_OTHER): Payer: Medicare Other | Admitting: Ophthalmology

## 2019-08-30 ENCOUNTER — Other Ambulatory Visit: Payer: Self-pay | Admitting: Orthopaedic Surgery

## 2019-08-30 NOTE — Telephone Encounter (Signed)
Please advise 

## 2019-10-01 ENCOUNTER — Ambulatory Visit (INDEPENDENT_AMBULATORY_CARE_PROVIDER_SITE_OTHER): Payer: Medicare Other | Admitting: Physician Assistant

## 2019-10-01 ENCOUNTER — Encounter: Payer: Self-pay | Admitting: Physician Assistant

## 2019-10-01 ENCOUNTER — Other Ambulatory Visit: Payer: Self-pay

## 2019-10-01 DIAGNOSIS — M1711 Unilateral primary osteoarthritis, right knee: Secondary | ICD-10-CM

## 2019-10-01 DIAGNOSIS — M1712 Unilateral primary osteoarthritis, left knee: Secondary | ICD-10-CM

## 2019-10-01 DIAGNOSIS — M25511 Pain in right shoulder: Secondary | ICD-10-CM

## 2019-10-01 MED ORDER — METHYLPREDNISOLONE ACETATE 40 MG/ML IJ SUSP
40.0000 mg | INTRAMUSCULAR | Status: AC | PRN
  Administered 2019-10-01: 40 mg via INTRA_ARTICULAR

## 2019-10-01 MED ORDER — LIDOCAINE HCL 1 % IJ SOLN
0.5000 mL | INTRAMUSCULAR | Status: AC | PRN
  Administered 2019-10-01: 10:00:00 .5 mL

## 2019-10-01 MED ORDER — ACETAMINOPHEN-CODEINE #3 300-30 MG PO TABS: ORAL_TABLET | ORAL | 0 refills | Status: DC

## 2019-10-01 NOTE — Progress Notes (Addendum)
   Procedure Note  Patient: Mercedes Gardner             Date of Birth: [birth year]           MRN: [id number]             Visit Date: 10/01/2019  HPI: Mercedes Gardner comes in today requesting bilateral knee injections.  She has known primary arthritis of both knees.  No known injury to either knee.  She is also having some right shoulder pain and she disliked the shoulder looked at.  She woke up this morning is having significant pain in the posterior aspect of the shoulder.  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  She denies any numbness tingling down the arm.  No neck pain.  Physical exam: General well-developed well-nourished female alert and oriented Bilateral knees: Good range of motion no abnormal warmth erythema or effusion Bilateral shoulders: 5 out of 5 strength external/internal rotation against resistance empty can test is negative bilaterally.  Pain with impingement testing right shoulder. Procedures: Visit Diagnoses:  1. Unilateral primary osteoarthritis, left knee   2. Unilateral primary osteoarthritis, right knee   3. Acute pain of right shoulder     Large Joint Inj: bilateral knee on 10/01/2019 9:47 AM Indications: pain Details: 22 G 1.5 in needle, anterolateral approach  Arthrogram: No  Medications (Right): 0.5 mL lidocaine 1 %; 40 mg methylPREDNISolone acetate 40 MG/ML Medications (Left): 0.5 mL lidocaine 1 %; 40 mg methylPREDNISolone acetate 40 MG/ML Outcome: tolerated well, no immediate complications Procedure, treatment alternatives, risks and benefits explained, specific risks discussed. Consent was given by the patient. Immediately prior to procedure a time out was called to verify the correct patient, procedure, equipment, support staff and site/side marked as required. Patient was prepped and draped in the usual sterile fashion.     Plan: She does not want any real work-up of the shoulder today.  Told her if her pain persist we can definitely obtain  x-rays of the shoulder and possibly do an injection.  She shown some shoulder exercises she can perform at home.  She will follow-up with [place] on as-needed basis.  She knows to wait at least 3 months between injections in both knees.

## 2019-10-01 NOTE — Addendum Note (Signed)
Addended by: Erskine Emery on: 10/01/2019 02:30 PM   Modules accepted: Orders

## 2019-10-02 ENCOUNTER — Ambulatory Visit: Payer: Medicare Other | Admitting: Physician Assistant

## 2019-10-10 ENCOUNTER — Telehealth: Payer: Self-pay | Admitting: Orthopaedic Surgery

## 2019-10-10 NOTE — Telephone Encounter (Signed)
Pt called in said disregard message, she contacted her pharmacy and they had a refill ready since the 18th. Pt wanted me to let dr.blackman know so he didn't think she was abusing of the medication. as

## 2019-10-10 NOTE — Telephone Encounter (Signed)
Patient called. She would like a refill on Tylenol 3. Her call back number is [phone]

## 2019-10-22 ENCOUNTER — Encounter (INDEPENDENT_AMBULATORY_CARE_PROVIDER_SITE_OTHER): Payer: Medicare Other | Admitting: Ophthalmology

## 2019-11-04 ENCOUNTER — Ambulatory Visit: Payer: Medicare Other | Attending: Internal Medicine

## 2019-11-04 DIAGNOSIS — Z23 Encounter for immunization: Secondary | ICD-10-CM | POA: Insufficient documentation

## 2019-11-04 NOTE — Progress Notes (Signed)
   Covid-19 Vaccination Clinic  Name:  Mercedes Gardner    MRN: [id number] DOB: 02-24-1946  11/04/2019  [name] was observed post Covid-19 immunization for 15 minutes without incidence. He was provided with Vaccine Information Sheet and instruction to access the V-Safe system.   [name] was instructed to call 911 with any severe reactions post vaccine: [name] Kitchen Difficulty breathing  . Swelling of your face and throat  . A fast heartbeat  . A bad rash all over your body  . Dizziness and weakness    Immunizations Administered    Name Date Dose VIS Date Route   Pfizer COVID-19 Vaccine 11/04/2019  1:49 PM 0.3 mL 08/24/2019 Intramuscular   Manufacturer: [place]   Lot: [id number]   [place]: [id number]

## 2019-11-13 ENCOUNTER — Telehealth: Payer: Self-pay | Admitting: Orthopaedic Surgery

## 2019-11-13 ENCOUNTER — Other Ambulatory Visit: Payer: Self-pay | Admitting: Orthopaedic Surgery

## 2019-11-13 NOTE — Telephone Encounter (Signed)
Patient called.   She needs a refill on her Tylenol #3  Call back: [phone]

## 2019-11-13 NOTE — Telephone Encounter (Signed)
Please advise 

## 2019-11-28 ENCOUNTER — Ambulatory Visit: Payer: Medicare Other

## 2019-12-04 ENCOUNTER — Ambulatory Visit: Payer: Medicare Other

## 2019-12-10 ENCOUNTER — Ambulatory Visit: Payer: Medicare Other | Attending: Internal Medicine

## 2019-12-10 DIAGNOSIS — Z23 Encounter for immunization: Secondary | ICD-10-CM

## 2019-12-25 ENCOUNTER — Other Ambulatory Visit: Payer: Self-pay | Admitting: Orthopaedic Surgery

## 2019-12-25 ENCOUNTER — Telehealth: Payer: Self-pay | Admitting: Orthopaedic Surgery

## 2019-12-25 NOTE — Telephone Encounter (Signed)
Patient called needing Rx refilled Oxycodone. The number to contact is [phone]

## 2019-12-25 NOTE — Telephone Encounter (Signed)
We have not had her on oxycodone and we don't treat arthritis pain or tendonitis with narcotics

## 2019-12-25 NOTE — Telephone Encounter (Signed)
Please advise 

## 2019-12-26 NOTE — Telephone Encounter (Signed)
Patient aware of the below message  

## 2020-01-10 ENCOUNTER — Encounter: Payer: Self-pay | Admitting: Surgery

## 2020-01-10 ENCOUNTER — Ambulatory Visit: Payer: Self-pay

## 2020-01-10 ENCOUNTER — Ambulatory Visit (INDEPENDENT_AMBULATORY_CARE_PROVIDER_SITE_OTHER): Payer: Medicare Other | Admitting: Surgery

## 2020-01-10 ENCOUNTER — Telehealth: Payer: Self-pay | Admitting: Surgery

## 2020-01-10 ENCOUNTER — Other Ambulatory Visit: Payer: Self-pay

## 2020-01-10 DIAGNOSIS — M4726 Other spondylosis with radiculopathy, lumbar region: Secondary | ICD-10-CM

## 2020-01-10 DIAGNOSIS — M2550 Pain in unspecified joint: Secondary | ICD-10-CM

## 2020-01-10 DIAGNOSIS — M79605 Pain in left leg: Secondary | ICD-10-CM | POA: Diagnosis not present

## 2020-01-10 NOTE — Progress Notes (Signed)
 Office Visit Note   Patient: Mercedes Gardner           Date of Birth: 04/16/1946           MRN: [id number] Visit Date: 01/10/2020              Requested by: Avbuere, Edwin, MD [address] ,  [place] [address] PCP: Avbuere, Edwin, MD   Assessment & Plan: Visit Diagnoses:  1. Pain in left leg   2. Other spondylosis with radiculopathy, lumbar region   3. Polyarthralgia     Plan: I reviewed x-rays with patient today.  She does have a history of polyarthralgia with bilateral knee pain.  Question family members with autoimmune disorder.  I recommend getting CBC and arthritis panel today and blood work was done.  In hopes of giving her some relief of her left buttock and lateral hip pain she was given Depo-Medrol 80 mg and Toradol 30 mg IM injections in the clinic.  She can follow with Dr. Blackman in 6 weeks for recheck to see how she is doing.  Patient requested bilateral knee injections today and advised her that she can discuss that with him at follow-up.  Patient was also wanting another refill of Tylenol 3 and she has been getting that from Dr. Blackman's clinic but it looks like she was advised to follow-up with her PCP for narcotic medication.  Follow-Up Instructions: No follow-ups on file.   Orders:  Orders Placed This Encounter  Procedures  . XR Lumbar Spine 2-3 Views  . XR HIP UNILAT W OR W/O PELVIS 1V LEFT  . CBC  . Antinuclear Antib (ANA)  . Sed Rate (ESR)  . Rheumatoid Factor  . Uric acid   No orders of the defined types were placed in this encounter.     Procedures: No procedures performed   Clinical Data: No additional findings.   Subjective: Chief Complaint  Patient presents with  . Left Leg - Pain    HPI 74-year-old black female comes in today with complaints of left buttock and lateral left hip pain.  Patient states pain started 2 days ago.  She was doing some moving of boxes and other items out of her apartment and she noticed pain into the  left buttock and the lateral hip.  Nothing radiating further down the leg.  She has been taking Goody powders for this and also using Voltaren gel.  Denies groin pain or lower extremity numbness tingling..  She has a known history of bilateral knee DJD and is followed by Dr. Blackman for this.  Bilateral knees were injected January 2021.  Review of Systems No current cardiac pulmonary GI GU issues  Objective: Vital Signs: There were no vitals taken for this visit.  Physical Exam HENT:     Head: Normocephalic and atraumatic.  Pulmonary:     Effort: No respiratory distress.  Musculoskeletal:     Comments: Gait is normal.  No lumbar paraspinal tenderness.  Patient is nontender over the bilateral SI joints.  Mild to moderate left-sided notch tenderness.  Mild tenderness over the left hip greater trochanter bursa.  Negative logroll bilateral hips.  Negative straight leg raise.  Tender.  NeuroVas Intact.  No Focal Motor Deficits.  Skin:    General: Skin is warm and dry.  Neurological:     General: No focal deficit present.     Mental Status: He is alert and oriented to person, place, and time.  Psychiatric:          Mood and Affect: Mood normal.     Ortho Exam  Specialty Comments:  No specialty comments available.  Imaging: No results found.   PMFS History: Patient Active Problem List   Diagnosis Date Noted  . Unilateral primary osteoarthritis, right knee 03/05/2019  . Tuberculosis screening 06/08/2017  . Generalized anxiety disorder 03/24/2017  . Eczema of both upper extremities 03/24/2017  . Ventral hernia without obstruction or gangrene 03/23/2017  . Healthcare maintenance 12/07/2016  . Incidental lung nodule, greater than or equal to 8mm   . History of stroke   . Normocytic anemia 10/11/2016  . Personal history of gout 12/24/2008  . ALLERGIC RHINITIS 12/20/2008  . Unilateral primary osteoarthritis, left knee 12/20/2008  . TOBACCO ABUSE 07/29/2006  . Essential hypertension  07/29/2006   Past Medical History:  Diagnosis Date  . Anemia   . Anxiety   . Arthritis    knees   . Dysrhythmia    heart skips a beat   . GERD (gastroesophageal reflux disease)   . Gout   . Hyperlipidemia April 2014  . Hypertension   . Ovarian cyst    removed during mini lap 2016, benign path, Dr Rossi  . Shortness of breath dyspnea    due to pressure of cyst per patient   . Stroke (HCC)   . 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 TIA (transient ischemic attack)     Family History  Problem Relation Age of Onset  . Hypertension Mother   . Diabetes Mother   . Breast cancer Mother   . Diabetes Sister   . Hypertension Sister   . Other Father        accident    Past Surgical History:  Procedure Laterality Date  . CESAREAN SECTION     x3  . IR GENERIC HISTORICAL  11/30/2016   IR ANGIO VERTEBRAL SEL VERTEBRAL UNI L MOD SED 11/30/2016 Sanjeev Deveshwar, MD [facility]  . IR GENERIC HISTORICAL  11/30/2016   IR ANGIO INTRA EXTRACRAN SEL COM CAROTID INNOMINATE BILAT MOD SED 11/30/2016 Sanjeev Deveshwar, MD [facility]  . IR GENERIC HISTORICAL  11/30/2016   IR ANGIO VERTEBRAL SEL SUBCLAVIAN INNOMINATE UNI R MOD SED 11/30/2016 Sanjeev Deveshwar, MD [facility]  . OVARIAN CYST SURGERY Right 1980's  . SALPINGOOPHORECTOMY Bilateral 02/06/2015   Procedure: EXPLORAROTORY LAPAROTOMY/BILATERAL SALPINGO OOPHORECTOMY;  Surgeon: Emma Rossi, MD;  Location: [facility];  Service: Gynecology;  Laterality: Bilateral;   Social History   Occupational History  . Occupation: Teacher  Tobacco Use  . Smoking status: Current Every Day Smoker    Packs/day: 0.02    Types: Cigarettes  . Smokeless tobacco: Never Used  Substance and Sexual Activity  . Alcohol use: No    Comment: Quit 10/2016  . Drug use: No  . Sexual activity: Never       

## 2020-01-10 NOTE — Telephone Encounter (Signed)
Patient called. She would like to know if she can return to work. Her phone number is [phone]

## 2020-01-11 NOTE — Telephone Encounter (Signed)
LMOM for patient of the below message  

## 2020-01-11 NOTE — Telephone Encounter (Signed)
She can go when she feels she is ready.  I saw her for buttock and lateral hip pain

## 2020-01-14 LAB — CBC
HCT: 41.6 % (ref 35.0–45.0)
Hemoglobin: 13.4 g/dL (ref 11.7–15.5)
MCH: 31 pg (ref 27.0–33.0)
MCHC: 32.2 g/dL (ref 32.0–36.0)
MCV: 96.3 fL (ref 80.0–100.0)
MPV: 11.7 fL (ref 7.5–12.5)
Platelets: 202 10*3/uL (ref 140–400)
RBC: 4.32 10*6/uL (ref 3.80–5.10)
RDW: 12.1 % (ref 11.0–15.0)
WBC: 8.1 10*3/uL (ref 3.8–10.8)

## 2020-01-14 LAB — URIC ACID: Uric Acid, Serum: 5.1 mg/dL (ref 2.5–7.0)

## 2020-01-14 LAB — ANTI-NUCLEAR AB-TITER (ANA TITER)
ANA TITER: 1:40 {titer} — ABNORMAL HIGH
ANA Titer 1: 1:40 {titer} — ABNORMAL HIGH

## 2020-01-14 LAB — SEDIMENTATION RATE: Sed Rate: 11 mm/h (ref 0–30)

## 2020-01-14 LAB — RHEUMATOID FACTOR: Rheumatoid fact SerPl-aCnc: <14 IU/mL (ref <14)

## 2020-01-14 LAB — ANA: Anti Nuclear Antibody (ANA): POSITIVE — AB

## 2020-01-29 ENCOUNTER — Other Ambulatory Visit: Payer: Self-pay | Admitting: Internal Medicine

## 2020-01-29 DIAGNOSIS — E2839 Other primary ovarian failure: Secondary | ICD-10-CM

## 2020-01-31 ENCOUNTER — Other Ambulatory Visit: Payer: Self-pay | Admitting: Orthopaedic Surgery

## 2020-01-31 ENCOUNTER — Telehealth: Payer: Self-pay

## 2020-01-31 ENCOUNTER — Telehealth: Payer: Self-pay | Admitting: Orthopaedic Surgery

## 2020-01-31 MED ORDER — TRAMADOL HCL 50 MG PO TABS: 50.0000 mg | ORAL_TABLET | Freq: Four times a day (QID) | ORAL | 0 refills | Status: DC | PRN

## 2020-01-31 MED ORDER — ACETAMINOPHEN-CODEINE #3 300-30 MG PO TABS: 1.0000 | ORAL_TABLET | Freq: Three times a day (TID) | ORAL | 0 refills | Status: DC | PRN

## 2020-01-31 NOTE — Telephone Encounter (Signed)
I sent in some tramadol.  That is all I can prescribe.

## 2020-01-31 NOTE — Telephone Encounter (Signed)
Patient called advised she is allergic to Tramadol. Patient said it is listed in her chart by her PCP. The number to contact patient is [phone]

## 2020-01-31 NOTE — Telephone Encounter (Signed)
Patient called stating that she is in a lot pain on the left side of her left leg.  Would like to have a Rx for pain?  Cb# is [phone].  Please advise.  Thank you.

## 2020-01-31 NOTE — Telephone Encounter (Signed)
Please advise 

## 2020-01-31 NOTE — Telephone Encounter (Signed)
I will plan to send in some Tylenol 3 for her to use sparingly.

## 2020-02-21 ENCOUNTER — Telehealth: Payer: Self-pay | Admitting: Physician Assistant

## 2020-02-21 ENCOUNTER — Ambulatory Visit: Payer: Medicare Other | Admitting: Physician Assistant

## 2020-02-21 MED ORDER — ACETAMINOPHEN-CODEINE #3 300-30 MG PO TABS: 1.0000 | ORAL_TABLET | Freq: Three times a day (TID) | ORAL | 0 refills | Status: DC | PRN

## 2020-02-21 NOTE — Telephone Encounter (Signed)
Patient called needing Rx refilled (Tylenol 3) Patient had to cancel her appointment today advised she is a n The number to contact patient is a Nannie and couldn't make her appointment today. [phone]

## 2020-03-05 ENCOUNTER — Ambulatory Visit: Payer: Medicare Other | Admitting: Physician Assistant

## 2020-04-17 ENCOUNTER — Telehealth: Payer: Self-pay | Admitting: Orthopedic Surgery

## 2020-04-17 ENCOUNTER — Other Ambulatory Visit: Payer: Self-pay | Admitting: Orthopaedic Surgery

## 2020-04-17 NOTE — Telephone Encounter (Signed)
Please advise, refill.

## 2020-04-17 NOTE — Telephone Encounter (Signed)
Mercedes Gardner called in to schedule an appointment with Artis Delay because she has re-injured her back.  She will be coming in on Monday 04/21/2020, but would like a refill of Tylenol III in the meantime.  She uses the [facility] on [address].

## 2020-04-18 NOTE — Telephone Encounter (Signed)
LVM advising sent to pharmacy

## 2020-04-21 ENCOUNTER — Ambulatory Visit: Payer: Medicare Other | Admitting: Physician Assistant

## 2020-04-24 ENCOUNTER — Ambulatory Visit (INDEPENDENT_AMBULATORY_CARE_PROVIDER_SITE_OTHER): Payer: Medicare Other | Admitting: Orthopaedic Surgery

## 2020-04-24 ENCOUNTER — Encounter: Payer: Self-pay | Admitting: Orthopaedic Surgery

## 2020-04-24 DIAGNOSIS — M25552 Pain in left hip: Secondary | ICD-10-CM

## 2020-04-24 DIAGNOSIS — M79605 Pain in left leg: Secondary | ICD-10-CM

## 2020-04-24 MED ORDER — METHYLPREDNISOLONE 4 MG PO TABS: ORAL_TABLET | ORAL | 0 refills | Status: DC

## 2020-04-24 NOTE — Progress Notes (Signed)
The patient is well-known to [place].  She has seen several of the physicians in the office with mainly being me.  She comes in today with low back pain and left hip pain after she moved a box and she felt like she pulled something.  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  She does take Tylenol 3 on occasion.  She states she had really bad hip pain for 2 days but now it has gotten somewhat better.  She cannot take anti-inflammatories because she is on Plavix.  She had no other acute change in her medical status.  She currently denies any headache, chest pain, shortness of breath, fever, chills, nausea, vomiting.  On exam she is alert and orient x3 and in no acute distress.  I can easily put her left hip through internal and external rotation with really no significant pain other than just a little bit of pain over the trochanteric area and the low back to the left side but otherwise she is walking without an assistive device and has excellent strength in the bilateral lower extremities.  Given that her pain is coming down I have recommended a 6-day steroid taper with a Medrol Dosepak.  I think that will help her the most and she cannot take anti-inflammatories and she is not a diabetic.  She agrees with this treatment plan.  All questions and concerns were answered and addressed.  I still do not mind occasionally providing some Tylenol 3 as long as she is using it sparingly.  Follow-up is as needed.

## 2020-04-25 ENCOUNTER — Telehealth: Payer: Self-pay | Admitting: Orthopaedic Surgery

## 2020-04-28 NOTE — Telephone Encounter (Signed)
Opened in Error.

## 2020-05-07 ENCOUNTER — Other Ambulatory Visit: Payer: Self-pay | Admitting: Physician Assistant

## 2020-05-07 NOTE — Telephone Encounter (Signed)
Please advise 

## 2020-05-12 ENCOUNTER — Telehealth: Payer: Self-pay | Admitting: Orthopaedic Surgery

## 2020-05-12 NOTE — Telephone Encounter (Signed)
Mercedes Gardner [facility] will be faxing [place] some paperwork and he would like to have it faxed back today is possible   Mercedes Gardner CB# [phone]

## 2020-05-12 NOTE — Telephone Encounter (Signed)
Got it, will have Ninfa Linden sign and we will fax back

## 2020-05-16 ENCOUNTER — Telehealth: Payer: Self-pay

## 2020-05-16 NOTE — Telephone Encounter (Signed)
Do you have anything for this pt?

## 2020-05-16 NOTE — Telephone Encounter (Signed)
[facility] called regarding paper work that was faxed here  he needs faxed back to him ASAP. Call back [phone]

## 2020-05-20 NOTE — Telephone Encounter (Signed)
Do you still have this paperwork?

## 2020-05-20 NOTE — Telephone Encounter (Signed)
Spoke with Mercedes Gardner and let him know Dr. Ninfa Linden said he wouldn't feel out these forms because he didn't order this

## 2020-06-10 ENCOUNTER — Other Ambulatory Visit: Payer: Self-pay | Admitting: Orthopaedic Surgery

## 2020-06-10 NOTE — Telephone Encounter (Signed)
Please advise 

## 2020-07-17 ENCOUNTER — Ambulatory Visit (INDEPENDENT_AMBULATORY_CARE_PROVIDER_SITE_OTHER): Payer: Medicare (Managed Care) | Admitting: Orthopaedic Surgery

## 2020-07-17 ENCOUNTER — Encounter: Payer: Self-pay | Admitting: Orthopaedic Surgery

## 2020-07-17 DIAGNOSIS — M25562 Pain in left knee: Secondary | ICD-10-CM | POA: Diagnosis not present

## 2020-07-17 DIAGNOSIS — M25561 Pain in right knee: Secondary | ICD-10-CM | POA: Diagnosis not present

## 2020-07-17 DIAGNOSIS — G8929 Other chronic pain: Secondary | ICD-10-CM | POA: Diagnosis not present

## 2020-07-17 MED ORDER — METHYLPREDNISOLONE ACETATE 40 MG/ML IJ SUSP
40.0000 mg | INTRAMUSCULAR | Status: AC | PRN
  Administered 2020-07-17: 40 mg via INTRA_ARTICULAR

## 2020-07-17 MED ORDER — LIDOCAINE HCL 1 % IJ SOLN
3.0000 mL | INTRAMUSCULAR | Status: AC | PRN
  Administered 2020-07-17: 3 mL

## 2020-07-17 MED ORDER — ACETAMINOPHEN-CODEINE #3 300-30 MG PO TABS: ORAL_TABLET | ORAL | 0 refills | Status: DC

## 2020-07-17 NOTE — Progress Notes (Signed)
Office Visit Note   Patient: Mercedes Gardner           Date of Birth: 21-Mar-1946           MRN: [id number] Visit Date: 07/17/2020              Requested by: Nolene Ebbs, MD [address] [place],  [place] [address] PCP: Nolene Ebbs, MD   Assessment & Plan: Visit Diagnoses:  1. Chronic pain of left knee   2. Chronic pain of right knee     Plan: Per the patient's wish I did provide a steroid injection in both knees which she tolerated well.  I will send in a one-time prescription for Tylenol 3.  All question concerns were answered addressed.  She knows to at least 3 to 4 months between steroid injections.  Also offered her knee replacement surgery which she declined.  Follow-Up Instructions: Return if symptoms worsen or fail to improve.   Orders:  Orders Placed This Encounter  Procedures  . Large Joint Inj  . Large Joint Inj   Meds ordered this encounter  Medications  . acetaminophen-codeine (TYLENOL #3) 300-30 MG tablet    Sig: TAKE 1 TO 2 TABLETS BY MOUTH EVERY 8 HOURS AS NEEDED FOR MODERATE PAIN    Dispense:  30 tablet    Refill:  0      Procedures: Large Joint Inj: R knee on 07/17/2020 3:28 PM Indications: diagnostic evaluation and pain Details: 22 G 1.5 in needle, superolateral approach  Arthrogram: No  Medications: 3 mL lidocaine 1 %; 40 mg methylPREDNISolone acetate 40 MG/ML Outcome: tolerated well, no immediate complications Procedure, treatment alternatives, risks and benefits explained, specific risks discussed. Consent was given by the patient. Immediately prior to procedure a time out was called to verify the correct patient, procedure, equipment, support staff and site/side marked as required. Patient was prepped and draped in the usual sterile fashion.   Large Joint Inj: L knee on 07/17/2020 3:29 PM Indications: diagnostic evaluation and pain Details: 22 G 1.5 in needle, superolateral approach  Arthrogram: No  Medications: 3 mL lidocaine 1 %;  40 mg methylPREDNISolone acetate 40 MG/ML Outcome: tolerated well, no immediate complications Procedure, treatment alternatives, risks and benefits explained, specific risks discussed. Consent was given by the patient. Immediately prior to procedure a time out was called to verify the correct patient, procedure, equipment, support staff and site/side marked as required. Patient was prepped and draped in the usual sterile fashion.       Clinical Data: No additional findings.   Subjective: Chief Complaint  Patient presents with  . Right Knee - Pain  . Left Knee - Pain  The patient is well-known to [place].  She has severe arthritis in both knees and occasionally comes in for steroid injections.  She says the injections are last about 2 to 3 months.  It has been over 10 months since we injected her knees.  She is requesting a short course of Tylenol 3 and injections today.  She is moved recently and this is because of flareup of her knee pain and back pain.  She has had no other acute change in her medical status.  She is very thin individual.  HPI  Review of Systems She currently denies any headache, chest pain, shortness of breath, fever, chills, nausea, vomiting  Objective: Vital Signs: There were no vitals taken for this visit.  Physical Exam She is alert and orient x3 and in no acute distress  Ortho Exam Examination of both knees shows no effusion.  Both knees have significant varus malalignment and obvious severe arthritis.  There is a lot of patellofemoral crepitation with both knees. Specialty Comments:  No specialty comments available.  Imaging: No results found.   PMFS History: Patient Active Problem List   Diagnosis Date Noted  . Unilateral primary osteoarthritis, right knee 03/05/2019  . Tuberculosis screening 06/08/2017  . Generalized anxiety disorder 03/24/2017  . Eczema of both upper extremities 03/24/2017  . Ventral hernia without obstruction or gangrene 03/23/2017   . Healthcare maintenance 12/07/2016  . Incidental lung nodule, greater than or equal to 43mm   . History of stroke   . Normocytic anemia 10/11/2016  . Personal history of gout 12/24/2008  . ALLERGIC RHINITIS 12/20/2008  . Unilateral primary osteoarthritis, left knee 12/20/2008  . TOBACCO ABUSE 07/29/2006  . Essential hypertension 07/29/2006   Past Medical History:  Diagnosis Date  . Anemia   . Anxiety   . Arthritis    knees   . Dysrhythmia    heart skips a beat   . GERD (gastroesophageal reflux disease)   . Gout   . Hyperlipidemia April 2014  . Hypertension   . Ovarian cyst    removed during mini lap 2016, benign path, Dr Denman George  . Shortness of breath dyspnea    due to pressure of cyst per patient   . Stroke ([place])   . 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 TIA (transient ischemic attack)     Family History  Problem Relation Age of Onset  . Hypertension Mother   . Diabetes Mother   . Breast cancer Mother   . Diabetes Sister   . Hypertension Sister   . Other Father        accident    Past Surgical History:  Procedure Laterality Date  . CESAREAN SECTION     x3  . IR GENERIC HISTORICAL  11/30/2016   IR ANGIO VERTEBRAL SEL VERTEBRAL UNI L MOD SED 11/30/2016 Luanne Bras, MD [facility]  . IR GENERIC HISTORICAL  11/30/2016   IR ANGIO INTRA EXTRACRAN SEL COM CAROTID INNOMINATE BILAT MOD SED 11/30/2016 Luanne Bras, MD [facility]  . IR GENERIC HISTORICAL  11/30/2016   IR ANGIO VERTEBRAL SEL SUBCLAVIAN INNOMINATE UNI R MOD SED 11/30/2016 Luanne Bras, MD [facility]  . OVARIAN CYST SURGERY Right 1980's  . SALPINGOOPHORECTOMY Bilateral 02/06/2015   Procedure: Campbell Stall LAPAROTOMY/BILATERAL SALPINGO OOPHORECTOMY;  Surgeon: Everitt Amber, MD;  Location: [facility];  Service: Gynecology;  Laterality: Bilateral;   Social History   Occupational History  . Occupation: Pharmacist, hospital  Tobacco Use  . Smoking status: Current Every Day Smoker    Packs/day:  0.02    Types: Cigarettes  . Smokeless tobacco: Never Used  Vaping Use  . Vaping Use: Never used  Substance and Sexual Activity  . Alcohol use: No    Comment: Quit 10/2016  . Drug use: No  . Sexual activity: Never

## 2020-08-04 ENCOUNTER — Ambulatory Visit: Payer: Medicare (Managed Care) | Admitting: Orthopedic Surgery

## 2020-08-13 ENCOUNTER — Ambulatory Visit: Payer: Medicare (Managed Care) | Admitting: Orthopaedic Surgery

## 2020-08-14 ENCOUNTER — Encounter: Payer: Self-pay | Admitting: Physician Assistant

## 2020-08-14 ENCOUNTER — Ambulatory Visit: Payer: Medicaid Other | Admitting: Orthopedic Surgery

## 2020-08-14 ENCOUNTER — Ambulatory Visit (INDEPENDENT_AMBULATORY_CARE_PROVIDER_SITE_OTHER): Payer: Medicare (Managed Care) | Admitting: Orthopedic Surgery

## 2020-08-14 DIAGNOSIS — M79672 Pain in left foot: Secondary | ICD-10-CM | POA: Diagnosis not present

## 2020-08-18 ENCOUNTER — Telehealth: Payer: Self-pay

## 2020-08-18 NOTE — Telephone Encounter (Signed)
Holding for Autumn 

## 2020-08-18 NOTE — Telephone Encounter (Signed)
Mercedes Gardner called stating he faxed over paperwork and stated he hasn't heard anything about the paperwork. He stated he is faxing over forms again today. He would like a call back. [place]

## 2020-08-20 NOTE — Telephone Encounter (Signed)
Holding for Mercedes Gardner. I called the number provided and this is an order for a hip support that is needing a signature from CB. I advised I that did send a message to medical records to see if they have info on this pt and that I would send message to assistant to see if rx pending signature at desk. This is not a MD patient

## 2020-08-20 NOTE — Telephone Encounter (Signed)
I do not have any paperwork for signature do you have anything?

## 2020-08-20 NOTE — Telephone Encounter (Signed)
Issac called stating he has been trying to fax [place] forms regarding the pt but we aren't getting them and anytime he calls to check on this he gets the run around so Issac is asking to speak with Autumn in hopes of getting this resolved. Issac also stated he was going to refax the paper work sending it to our [place] fax as well as the [address]52  Issac CB# [phone]

## 2020-08-21 NOTE — Telephone Encounter (Signed)
Faxed back denied, as not ordered by Dr. Ninfa Linden

## 2020-09-01 ENCOUNTER — Ambulatory Visit: Payer: Medicare (Managed Care) | Admitting: Orthopaedic Surgery

## 2020-09-02 ENCOUNTER — Encounter: Payer: Self-pay | Admitting: Orthopedic Surgery

## 2020-09-02 ENCOUNTER — Other Ambulatory Visit: Payer: Self-pay | Admitting: Orthopaedic Surgery

## 2020-09-02 NOTE — Progress Notes (Signed)
Office Visit Note   Patient: Mercedes Gardner           Date of Birth: 11/02/1945           MRN: [id number] Visit Date: 08/14/2020              Requested by: Nolene Ebbs, MD [address] [place],  [place] [address] PCP: Nolene Ebbs, MD  Chief Complaint  Patient presents with  . Left Foot - Pain      HPI: Patient is a 74 year old woman who presents complaining of painful calluses on the left foot.  Patient is concerned she may have infections or if she gets a Associate Professor.  Assessment & Plan: Visit Diagnoses:  1. Pain in left foot     Plan: Patient will follow up as needed recommend a stiff soled wide sneaker to unload pressure from the forefoot.  Follow-Up Instructions: Return if symptoms worsen or fail to improve.   Ortho Exam  Patient is alert, oriented, no adenopathy, well-dressed, normal affect, normal respiratory effort. Examination patient's foot has no redness no cellulitis no signs of infection she has a callus beneath the first metatarsal head as well as the great toe the calluses were pared x2 no signs of infection.  Imaging: No results found. No images are attached to the encounter.  Labs: Lab Results  Component Value Date   HGBA1C 5.4 10/12/2016   ESRSEDRATE 11 01/10/2020   LABURIC 5.1 01/10/2020   LABURIC 4.9 10/11/2016   LABURIC 9.7 (H) 09/14/2011   REPTSTATUS 10/25/2017 FINAL 10/23/2017   CULT 40,000 COLONIES/mL ESCHERICHIA COLI (A) 10/23/2017   LABORGA ESCHERICHIA COLI (A) 10/23/2017     Lab Results  Component Value Date   ALBUMIN 3.9 11/29/2016   ALBUMIN 3.3 (L) 10/15/2016   ALBUMIN 3.2 (L) 10/12/2016   LABURIC 5.1 01/10/2020   LABURIC 4.9 10/11/2016   LABURIC 9.7 (H) 09/14/2011    Lab Results  Component Value Date   MG 2.2 10/11/2016   No results found for: VD25OH  No results found for: PREALBUMIN CBC EXTENDED Latest Ref Rng & Units 01/10/2020 12/01/2016 11/30/2016  WBC 3.8 - 10.8 Thousand/uL 8.1 7.2 7.5  RBC 3.80 - 5.10  Million/uL 4.32 3.92 3.85(L)  HGB 11.7 - 15.5 g/dL 13.4 11.7(L) 11.4(L)  HCT 35.0 - 45.0 % 41.6 36.9 36.0  PLT 140 - 400 Thousand/uL 202 204 210  NEUTROABS 1.7 - 7.7 K/uL - - -  LYMPHSABS 0.7 - 4.0 K/uL - - -     There is no height or weight on file to calculate BMI.  Orders:  No orders of the defined types were placed in this encounter.  No orders of the defined types were placed in this encounter.    Procedures: No procedures performed  Clinical Data: No additional findings.  ROS:  All other systems negative, except as noted in the HPI. Review of Systems  Objective: Vital Signs: There were no vitals taken for this visit.  Specialty Comments:  No specialty comments available.  PMFS History: Patient Active Problem List   Diagnosis Date Noted  . Unilateral primary osteoarthritis, right knee 03/05/2019  . Tuberculosis screening 06/08/2017  . Generalized anxiety disorder 03/24/2017  . Eczema of both upper extremities 03/24/2017  . Ventral hernia without obstruction or gangrene 03/23/2017  . Healthcare maintenance 12/07/2016  . Incidental lung nodule, greater than or equal to 43mm   . History of stroke   . Normocytic anemia 10/11/2016  . Personal history of gout 12/24/2008  .  ALLERGIC RHINITIS 12/20/2008  . Unilateral primary osteoarthritis, left knee 12/20/2008  . TOBACCO ABUSE 07/29/2006  . Essential hypertension 07/29/2006   Past Medical History:  Diagnosis Date  . Anemia   . Anxiety   . Arthritis    knees   . Dysrhythmia    heart skips a beat   . GERD (gastroesophageal reflux disease)   . Gout   . Hyperlipidemia April 2014  . Hypertension   . Ovarian cyst    removed during mini lap 2016, benign path, Dr Andrey Farmer  . Shortness of breath dyspnea    due to pressure of cyst per patient   . Stroke ([facility])   . 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 TIA (transient ischemic attack)     Family History  Problem Relation Age of Onset  .  Hypertension Mother   . Diabetes Mother   . Breast cancer Mother   . Diabetes Sister   . Hypertension Sister   . Other Father        accident    Past Surgical History:  Procedure Laterality Date  . CESAREAN SECTION     x3  . IR GENERIC HISTORICAL  11/30/2016   IR ANGIO VERTEBRAL SEL VERTEBRAL UNI L MOD SED 11/30/2016 Julieanne Cotton, MD [facility]  . IR GENERIC HISTORICAL  11/30/2016   IR ANGIO INTRA EXTRACRAN SEL COM CAROTID INNOMINATE BILAT MOD SED 11/30/2016 Julieanne Cotton, MD [facility]  . IR GENERIC HISTORICAL  11/30/2016   IR ANGIO VERTEBRAL SEL SUBCLAVIAN INNOMINATE UNI R MOD SED 11/30/2016 Julieanne Cotton, MD [facility]  . OVARIAN CYST SURGERY Right 1980's  . SALPINGOOPHORECTOMY Bilateral 02/06/2015   Procedure: Benay Pillow LAPAROTOMY/BILATERAL SALPINGO OOPHORECTOMY;  Surgeon: Adolphus Birchwood, MD;  Location: [facility];  Service: Gynecology;  Laterality: Bilateral;   Social History   Occupational History  . Occupation: Runner, broadcasting/film/video  Tobacco Use  . Smoking status: Current Every Day Smoker    Packs/day: 0.02    Types: Cigarettes  . Smokeless tobacco: Never Used  Vaping Use  . Vaping Use: Never used  Substance and Sexual Activity  . Alcohol use: No    Comment: Quit 10/2016  . Drug use: No  . Sexual activity: Never

## 2020-09-03 ENCOUNTER — Encounter (INDEPENDENT_AMBULATORY_CARE_PROVIDER_SITE_OTHER): Payer: Self-pay | Admitting: Ophthalmology

## 2020-09-03 NOTE — Telephone Encounter (Signed)
Please advise 

## 2020-09-04 ENCOUNTER — Encounter (INDEPENDENT_AMBULATORY_CARE_PROVIDER_SITE_OTHER): Payer: Medicare (Managed Care) | Admitting: Ophthalmology

## 2020-10-23 ENCOUNTER — Other Ambulatory Visit: Payer: Self-pay | Admitting: Orthopaedic Surgery

## 2020-12-15 ENCOUNTER — Ambulatory Visit (INDEPENDENT_AMBULATORY_CARE_PROVIDER_SITE_OTHER): Payer: Medicare (Managed Care) | Admitting: Physician Assistant

## 2020-12-15 ENCOUNTER — Other Ambulatory Visit: Payer: Self-pay

## 2020-12-15 ENCOUNTER — Encounter: Payer: Self-pay | Admitting: Physician Assistant

## 2020-12-15 DIAGNOSIS — M170 Bilateral primary osteoarthritis of knee: Secondary | ICD-10-CM

## 2020-12-15 DIAGNOSIS — M1712 Unilateral primary osteoarthritis, left knee: Secondary | ICD-10-CM | POA: Diagnosis not present

## 2020-12-15 DIAGNOSIS — M1711 Unilateral primary osteoarthritis, right knee: Secondary | ICD-10-CM

## 2020-12-15 MED ORDER — LIDOCAINE HCL 1 % IJ SOLN
3.0000 mL | INTRAMUSCULAR | Status: AC | PRN
  Administered 2020-12-15: 3 mL

## 2020-12-15 MED ORDER — METHYLPREDNISOLONE ACETATE 40 MG/ML IJ SUSP
40.0000 mg | INTRAMUSCULAR | Status: AC | PRN
  Administered 2020-12-15: 40 mg via INTRA_ARTICULAR

## 2020-12-15 NOTE — Progress Notes (Signed)
Office Visit Note   Patient: Mercedes Gardner           Date of Birth: Aug 06, 1946           MRN: [id number] Visit Date: 12/15/2020              Requested by: Nolene Ebbs, MD [address] [place],  [place] [address] PCP: Nolene Ebbs, MD   Assessment & Plan: Visit Diagnoses:  1. Unilateral primary osteoarthritis, left knee   2. Unilateral primary osteoarthritis, right knee     Plan: She knows to wait at least 3 months between injections.  She will follow up with [place] as needed.  Questions encouraged and answered.  Follow-Up Instructions: Return if symptoms worsen or fail to improve.   Orders:  Orders Placed This Encounter  Procedures  . Large Joint Inj   No orders of the defined types were placed in this encounter.     Procedures: Large Joint Inj on 12/15/2020 9:32 AM Indications: pain Details: 22 G 1.5 in needle, anterolateral approach  Arthrogram: No  Medications: 3 mL lidocaine 1 %; 40 mg methylPREDNISolone acetate 40 MG/ML Outcome: tolerated well, no immediate complications Procedure, treatment alternatives, risks and benefits explained, specific risks discussed. Consent was given by the patient. Immediately prior to procedure a time out was called to verify the correct patient, procedure, equipment, support staff and site/side marked as required. Patient was prepped and draped in the usual sterile fashion.       Clinical Data: No additional findings.   Subjective: Chief Complaint  Patient presents with  . Right Knee - Pain  . Left Knee - Pain    HPI Mercedes Gardner is well-known to Dr. Ninfa Linden service comes in today requesting cortisone injection both knees last injections were given 07/17/2020.  She has had no new injuries.  She denies any changes in her health status or medications.  She has had no fevers or chills.  No recent vaccines.  She has known severe arthritis of both knees.  Notes that the last injections helped until a month and 1/2 to 2 months  ago. Review of Systems See HPI.  Objective: Vital Signs: There were no vitals taken for this visit.  Physical Exam Constitutional:      Appearance: He is normal weight. He is not ill-appearing or diaphoretic.  Pulmonary:     Effort: Pulmonary effort is normal.  Neurological:     Mental Status: He is alert and oriented to person, place, and time.  Psychiatric:        Mood and Affect: Mood normal.     Ortho Exam Bilateral knees no abnormal warmth erythema.  Varus deformity both knees.  Patellofemoral crepitus both knees with range of motion.  Overall good range of motion both knees. Specialty Comments:  No specialty comments available.  Imaging: No results found.   PMFS History: Patient Active Problem List   Diagnosis Date Noted  . Unilateral primary osteoarthritis, right knee 03/05/2019  . Tuberculosis screening 06/08/2017  . Generalized anxiety disorder 03/24/2017  . Eczema of both upper extremities 03/24/2017  . Ventral hernia without obstruction or gangrene 03/23/2017  . Healthcare maintenance 12/07/2016  . Incidental lung nodule, greater than or equal to 75mm   . History of stroke   . Normocytic anemia 10/11/2016  . Personal history of gout 12/24/2008  . ALLERGIC RHINITIS 12/20/2008  . Unilateral primary osteoarthritis, left knee 12/20/2008  . TOBACCO ABUSE 07/29/2006  . Essential hypertension 07/29/2006   Past  Medical History:  Diagnosis Date  . Anemia   . Anxiety   . Arthritis    knees   . Dysrhythmia    heart skips a beat   . GERD (gastroesophageal reflux disease)   . Gout   . Hyperlipidemia April 2014  . Hypertension   . Ovarian cyst    removed during mini lap 2016, benign path, Dr Denman George  . Shortness of breath dyspnea    due to pressure of cyst per patient   . Stroke ([place])   . 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 TIA (transient ischemic attack)     Family History  Problem Relation Age of Onset  . Hypertension Mother   .  Diabetes Mother   . Breast cancer Mother   . Diabetes Sister   . Hypertension Sister   . Other Father        accident    Past Surgical History:  Procedure Laterality Date  . CESAREAN SECTION     x3  . IR GENERIC HISTORICAL  11/30/2016   IR ANGIO VERTEBRAL SEL VERTEBRAL UNI L MOD SED 11/30/2016 Luanne Bras, MD [facility]  . IR GENERIC HISTORICAL  11/30/2016   IR ANGIO INTRA EXTRACRAN SEL COM CAROTID INNOMINATE BILAT MOD SED 11/30/2016 Luanne Bras, MD [facility]  . IR GENERIC HISTORICAL  11/30/2016   IR ANGIO VERTEBRAL SEL SUBCLAVIAN INNOMINATE UNI R MOD SED 11/30/2016 Luanne Bras, MD [facility]  . OVARIAN CYST SURGERY Right 1980's  . SALPINGOOPHORECTOMY Bilateral 02/06/2015   Procedure: Campbell Stall LAPAROTOMY/BILATERAL SALPINGO OOPHORECTOMY;  Surgeon: Everitt Amber, MD;  Location: [facility];  Service: Gynecology;  Laterality: Bilateral;   Social History   Occupational History  . Occupation: Pharmacist, hospital  Tobacco Use  . Smoking status: Current Every Day Smoker    Packs/day: 0.02    Types: Cigarettes  . Smokeless tobacco: Never Used  Vaping Use  . Vaping Use: Never used  Substance and Sexual Activity  . Alcohol use: No    Comment: Quit 10/2016  . Drug use: No  . Sexual activity: Never

## 2020-12-16 ENCOUNTER — Other Ambulatory Visit: Payer: Self-pay | Admitting: Orthopaedic Surgery

## 2020-12-31 ENCOUNTER — Ambulatory Visit: Payer: Medicaid Other | Admitting: Gastroenterology

## 2021-02-04 ENCOUNTER — Other Ambulatory Visit: Payer: Self-pay | Admitting: Orthopaedic Surgery

## 2021-02-04 ENCOUNTER — Telehealth: Payer: Self-pay

## 2021-02-04 NOTE — Telephone Encounter (Signed)
I called and informed pt. She stated understanding. I helped her make f/u for repeat inj

## 2021-02-04 NOTE — Telephone Encounter (Signed)
Patient called she stated she is starting a new job on Tuesday she is requesting a cortisone injection if possible patient has received injection 4/4 call [phone]

## 2021-02-04 NOTE — Telephone Encounter (Signed)
Unfortunately it is way too early for repeat injection.  She will need to wait at least another 5 to 6 weeks.

## 2021-02-04 NOTE — Telephone Encounter (Signed)
Please advise 

## 2021-02-18 ENCOUNTER — Other Ambulatory Visit: Payer: Self-pay | Admitting: Internal Medicine

## 2021-02-19 LAB — COMPLETE METABOLIC PANEL WITH GFR
AG Ratio: 1.3 (calc) (ref 1.0–2.5)
ALT: 8 U/L (ref 6–29)
AST: 14 U/L (ref 10–35)
Albumin: 4 g/dL (ref 3.6–5.1)
Alkaline phosphatase (APISO): 73 U/L (ref 37–153)
BUN/Creatinine Ratio: 18 (calc) (ref 6–22)
BUN: 18 mg/dL (ref 7–25)
CO2: 20 mmol/L (ref 20–32)
Calcium: 10.3 mg/dL (ref 8.6–10.4)
Chloride: 110 mmol/L (ref 98–110)
Creat: 1 mg/dL — ABNORMAL HIGH (ref 0.60–0.93)
GFR, Est African American: 65 mL/min/{1.73_m2} (ref >=60)
GFR, Est Non African American: 56 mL/min/{1.73_m2} — ABNORMAL LOW (ref >=60)
Globulin: 3.1 g/dL (calc) (ref 1.9–3.7)
Glucose, Bld: 115 mg/dL — ABNORMAL HIGH (ref 65–99)
Potassium: 4 mmol/L (ref 3.5–5.3)
Sodium: 141 mmol/L (ref 135–146)
Total Bilirubin: 0.2 mg/dL (ref 0.2–1.2)
Total Protein: 7.1 g/dL (ref 6.1–8.1)

## 2021-02-19 LAB — CBC
HCT: 38.9 % (ref 35.0–45.0)
Hemoglobin: 12.7 g/dL (ref 11.7–15.5)
MCH: 32 pg (ref 27.0–33.0)
MCHC: 32.6 g/dL (ref 32.0–36.0)
MCV: 98 fL (ref 80.0–100.0)
MPV: 11.8 fL (ref 7.5–12.5)
Platelets: 172 Thousand/uL (ref 140–400)
RBC: 3.97 Million/uL (ref 3.80–5.10)
RDW: 12.4 % (ref 11.0–15.0)
WBC: 7.9 Thousand/uL (ref 3.8–10.8)

## 2021-02-19 LAB — TSH: TSH: 1.22 mIU/L (ref 0.40–4.50)

## 2021-02-19 LAB — LIPID PANEL
Cholesterol: 144 mg/dL (ref <200)
HDL: 48 mg/dL — ABNORMAL LOW (ref >=50)
LDL Cholesterol (Calc): 72 mg/dL (calc)
Non-HDL Cholesterol (Calc): 96 mg/dL (calc) (ref <130)
Total CHOL/HDL Ratio: 3 (calc) (ref <5.0)
Triglycerides: 166 mg/dL — ABNORMAL HIGH (ref <150)

## 2021-02-19 LAB — URIC ACID: Uric Acid, Serum: 5.3 mg/dL (ref 2.5–7.0)

## 2021-03-10 ENCOUNTER — Telehealth: Payer: Self-pay | Admitting: Internal Medicine

## 2021-03-10 NOTE — Telephone Encounter (Signed)
   Mercedes Gardner DOB: 03-01-1946 MRN: [id number]   RIDER WAIVER AND RELEASE OF LIABILITY  For purposes of improving physical access to our facilities, [facility] is pleased to partner with third parties to provide [facility] patients or other authorized individuals the option of convenient, on-demand ground transportation services (the [place]") through use of the technology service that enables users to request on-demand ground transportation from independent third-party providers.  By opting to use and accept these [employer], I, the undersigned, hereby agree on behalf of myself, and on behalf of any minor child using the Government social research officer for whom I am the parent or legal guardian, as follows:  Government social research officer provided to me are provided by independent third-party transportation providers who are not [employer] or employees and who are unaffiliated with [employer]. [facility] is neither a transportation carrier nor a common or public carrier. [facility] has no control over the quality or safety of the transportation that occurs as a result of the [employer]. [facility] cannot guarantee that any third-party transportation provider will complete any arranged transportation service. [facility] makes no representation, warranty, or guarantee regarding the reliability, timeliness, quality, safety, suitability, or availability of any of the Transport Services or that they will be error free. I fully understand that traveling by vehicle involves risks and dangers of serious bodily injury, including permanent disability, paralysis, and death. I agree, on behalf of myself and on behalf of any minor child using the Transport Services for whom I am the parent or legal guardian, that the entire risk arising out of my use of the [employer] remains solely with me, to the maximum extent permitted under applicable law. The [employer] are provided "as  is" and "as available." [facility] disclaims all representations and warranties, express, implied or statutory, not expressly set out in these terms, including the implied warranties of merchantability and fitness for a particular purpose. I hereby waive and release [facility], its agents, employees, officers, directors, representatives, insurers, attorneys, assigns, successors, subsidiaries, and affiliates from any and all past, present, or future claims, demands, liabilities, actions, causes of action, or suits of any kind directly or indirectly arising from acceptance and use of the [employer]. I further waive and release [facility] and its affiliates from all present and future liability and responsibility for any injury or death to persons or damages to property caused by or related to the use of the [employer]. I have read this Waiver and Release of Liability, and I understand the terms used in it and their legal significance. This Waiver is freely and voluntarily given with the understanding that my right (as well as the right of any minor child for whom I am the parent or legal guardian using the [employer]) to legal recourse against [place] in connection with the [employer] is knowingly surrendered in return for use of these services.   I attest that I read the consent document to Sander Nephew, gave Mr. Reader the opportunity to ask questions and answered the questions asked (if any). I affirm that Sander Nephew then provided consent for he's participation in this program.     Legrand Pitts

## 2021-03-12 ENCOUNTER — Ambulatory Visit (INDEPENDENT_AMBULATORY_CARE_PROVIDER_SITE_OTHER): Payer: Medicare (Managed Care) | Admitting: Orthopaedic Surgery

## 2021-03-12 ENCOUNTER — Encounter: Payer: Self-pay | Admitting: Orthopaedic Surgery

## 2021-03-12 ENCOUNTER — Other Ambulatory Visit: Payer: Self-pay

## 2021-03-12 DIAGNOSIS — G8929 Other chronic pain: Secondary | ICD-10-CM

## 2021-03-12 DIAGNOSIS — M25562 Pain in left knee: Secondary | ICD-10-CM

## 2021-03-12 DIAGNOSIS — M25561 Pain in right knee: Secondary | ICD-10-CM

## 2021-03-12 MED ORDER — LIDOCAINE HCL 1 % IJ SOLN
3.0000 mL | INTRAMUSCULAR | Status: AC | PRN
  Administered 2021-03-12: 3 mL

## 2021-03-12 MED ORDER — ACETAMINOPHEN-CODEINE #3 300-30 MG PO TABS: 1.0000 | ORAL_TABLET | Freq: Three times a day (TID) | ORAL | 0 refills | Status: DC | PRN

## 2021-03-12 MED ORDER — METHYLPREDNISOLONE ACETATE 40 MG/ML IJ SUSP
40.0000 mg | INTRAMUSCULAR | Status: AC | PRN
  Administered 2021-03-12: 40 mg via INTRA_ARTICULAR

## 2021-03-12 NOTE — Progress Notes (Signed)
Office Visit Note   Patient: Mercedes Gardner           Date of Birth: [birth year]           MRN: [id number] Visit Date: 03/12/2021              Requested by: Nolene Ebbs, MD [address] [place],  [place] [address] PCP: Nolene Ebbs, MD   Assessment & Plan: Visit Diagnoses:  1. Chronic pain of left knee   2. Chronic pain of right knee     Plan: Per the patient's request I did provide a steroid injection of both knees which she tolerated well.  She needs to really wait between 3 and 4 months for repeat steroid injections.  I will send in some Tylenol 3 that she should use sparingly but we cannot keep refilling this over and over.  Follow-Up Instructions: Return if symptoms worsen or fail to improve.   Orders:  Orders Placed This Encounter  Procedures   Large Joint Inj   Large Joint Inj   Meds ordered this encounter  Medications   acetaminophen-codeine (TYLENOL #3) 300-30 MG tablet    Sig: Take 1 tablet by mouth every 8 (eight) hours as needed.    Dispense:  30 tablet    Refill:  0      Procedures: Large Joint Inj: R knee on 03/12/2021 4:26 PM Indications: diagnostic evaluation and pain Details: 22 G 1.5 in needle, superolateral approach  Arthrogram: No  Medications: 3 mL lidocaine 1 %; 40 mg methylPREDNISolone acetate 40 MG/ML Outcome: tolerated well, no immediate complications Procedure, treatment alternatives, risks and benefits explained, specific risks discussed. Consent was given by the patient. Immediately prior to procedure a time out was called to verify the correct patient, procedure, equipment, support staff and site/side marked as required. Patient was prepped and draped in the usual sterile fashion.    Large Joint Inj: L knee on 03/12/2021 4:26 PM Indications: diagnostic evaluation and pain Details: 22 G 1.5 in needle, superolateral approach  Arthrogram: No  Medications: 3 mL lidocaine 1 %; 40 mg methylPREDNISolone acetate 40 MG/ML Outcome:  tolerated well, no immediate complications Procedure, treatment alternatives, risks and benefits explained, specific risks discussed. Consent was given by the patient. Immediately prior to procedure a time out was called to verify the correct patient, procedure, equipment, support staff and site/side marked as required. Patient was prepped and draped in the usual sterile fashion.      Clinical Data: No additional findings.   Subjective: Chief Complaint  Patient presents with   Left Knee - Follow-up   Right Knee - Follow-up  The patient comes in today requesting steroid injections of both knees.  She has known well-documented end-stage arthritis of both knees and comes in about every 3 months for steroid injections.  She is also requesting Tylenol 3.  I told her we need to limit that because the solution is not long-term narcotics for arthritis in her knees.  She said she would use these sparingly.  She has had no other acute change in her medical status.  She is 75 years old.  HPI  Review of Systems There is currently listed no headache, chest pain, shortness of breath, fever, chills, nausea, vomiting  Objective: Vital Signs: There were no vitals taken for this visit.  Physical Exam She is alert and orient x3 and in no acute distress Ortho Exam Examination of both knees shows varus malalignment that is significant.  There is no  knee joint effusion but pain throughout the arc of motion of both knees. Specialty Comments:  No specialty comments available.  Imaging: No results found.   PMFS History: Patient Active Problem List   Diagnosis Date Noted   Unilateral primary osteoarthritis, right knee 03/05/2019   Tuberculosis screening 06/08/2017   Generalized anxiety disorder 03/24/2017   Eczema of both upper extremities 03/24/2017   Ventral hernia without obstruction or gangrene 03/23/2017   Healthcare maintenance 12/07/2016   Incidental lung nodule, greater than or equal to  66mm    History of stroke    Normocytic anemia 10/11/2016   Personal history of gout 12/24/2008   ALLERGIC RHINITIS 12/20/2008   Unilateral primary osteoarthritis, left knee 12/20/2008   TOBACCO ABUSE 07/29/2006   Essential hypertension 07/29/2006   Past Medical History:  Diagnosis Date   Anemia    Anxiety    Arthritis    knees    Chronic vulvovaginitis    Degenerative joint disease (DJD) of lumbar spine    Dysrhythmia    heart skips a beat    Eczema    GERD (gastroesophageal reflux disease)    Gout    left knee   Hyperlipidemia April 2014   Hypertension    Osteoarthritis of left knee    Ovarian cyst    removed during mini lap 2016, benign path, Dr Denman George   Shortness of breath dyspnea    due to pressure of cyst per patient    Stroke [facility]) 10/2016   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)    TIA (transient ischemic attack)    Tinea corporis 2017    Family History  Problem Relation Age of Onset   Hypertension Mother    Diabetes Mother    Breast cancer Mother    Diabetes Sister    Hypertension Sister    Other Father        accident    Past Surgical History:  Procedure Laterality Date   CESAREAN SECTION     x3   IR GENERIC HISTORICAL  11/30/2016   IR ANGIO VERTEBRAL SEL VERTEBRAL UNI L MOD SED 11/30/2016 Luanne Bras, MD [facility]   IR GENERIC HISTORICAL  11/30/2016   IR ANGIO INTRA EXTRACRAN SEL COM CAROTID INNOMINATE BILAT MOD SED 11/30/2016 Luanne Bras, MD [facility]   IR GENERIC HISTORICAL  11/30/2016   IR ANGIO VERTEBRAL SEL SUBCLAVIAN INNOMINATE UNI R MOD SED 11/30/2016 Luanne Bras, MD [facility]   OVARIAN CYST SURGERY Right 1980's   SALPINGOOPHORECTOMY Bilateral 02/06/2015   Procedure: Campbell Stall LAPAROTOMY/BILATERAL SALPINGO OOPHORECTOMY;  Surgeon: Everitt Amber, MD;  Location: [facility];  Service: Gynecology;  Laterality: Bilateral;   Social History   Occupational History   Occupation: Pharmacist, hospital  Tobacco Use   Smoking status:  Every Day    Packs/day: 0.02    Pack years: 0.00    Types: Cigarettes   Smokeless tobacco: Never  Vaping Use   Vaping Use: Never used  Substance and Sexual Activity   Alcohol use: No    Comment: Quit 10/2016   Drug use: No   Sexual activity: Never

## 2021-04-30 ENCOUNTER — Telehealth: Payer: Self-pay

## 2021-04-30 NOTE — Telephone Encounter (Signed)
Pt called in stating that she is in a extreme amount of pain she would like to know if she can have anything for pain till she has surgery.  She started a new job and is on her feet and its really bothering her this week

## 2021-04-30 NOTE — Telephone Encounter (Signed)
Please advise 

## 2021-05-01 ENCOUNTER — Other Ambulatory Visit: Payer: Self-pay | Admitting: Orthopaedic Surgery

## 2021-05-01 MED ORDER — ACETAMINOPHEN-CODEINE #3 300-30 MG PO TABS: 1.0000 | ORAL_TABLET | Freq: Three times a day (TID) | ORAL | 0 refills | Status: DC | PRN

## 2021-06-17 ENCOUNTER — Ambulatory Visit: Payer: Medicaid Other | Admitting: Physician Assistant

## 2021-06-19 ENCOUNTER — Telehealth: Payer: Self-pay | Admitting: Physician Assistant

## 2021-06-19 ENCOUNTER — Other Ambulatory Visit: Payer: Self-pay | Admitting: Orthopaedic Surgery

## 2021-06-19 MED ORDER — TRAMADOL HCL 50 MG PO TABS: 50.0000 mg | ORAL_TABLET | Freq: Four times a day (QID) | ORAL | 0 refills | Status: DC | PRN

## 2021-06-19 NOTE — Telephone Encounter (Signed)
Patient called. She would like to know if she can have something for pain. Her call back number is [phone]

## 2021-07-06 ENCOUNTER — Ambulatory Visit: Payer: Medicaid Other | Admitting: Physician Assistant

## 2021-07-14 ENCOUNTER — Telehealth: Payer: Self-pay | Admitting: Orthopaedic Surgery

## 2021-07-14 NOTE — Telephone Encounter (Signed)
Pt called and can't get off work and she is off tomorrow. She would like to know if she could get bil knee injections tomorrow?   CB [phone]

## 2021-07-15 ENCOUNTER — Encounter: Payer: Self-pay | Admitting: Orthopaedic Surgery

## 2021-07-15 ENCOUNTER — Ambulatory Visit (INDEPENDENT_AMBULATORY_CARE_PROVIDER_SITE_OTHER): Payer: Medicare (Managed Care) | Admitting: Orthopaedic Surgery

## 2021-07-15 DIAGNOSIS — M1711 Unilateral primary osteoarthritis, right knee: Secondary | ICD-10-CM

## 2021-07-15 DIAGNOSIS — M1712 Unilateral primary osteoarthritis, left knee: Secondary | ICD-10-CM | POA: Diagnosis not present

## 2021-07-15 DIAGNOSIS — M25562 Pain in left knee: Secondary | ICD-10-CM | POA: Diagnosis not present

## 2021-07-15 DIAGNOSIS — M170 Bilateral primary osteoarthritis of knee: Secondary | ICD-10-CM

## 2021-07-15 DIAGNOSIS — M25561 Pain in right knee: Secondary | ICD-10-CM

## 2021-07-15 MED ORDER — ACETAMINOPHEN-CODEINE #3 300-30 MG PO TABS: 1.0000 | ORAL_TABLET | Freq: Three times a day (TID) | ORAL | 0 refills | Status: DC | PRN

## 2021-07-15 MED ORDER — LIDOCAINE HCL 1 % IJ SOLN
3.0000 mL | INTRAMUSCULAR | Status: AC | PRN
  Administered 2021-07-15: 3 mL

## 2021-07-15 MED ORDER — METHYLPREDNISOLONE ACETATE 40 MG/ML IJ SUSP
40.0000 mg | INTRAMUSCULAR | Status: AC | PRN
  Administered 2021-07-15: 40 mg via INTRA_ARTICULAR

## 2021-07-15 MED ORDER — METHYLPREDNISOLONE ACETATE 40 MG/ML IJ SUSP
40.0000 mg | INTRAMUSCULAR | Status: AC | PRN
Start: 2021-07-15 — End: 2021-07-15
  Administered 2021-07-15: 40 mg via INTRA_ARTICULAR

## 2021-07-15 NOTE — Progress Notes (Signed)
   Procedure Note  Patient: Mercedes Gardner             Date of Birth: April 09, 1946           MRN: [id number]             Visit Date: 07/15/2021  HPI: Mrs. Preece is well-known to our department service comes in today requesting repeat injections both knees last seen on 03/12/2021 and giving cortisone injections which lasted until just recently.  She denies any new injury to either knee.  She has known osteoarthritis both knees. Review of systems: Denies any fevers chills or recent vaccines.  Bilateral knees: Good range of motion both knees.  Patellofemoral crepitus both knees passive range of motion.  No abnormal warmth erythema of either knee. Procedures: Visit Diagnoses:  1. Unilateral primary osteoarthritis, left knee   2. Unilateral primary osteoarthritis, right knee     Large Joint Inj: bilateral knee on 07/15/2021 9:42 AM Indications: pain Details: 22 G 1.5 in needle, anterolateral approach  Arthrogram: No  Medications (Right): 3 mL lidocaine 1 %; 40 mg methylPREDNISolone acetate 40 MG/ML Medications (Left): 3 mL lidocaine 1 %; 40 mg methylPREDNISolone acetate 40 MG/ML Outcome: tolerated well, no immediate complications Procedure, treatment alternatives, risks and benefits explained, specific risks discussed. Consent was given by the patient. Immediately prior to procedure a time out was called to verify the correct patient, procedure, equipment, support staff and site/side marked as required. Patient was prepped and draped in the usual sterile fashion.    Plan: She will work on Forensic scientist.  She did ask for pain medicine she states she does not tolerate tramadol well.  She is given Tylenol 3 which she is to use sparingly.  She continues to have need for pain medications long-term we may need to refer her to a pain clinic.  Questions were encouraged and answered at length

## 2021-11-03 ENCOUNTER — Other Ambulatory Visit: Payer: Self-pay | Admitting: Orthopaedic Surgery

## 2021-11-03 ENCOUNTER — Telehealth: Payer: Self-pay | Admitting: Orthopaedic Surgery

## 2021-11-03 MED ORDER — ACETAMINOPHEN-CODEINE #3 300-30 MG PO TABS: 1.0000 | ORAL_TABLET | Freq: Three times a day (TID) | ORAL | 0 refills | Status: DC | PRN

## 2021-11-03 NOTE — Telephone Encounter (Signed)
Please advise 

## 2021-11-03 NOTE — Telephone Encounter (Signed)
Pt called asking for pain medication. Pt states she start work sooner and still haven't seen pain management. Please call pt about this matter at [phone].

## 2021-11-11 ENCOUNTER — Ambulatory Visit (INDEPENDENT_AMBULATORY_CARE_PROVIDER_SITE_OTHER): Payer: Medicare (Managed Care) | Admitting: Orthopaedic Surgery

## 2021-11-11 ENCOUNTER — Encounter: Payer: Self-pay | Admitting: Orthopaedic Surgery

## 2021-11-11 DIAGNOSIS — M25561 Pain in right knee: Secondary | ICD-10-CM | POA: Diagnosis not present

## 2021-11-11 DIAGNOSIS — M25562 Pain in left knee: Secondary | ICD-10-CM

## 2021-11-11 DIAGNOSIS — G8929 Other chronic pain: Secondary | ICD-10-CM

## 2021-11-11 MED ORDER — METHYLPREDNISOLONE ACETATE 40 MG/ML IJ SUSP
40.0000 mg | INTRAMUSCULAR | Status: AC | PRN
  Administered 2021-11-11: 40 mg via INTRA_ARTICULAR

## 2021-11-11 MED ORDER — LIDOCAINE HCL 1 % IJ SOLN
3.0000 mL | INTRAMUSCULAR | Status: AC | PRN
  Administered 2021-11-11: 3 mL

## 2021-11-11 NOTE — Progress Notes (Signed)
? ?Office Visit Note ?  ?Patient: Mercedes Gardner           ?Date of Birth: September 26, 1945           ?MRN: [id number] ?Visit Date: 11/11/2021 ?             ?Requested by: Nolene Ebbs, MD ?[address] ?[place],  [place] [address] ?PCP: Nolene Ebbs, MD ? ? ?Assessment & Plan: ?Visit Diagnoses:  ?1. Chronic pain of left knee   ?2. Chronic pain of right knee   ? ? ?Plan:   The patient fully understands the risk and benefits of steroid injections.  These have helped her in the past.  She did tolerate steroids in both knees today.  She knows to wait at least 3 months between injections. ? ?Follow-Up Instructions: Return if symptoms worsen or fail to improve.  ? ?Orders:  ?Orders Placed This Encounter  ?Procedures  ? Large Joint Inj  ? Large Joint Inj  ? ?No orders of the defined types were placed in this encounter. ? ? ? ? Procedures: ?Large Joint Inj: R knee on 11/11/2021 9:02 AM ?Indications: diagnostic evaluation and pain ?Details: 22 G 1.5 in needle, superolateral approach ? ?Arthrogram: No ? ?Medications: 3 mL lidocaine 1 %; 40 mg methylPREDNISolone acetate 40 MG/ML ?Outcome: tolerated well, no immediate complications ?Procedure, treatment alternatives, risks and benefits explained, specific risks discussed. Consent was given by the patient. Immediately prior to procedure a time out was called to verify the correct patient, procedure, equipment, support staff and site/side marked as required. Patient was prepped and draped in the usual sterile fashion.  ? ? ?Large Joint Inj: L knee on 11/11/2021 9:03 AM ?Indications: diagnostic evaluation and pain ?Details: 22 G 1.5 in needle, superolateral approach ? ?Arthrogram: No ? ?Medications: 3 mL lidocaine 1 %; 40 mg methylPREDNISolone acetate 40 MG/ML ?Outcome: tolerated well, no immediate complications ?Procedure, treatment alternatives, risks and benefits explained, specific risks discussed. Consent was given by the patient. Immediately prior to procedure a time out was  called to verify the correct patient, procedure, equipment, support staff and site/side marked as required. Patient was prepped and draped in the usual sterile fashion.  ? ? ? ? ?Clinical Data: ?No additional findings. ? ? ?Subjective: ?Chief Complaint  ?Patient presents with  ? Right Knee - Pain, Follow-up  ? Left Knee - Pain, Follow-up  ?The patient is well-known to me.  She is a very thin cachectic 76 year old female with bilateral knee end-stage arthritis.  She is not interested in any type of surgery.  She comes in about every 3 to 4 months for steroid injections in both knees.  She says that is helped her the most.  She has had no acute change in her medical status and would like to have steroid injections in both knees today.  She is not a diabetic. ? ?HPI ? ?Review of Systems ?There is no listed fever, chills, nausea, vomiting ? ?Objective: ?Vital Signs: There were no vitals taken for this visit. ? ?Physical Exam ?She is alert and orient x3 and in no acute distress ?Ortho Exam ?Examination of both knees shows significant varus malalignment but no effusion and good range of motion.  Both knees are globally tender with patellofemoral crepitation. ?Specialty Comments:  ?No specialty comments available. ? ?Imaging: ?No results found. ? ? ?PMFS History: ?Patient Active Problem List  ? Diagnosis Date Noted  ? Unilateral primary osteoarthritis, right knee 03/05/2019  ? Tuberculosis screening 06/08/2017  ? Generalized  anxiety disorder 03/24/2017  ? Eczema of both upper extremities 03/24/2017  ? Ventral hernia without obstruction or gangrene 03/23/2017  ? Healthcare maintenance 12/07/2016  ? Incidental lung nodule, greater than or equal to 12mm   ? History of stroke   ? Normocytic anemia 10/11/2016  ? Personal history of gout 12/24/2008  ? ALLERGIC RHINITIS 12/20/2008  ? Unilateral primary osteoarthritis, left knee 12/20/2008  ? TOBACCO ABUSE 07/29/2006  ? Essential hypertension 07/29/2006  ? ?Past Medical History:   ?Diagnosis Date  ? Anemia   ? Anxiety   ? Arthritis   ? knees   ? Chronic vulvovaginitis   ? Degenerative joint disease (DJD) of lumbar spine   ? Dysrhythmia   ? heart skips a beat   ? Eczema   ? GERD (gastroesophageal reflux disease)   ? Gout   ? left knee  ? Hyperlipidemia April 2014  ? Hypertension   ? Osteoarthritis of left knee   ? Ovarian cyst   ? removed during mini lap 2016, benign path, Dr Denman George  ? Shortness of breath dyspnea   ? due to pressure of cyst per patient   ? Stroke [facility]) 10/2016  ? 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 TIA (transient ischemic attack)   ? Tinea corporis 2017  ?  ?Family History  ?Problem Relation Age of Onset  ? Hypertension Mother   ? Diabetes Mother   ? Breast cancer Mother   ? Diabetes Sister   ? Hypertension Sister   ? Other Father   ?     accident  ?  ?Past Surgical History:  ?Procedure Laterality Date  ? CESAREAN SECTION    ? x3  ? IR GENERIC HISTORICAL  11/30/2016  ? IR ANGIO VERTEBRAL SEL VERTEBRAL UNI L MOD SED 11/30/2016 Luanne Bras, MD [facility]  ? IR GENERIC HISTORICAL  11/30/2016  ? IR ANGIO INTRA EXTRACRAN SEL COM CAROTID INNOMINATE BILAT MOD SED 11/30/2016 Luanne Bras, MD [facility]  ? IR GENERIC HISTORICAL  11/30/2016  ? IR ANGIO VERTEBRAL SEL SUBCLAVIAN INNOMINATE UNI R MOD SED 11/30/2016 Luanne Bras, MD [facility]  ? OVARIAN CYST SURGERY Right 1980's  ? SALPINGOOPHORECTOMY Bilateral 02/06/2015  ? Procedure: [id number] LAPAROTOMY/BILATERAL SALPINGO OOPHORECTOMY;  Surgeon: Everitt Amber, MD;  Location: [facility];  Service: Gynecology;  Laterality: Bilateral;  ? ?Social History  ? ?Occupational History  ? Occupation: Pharmacist, hospital  ?Tobacco Use  ? Smoking status: Every Day  ?  Packs/day: 0.02  ?  Types: Cigarettes  ? Smokeless tobacco: Never  ?Vaping Use  ? Vaping Use: Never used  ?Substance and Sexual Activity  ? Alcohol use: No  ?  Comment: Quit 10/2016  ? Drug use: No  ? Sexual activity: Never  ? ? ? ? ? ? ?

## 2022-02-03 ENCOUNTER — Other Ambulatory Visit: Payer: Self-pay | Admitting: Orthopaedic Surgery

## 2022-03-08 ENCOUNTER — Ambulatory Visit: Payer: Medicaid Other | Admitting: Physician Assistant

## 2022-03-17 ENCOUNTER — Encounter: Payer: Self-pay | Admitting: Physician Assistant

## 2022-03-17 ENCOUNTER — Ambulatory Visit (INDEPENDENT_AMBULATORY_CARE_PROVIDER_SITE_OTHER): Payer: Medicare (Managed Care) | Admitting: Physician Assistant

## 2022-03-17 DIAGNOSIS — M1712 Unilateral primary osteoarthritis, left knee: Secondary | ICD-10-CM

## 2022-03-17 DIAGNOSIS — M1711 Unilateral primary osteoarthritis, right knee: Secondary | ICD-10-CM | POA: Diagnosis not present

## 2022-03-17 DIAGNOSIS — M170 Bilateral primary osteoarthritis of knee: Secondary | ICD-10-CM

## 2022-03-17 NOTE — Progress Notes (Unsigned)
   Procedure Note  Patient: Mercedes Gardner             Date of Birth: [birth year]           MRN: [id number]             Visit Date: 03/17/2022 HPI: Mercedes Gardner is well-known to Dr. Delilah Shan service comes in today with bilateral knee pain.  She has known end-stage arthritis both knees.  She is requesting cortisone injections both knees.  Last injections were performed on 11/11/2021 and gave her good relief until recently.  She has had no new injury to either knee.  Patient is nondiabetic.  Review of systems: Negative for fevers or chills  Physical exam: General well-developed well-nourished female no acute distress. Bilateral knees: Good range of motion both knees.  Patellofemoral crepitus bilaterally.  No abnormal warmth erythema or effusion of either knee. Procedures: Visit Diagnoses:  1. Unilateral primary osteoarthritis, right knee   2. Unilateral primary osteoarthritis, left knee     Large Joint Inj: bilateral knee on 03/17/2022 10:39 AM Indications: pain Details: 22 G 1.5 in needle, anterolateral approach  Arthrogram: No  Medications (Right): 3 mL lidocaine 1 %; 40 mg methylPREDNISolone acetate 40 MG/ML Medications (Left): 3 mL lidocaine 1 %; 40 mg methylPREDNISolone acetate 40 MG/ML Outcome: tolerated well, no immediate complications Procedure, treatment alternatives, risks and benefits explained, specific risks discussed. Consent was given by the patient. Immediately prior to procedure a time out was called to verify the correct patient, procedure, equipment, support staff and site/side marked as required. Patient was prepped and draped in the usual sterile fashion.    Plan: Discussed quad strengthening exercises with her.  She did ask about a knee brace which was provided for her today.  Regards to the cortisone injection she knows to wait at least 3 months between injections.

## 2022-03-18 MED ORDER — METHYLPREDNISOLONE ACETATE 40 MG/ML IJ SUSP
40.0000 mg | INTRAMUSCULAR | Status: AC | PRN
  Administered 2022-03-17: 40 mg via INTRA_ARTICULAR

## 2022-03-18 MED ORDER — LIDOCAINE HCL 1 % IJ SOLN
3.0000 mL | INTRAMUSCULAR | Status: AC | PRN
  Administered 2022-03-17: 3 mL

## 2022-03-23 ENCOUNTER — Other Ambulatory Visit: Payer: Self-pay | Admitting: Orthopaedic Surgery

## 2022-04-14 ENCOUNTER — Other Ambulatory Visit (HOSPITAL_COMMUNITY): Payer: Self-pay | Admitting: Internal Medicine

## 2022-04-14 DIAGNOSIS — R011 Cardiac murmur, unspecified: Secondary | ICD-10-CM

## 2022-04-30 ENCOUNTER — Ambulatory Visit (HOSPITAL_COMMUNITY): Payer: Medicare (Managed Care)

## 2022-05-11 ENCOUNTER — Ambulatory Visit (HOSPITAL_COMMUNITY): Admission: RE | Admit: 2022-05-11 | Payer: Medicare (Managed Care) | Source: Ambulatory Visit

## 2022-05-11 ENCOUNTER — Encounter (HOSPITAL_COMMUNITY): Payer: Self-pay

## 2022-06-25 ENCOUNTER — Other Ambulatory Visit: Payer: Self-pay | Admitting: Orthopaedic Surgery

## 2022-06-25 ENCOUNTER — Telehealth: Payer: Self-pay | Admitting: Physician Assistant

## 2022-06-25 MED ORDER — ACETAMINOPHEN-CODEINE 300-30 MG PO TABS: 1.0000 | ORAL_TABLET | Freq: Three times a day (TID) | ORAL | 0 refills | Status: DC | PRN

## 2022-06-25 NOTE — Telephone Encounter (Signed)
Pt called requesting refill of tylenol 3. Please send to pharmacy on file. Pt phone number is [phone].

## 2022-07-05 ENCOUNTER — Encounter: Payer: Self-pay | Admitting: Physician Assistant

## 2022-07-05 ENCOUNTER — Ambulatory Visit (INDEPENDENT_AMBULATORY_CARE_PROVIDER_SITE_OTHER): Payer: Medicare (Managed Care) | Admitting: Physician Assistant

## 2022-07-05 DIAGNOSIS — M1711 Unilateral primary osteoarthritis, right knee: Secondary | ICD-10-CM

## 2022-07-05 DIAGNOSIS — M170 Bilateral primary osteoarthritis of knee: Secondary | ICD-10-CM | POA: Diagnosis not present

## 2022-07-05 DIAGNOSIS — M1712 Unilateral primary osteoarthritis, left knee: Secondary | ICD-10-CM | POA: Diagnosis not present

## 2022-07-05 MED ORDER — LIDOCAINE HCL 1 % IJ SOLN
3.0000 mL | INTRAMUSCULAR | Status: AC | PRN
  Administered 2022-07-05: 3 mL

## 2022-07-05 MED ORDER — METHYLPREDNISOLONE ACETATE 40 MG/ML IJ SUSP
40.0000 mg | INTRAMUSCULAR | Status: AC | PRN
  Administered 2022-07-05: 40 mg via INTRA_ARTICULAR

## 2022-07-05 NOTE — Progress Notes (Signed)
   Procedure Note  Patient: Mercedes Gardner             Date of Birth: [birth year]           MRN: [id number]             Visit Date: 07/05/2022 HPI: Mercedes Gardner comes in today requesting bilateral knee injections.  And states the last injections on 03/17/2022 gave her good results until about the last 2 weeks.  She has had no new injury to either knee.  Denies any fevers chills.  She is nondiabetic.  Tricompartmental arthritis both knees  Review of systems see HPI otherwise negative  Physical exam: General well-developed well-nourished female no acute distress.  Bilateral knees good range of motion both knees.  No abnormal warmth erythema or effusion of either knee.  Patellofemoral crepitus both knees with passive range of motion.  Bilateral knees with varus deformities.  Procedures: Visit Diagnoses:  1. Unilateral primary osteoarthritis, right knee   2. Unilateral primary osteoarthritis, left knee     Large Joint Inj: bilateral knee on 07/05/2022 9:59 AM Indications: pain Details: 22 G 1.5 in needle, anterolateral approach  Arthrogram: No  Medications (Right): 3 mL lidocaine 1 %; 40 mg methylPREDNISolone acetate 40 MG/ML Medications (Left): 3 mL lidocaine 1 %; 40 mg methylPREDNISolone acetate 40 MG/ML Outcome: tolerated well, no immediate complications Procedure, treatment alternatives, risks and benefits explained, specific risks discussed. Consent was given by the patient. Immediately prior to procedure a time out was called to verify the correct patient, procedure, equipment, support staff and site/side marked as required. Patient was prepped and draped in the usual sterile fashion.     Plan: Discussed with her working on quad strengthening.  She will follow-up with [place] as needed.  Questions encouraged and answered she knows to wait least 3 months between injections

## 2022-07-13 ENCOUNTER — Other Ambulatory Visit: Payer: Self-pay | Admitting: Internal Medicine

## 2022-07-14 ENCOUNTER — Ambulatory Visit: Payer: Medicaid Other | Admitting: Internal Medicine

## 2022-07-16 LAB — URINE CULTURE
MICRO NUMBER:: 14124413
SPECIMEN QUALITY:: ADEQUATE

## 2022-09-03 ENCOUNTER — Emergency Department (HOSPITAL_COMMUNITY): Payer: Medicare (Managed Care)

## 2022-09-03 ENCOUNTER — Emergency Department (HOSPITAL_COMMUNITY)
Admission: EM | Admit: 2022-09-03 | Discharge: 2022-09-03 | Disposition: A | Discharge status: Home / Self Care | Payer: Medicare (Managed Care) | Attending: Emergency Medicine | Admitting: Emergency Medicine

## 2022-09-03 ENCOUNTER — Encounter (HOSPITAL_COMMUNITY): Payer: Self-pay | Admitting: *Deleted

## 2022-09-03 ENCOUNTER — Other Ambulatory Visit: Payer: Self-pay

## 2022-09-03 DIAGNOSIS — Z7902 Long term (current) use of antithrombotics/antiplatelets: Secondary | ICD-10-CM | POA: Insufficient documentation

## 2022-09-03 DIAGNOSIS — R4182 Altered mental status, unspecified: Secondary | ICD-10-CM | POA: Insufficient documentation

## 2022-09-03 DIAGNOSIS — R4781 Slurred speech: Secondary | ICD-10-CM | POA: Insufficient documentation

## 2022-09-03 DIAGNOSIS — U071 COVID-19: Secondary | ICD-10-CM | POA: Diagnosis not present

## 2022-09-03 DIAGNOSIS — Z7982 Long term (current) use of aspirin: Secondary | ICD-10-CM | POA: Diagnosis not present

## 2022-09-03 DIAGNOSIS — Z79899 Other long term (current) drug therapy: Secondary | ICD-10-CM | POA: Insufficient documentation

## 2022-09-03 DIAGNOSIS — I10 Essential (primary) hypertension: Secondary | ICD-10-CM | POA: Insufficient documentation

## 2022-09-03 DIAGNOSIS — R519 Headache, unspecified: Secondary | ICD-10-CM | POA: Diagnosis present

## 2022-09-03 LAB — CBC WITH DIFFERENTIAL/PLATELET
Abs Immature Granulocytes: 0.01 10*3/uL (ref 0.00–0.07)
Basophils Absolute: 0 10*3/uL (ref 0.0–0.1)
Basophils Relative: 1 %
Eosinophils Absolute: 0.1 10*3/uL (ref 0.0–0.5)
Eosinophils Relative: 1 %
HCT: 37.2 % (ref 36.0–46.0)
Hemoglobin: 11.6 g/dL — ABNORMAL LOW (ref 12.0–15.0)
Immature Granulocytes: 0 %
Lymphocytes Relative: 16 %
Lymphs Abs: 0.8 10*3/uL (ref 0.7–4.0)
MCH: 31.6 pg (ref 26.0–34.0)
MCHC: 31.2 g/dL (ref 30.0–36.0)
MCV: 101.4 fL — ABNORMAL HIGH (ref 80.0–100.0)
Monocytes Absolute: 0.6 10*3/uL (ref 0.1–1.0)
Monocytes Relative: 12 %
Neutro Abs: 3.6 10*3/uL (ref 1.7–7.7)
Neutrophils Relative %: 70 %
Platelets: 126 10*3/uL — ABNORMAL LOW (ref 150–400)
RBC: 3.67 MIL/uL — ABNORMAL LOW (ref 3.87–5.11)
RDW: 13.6 % (ref 11.5–15.5)
WBC: 5.1 10*3/uL (ref 4.0–10.5)
nRBC: 0 % (ref 0.0–0.2)

## 2022-09-03 LAB — COMPREHENSIVE METABOLIC PANEL
ALT: 11 U/L (ref 0–44)
AST: 17 U/L (ref 15–41)
Albumin: 3.3 g/dL — ABNORMAL LOW (ref 3.5–5.0)
Alkaline Phosphatase: 70 U/L (ref 38–126)
Anion gap: 9 (ref 5–15)
BUN: 10 mg/dL (ref 8–23)
CO2: 20 mmol/L — ABNORMAL LOW (ref 22–32)
Calcium: 9.1 mg/dL (ref 8.9–10.3)
Chloride: 110 mmol/L (ref 98–111)
Creatinine, Ser: 0.73 mg/dL (ref 0.44–1.00)
GFR, Estimated: >60 mL/min (ref >60)
Glucose, Bld: 89 mg/dL (ref 70–99)
Potassium: 3.7 mmol/L (ref 3.5–5.1)
Sodium: 139 mmol/L (ref 135–145)
Total Bilirubin: 0.5 mg/dL (ref 0.3–1.2)
Total Protein: 6.8 g/dL (ref 6.5–8.1)

## 2022-09-03 LAB — URINALYSIS, ROUTINE W REFLEX MICROSCOPIC
Bilirubin Urine: NEGATIVE
Glucose, UA: NEGATIVE mg/dL
Ketones, ur: NEGATIVE mg/dL
Leukocytes,Ua: NEGATIVE
Nitrite: NEGATIVE
Protein, ur: NEGATIVE mg/dL
Specific Gravity, Urine: 1.006 (ref 1.005–1.030)
pH: 6 (ref 5.0–8.0)

## 2022-09-03 LAB — RAPID URINE DRUG SCREEN, HOSP PERFORMED
Amphetamines: NOT DETECTED
Barbiturates: NOT DETECTED
Benzodiazepines: NOT DETECTED
Cocaine: NOT DETECTED
Opiates: NOT DETECTED
Tetrahydrocannabinol: NOT DETECTED

## 2022-09-03 LAB — RESP PANEL BY RT-PCR (RSV, FLU A&B, COVID)  RVPGX2
Influenza A by PCR: NEGATIVE
Influenza B by PCR: NEGATIVE
Resp Syncytial Virus by PCR: NEGATIVE
SARS Coronavirus 2 by RT PCR: POSITIVE — AB

## 2022-09-03 LAB — ACETAMINOPHEN LEVEL: Acetaminophen (Tylenol), Serum: <10 ug/mL — ABNORMAL LOW (ref 10–30)

## 2022-09-03 LAB — AMMONIA: Ammonia: 30 umol/L (ref 9–35)

## 2022-09-03 LAB — ETHANOL: Alcohol, Ethyl (B): <10 mg/dL (ref <10)

## 2022-09-03 LAB — SALICYLATE LEVEL: Salicylate Lvl: <7 mg/dL — ABNORMAL LOW (ref 7.0–30.0)

## 2022-09-03 NOTE — Discharge Instructions (Addendum)
As we discussed, I would like for you to quarantine 5 days from the time you started having symptoms.  Wear mask around family members as well as the holidays.  Please wash your hands.  Drink plenty of fluids and get plenty of rest.  Please follow-up with your primary care doctor for further evaluation.  You may return to the emergency room for any worsening symptoms at any time.

## 2022-09-03 NOTE — ED Provider Notes (Signed)
Accepted handoff at shift change from [employer]. Please see prior provider note for more detail.   Briefly: Patient is 76 y.o. patient who presents to the emergency room today for evaluation of slurred speech.  Patient has been having some headaches and overall feeling poorly for the last couple of days.  DDX: concern for stroke, acute illness, complex migraine, dehydration, electrolyte abnormalities  Plan: Discharge home.  MRI does not show any signs of acute stroke.  I touch base with neurology who is in agreement.  Patient did test positive for COVID which could explain some of her symptoms.  She will quarantine for the next 5 days.  I will have her follow-up with primary care doctor for further evaluation.  Strict return precautions were discussed.  Vital signs are completely normal.  All questions or concerns addressed.  She is safe for discharge at this time.      RISR  EDTHIS  Physical Exam  BP 124/78 (BP Location: Left Arm)   Pulse 94   Temp 100.3 F (37.9 C) (Oral)   Resp 16   SpO2 97%   Physical Exam Vitals and nursing note reviewed.  Constitutional:      General: He is not in acute distress.    Appearance: Normal appearance.  HENT:     Head: Normocephalic and atraumatic.  Eyes:     General:        Right eye: No discharge.        Left eye: No discharge.  Cardiovascular:     Comments: Regular rate and rhythm.  S1/S2 are distinct without any evidence of murmur, rubs, or gallops.  Radial pulses are 2+ bilaterally.  Dorsalis pedis pulses are 2+ bilaterally.  No evidence of pedal edema. Pulmonary:     Comments: Clear to auscultation bilaterally.  Normal effort.  No respiratory distress.  No evidence of wheezes, rales, or rhonchi heard throughout. Abdominal:     General: Abdomen is flat. Bowel sounds are normal. There is no distension.     Tenderness: There is no abdominal tenderness. There is no guarding or rebound.  Musculoskeletal:        General: Normal range of  motion.     Cervical back: Neck supple.  Skin:    General: Skin is warm and dry.     Findings: No rash.  Neurological:     General: No focal deficit present.     Mental Status: He is alert.  Psychiatric:        Mood and Affect: Mood normal.        Behavior: Behavior normal.     Procedures  Procedures  ED Course / MDM   Clinical Course as of 09/03/22 1046  Fri Sep 03, 2022  1022 I spoke with Dr. Malen Gauze with neurology who reviewed the MRI and does not feel this is an acute stroke.  We are both in agreement this is likely symptoms of COVID. [CF]  1029 CBC with Differential(!) No evidence of leukocytosis.  There is evidence of anemia.  Although it is mild this is new in comparison to previous results. [CF]  1030 Acetaminophen level(!) Normal. [CF]  1751 Salicylate level(!) Normal. [CF]  1030 Resp panel by RT-PCR (RSV, Flu A&B, Covid) Anterior Nasal Swab(!) Patient is COVID-positive which could explain some of the symptoms that she is having. [CF]  1031 Urinalysis, Routine w reflex microscopic Urine, Clean Catch(!) Moderate amount of hematuria otherwise no abnormalities. [CF]  1031 Comprehensive metabolic panel(!) Relatively normal. [CF]  1035 Rapid urine drug screen (hospital performed) Normal. [CF]  1035 DG Chest 1 View I personally ordered and interpreted this study and do not see any evidence of pneumonia. [CF]  13 CT HEAD WO CONTRAST (5MM) I personally ordered and interpreted this study and do not see any evidence of hemorrhagic stroke. [CF]  3845 MR BRAIN WO CONTRAST I do see evidence of  [CF]    Clinical Course User Index [CF] Hendricks Limes, PA-C   Medical Decision Making Amount and/or Complexity of Data Reviewed Labs: ordered. Decision-making details documented in ED Course. Radiology: ordered. Decision-making details documented in ED Course. ECG/medicine tests: ordered.         Myna Bright [place], PA-C 09/03/22 1046    Lajean Saver, MD 09/08/22  [phone]

## 2022-09-03 NOTE — ED Notes (Signed)
Pt. To MRI

## 2022-09-03 NOTE — ED Triage Notes (Signed)
Pt arrived from home with [facility] for AMS. Family called EMS after pt woke them up. Pt presents with slurred speech and lethargy. Intermittently falls asleep during assessment however answers all orientation questions. Bp 100/60, EMS gave 497m NS. 22g IV to L forearm

## 2022-09-03 NOTE — ED Provider Notes (Signed)
[facility] Provider Note   CSN: [id number] Arrival date & time: 09/03/22  0222     History  Chief Complaint  Patient presents with   Altered Mental Status    Mercedes Gardner is a 76 y.o. adult.  The history is provided by the patient and medical records.  Altered Mental Status  76 y.o. F with hx of HTN, HLP, GERD, anemia, CVA, eczema, gout, anxiety, presenting to the ED for AMS.  Patient reportedly went to bed around 11PM, took an ibuprofen as she was having some headaches  Woke up her son and told him that she was not feeling well, family noticed slurred speech and seemed "off".  Apparently these are similar symptoms that she had with prior strokes.  She denies EtOH or other illicit substance use.  Family denies any major medication changes recently.   Family does report she is very secretive-- may actually have been having symptoms longer than she is letting on.  Also reports she has been stressed out recently which may be contributing.  Has been seen by [facility] neurology in the past, Dr. Krista Blue.  Home Medications Prior to Admission medications   Medication Sig Start Date End Date Taking? Authorizing Provider  acetaminophen-codeine (TYLENOL #3) 300-30 MG tablet Take 1 tablet by mouth every 8 (eight) hours as needed. 06/25/22   Mcarthur Rossetti, MD  amLODipine (NORVASC) 10 MG tablet take 1 tablet by mouth once daily 12/27/16   Lucious Groves, DO  busPIRone (BUSPAR) 7.5 MG tablet Take 1 tablet (7.5 mg total) by mouth 2 (two) times daily. 10/26/17   Oda Kilts, MD  clopidogrel (PLAVIX) 75 MG tablet take 1 tablet by mouth once daily 01/14/17   Lucious Groves, DO  clotrimazole-betamethasone (LOTRISONE) cream Apply to affected area 2 times daily prn 10/23/17   Tereasa Coop, PA-C  COLCRYS 0.6 MG tablet Take 1 tablet (0.6 mg total) by mouth 2 (two) times daily. 10/26/17   Oda Kilts, MD  conjugated estrogens (PREMARIN) vaginal  cream Place 1 Applicatorful vaginally daily. 02/22/17   Rice, Resa Miner, MD  diclofenac sodium (VOLTAREN) 1 % GEL Apply 2 g topically 4 (four) times daily. 10/08/17   Shelda Pal, DO  ferrous sulfate 325 (65 FE) MG tablet Take 1 tablet (325 mg total) by mouth 2 (two) times daily with a meal. 10/22/16   Angiulli, Lavon Paganini, PA-C  fluconazole (DIFLUCAN) 150 MG tablet Take 1 tablet (150 mg total) by mouth daily. 07/02/19   Bast, Tressia Miners A, NP  fluticasone (FLONASE) 50 MCG/ACT nasal spray Place 2 sprays into both nostrils as needed.  04/17/14   Presson, Audelia Hives, PA  folic acid (FOLVITE) 1 MG tablet Take 1 tablet (1 mg total) by mouth daily. 10/22/16   Angiulli, Lavon Paganini, PA-C  loratadine (CLARITIN) 10 MG tablet  05/29/17   [provider]  meclizine (ANTIVERT) 25 MG tablet Take 1/2-1 tablet up to 3 times a day as needed for dizziness may calls some drowsiness. 04/26/17   Janne Napoleon, NP  Multiple Vitamin (MULTIVITAMIN WITH MINERALS) TABS tablet Take 1 tablet by mouth daily. 10/22/16   Angiulli, Lavon Paganini, PA-C  pantoprazole (PROTONIX) 40 MG tablet TK 1 T PO QD 01/16/19   [provider]  RA ASPIRIN EC 325 MG EC tablet  04/14/17   [provider]  rosuvastatin (CRESTOR) 20 MG tablet Take 1 tablet (20 mg total) by mouth daily. 03/24/17  Lucious Groves, DO  triamcinolone cream (KENALOG) 0.5 % APPLY TO AFFECTED AREA TWICE A DAY IF NEEDED 01/04/18   [provider]      Allergies    Tramadol and Topiramate    Review of Systems   Review of Systems  Unable to perform ROS: Mental status change    Physical Exam Updated Vital Signs BP 119/76 (BP Location: Left Arm)   Pulse 96   Temp 98.5 F (36.9 C) (Oral)   Resp 16   SpO2 100%   Physical Exam Vitals and nursing note reviewed.  Constitutional:      Appearance: He is well-developed.  HENT:     Head: Normocephalic and atraumatic.  Eyes:     Conjunctiva/sclera: Conjunctivae normal.     Pupils: Pupils are  equal, round, and reactive to light.  Cardiovascular:     Rate and Rhythm: Normal rate and regular rhythm.     Heart sounds: Normal heart sounds.  Pulmonary:     Effort: Pulmonary effort is normal.     Breath sounds: Normal breath sounds.  Abdominal:     General: Bowel sounds are normal.     Palpations: Abdomen is soft.  Musculoskeletal:        General: Normal range of motion.     Cervical back: Normal range of motion.  Skin:    General: Skin is warm and dry.  Neurological:     Mental Status: He is oriented to person, place, and time.     Comments: Somnolent appearing but oriented x3 with questioning, moving all of her extremities when prompted, no focal deficits, speech is slurred and difficult to understand but appropriate     ED Results / Procedures / Treatments   Labs (all labs ordered are listed, but only abnormal results are displayed) Labs Reviewed  CBC WITH DIFFERENTIAL/PLATELET - Abnormal; Notable for the following components:      Result Value   RBC 3.67 (*)    Hemoglobin 11.6 (*)    MCV 101.4 (*)    Platelets 126 (*)    All other components within normal limits  COMPREHENSIVE METABOLIC PANEL - Abnormal; Notable for the following components:   CO2 20 (*)    Albumin 3.3 (*)    All other components within normal limits  ACETAMINOPHEN LEVEL - Abnormal; Notable for the following components:   Acetaminophen (Tylenol), Serum <10 (*)    All other components within normal limits  SALICYLATE LEVEL - Abnormal; Notable for the following components:   Salicylate Lvl <2.4 (*)    All other components within normal limits  RESP PANEL BY RT-PCR (RSV, FLU A&B, COVID)  [id number]  AMMONIA  ETHANOL  RAPID URINE DRUG SCREEN, HOSP PERFORMED  URINALYSIS, ROUTINE W REFLEX MICROSCOPIC    EKG None  Radiology CT HEAD WO CONTRAST (5MM)  Result Date: 09/03/2022 CLINICAL DATA:  Delirium EXAM: CT HEAD WITHOUT CONTRAST TECHNIQUE: Contiguous axial images were obtained from the base of  the skull through the vertex without intravenous contrast. RADIATION DOSE REDUCTION: This exam was performed according to the departmental dose-optimization program which includes automated exposure control, adjustment of the mA and/or kV according to patient size and/or use of iterative reconstruction technique. COMPARISON:  CT head 11/29/2016, MRI head 09/21/2016 FINDINGS: Brain: Patchy and confluent areas of decreased attenuation are noted throughout the deep and periventricular white matter of the cerebral hemispheres bilaterally, compatible with chronic microvascular ischemic disease. No evidence of large-territorial acute infarction. No parenchymal hemorrhage. No mass lesion.  No extra-axial collection. No mass effect or midline shift. No hydrocephalus. Basilar cisterns are patent. Vascular: No hyperdense vessel. Skull: No acute fracture or focal lesion. Sinuses/Orbits: Paranasal sinuses and mastoid air cells are clear. The orbits are unremarkable. Other: None. IMPRESSION: No acute intracranial abnormality. Electronically Signed   By: Iven Finn M.D.   On: 09/03/2022 03:17   DG Chest 1 View  Result Date: 09/03/2022 CLINICAL DATA:  AMS EXAM: CHEST  1 VIEW COMPARISON:  Chest x-ray 10/11/2016, CT chest 10/12/2016 FINDINGS: The heart and mediastinal contours are unchanged. Aortic calcifications. No focal consolidation. No pulmonary edema. No pleural effusion. No pneumothorax. No acute osseous abnormality. IMPRESSION: 1. No active disease. 2.  Aortic Atherosclerosis (ICD10-I70.0). Electronically Signed   By: Iven Finn M.D.   On: 09/03/2022 03:15    Procedures Procedures    Medications Ordered in ED Medications - No data to display  ED Course/ Medical Decision Making/ A&P                           Medical Decision Making Amount and/or Complexity of Data Reviewed Labs: ordered. Radiology: ordered and independent interpretation performed. ECG/medicine tests: ordered and independent  interpretation performed.   76 year old female presenting to the ED with altered mental status.  Reportedly went to bed around 11 PM, did take Motrin for mild headache at that time.  Woke up just prior to arrival, alerted son that she did not feel well and noticed that speech was slurred and she seemed "off".  Family is concerned as she had similar symptoms with prior strokes.  Patient is somewhat somnolent on exam and falls asleep easily, however she does arouse when spoken to.  She is answering questions and following commands appropriately.  She does not have any focal deficits.  Her speech is slurred and somewhat difficult to understand, however answers to questions are appropriate.  Will obtain CT head, chest x-ray, labs.  4:54 AM On re-check family expresses that she is starting to sound somewhat better, however speech not back to baseline.  Labs thus far reassuring-- no leukocytosis, no electrolyte derangement.  Negative Tylenol, salicylate, and ethanol levels.  UA, UDS, and RVP pending.  CT head negative.  Chest x-ray is clear.  All results discussed with family at bedside, they acknowledged understanding.  Given her persistent symptoms and hx of prior stroke with similar, will obtain MRI brain.  Family agreeable.  6:30 AM Patient still awaiting MRI.  Care will be signed out to oncoming provider.  Final Clinical Impression(s) / ED Diagnoses Final diagnoses:  Slurred speech    Rx / DC Orders ED Discharge Orders     None         Larene Pickett, PA-C 09/03/22 0630    Mesner, Corene Cornea, MD 09/03/22 [phone]

## 2022-09-03 NOTE — Discharge Planning (Signed)
  Transition of Care [facility]) Screening Note   Patient Details  Name: MAKEISHA JENTSCH Date of Birth: [birth year]   Transition of Care [facility] CM/SW Contact:    Fuller Mandril, RN Phone Number: 09/03/2022, 11:04 AM    Transition of Care Department [facility]) has reviewed patient and no TOC needs have been identified at this time. We will continue to monitor patient advancement through interdisciplinary progression rounds. If new patient transition needs arise, please place a TOC consult.

## 2022-09-08 ENCOUNTER — Other Ambulatory Visit: Payer: Self-pay | Admitting: Orthopaedic Surgery

## 2022-09-08 ENCOUNTER — Telehealth: Payer: Self-pay | Admitting: Orthopaedic Surgery

## 2022-09-08 MED ORDER — ACETAMINOPHEN-CODEINE 300-30 MG PO TABS: 1.0000 | ORAL_TABLET | Freq: Three times a day (TID) | ORAL | 0 refills | Status: DC | PRN

## 2022-09-08 NOTE — Telephone Encounter (Signed)
Please advise 

## 2022-09-08 NOTE — Telephone Encounter (Signed)
Patient need Tylenol 3 refill sent to [facility] on [address] please advise

## 2022-10-29 ENCOUNTER — Other Ambulatory Visit: Payer: Self-pay | Admitting: Orthopaedic Surgery

## 2022-11-08 ENCOUNTER — Ambulatory Visit (INDEPENDENT_AMBULATORY_CARE_PROVIDER_SITE_OTHER): Payer: Medicare (Managed Care) | Admitting: Physician Assistant

## 2022-11-08 ENCOUNTER — Encounter: Payer: Self-pay | Admitting: Physician Assistant

## 2022-11-08 DIAGNOSIS — M1712 Unilateral primary osteoarthritis, left knee: Secondary | ICD-10-CM

## 2022-11-08 DIAGNOSIS — M1711 Unilateral primary osteoarthritis, right knee: Secondary | ICD-10-CM

## 2022-11-08 DIAGNOSIS — M170 Bilateral primary osteoarthritis of knee: Secondary | ICD-10-CM | POA: Diagnosis not present

## 2022-11-08 MED ORDER — LIDOCAINE HCL 1 % IJ SOLN
3.0000 mL | INTRAMUSCULAR | Status: AC | PRN
  Administered 2022-11-08: 3 mL

## 2022-11-08 MED ORDER — METHYLPREDNISOLONE ACETATE 40 MG/ML IJ SUSP
40.0000 mg | INTRAMUSCULAR | Status: AC | PRN
  Administered 2022-11-08: 40 mg via INTRA_ARTICULAR

## 2022-11-08 NOTE — Progress Notes (Signed)
   Procedure Note  Patient: Mercedes Gardner             Date of Birth: 06-Mar-1946           MRN: [id number]             Visit Date: 11/08/2022 HPI: [name] comes in today requesting injections of both knees.  She has known bilateral knee arthritic changes.  She states that her last injections on 07/05/2022 gave her good relief until recently.  No change in her overall medical status.  Denies any fevers chills.  Physical exam: General well-developed well-nourished female no acute distress.  Bilateral knees with slight varus deformities.  No instability varus valgus stressing.  No effusion abnormal warmth to either knee.  Procedures: Visit Diagnoses:  1. Unilateral primary osteoarthritis, right knee   2. Unilateral primary osteoarthritis, left knee     Large Joint Inj: bilateral knee on 11/08/2022 9:32 AM Indications: pain Details: 22 G 1.5 in needle, anterolateral approach  Arthrogram: No  Medications (Right): 3 mL lidocaine 1 %; 40 mg methylPREDNISolone acetate 40 MG/ML Medications (Left): 3 mL lidocaine 1 %; 40 mg methylPREDNISolone acetate 40 MG/ML Outcome: tolerated well, no immediate complications Procedure, treatment alternatives, risks and benefits explained, specific risks discussed. Consent was given by the patient. Immediately prior to procedure a time out was called to verify the correct patient, procedure, equipment, support staff and site/side marked as required. Patient was prepped and draped in the usual sterile fashion.    Plan: She will follow-up with [place] as needed todayz to wait at least 3 months between injections.  Patient tolerated injections well.

## 2022-12-28 ENCOUNTER — Other Ambulatory Visit: Payer: Self-pay | Admitting: Orthopaedic Surgery

## 2022-12-29 NOTE — Telephone Encounter (Signed)
[facility] patient 

## 2022-12-30 ENCOUNTER — Other Ambulatory Visit: Payer: Self-pay | Admitting: Physician Assistant

## 2022-12-30 MED ORDER — ACETAMINOPHEN-CODEINE 300-30 MG PO TABS: 1.0000 | ORAL_TABLET | Freq: Three times a day (TID) | ORAL | 0 refills | Status: DC | PRN

## 2023-01-31 ENCOUNTER — Telehealth: Payer: Self-pay | Admitting: Physician Assistant

## 2023-01-31 NOTE — Telephone Encounter (Signed)
Patient requested Tylenol 3 sent to [facility] on  [address] please advise

## 2023-02-01 ENCOUNTER — Other Ambulatory Visit: Payer: Self-pay

## 2023-02-01 ENCOUNTER — Inpatient Hospital Stay (HOSPITAL_COMMUNITY)
Admission: EM | Admit: 2023-02-01 | Discharge: 2023-02-02 | DRG: 887 | Disposition: A | Discharge status: Home / Self Care | Payer: Medicare (Managed Care) | Attending: Neurology | Admitting: Neurology

## 2023-02-01 ENCOUNTER — Encounter (HOSPITAL_COMMUNITY): Payer: Self-pay

## 2023-02-01 ENCOUNTER — Inpatient Hospital Stay (HOSPITAL_COMMUNITY): Payer: Medicare (Managed Care)

## 2023-02-01 ENCOUNTER — Emergency Department (HOSPITAL_COMMUNITY): Payer: Medicare (Managed Care)

## 2023-02-01 DIAGNOSIS — F4389 Other reactions to severe stress: Secondary | ICD-10-CM | POA: Diagnosis present

## 2023-02-01 DIAGNOSIS — M1712 Unilateral primary osteoarthritis, left knee: Secondary | ICD-10-CM | POA: Diagnosis present

## 2023-02-01 DIAGNOSIS — M109 Gout, unspecified: Secondary | ICD-10-CM | POA: Diagnosis present

## 2023-02-01 DIAGNOSIS — Z8673 Personal history of transient ischemic attack (TIA), and cerebral infarction without residual deficits: Secondary | ICD-10-CM

## 2023-02-01 DIAGNOSIS — F17210 Nicotine dependence, cigarettes, uncomplicated: Secondary | ICD-10-CM | POA: Diagnosis present

## 2023-02-01 DIAGNOSIS — I6389 Other cerebral infarction: Secondary | ICD-10-CM | POA: Diagnosis not present

## 2023-02-01 DIAGNOSIS — K5900 Constipation, unspecified: Secondary | ICD-10-CM | POA: Diagnosis present

## 2023-02-01 DIAGNOSIS — K219 Gastro-esophageal reflux disease without esophagitis: Secondary | ICD-10-CM | POA: Diagnosis present

## 2023-02-01 DIAGNOSIS — Z79899 Other long term (current) drug therapy: Secondary | ICD-10-CM

## 2023-02-01 DIAGNOSIS — Z888 Allergy status to other drugs, medicaments and biological substances status: Secondary | ICD-10-CM

## 2023-02-01 DIAGNOSIS — F419 Anxiety disorder, unspecified: Secondary | ICD-10-CM | POA: Diagnosis present

## 2023-02-01 DIAGNOSIS — R4701 Aphasia: Secondary | ICD-10-CM | POA: Diagnosis present

## 2023-02-01 DIAGNOSIS — F4489 Other dissociative and conversion disorders: Secondary | ICD-10-CM | POA: Diagnosis present

## 2023-02-01 DIAGNOSIS — E785 Hyperlipidemia, unspecified: Secondary | ICD-10-CM

## 2023-02-01 DIAGNOSIS — G8929 Other chronic pain: Secondary | ICD-10-CM | POA: Diagnosis present

## 2023-02-01 DIAGNOSIS — Z7902 Long term (current) use of antithrombotics/antiplatelets: Secondary | ICD-10-CM | POA: Diagnosis not present

## 2023-02-01 DIAGNOSIS — I10 Essential (primary) hypertension: Secondary | ICD-10-CM | POA: Diagnosis present

## 2023-02-01 DIAGNOSIS — G8194 Hemiplegia, unspecified affecting left nondominant side: Secondary | ICD-10-CM | POA: Diagnosis present

## 2023-02-01 DIAGNOSIS — I6300 Cerebral infarction due to thrombosis of unspecified precerebral artery: Secondary | ICD-10-CM | POA: Diagnosis not present

## 2023-02-01 DIAGNOSIS — Z7982 Long term (current) use of aspirin: Secondary | ICD-10-CM | POA: Diagnosis not present

## 2023-02-01 DIAGNOSIS — R531 Weakness: Secondary | ICD-10-CM

## 2023-02-01 DIAGNOSIS — Z803 Family history of malignant neoplasm of breast: Secondary | ICD-10-CM

## 2023-02-01 DIAGNOSIS — Z833 Family history of diabetes mellitus: Secondary | ICD-10-CM

## 2023-02-01 DIAGNOSIS — R000 Tachycardia, unspecified: Secondary | ICD-10-CM | POA: Diagnosis present

## 2023-02-01 DIAGNOSIS — R471 Dysarthria and anarthria: Secondary | ICD-10-CM | POA: Diagnosis present

## 2023-02-01 DIAGNOSIS — I639 Cerebral infarction, unspecified: Secondary | ICD-10-CM | POA: Diagnosis not present

## 2023-02-01 DIAGNOSIS — D649 Anemia, unspecified: Secondary | ICD-10-CM | POA: Diagnosis present

## 2023-02-01 DIAGNOSIS — Z8249 Family history of ischemic heart disease and other diseases of the circulatory system: Secondary | ICD-10-CM

## 2023-02-01 DIAGNOSIS — R2990 Unspecified symptoms and signs involving the nervous system: Principal | ICD-10-CM

## 2023-02-01 DIAGNOSIS — M4802 Spinal stenosis, cervical region: Secondary | ICD-10-CM | POA: Diagnosis present

## 2023-02-01 LAB — ECHOCARDIOGRAM COMPLETE
AR max vel: 1.79 cm2
AV Area VTI: 1.75 cm2
AV Area mean vel: 1.8 cm2
AV Mean grad: 11 mmHg
AV Peak grad: 18.4 mmHg
Ao pk vel: 2.15 m/s
Area-P 1/2: 2.36 cm2
MV VTI: 2.77 cm2
S' Lateral: 1.8 cm
Weight: 1827.17 oz

## 2023-02-01 LAB — DIFFERENTIAL
Abs Immature Granulocytes: 0.01 10*3/uL (ref 0.00–0.07)
Basophils Absolute: 0 10*3/uL (ref 0.0–0.1)
Basophils Relative: 1 %
Eosinophils Absolute: 0.1 10*3/uL (ref 0.0–0.5)
Eosinophils Relative: 2 %
Immature Granulocytes: 0 %
Lymphocytes Relative: 17 %
Lymphs Abs: 0.9 10*3/uL (ref 0.7–4.0)
Monocytes Absolute: 0.3 10*3/uL (ref 0.1–1.0)
Monocytes Relative: 5 %
Neutro Abs: 4.1 10*3/uL (ref 1.7–7.7)
Neutrophils Relative %: 75 %

## 2023-02-01 LAB — RAPID URINE DRUG SCREEN, HOSP PERFORMED
Amphetamines: NOT DETECTED
Barbiturates: NOT DETECTED
Benzodiazepines: NOT DETECTED
Cocaine: NOT DETECTED
Opiates: NOT DETECTED
Tetrahydrocannabinol: NOT DETECTED

## 2023-02-01 LAB — CBC
HCT: 38.8 % (ref 36.0–46.0)
Hemoglobin: 12.1 g/dL (ref 12.0–15.0)
MCH: 30.8 pg (ref 26.0–34.0)
MCHC: 31.2 g/dL (ref 30.0–36.0)
MCV: 98.7 fL (ref 80.0–100.0)
Platelets: 168 10*3/uL (ref 150–400)
RBC: 3.93 MIL/uL (ref 3.87–5.11)
RDW: 13.4 % (ref 11.5–15.5)
WBC: 5.5 10*3/uL (ref 4.0–10.5)
nRBC: 0 % (ref 0.0–0.2)

## 2023-02-01 LAB — I-STAT CHEM 8, ED
BUN: 17 mg/dL (ref 8–23)
Calcium, Ion: 1.26 mmol/L (ref 1.15–1.40)
Chloride: 112 mmol/L — ABNORMAL HIGH (ref 98–111)
Creatinine, Ser: 0.6 mg/dL (ref 0.44–1.00)
Glucose, Bld: 120 mg/dL — ABNORMAL HIGH (ref 70–99)
HCT: 38 % (ref 36.0–46.0)
Hemoglobin: 12.9 g/dL (ref 12.0–15.0)
Potassium: 3.9 mmol/L (ref 3.5–5.1)
Sodium: 143 mmol/L (ref 135–145)
TCO2: 21 mmol/L — ABNORMAL LOW (ref 22–32)

## 2023-02-01 LAB — COMPREHENSIVE METABOLIC PANEL
ALT: 12 U/L (ref 0–44)
AST: 15 U/L (ref 15–41)
Albumin: 3.5 g/dL (ref 3.5–5.0)
Alkaline Phosphatase: 94 U/L (ref 38–126)
Anion gap: 9 (ref 5–15)
BUN: 14 mg/dL (ref 8–23)
CO2: 19 mmol/L — ABNORMAL LOW (ref 22–32)
Calcium: 9.9 mg/dL (ref 8.9–10.3)
Chloride: 109 mmol/L (ref 98–111)
Creatinine, Ser: 0.85 mg/dL (ref 0.44–1.00)
GFR, Estimated: >60 mL/min (ref >60)
Glucose, Bld: 121 mg/dL — ABNORMAL HIGH (ref 70–99)
Potassium: 3.6 mmol/L (ref 3.5–5.1)
Sodium: 137 mmol/L (ref 135–145)
Total Bilirubin: 0.7 mg/dL (ref 0.3–1.2)
Total Protein: 7.2 g/dL (ref 6.5–8.1)

## 2023-02-01 LAB — APTT: aPTT: 29 seconds (ref 24–36)

## 2023-02-01 LAB — PROTIME-INR
INR: 1.1 (ref 0.8–1.2)
Prothrombin Time: 14.1 seconds (ref 11.4–15.2)

## 2023-02-01 LAB — ETHANOL: Alcohol, Ethyl (B): <10 mg/dL (ref <10)

## 2023-02-01 LAB — MRSA NEXT GEN BY PCR, NASAL: MRSA by PCR Next Gen: NOT DETECTED

## 2023-02-01 LAB — CBG MONITORING, ED: Glucose-Capillary: 123 mg/dL — ABNORMAL HIGH (ref 70–99)

## 2023-02-01 MED ORDER — FERROUS SULFATE 325 (65 FE) MG PO TABS
325.0000 mg | ORAL_TABLET | Freq: Two times a day (BID) | ORAL | Status: DC
  Administered 2023-02-01 – 2023-02-02 (×3): 325 mg via ORAL
  Filled 2023-02-01 (×2): qty 1

## 2023-02-01 MED ORDER — BUSPIRONE HCL 15 MG PO TABS
7.5000 mg | ORAL_TABLET | Freq: Two times a day (BID) | ORAL | Status: DC
  Administered 2023-02-01 – 2023-02-02 (×3): 7.5 mg via ORAL
  Filled 2023-02-01 (×4): qty 1

## 2023-02-01 MED ORDER — LABETALOL HCL 5 MG/ML IV SOLN: 20.0000 mg | Freq: Once | INTRAVENOUS | Status: DC

## 2023-02-01 MED ORDER — CHLORHEXIDINE GLUCONATE CLOTH 2 % EX PADS: 6.0000 | MEDICATED_PAD | Freq: Every day | CUTANEOUS | Status: DC

## 2023-02-01 MED ORDER — ACETAMINOPHEN 160 MG/5ML PO SOLN: 650.0000 mg | ORAL | Status: DC | PRN

## 2023-02-01 MED ORDER — SODIUM CHLORIDE 0.9 % IV SOLN: INTRAVENOUS | Status: DC

## 2023-02-01 MED ORDER — TENECTEPLASE FOR STROKE
0.2500 mg/kg | PACK | Freq: Once | INTRAVENOUS | Status: AC
  Administered 2023-02-01: 13 mg via INTRAVENOUS
  Filled 2023-02-01: qty 10

## 2023-02-01 MED ORDER — PERFLUTREN LIPID MICROSPHERE
1.0000 mL | INTRAVENOUS | Status: AC | PRN
  Administered 2023-02-01: 3 mL via INTRAVENOUS

## 2023-02-01 MED ORDER — SODIUM CHLORIDE 0.9% FLUSH
3.0000 mL | Freq: Once | INTRAVENOUS | Status: AC
  Administered 2023-02-01: 3 mL via INTRAVENOUS

## 2023-02-01 MED ORDER — CLEVIDIPINE BUTYRATE 0.5 MG/ML IV EMUL: 0.0000 mg/h | INTRAVENOUS | Status: DC

## 2023-02-01 MED ORDER — STROKE: EARLY STAGES OF RECOVERY BOOK
Freq: Once | Status: AC
  Filled 2023-02-01: qty 1

## 2023-02-01 MED ORDER — ACETAMINOPHEN 650 MG RE SUPP: 650.0000 mg | RECTAL | Status: DC | PRN

## 2023-02-01 MED ORDER — PANTOPRAZOLE SODIUM 40 MG IV SOLR
40.0000 mg | Freq: Every day | INTRAVENOUS | Status: DC
  Administered 2023-02-01: 40 mg via INTRAVENOUS
  Filled 2023-02-01: qty 10

## 2023-02-01 MED ORDER — ORAL CARE MOUTH RINSE: 15.0000 mL | OROMUCOSAL | Status: DC | PRN

## 2023-02-01 MED ORDER — ADULT MULTIVITAMIN W/MINERALS CH
1.0000 | ORAL_TABLET | Freq: Every day | ORAL | Status: DC
  Administered 2023-02-01 – 2023-02-02 (×2): 1 via ORAL
  Filled 2023-02-01 (×2): qty 1

## 2023-02-01 MED ORDER — SENNOSIDES-DOCUSATE SODIUM 8.6-50 MG PO TABS: 1.0000 | ORAL_TABLET | Freq: Every evening | ORAL | Status: DC | PRN

## 2023-02-01 MED ORDER — FOLIC ACID 1 MG PO TABS
1.0000 mg | ORAL_TABLET | Freq: Every day | ORAL | Status: DC
  Administered 2023-02-01 – 2023-02-02 (×2): 1 mg via ORAL
  Filled 2023-02-01 (×2): qty 1

## 2023-02-01 MED ORDER — ACETAMINOPHEN 325 MG PO TABS
650.0000 mg | ORAL_TABLET | ORAL | Status: DC | PRN
  Administered 2023-02-01 – 2023-02-02 (×2): 650 mg via ORAL
  Filled 2023-02-01 (×3): qty 2

## 2023-02-01 MED ORDER — IOHEXOL 350 MG/ML SOLN
75.0000 mL | Freq: Once | INTRAVENOUS | Status: AC | PRN
  Administered 2023-02-01: 75 mL via INTRAVENOUS

## 2023-02-01 MED ORDER — ROSUVASTATIN CALCIUM 20 MG PO TABS
20.0000 mg | ORAL_TABLET | Freq: Every day | ORAL | Status: DC
  Administered 2023-02-01 – 2023-02-02 (×2): 20 mg via ORAL
  Filled 2023-02-01 (×2): qty 1

## 2023-02-01 NOTE — Progress Notes (Signed)
  Echocardiogram 2D Echocardiogram has been performed.  Milda Smart 02/01/2023, 11:28 AM

## 2023-02-01 NOTE — Code Documentation (Signed)
Louvina Sobieski is a 77 yr old female with a PMH of hypertension and prior stroke with no deficits. She is arriving to [facility] on 02/01/2023 via EMS. Pt is from home where she had a witnessed onset of aphasia and weakness per family. She takes plavix.    Pt met by Stroke team at bridge. CBG, labs obtained. Airway cleared by EDP. Pt is drowsy, has drift in all extremities (worse on rt), rt sensory loss, rt extinction to DSS and dysarthria. Pt with aphasia per neurologist. NIHSS 9. Pt to CT with team. The following imaging was obtained: CT, CTA. CT negative for hemorrhage per neurologist. CTA negative for LVO per neurologist. After discussion with family, to obtain consent, TNK given at [phone].    Pt back to room 32 where her workup will continue. She will need q 15 min VS and NIHSS for 2 hrs, then q 30 min for 6 hours, then hourly for 16 hours.  Bedside handoff with [facility] RN complete. Pt not eligible for thrombectomy due to LVO negative.[name] Kitchen

## 2023-02-01 NOTE — Progress Notes (Signed)
SLP Cancellation Note  Patient Details Name: Mercedes Gardner MRN: [id number] DOB: 16-Feb-1946   Cancelled treatment:       Reason Eval/Treat Not Completed: Other (comment). Pt asked to rest. RN planning to do [facility] when pt more alert.    Sidney Silberman, Riley Nearing 02/01/2023, 1:56 PM

## 2023-02-01 NOTE — H&P (Signed)
Neurology H&P  CC: aphasia and weakness  History is obtained from:patient, EMS, family and chart  HPI: Mercedes Gardner is a 77 y.o. 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  Patient was last seen normal by her family at about 7:00 this morning, when she was getting ready to go to work.  She then began having slurred speech and difficulty finding words.  EMS was called, and patient was brought to the hospital.  She was noted to have dysarthria, mild aphasia and drift in all 4 extremities.  Patient states that she has felt dehydrated lately and has not been drinking enough fluid today, that she occasionally has a fast heartbeat and that she has occasional trouble breathing and intermittent constipation.   LKW: 0700 TNK given?:  Yes, at 0835, some delay due to obtaining consent from family IR Thrombectomy? No, no LVO Modified Rankin Scale: 0-Completely asymptomatic and back to baseline post- stroke  NIHSS:  1a Level of Conscious.: 1 1b LOC Questions: 0 1c LOC Commands: 0 2 Best Gaze: 0 3 Visual: 0 4 Facial Palsy: 0 5a Motor Arm - left: 1 5b Motor Arm - Right: 1 6a Motor Leg - Left: 1 6b Motor Leg - Right: 1 7 Limb Ataxia: 0 8 Sensory: 1 9 Best Language: 1 10 Dysarthria: 1 11 Extinct and Inattention.: 1 TOTAL: 9      ROS: A complete ROS was performed and is negative except as noted in the HPI.  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 He has been smoking an average of .02 packs per day. He has never used smokeless tobacco. He reports that he does not drink alcohol and does not use drugs.   Prior to Admission medications   Medication Sig Start Date End Date Taking? Authorizing Provider  acetaminophen-codeine (TYLENOL #3) 300-30 MG tablet Take 1 tablet by mouth every 8 (eight) hours as needed. 12/30/22   Kirtland Bouchard, PA-C  amLODipine (NORVASC) 10 MG tablet take 1 tablet by mouth once daily 12/27/16   Gust Rung, DO  busPIRone (BUSPAR) 7.5 MG tablet Take 1 tablet (7.5 mg total) by mouth 2 (two) times daily. 10/26/17   Anne Shutter, MD  clopidogrel (  PLAVIX) 75 MG tablet take 1 tablet by mouth once daily 01/14/17   Gust Rung, DO  clotrimazole-betamethasone (LOTRISONE) cream Apply to affected area 2 times daily prn 10/23/17   Ofilia Neas, PA-C  COLCRYS 0.6 MG tablet Take 1 tablet (0.6 mg total) by mouth 2 (two) times daily. 10/26/17   Anne Shutter, MD  conjugated estrogens (PREMARIN) vaginal cream Place 1 Applicatorful vaginally daily. 02/22/17   Rice, Jamesetta Orleans, MD  diclofenac sodium (VOLTAREN) 1 % GEL Apply 2 g topically 4 (four) times daily. 10/08/17   Sharlene Dory, DO  ferrous sulfate 325 (65 FE) MG tablet Take 1 tablet (325 mg total) by mouth 2 (two) times daily with a meal. 10/22/16   Angiulli, Mcarthur Rossetti, PA-C  fluconazole (DIFLUCAN) 150 MG tablet Take 1 tablet (150 mg total) by mouth daily. 07/02/19   Bast, Gloris Manchester A, NP  fluticasone (FLONASE) 50  MCG/ACT nasal spray Place 2 sprays into both nostrils as needed.  04/17/14   Presson, Mathis Fare, PA  folic acid (FOLVITE) 1 MG tablet Take 1 tablet (1 mg total) by mouth daily. 10/22/16   Angiulli, Mcarthur Rossetti, PA-C  loratadine (CLARITIN) 10 MG tablet  05/29/17   [provider]  meclizine (ANTIVERT) 25 MG tablet Take 1/2-1 tablet up to 3 times a day as needed for dizziness may calls some drowsiness. 04/26/17   Hayden Rasmussen, NP  Multiple Vitamin (MULTIVITAMIN WITH MINERALS) TABS tablet Take 1 tablet by mouth daily. 10/22/16   Angiulli, Mcarthur Rossetti, PA-C  pantoprazole (PROTONIX) 40 MG tablet TK 1 T PO QD 01/16/19   [provider]  RA ASPIRIN EC 325 MG EC tablet  04/14/17   [provider]  rosuvastatin (CRESTOR) 20 MG tablet Take 1 tablet (20 mg total) by mouth daily. 03/24/17   Gust Rung, DO  triamcinolone cream (KENALOG) 0.5 % APPLY TO AFFECTED AREA TWICE A DAY IF NEEDED 01/04/18   [provider]     Exam: Current vital signs: BP (!) 147/83   Pulse 96   Resp 16   Wt 51.8 kg   SpO2 100%   BMI 18.71 kg/m    Physical Exam  Constitutional: Appears well-developed and well-nourished.  Psych: Affect appropriate to situation Eyes: No scleral injection HENT: No OP obstrucion Head: Normocephalic.  Cardiovascular: Normal rate and regular rhythm, systolic murmur heard best over aortic area Respiratory: Effort normal and breath sounds normal to anterior ascultation GI: Soft.  No distension. There is no tenderness.  Hypoactive bowel sounds Skin: WDI  Neuro: Mental Status: Patient is awake, alert, oriented to person, place, month, year, and situation, able to follow simple and complex commands but sometimes drifts off during conversation. Patient is able to give some history. Speech is slightly dysarthric and halting, patient is able to identify objects and pictures on NIHSS cards.  Tempo of speech is slightly slow, and patient sometimes loses track of thoughts  during conversation Cranial Nerves: II: Visual Fields are full. Pupils are equal, round, and reactive to light.   III,IV, VI: EOMI without ptosis or diploplia.  V: Facial sensation is slightly diminished on the right VII: Facial movement is symmetric resting and smiling VIII: Hearing is intact to voice X: Voice slightly dysarthric XI: Shoulder shrug is symmetric. XII: Tongue is midline without atrophy or fasciculations.  Motor: Tone is normal. Bulk is normal. 5/5 strength was present in all four extremities but drift present in all 4 extremities Sensory: Sensation is slightly  diminished on the right Cerebellar: FNF are intact bilaterally   I have reviewed labs in epic and the pertinent results are:    Latest Ref Rng & Units 02/01/2023    8:07 AM 02/01/2023    8:03 AM 09/03/2022    3:50 AM  CBC  WBC 4.0 - 10.5 K/uL  5.5  5.1   Hemoglobin 12.0 - 15.0 g/dL 40.9  81.1  91.4   Hematocrit 36.0 - 46.0 % 38.0  38.8  37.2   Platelets 150 - 400 K/uL  168  126        Latest Ref Rng & Units 02/01/2023    8:07 AM 02/01/2023    8:03 AM 09/03/2022    3:50 AM  BMP  Glucose 70 - 99 mg/dL 782  956  89   BUN 8 - 23 mg/dL 17  14  10    Creatinine 0.44 - 1.00 mg/dL 2.13  0.86  5.78   Sodium 135 - 145 mmol/L 143  137  139   Potassium 3.5 - 5.1 mmol/L 3.9  3.6  3.7   Chloride 98 - 111 mmol/L 112  109  110   CO2 22 - 32 mmol/L  19  20   Calcium 8.9 - 10.3 mg/dL  9.9  9.1      I have reviewed the images obtained:  CT head: No acute abnormalities  CTA head and neck: No LVO or hemodynamically significant stenosis  Impression: Acute ischemic stroke status post TNK  Recommendations - Admit to ICU for post TNK monitoring -Keep systolic blood pressure less than 180/105, use IV labetalol and clevidipine if needed - MRI brain wo contrast at 8:00 tomorrow - TTE w/ bubble - Check A1c and LDL + add statin per guidelines - antiplt/anticoag to start 24 hours after TNK administration -Neurochecks  and vital signs every 15 minutes x 2 hours, every 30 minutes x 6 hours and hourly after that - STAT head CT for any change in neuro exam - Tele - PT/OT/SLP - Stroke education - Amb referral to neurology upon discharge     Assessment and plan discussed with with attending physician and they are in agreement.    Cortney E Ernestina Columbia , MSN, AGACNP-BC [facility] See Amion for schedule and pager information 02/01/2023 8:56 AM   Attending Neurohospitalist Addendum Patient seen and examined with APP/Resident. Agree with the history and physical as documented above. Agree with the plan as documented, which I helped formulate. I have edited the note above to reflect my full findings and recommendations. I have independently reviewed the chart, obtained history, review of systems and examined the patient.I have personally reviewed pertinent head/neck/spine imaging (CT/MRI). Please feel free to call with any questions.  There was a delay in TNK administration 2/2 children initially not being able to make a decision whether to give it. Eldest daughter April then arrived to the hospital and gave consent after discussion with other 2 children. Risks, benefits, and alternatives to TNK were discussed at length with all 3 adult children (next of kin).  This patient is critically ill and at significant risk of neurological worsening, death and care requires constant monitoring of vital signs, hemodynamics,respiratory and cardiac monitoring, neurological assessment, discussion with family, other specialists and medical decision making of high complexity. I spent 90 minutes of neurocritical care time  in the care of  this patient. This was time spent independent of any time provided by nurse practitioner or PA.  Bing Neighbors, MD  [facility] [phone]  If 7pm- 7am, please page neurology on call as listed in AMION.   -- Bing Neighbors, MD [facility] [phone]  If 7pm- 7am, please page neurology on call as listed in AMION.

## 2023-02-01 NOTE — Progress Notes (Signed)
TNK given at [phone]

## 2023-02-01 NOTE — ED Triage Notes (Signed)
Pt BIBGEMS from home, while patient was getting ready for work she started to c/o an unwell feeling, dizziness, and loss of balance. While pt was talking to the family she was dysarthric then had expressive aphasia. She was weak in all four extremities. When she tried to ambulate with EMS she favored her right side.   Hx CVA  On Plavix  LNW 0710 CBG 88 HR 122 BP 120/70

## 2023-02-01 NOTE — Progress Notes (Signed)
PHARMACIST CODE STROKE RESPONSE  Notified to mix TNK at 0831 by Dr. Selina Cooley TNK preparation completed at 0834  TNK dose = 13 mg IV over 5 seconds  Issues/delays encountered (if applicable): Some delay with family on whether to give TNK. No pharmacy specific or mixing delays.   Blane Ohara, PharmD  PGY1 Pharmacy Resident

## 2023-02-01 NOTE — ED Provider Notes (Signed)
[place] EMERGENCY DEPARTMENT AT [facility] Provider Note   CSN: [id number] Arrival date & time: 02/01/23  4098  An emergency department physician performed an initial assessment on this suspected stroke patient at 0804.  History  Chief Complaint  Patient presents with   Code Stroke    Mercedes Gardner is a 77 y.o. adult.  HPI Patient presents for strokelike symptoms.  Medical history includes anemia, GERD, gout, HTN, arthritis, and prior CVA.  Prior CVA was in 2018.  It involved right pons with additional small foci in right cerebellum, occipital lobe, and paramedian parietal lobe.  She had resolution of residual symptoms.  She is currently working as a Lawyer.  This morning, she woke up in her normal state of health.  As she was getting ready for work, she experienced some dizziness.  Family noted progressive dysarthria.  Onset of symptoms was 7:10 AM.  EMS was called at 7:22 AM.  EMS noted persistent dysarthria in addition to gait instability.  They report the patient appeared to be favoring her right side.  Patient arrives as a code stroke.  EMS noted tachycardia and hypertension prior to arrival.  Vital signs were otherwise normal.  Per chart review, patient is prescribed Plavix.    Home Medications Prior to Admission medications   Medication Sig Start Date End Date Taking? Authorizing Provider  acetaminophen-codeine (TYLENOL #3) 300-30 MG tablet Take 1 tablet by mouth every 8 (eight) hours as needed. 12/30/22   Kirtland Bouchard, PA-C  amLODipine (NORVASC) 10 MG tablet take 1 tablet by mouth once daily 12/27/16   Gust Rung, DO  busPIRone (BUSPAR) 7.5 MG tablet Take 1 tablet (7.5 mg total) by mouth 2 (two) times daily. 10/26/17   Anne Shutter, MD  clopidogrel (PLAVIX) 75 MG tablet take 1 tablet by mouth once daily 01/14/17   Gust Rung, DO  clotrimazole-betamethasone (LOTRISONE) cream Apply to affected area 2 times daily prn 10/23/17   Ofilia Neas, PA-C  COLCRYS 0.6 MG tablet Take 1 tablet (0.6 mg total) by mouth 2 (two) times daily. 10/26/17   Anne Shutter, MD  conjugated estrogens (PREMARIN) vaginal cream Place 1 Applicatorful vaginally daily. 02/22/17   Rice, Jamesetta Orleans, MD  diclofenac sodium (VOLTAREN) 1 % GEL Apply 2 g topically 4 (four) times daily. 10/08/17   Sharlene Dory, DO  ferrous sulfate 325 (65 FE) MG tablet Take 1 tablet (325 mg total) by mouth 2 (two) times daily with a meal. 10/22/16   Angiulli, Mcarthur Rossetti, PA-C  fluconazole (DIFLUCAN) 150 MG tablet Take 1 tablet (150 mg total) by mouth daily. 07/02/19   Bast, Gloris Manchester A, NP  fluticasone (FLONASE) 50 MCG/ACT nasal spray Place 2 sprays into both nostrils as needed.  04/17/14   Presson, Mathis Fare, PA  folic acid (FOLVITE) 1 MG tablet Take 1 tablet (1 mg total) by mouth daily. 10/22/16   Angiulli, Mcarthur Rossetti, PA-C  loratadine (CLARITIN) 10 MG tablet  05/29/17   [provider]  meclizine (ANTIVERT) 25 MG tablet Take 1/2-1 tablet up to 3 times a day as needed for dizziness may calls some drowsiness. 04/26/17   Hayden Rasmussen, NP  Multiple Vitamin (MULTIVITAMIN WITH MINERALS) TABS tablet Take 1 tablet by mouth daily. 10/22/16   Angiulli, Mcarthur Rossetti, PA-C  pantoprazole (PROTONIX) 40 MG tablet TK 1 T PO QD 01/16/19   [provider]  RA ASPIRIN EC 325 MG EC tablet  04/14/17  [provider]  rosuvastatin (CRESTOR) 20 MG tablet Take 1 tablet (20 mg total) by mouth daily. 03/24/17   Gust Rung, DO  triamcinolone cream (KENALOG) 0.5 % APPLY TO AFFECTED AREA TWICE A DAY IF NEEDED 01/04/18   [provider]      Allergies    Tramadol and Topiramate    Review of Systems   Review of Systems  Unable to perform ROS: Mental status change    Physical Exam Updated Vital Signs BP (!) 147/83   Pulse 96   Resp 16   Wt 51.8 kg   SpO2 100%   BMI 18.71 kg/m  Physical Exam Vitals and nursing note reviewed.  Constitutional:      General: She is  not in acute distress.    Appearance: Normal appearance. She is well-developed. She is not ill-appearing, toxic-appearing or diaphoretic.  HENT:     Head: Normocephalic and atraumatic.     Right Ear: External ear normal.     Left Ear: External ear normal.     Nose: Nose normal.     Mouth/Throat:     Mouth: Mucous membranes are moist.  Eyes:     Conjunctiva/sclera: Conjunctivae normal.  Cardiovascular:     Rate and Rhythm: Regular rhythm. Tachycardia present.  Pulmonary:     Effort: Pulmonary effort is normal. No respiratory distress.  Abdominal:     General: There is no distension.  Musculoskeletal:        General: No swelling.     Cervical back: Normal range of motion and neck supple.     Right lower leg: No edema.     Left lower leg: No edema.  Skin:    General: Skin is warm and dry.     Coloration: Skin is not jaundiced or pale.  Neurological:     Mental Status: She is alert and oriented to person, place, and time.     Cranial Nerves: Dysarthria present. No facial asymmetry.     Sensory: Sensation is intact. No sensory deficit.     Motor: Motor function is intact. No pronator drift.     Coordination: Coordination is intact. Finger-Nose-Finger Test normal.  Psychiatric:        Mood and Affect: Mood normal.        Behavior: Behavior normal.     ED Results / Procedures / Treatments   Labs (all labs ordered are listed, but only abnormal results are displayed) Labs Reviewed  COMPREHENSIVE METABOLIC PANEL - Abnormal; Notable for the following components:      Result Value   CO2 19 (*)    Glucose, Bld 121 (*)    All other components within normal limits  I-STAT CHEM 8, ED - Abnormal; Notable for the following components:   Chloride 112 (*)    Glucose, Bld 120 (*)    TCO2 21 (*)    All other components within normal limits  CBG MONITORING, ED - Abnormal; Notable for the following components:   Glucose-Capillary 123 (*)    All other components within normal limits   PROTIME-INR  APTT  CBC  DIFFERENTIAL  ETHANOL    EKG EKG Interpretation  Date/Time:  Tuesday Feb 01 2023 08:43:29 EDT Ventricular Rate:  94 PR Interval:  125 QRS Duration: 83 QT Interval:  329 QTC Calculation: 412 R Axis:   63 Text Interpretation: Sinus rhythm Left ventricular hypertrophy Borderline T abnormalities, inferior leads Minimal ST elevation, lateral leads Confirmed by Gloris Manchester ([id number]) on 02/01/2023 8:52:27  AM  Radiology CT ANGIO HEAD NECK W WO CM (CODE STROKE)  Result Date: 02/01/2023 CLINICAL DATA:  Stroke suspected EXAM: CT ANGIOGRAPHY HEAD AND NECK WITH AND WITHOUT CONTRAST TECHNIQUE: Multidetector CT imaging of the head and neck was performed using the standard protocol during bolus administration of intravenous contrast. Multiplanar CT image reconstructions and MIPs were obtained to evaluate the vascular anatomy. Carotid stenosis measurements (when applicable) are obtained utilizing NASCET criteria, using the distal internal carotid diameter as the denominator. RADIATION DOSE REDUCTION: This exam was performed according to the departmental dose-optimization program which includes automated exposure control, adjustment of the mA and/or kV according to patient size and/or use of iterative reconstruction technique. CONTRAST:  75mL OMNIPAQUE IOHEXOL 350 MG/ML SOLN COMPARISON:  Same day CT head, MR Head 09/03/22, CTA head/neck 10/11/16 FINDINGS: CT HEAD FINDINGS See same day CT brain for intracranial findings. CTA NECK FINDINGS Aortic arch: Aberrant right subclavian artery. Imaged portion shows no evidence of aneurysm or dissection. No significant stenosis of the major arch vessel origins. Right carotid system: No evidence of dissection, stenosis (50% or greater), or occlusion. Left carotid system: No evidence of dissection, stenosis (50% or greater), or occlusion. Vertebral arteries: Codominant. No evidence of dissection, stenosis (50% or greater), or occlusion. Skeleton: At least  moderate spinal canal narrowing at C5-C6. Other neck: Negative. Upper chest: Moderate centrilobular emphysema. Review of the MIP images confirms the above findings CTA HEAD FINDINGS Anterior circulation: No significant stenosis, proximal occlusion, aneurysm, or vascular malformation. Posterior circulation: No significant stenosis, proximal occlusion, aneurysm, or vascular malformation. Venous sinuses: As permitted by contrast timing, patent. Anatomic variants: None Review of the MIP images confirms the above findings IMPRESSION: 1. No intracranial large vessel occlusion or significant stenosis. 2. No hemodynamically significant stenosis in the neck. 3. At least moderate spinal canal narrowing at C5-C6 Emphysema (ICD10-J43.9). Electronically Signed   By: Lorenza Cambridge M.D.   On: 02/01/2023 08:36   CT HEAD CODE STROKE WO CONTRAST  Result Date: 02/01/2023 CLINICAL DATA:  Code stroke.  Limping on the right EXAM: CT HEAD WITHOUT CONTRAST TECHNIQUE: Contiguous axial images were obtained from the base of the skull through the vertex without intravenous contrast. RADIATION DOSE REDUCTION: This exam was performed according to the departmental dose-optimization program which includes automated exposure control, adjustment of the mA and/or kV according to patient size and/or use of iterative reconstruction technique. COMPARISON:  09/03/2022 FINDINGS: Brain: No evidence of acute infarction, hemorrhage, hydrocephalus, extra-axial collection or mass lesion/mass effect. Chronic small vessel ischemia that is underestimated compared to brain MRI Vascular: No hyperdense vessel or unexpected calcification. Skull: Normal. Negative for fracture or focal lesion. Sinuses/Orbits: No acute finding. Other: Prelim sent in epic chat. ASPECTS [facility] Stroke Program Early CT Score) - Ganglionic level infarction (caudate, lentiform nuclei, internal capsule, insula, M1-M3 cortex): 7 - Supraganglionic infarction (M4-M6 cortex): 3 Total score  (0-10 with 10 being normal): 10 IMPRESSION: 1. No acute finding. 2. ASPECTS is 10. Electronically Signed   By: Tiburcio Pea M.D.   On: 02/01/2023 08:18    Procedures Procedures    Medications Ordered in ED Medications  sodium chloride flush (NS) 0.9 % injection 3 mL (has no administration in time range)  iohexol (OMNIPAQUE) 350 MG/ML injection 75 mL (75 mLs Intravenous Contrast Given 02/01/23 0821)  tenecteplase (TNKASE) injection for Stroke 13 mg (13 mg Intravenous Given 02/01/23 1610)    ED Course/ Medical Decision Making/ A&P  Medical Decision Making Amount and/or Complexity of Data Reviewed Labs: ordered. Radiology: ordered.  Risk Decision regarding hospitalization.   This patient presents to the ED for concern of strokelike symptoms, this involves an extensive number of treatment options, and is a complaint that carries with it a high risk of complications and morbidity.  The differential diagnosis includes CVA, TIA, seizure, complex migraine, polypharmacy, recrudescence of prior stroke   Co morbidities that complicate the patient evaluation  anemia, GERD, gout, HTN, arthritis, and prior CVA   Additional history obtained:  Additional history obtained from EMS External records from outside source obtained and reviewed including EMR   Lab Tests:  I Ordered, and personally interpreted labs.  The pertinent results include: Normal glucose, normal hemoglobin, no leukocytosis, normal electrolytes   Imaging Studies ordered:  I ordered imaging studies including CT head, CTA head and neck I independently visualized and interpreted imaging which showed no acute findings I agree with the radiologist interpretation   Cardiac Monitoring: / EKG:  The patient was maintained on a cardiac monitor.  I personally viewed and interpreted the cardiac monitored which showed an underlying rhythm of: Sinus rhythm   Consultations Obtained:  I requested  consultation with the neurologist, Dr. Selina Cooley,  and discussed lab and imaging findings as well as pertinent plan - they recommend: TNKase and admission to neuro ICU   Problem List / ED Course / Critical interventions / Medication management  Patient presenting for acute onset of strokelike symptoms.  Onset was 1 hour prior to arrival.  She arrives as a code stroke.  She was taken directly to CT scanner.  CT scan showed no acute findings.  Given symptoms and timeline, neurology gave TNK and will admit to neuro ICU.  Lab work is unremarkable.  While in the ED, patient had improvement in her dysarthria.  Per family, speech is not back to baseline.  Patient was admitted for further management.   Social Determinants of Health:  Has PCP         Final Clinical Impression(s) / ED Diagnoses Final diagnoses:  Stroke-like symptoms    Rx / DC Orders ED Discharge Orders     None         Gloris Manchester, MD 02/01/23 1707

## 2023-02-02 ENCOUNTER — Inpatient Hospital Stay (HOSPITAL_COMMUNITY): Payer: Medicare (Managed Care)

## 2023-02-02 ENCOUNTER — Other Ambulatory Visit: Payer: Self-pay | Admitting: Orthopaedic Surgery

## 2023-02-02 ENCOUNTER — Other Ambulatory Visit (HOSPITAL_COMMUNITY): Payer: Self-pay

## 2023-02-02 ENCOUNTER — Telehealth (HOSPITAL_COMMUNITY): Payer: Self-pay | Admitting: Pharmacy Technician

## 2023-02-02 DIAGNOSIS — I6300 Cerebral infarction due to thrombosis of unspecified precerebral artery: Secondary | ICD-10-CM

## 2023-02-02 LAB — COMPREHENSIVE METABOLIC PANEL
ALT: 11 U/L (ref 0–44)
AST: 15 U/L (ref 15–41)
Albumin: 3.1 g/dL — ABNORMAL LOW (ref 3.5–5.0)
Alkaline Phosphatase: 87 U/L (ref 38–126)
Anion gap: 7 (ref 5–15)
BUN: 14 mg/dL (ref 8–23)
CO2: 21 mmol/L — ABNORMAL LOW (ref 22–32)
Calcium: 9.3 mg/dL (ref 8.9–10.3)
Chloride: 111 mmol/L (ref 98–111)
Creatinine, Ser: 0.75 mg/dL (ref 0.44–1.00)
GFR, Estimated: >60 mL/min (ref >60)
Glucose, Bld: 96 mg/dL (ref 70–99)
Potassium: 4.1 mmol/L (ref 3.5–5.1)
Sodium: 139 mmol/L (ref 135–145)
Total Bilirubin: 0.3 mg/dL (ref 0.3–1.2)
Total Protein: 6.6 g/dL (ref 6.5–8.1)

## 2023-02-02 LAB — CBC
HCT: 37.8 % (ref 36.0–46.0)
Hemoglobin: 12.2 g/dL (ref 12.0–15.0)
MCH: 31.5 pg (ref 26.0–34.0)
MCHC: 32.3 g/dL (ref 30.0–36.0)
MCV: 97.7 fL (ref 80.0–100.0)
Platelets: 159 10*3/uL (ref 150–400)
RBC: 3.87 MIL/uL (ref 3.87–5.11)
RDW: 13.4 % (ref 11.5–15.5)
WBC: 4.9 10*3/uL (ref 4.0–10.5)
nRBC: 0 % (ref 0.0–0.2)

## 2023-02-02 LAB — LIPID PANEL
Cholesterol: 178 mg/dL (ref 0–200)
HDL: 46 mg/dL (ref >40)
LDL Cholesterol: 113 mg/dL — ABNORMAL HIGH (ref 0–99)
Total CHOL/HDL Ratio: 3.9 RATIO
Triglycerides: 95 mg/dL (ref <150)
VLDL: 19 mg/dL (ref 0–40)

## 2023-02-02 LAB — HEMOGLOBIN A1C
Hgb A1c MFr Bld: 5.8 % — ABNORMAL HIGH (ref 4.8–5.6)
Mean Plasma Glucose: 119.76 mg/dL

## 2023-02-02 MED ORDER — PANTOPRAZOLE SODIUM 40 MG PO TBEC: 40.0000 mg | DELAYED_RELEASE_TABLET | Freq: Every day | ORAL | Status: DC

## 2023-02-02 MED ORDER — CLOPIDOGREL BISULFATE 75 MG PO TABS
75.0000 mg | ORAL_TABLET | Freq: Every day | ORAL | Status: DC
  Administered 2023-02-02: 75 mg via ORAL
  Filled 2023-02-02: qty 1

## 2023-02-02 MED ORDER — ACETAMINOPHEN-CODEINE 300-30 MG PO TABS
1.0000 | ORAL_TABLET | Freq: Once | ORAL | Status: AC
  Administered 2023-02-02: 1 via ORAL
  Filled 2023-02-02: qty 1

## 2023-02-02 NOTE — Evaluation (Signed)
Occupational Therapy Evaluation Patient Details Name: Mercedes Gardner MRN: [id number] DOB: [birth year] Today's Date: 02/02/2023   History of Present Illness 77 y.o. female presents to [facility] on 02/01/2023 with weakness and difficulty speaking. CT negative, pt received TNK. PMH includes OA, GERD, gout, HLD, HTN, CVA.   Clinical Impression   Prior to this admission, patient working at a pre-school, independent in ADLs and IADLs, but did not drive and uses [employer] transportation. Patient demonstrating deficits in her overall cognition but is at her baseline for ADLs. Patient unable to read the clock on the wall correcty (though can see all the numbers), unable to count backwards by 2 starting from 20, able to name animals in alphabetical order starting at B, but got all the way to I and began to go backwards in alphabet. Patient admits this is a difference in her overall cognition. OT recommending Outpatient OT (Neuro focus) in order for patient to get back to her baseline. OT emphasizing importance of taking time before returning to work, with patient in agreement. OT also advising patient to have supervision for medication management and cooking tasks until she returns to her prior level. OT will continue to follow acutely.      Recommendations for follow up therapy are one component of a multi-disciplinary discharge planning process, led by the attending physician.  Recommendations may be updated based on patient status, additional functional criteria and insurance authorization.   Assistance Recommended at Discharge Frequent or constant Supervision/Assistance (initially)  Patient can return home with the following Direct supervision/assist for medications management;Direct supervision/assist for financial management;Assistance with cooking/housework    Functional Status Assessment  Patient has had a recent decline in their functional status and demonstrates the ability to make significant  improvements in function in a reasonable and predictable amount of time.  Equipment Recommendations  None recommended by OT    Recommendations for Other Services       Precautions / Restrictions Precautions Precautions: None Restrictions Weight Bearing Restrictions: No      Mobility Bed Mobility Overal bed mobility: Independent                  Transfers Overall transfer level: Independent Equipment used: None                      Balance Overall balance assessment: Independent                                         ADL either performed or assessed with clinical judgement   ADL Overall ADL's : At baseline                                       General ADL Comments: Patient is at her baseline for ADLs, but demonstrates deficits in her cognition     Vision Baseline Vision/History: 1 Wears glasses (Readers) Ability to See in Adequate Light: 0 Adequate Patient Visual Report: No change from baseline Vision Assessment?: Yes Eye Alignment: Within Functional Limits Ocular Range of Motion: Within Functional Limits Alignment/Gaze Preference: Within Defined Limits Tracking/Visual Pursuits: Able to track stimulus in all quads without difficulty Saccades: Within functional limits Convergence: Within functional limits Visual Fields: No apparent deficits Additional Comments: No visual deficits     Perception  Praxis      Pertinent Vitals/Pain Pain Assessment Pain Assessment: No/denies pain     Hand Dominance Right   Extremity/Trunk Assessment Upper Extremity Assessment Upper Extremity Assessment: Overall WFL for tasks assessed   Lower Extremity Assessment Lower Extremity Assessment: Defer to PT evaluation LLE Deficits / Details: pt reports subjective L weakness, strength is equal between both LE   Cervical / Trunk Assessment Cervical / Trunk Assessment: Normal   Communication Communication Communication: No  difficulties   Cognition Arousal/Alertness: Awake/alert Behavior During Therapy: WFL for tasks assessed/performed Overall Cognitive Status: Impaired/Different from baseline Area of Impairment: Attention, Memory, Following commands, Safety/judgement, Problem solving, Awareness                   Current Attention Level: Selective Memory: Decreased short-term memory Following Commands: Follows multi-step commands inconsistently Safety/Judgement: Decreased awareness of safety, Decreased awareness of deficits Awareness: Emergent Problem Solving: Difficulty sequencing, Requires verbal cues, Decreased initiation, Slow processing General Comments: Patient unable to read the clock on the wall correcty (though can see all the numbers), unable to count backwards by 2 starting from 20, able to name animals in alphabetical order starting at B, but got all the way to I and began to go backwards in alphabet. Patient admits this is a difference in her overall cognition.     General Comments  VSS on RA    Exercises     Shoulder Instructions      Home Living Family/patient expects to be discharged to:: Private residence Living Arrangements: Children Available Help at Discharge: Family;Available PRN/intermittently Type of Home: House Home Access: Stairs to enter [employer] of Steps: 6+6 Entrance Stairs-Rails: Left;Right (wide) Home Layout: One level               Home Equipment: None      Lives With: Family;Daughter    Prior Functioning/Environment Prior Level of Function : Independent/Modified Independent;Working/employed               ADLs Comments: uses [employer] transportation        OT Problem List: Decreased cognition;Decreased safety awareness      OT Treatment/Interventions: Cognitive remediation/compensation    OT Goals(Current goals can be found in the care plan section) Acute Rehab OT Goals Patient Stated Goal: to get back to work OT Goal  Formulation: With patient Time For Goal Achievement: 02/16/23 Potential to Achieve Goals: Good  OT Frequency: Min 2X/week    Co-evaluation              AM-PAC OT "6 Clicks" Daily Activity     Outcome Measure Help from another person eating meals?: None Help from another person taking care of personal grooming?: None Help from another person toileting, which includes using toliet, bedpan, or urinal?: None Help from another person bathing (including washing, rinsing, drying)?: None Help from another person to put on and taking off regular upper body clothing?: None Help from another person to put on and taking off regular lower body clothing?: None 6 Click Score: 24   End of Session Nurse Communication: Mobility status  Activity Tolerance: Patient tolerated treatment well Patient left: in bed;with call bell/phone within reach;with family/visitor present  OT Visit Diagnosis: Other symptoms and signs involving cognitive function                Time: 4098-1191 OT Time Calculation (min): 12 min Charges:  OT General Charges $OT Visit: 1 Visit OT Evaluation $OT Eval Moderate Complexity: 1 Mod  [facility]  Gabriel Rung Arnie Clingenpeel, OTR/L Acute Rehabilitation Services [phone]   Cherlyn Cushing 02/02/2023, 1:55 PM

## 2023-02-02 NOTE — Telephone Encounter (Signed)
Pharmacy Patient Advocate Encounter  Insurance verification completed.    The patient is insured through [employer] Part D   The patient is currently admitted and ran test claims for the following: Brilinta.  Copays and coinsurance results were relayed to Inpatient clinical team.

## 2023-02-02 NOTE — TOC Benefit Eligibility Note (Signed)
Patient Product/process development scientist completed.    The patient is currently admitted and upon discharge could be taking Brilinta 90 mg.  The current 30 day co-pay is $0.00.   The patient is insured through [employer] Part D   This test claim was processed through [facility]- copay amounts may vary at other pharmacies due to pharmacy/plan contracts, or as the patient moves through the different stages of their insurance plan.  Roland Earl, CPHT Pharmacy Patient Advocate Specialist [facility] Patient Advocate Team Direct Number: [phone]  Fax: [phone]

## 2023-02-02 NOTE — Evaluation (Signed)
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   Unilateral primary osteoarthritis, right knee 03/05/2019   Tuberculosis screening 06/08/2017   Generalized anxiety disorder 03/24/2017   Eczema of both upper extremities 03/24/2017   Ventral hernia without obstruction or gangrene 03/23/2017   Healthcare maintenance 12/07/2016   Incidental lung nodule, greater than or equal to 8mm    History of stroke    Normocytic anemia 10/11/2016   Personal history of gout 12/24/2008   ALLERGIC RHINITIS 12/20/2008   Unilateral primary osteoarthritis, left knee 12/20/2008   TOBACCO ABUSE 07/29/2006   Essential hypertension 07/29/2006   Past Medical History:  Past Medical History:  Diagnosis Date   Anemia    Anxiety    Arthritis    knees    Chronic vulvovaginitis    Degenerative joint disease (DJD) of lumbar spine    Dysrhythmia    heart skips a beat    Eczema    GERD (gastroesophageal reflux disease)    Gout    left knee   Hyperlipidemia April 2014   Hypertension    Osteoarthritis of left knee    Ovarian cyst    removed during mini lap 2016, benign path, Dr Andrey Farmer   Shortness of breath dyspnea    due to pressure of cyst per patient    Stroke [facility]) 10/2016   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)    TIA (transient ischemic attack)    Tinea corporis 2017   Past Surgical History:  Past Surgical History:  Procedure Laterality Date   CESAREAN SECTION     x3   IR GENERIC HISTORICAL  11/30/2016   IR ANGIO VERTEBRAL SEL VERTEBRAL UNI L MOD SED 11/30/2016 Julieanne Cotton, MD [facility]   IR GENERIC HISTORICAL  11/30/2016   IR ANGIO INTRA EXTRACRAN SEL COM CAROTID INNOMINATE BILAT MOD SED 11/30/2016 Julieanne Cotton, MD  [facility]   IR GENERIC HISTORICAL  11/30/2016   IR ANGIO VERTEBRAL SEL SUBCLAVIAN INNOMINATE UNI R MOD SED 11/30/2016 Julieanne Cotton, MD [facility]   OVARIAN CYST SURGERY Right 1980's   SALPINGOOPHORECTOMY Bilateral 02/06/2015   Procedure: Benay Pillow LAPAROTOMY/BILATERAL SALPINGO OOPHORECTOMY;  Surgeon: Adolphus Birchwood, MD;  Location: [facility] OR[facility];  Service: Gynecology;  Laterality: Bilateral;   HPI:  77 y.o. female presents to [facility] hospital on 02/01/2023 with weakness and difficulty speaking. CT and MRI negative. Aphasia resolved after TNK. OT noticed cognitive impairment. PMH includes OA, GERD, gout, HLD, HTN, CVA.   Assessment / Plan / Recommendation Clinical Impression  Pt demonstrates language and speech WNL, resolved since admission. Under scrutiny pt observed to have some difficulty with complex verbal tasks with OT, getting lost in a sequence. SLP spent some time discussing pts routines and higher level ADL's. Pt agreed to participate in a money counting task. Pt able to sort coins and bills, make a note of the totals. When asked to add the $40 to $1.59 cents she counted, pt was unable to do so on paper without significant errors (initially added 40+59 and told me there were $99 and could not recognize the error or fix it without assist). Pt then verbalized she did feel different from her baseline and felt that was a task she should be able to do. Pt particiapted in verbal reasoning  about her current plan to immediately return to work. Recommended f/u with OP SLP but also at least a full week at home to rest and manage her personal ADL's prior attempting a return to work. She is very concerned about financial responsibilities.    SLP Assessment  SLP Recommendation/Assessment: All further Speech Lanaguage Pathology  needs can be addressed in the next venue of care SLP Visit Diagnosis: Cognitive communication deficit (R41.841)    Recommendations for follow up therapy are one component of a  multi-disciplinary discharge planning process, led by the attending physician.  Recommendations may be updated based on patient status, additional functional criteria and insurance authorization.    Follow Up Recommendations  Outpatient SLP    Assistance Recommended at Discharge     Functional Status Assessment    Frequency and Duration           SLP Evaluation Cognition  Overall Cognitive Status: Impaired/Different from baseline Arousal/Alertness: Awake/alert Orientation Level: Oriented X4 Attention: Selective;Alternating Selective Attention: Appears intact Alternating Attention: Appears intact Memory: Appears intact Awareness: Impaired Awareness Impairment: Anticipatory impairment Executive Function: Reasoning;Organizing;Self Monitoring;Self Correcting Reasoning: Impaired Reasoning Impairment: Functional basic Organizing: Impaired Organizing Impairment: Functional basic Self Monitoring: Impaired Self Monitoring Impairment: Functional basic       Comprehension  Auditory Comprehension Overall Auditory Comprehension: Appears within functional limits for tasks assessed Reading Comprehension Reading Status: Within funtional limits    Expression Verbal Expression Overall Verbal Expression: Appears within functional limits for tasks assessed Written Expression Dominant Hand: Right   Oral / Motor  Oral Motor/Sensory Function Overall Oral Motor/Sensory Function: Within functional limits Motor Speech Overall Motor Speech: Appears within functional limits for tasks assessed            Amarion Portell, Riley Nearing 02/02/2023, 2:20 PM

## 2023-02-02 NOTE — TOC Transition Note (Addendum)
Transition of Care [facility]) - CM/SW Discharge Note   Patient Details  Name: Mercedes Gardner MRN: [id number] Date of Birth: Apr 01, 1946  [facility]) CM/SW Contact:  Glennon Mac, RN Phone Number: 02/02/2023, 2:25 PM   Clinical Narrative:    77 y.o. female presents to [facility] hospital on 02/01/2023 with weakness and difficulty speaking. CT negative, pt received TNK.  PTA, patient independent and living at home with daughter; family able to provide intermittent assistance at dc.  OT and ST recommending OP therapies; will make referral to [facility] for follow up.   Per nurse Fleet Contras, patient having difficulty finding transportation home, but is working on it.  Will provide taxi voucher if unable to secure a ride home.    Addendum: 3:55pm Taxi voucher provided, as patient unable to find transportation home.    Final next level of care: OP Rehab Barriers to Discharge: Barriers Resolved                         Discharge Plan and Services Additional resources added to the After Visit Summary for     Discharge Planning Services: CM Consult                                 Social Determinants of Health (SDOH) Interventions SDOH Screenings   Tobacco Use: High Risk (02/01/2023)     Readmission Risk Interventions     No data to display         Quintella Baton, RN, BSN  Trauma/Neuro ICU Case Manager [phone]

## 2023-02-02 NOTE — Evaluation (Signed)
Physical Therapy Evaluation Patient Details Name: Mercedes Gardner MRN: [id number] DOB: Jan 16, 1946 Today's Date: 02/02/2023  History of Present Illness  77 y.o. female presents to [facility] hospital on 02/01/2023 with weakness and difficulty speaking. CT negative, pt received TNK. PMH includes OA, GERD, gout, HLD, HTN, CVA.  Clinical Impression  Pt presents to PT mobilizing well. Pt is able to ambulate and negotiate stairs at a modI level. Pt reports subjective LLE weakness but strength appears equal bilaterally with MMT. PT anticipates these feelings of weakness will improve with further opportunity to mobilize. Pt appears to have no further acute PT needs at this time. PT signing off.       Recommendations for follow up therapy are one component of a multi-disciplinary discharge planning process, led by the attending physician.  Recommendations may be updated based on patient status, additional functional criteria and insurance authorization.  Follow Up Recommendations       Assistance Recommended at Discharge None  Patient can return home with the following       Equipment Recommendations None recommended by PT  Recommendations for Other Services       Functional Status Assessment Patient has not had a recent decline in their functional status     Precautions / Restrictions Precautions Precautions: None Restrictions Weight Bearing Restrictions: No      Mobility  Bed Mobility Overal bed mobility: Independent                  Transfers Overall transfer level: Independent Equipment used: None                    Ambulation/Gait Ambulation/Gait assistance: Modified independent (Device/Increase time) Gait Distance (Feet): 300 Feet Assistive device: None Gait Pattern/deviations: Step-through pattern Gait velocity: functional Gait velocity interpretation: >2.62 ft/sec, indicative of community ambulatory   General Gait Details: mild lateral drift initially,  improves  with time  Stairs Stairs: Yes Stairs assistance: Modified independent (Device/Increase time) Stair Management: One rail Left, Step to pattern Number of Stairs: 6    Wheelchair Mobility    Modified Rankin (Stroke Patients Only) Modified Rankin (Stroke Patients Only) Pre-Morbid Rankin Score: No symptoms Modified Rankin: No symptoms     Balance Overall balance assessment: Independent                                           Pertinent Vitals/Pain Pain Assessment Pain Assessment: No/denies pain    Home Living Family/patient expects to be discharged to:: Private residence Living Arrangements: Children Available Help at Discharge: Family;Available PRN/intermittently Type of Home: House Home Access: Stairs to enter Entrance Stairs-Rails: Left;Right (wide) Entrance Stairs-Number of Steps: 6+6   Home Layout: One level Home Equipment: None      Prior Function Prior Level of Function : Independent/Modified Independent;Working/employed                     Hand Dominance        Extremity/Trunk Assessment   Upper Extremity Assessment Upper Extremity Assessment: Overall WFL for tasks assessed    Lower Extremity Assessment Lower Extremity Assessment: Overall WFL for tasks assessed LLE Deficits / Details: pt reports subjective L weakness, strength is equal between both LE    Cervical / Trunk Assessment Cervical / Trunk Assessment: Normal  Communication   Communication: No difficulties  Cognition Arousal/Alertness: Awake/alert Behavior During Therapy: WFL for  tasks assessed/performed Overall Cognitive Status: Within Functional Limits for tasks assessed                                          General Comments General comments (skin integrity, edema, etc.): VSS on RA    Exercises     Assessment/Plan    PT Assessment Patient does not need any further PT services  PT Problem List         PT Treatment  Interventions      PT Goals (Current goals can be found in the Care Plan section)  Additional Goals Additional Goal #1: Pt will score >19/24 on the DGI to indicate a reduced risk for falls Additional Goal #2: Pt will score >45/56 on the BERG to indicate a reduced risk for falls    Frequency       Co-evaluation               AM-PAC PT "6 Clicks" Mobility  Outcome Measure Help needed turning from your back to your side while in a flat bed without using bedrails?: None Help needed moving from lying on your back to sitting on the side of a flat bed without using bedrails?: None Help needed moving to and from a bed to a chair (including a wheelchair)?: None Help needed standing up from a chair using your arms (e.g., wheelchair or bedside chair)?: None Help needed to walk in hospital room?: None Help needed climbing 3-5 steps with a railing? : None 6 Click Score: 24    End of Session   Activity Tolerance: Patient tolerated treatment well Patient left: in bed;with call bell/phone within reach;with family/visitor present Nurse Communication: Mobility status PT Visit Diagnosis: Other abnormalities of gait and mobility (R26.89)    Time: 1610-9604 PT Time Calculation (min) (ACUTE ONLY): 19 min   Charges:   PT Evaluation $PT Eval Low Complexity: 1 Low          Arlyss Gandy, PT, DPT Acute Rehabilitation Office [phone]   Arlyss Gandy 02/02/2023, 10:21 AM

## 2023-02-02 NOTE — Progress Notes (Signed)
OT Cancellation Note  Patient Details Name: NAELA RICHMAN MRN: [id number] DOB: 04-Dec-1945   Cancelled Treatment:    Reason Eval/Treat Not Completed: Active bedrest order Patient with bed rest order after receiving TNK till 8:51. OT will follow back to complete evaluation once bed rest is lifted.   Pollyann Glen E. Morty Ortwein, OTR/L Acute Rehabilitation Services [phone]   Cherlyn Cushing 02/02/2023, 8:08 AM

## 2023-02-02 NOTE — Progress Notes (Signed)
Discharge paperwork given to pt with daughter at bedside, no questions when paperwork discussed. Pt assisted to taxi at [employer] entrance via wheelchair.
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
## 2023-02-03 ENCOUNTER — Other Ambulatory Visit: Payer: Self-pay | Admitting: Physician Assistant

## 2023-02-04 ENCOUNTER — Ambulatory Visit (INDEPENDENT_AMBULATORY_CARE_PROVIDER_SITE_OTHER): Payer: Medicare (Managed Care) | Admitting: Physician Assistant

## 2023-02-04 ENCOUNTER — Ambulatory Visit: Payer: Medicare (Managed Care) | Admitting: Physician Assistant

## 2023-02-04 ENCOUNTER — Encounter: Payer: Self-pay | Admitting: Physician Assistant

## 2023-02-04 DIAGNOSIS — M1711 Unilateral primary osteoarthritis, right knee: Secondary | ICD-10-CM | POA: Diagnosis not present

## 2023-02-04 DIAGNOSIS — M170 Bilateral primary osteoarthritis of knee: Secondary | ICD-10-CM | POA: Diagnosis not present

## 2023-02-04 DIAGNOSIS — M1712 Unilateral primary osteoarthritis, left knee: Secondary | ICD-10-CM

## 2023-02-04 MED ORDER — METHYLPREDNISOLONE ACETATE 40 MG/ML IJ SUSP
40.0000 mg | INTRAMUSCULAR | Status: AC | PRN
Start: 2023-02-04 — End: 2023-02-04
  Administered 2023-02-04: 40 mg via INTRA_ARTICULAR

## 2023-02-04 MED ORDER — LIDOCAINE HCL 1 % IJ SOLN
3.0000 mL | INTRAMUSCULAR | Status: AC | PRN
Start: 2023-02-04 — End: 2023-02-04
  Administered 2023-02-04: 3 mL

## 2023-02-04 NOTE — Progress Notes (Signed)
Office Visit Note   Patient: Mercedes Gardner           Date of Birth: July 22, 1946           MRN: [id number] Visit Date: 02/04/2023              Requested by: Fleet Contras, MD [address] [place],  [place] [address] PCP: Fleet Contras, MD  Chief Complaint  Patient presents with  . Right Knee - Follow-up  . Left Knee - Follow-up      HPI: Mercedes Gardner is a pleasant 77 year old woman who has a history of varus arthritis on both of her knees.  She periodically manages this with steroid injections.  Last injection was 3 months ago comes in today requesting an injection no history of injury rates her pain without the injection is moderate  Assessment & Plan: Visit Diagnoses:  1. Unilateral primary osteoarthritis, right knee   2. Unilateral primary osteoarthritis, left knee     Plan: Went forward with bilateral knee injections today without any difficulty may follow-up as needed  Follow-Up Instructions: Return if symptoms worsen or fail to improve.   Ortho Exam  Patient is alert, oriented, no adenopathy, well-dressed, normal affect, normal respiratory effort. Bilateral knees no effusion no erythema compartments are soft and nontender she does have obviously varus alignment neurovascular intact  Imaging: No results found. No images are attached to the encounter.  Labs: Lab Results  Component Value Date   HGBA1C 5.8 (H) 02/02/2023   HGBA1C 5.4 10/12/2016   ESRSEDRATE 11 01/10/2020   LABURIC 5.3 02/18/2021   LABURIC 5.1 01/10/2020   LABURIC 4.9 10/11/2016   REPTSTATUS 10/25/2017 FINAL 10/23/2017   CULT 40,000 COLONIES/mL ESCHERICHIA COLI (A) 10/23/2017   LABORGA ESCHERICHIA COLI (A) 10/23/2017     Lab Results  Component Value Date   ALBUMIN 3.1 (L) 02/02/2023   ALBUMIN 3.5 02/01/2023   ALBUMIN 3.3 (L) 09/03/2022    Lab Results  Component Value Date   MG 2.2 10/11/2016   No results found for: "VD25OH"  No results found for: "PREALBUMIN"    Latest Ref Rng &  Units 02/02/2023    6:18 AM 02/01/2023    8:07 AM 02/01/2023    8:03 AM  CBC EXTENDED  WBC 4.0 - 10.5 K/uL 4.9   5.5   RBC 3.87 - 5.11 MIL/uL 3.87   3.93   Hemoglobin 12.0 - 15.0 g/dL 19.1  47.8  29.5   HCT 36.0 - 46.0 % 37.8  38.0  38.8   Platelets 150 - 400 K/uL 159   168   NEUT# 1.7 - 7.7 K/uL   4.1   Lymph# 0.7 - 4.0 K/uL   0.9      There is no height or weight on file to calculate BMI.  Orders:  No orders of the defined types were placed in this encounter.  No orders of the defined types were placed in this encounter.    Procedures: Large Joint Inj: bilateral knee on 02/04/2023 9:37 AM Indications: pain and diagnostic evaluation Details: 25 G 1.5 in needle, anterolateral approach  Arthrogram: No  Medications (Right): 3 mL lidocaine 1 %; 40 mg methylPREDNISolone acetate 40 MG/ML Medications (Left): 3 mL lidocaine 1 %; 40 mg methylPREDNISolone acetate 40 MG/ML Outcome: tolerated well, no immediate complications Procedure, treatment alternatives, risks and benefits explained, specific risks discussed. Consent was given by the patient. Immediately prior to procedure a time out was called to verify the correct patient, procedure, equipment,  support staff and site/side marked as required.    Clinical Data: No additional findings.  ROS:  All other systems negative, except as noted in the HPI. Review of Systems  Objective: Vital Signs: There were no vitals taken for this visit.  Specialty Comments:  No specialty comments available.  PMFS History: Patient Active Problem List   Diagnosis Date Noted  . 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 Unilateral primary osteoarthritis, right knee 03/05/2019  . Tuberculosis screening 06/08/2017  . Generalized anxiety disorder 03/24/2017  . Eczema of both upper extremities 03/24/2017  . Ventral hernia without obstruction or gangrene 03/23/2017  . Healthcare maintenance 12/07/2016  . Incidental lung nodule, greater than or equal to  8mm   . History of stroke   . Normocytic anemia 10/11/2016  . Personal history of gout 12/24/2008  . ALLERGIC RHINITIS 12/20/2008  . Unilateral primary osteoarthritis, left knee 12/20/2008  . TOBACCO ABUSE 07/29/2006  . Essential hypertension 07/29/2006   Past Medical History:  Diagnosis Date  . Anemia   . Anxiety   . Arthritis    knees   . Chronic vulvovaginitis   . Degenerative joint disease (DJD) of lumbar spine   . Dysrhythmia    heart skips a beat   . Eczema   . GERD (gastroesophageal reflux disease)   . Gout    left knee  . Hyperlipidemia April 2014  . Hypertension   . Osteoarthritis of left knee   . Ovarian cyst    removed during mini lap 2016, benign path, Dr Andrey Farmer  . Shortness of breath dyspnea    due to pressure of cyst per patient   . Stroke (HCC) 10/2016  . 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 TIA (transient ischemic attack)   . Tinea corporis 2017    Family History  Problem Relation Age of Onset  . Hypertension Mother   . Diabetes Mother   . Breast cancer Mother   . Diabetes Sister   . Hypertension Sister   . Other Father        accident    Past Surgical History:  Procedure Laterality Date  . CESAREAN SECTION     x3  . IR GENERIC HISTORICAL  11/30/2016   IR ANGIO VERTEBRAL SEL VERTEBRAL UNI L MOD SED 11/30/2016 Julieanne Cotton, MD [facility]  . IR GENERIC HISTORICAL  11/30/2016   IR ANGIO INTRA EXTRACRAN SEL COM CAROTID INNOMINATE BILAT MOD SED 11/30/2016 Julieanne Cotton, MD [facility]  . IR GENERIC HISTORICAL  11/30/2016   IR ANGIO VERTEBRAL SEL SUBCLAVIAN INNOMINATE UNI R MOD SED 11/30/2016 Julieanne Cotton, MD [facility]  . OVARIAN CYST SURGERY Right 1980's  . SALPINGOOPHORECTOMY Bilateral 02/06/2015   Procedure: Benay Pillow LAPAROTOMY/BILATERAL SALPINGO OOPHORECTOMY;  Surgeon: Adolphus Birchwood, MD;  Location: [facility];  Service: Gynecology;  Laterality: Bilateral;   Social History   Occupational History  . Occupation:  Runner, broadcasting/film/video  Tobacco Use  . Smoking status: Every Day    Packs/day: .02    Types: Cigarettes  . Smokeless tobacco: Never  Vaping Use  . Vaping Use: Never used  Substance and Sexual Activity  . Alcohol use: No    Comment: Quit 10/2016  . Drug use: No  . Sexual activity: Never

## 2023-05-02 ENCOUNTER — Other Ambulatory Visit: Payer: Self-pay | Admitting: Internal Medicine

## 2023-05-04 LAB — LIPID PANEL
Cholesterol: 162 mg/dL (ref <200)
HDL: 47 mg/dL — ABNORMAL LOW (ref >=50)
LDL Cholesterol (Calc): 94 mg/dL
Non-HDL Cholesterol (Calc): 115 mg/dL (ref <130)
Total CHOL/HDL Ratio: 3.4 (calc) (ref <5.0)
Triglycerides: 110 mg/dL (ref <150)

## 2023-05-04 LAB — COMPLETE METABOLIC PANEL WITH GFR
AG Ratio: 1.2 (calc) (ref 1.0–2.5)
ALT: 7 U/L (ref 6–29)
AST: 16 U/L (ref 10–35)
Albumin: 4 g/dL (ref 3.6–5.1)
Alkaline phosphatase (APISO): 85 U/L (ref 37–153)
BUN: 15 mg/dL (ref 7–25)
CO2: 16 mmol/L — ABNORMAL LOW (ref 20–32)
Calcium: 9.4 mg/dL (ref 8.6–10.4)
Chloride: 114 mmol/L — ABNORMAL HIGH (ref 98–110)
Creat: 0.98 mg/dL (ref 0.60–1.00)
Globulin: 3.3 g/dL (ref 1.9–3.7)
Glucose, Bld: 84 mg/dL (ref 65–99)
Potassium: 3.8 mmol/L (ref 3.5–5.3)
Sodium: 141 mmol/L (ref 135–146)
Total Bilirubin: 0.2 mg/dL (ref 0.2–1.2)
Total Protein: 7.3 g/dL (ref 6.1–8.1)
eGFR: 61 mL/min/{1.73_m2} (ref >=60)

## 2023-05-04 LAB — CBC
HCT: 33 % — ABNORMAL LOW (ref 35.0–45.0)
Hemoglobin: 10.9 g/dL — ABNORMAL LOW (ref 11.7–15.5)
MCH: 32.3 pg (ref 27.0–33.0)
MCHC: 33 g/dL (ref 32.0–36.0)
MCV: 97.9 fL (ref 80.0–100.0)
MPV: 12 fL (ref 7.5–12.5)
Platelets: 184 10*3/uL (ref 140–400)
RBC: 3.37 10*6/uL — ABNORMAL LOW (ref 3.80–5.10)
RDW: 11 % (ref 11.0–15.0)
WBC: 7.1 10*3/uL (ref 3.8–10.8)

## 2023-05-04 LAB — TSH: TSH: 1.09 m[IU]/L (ref 0.40–4.50)

## 2023-05-04 LAB — B12 AND FOLATE PANEL
Folate: 10.4 ng/mL
Vitamin B-12: 533 pg/mL (ref 200–1100)

## 2023-05-04 LAB — URIC ACID: Uric Acid, Serum: 6.1 mg/dL (ref 2.5–7.0)

## 2023-05-04 LAB — IRON,TIBC AND FERRITIN PANEL
%SAT: 16 % (ref 16–45)
Ferritin: 62 ng/mL (ref 16–288)
Iron: 43 ug/dL — ABNORMAL LOW (ref 45–160)
TIBC: 266 ug/dL (ref 250–450)

## 2023-05-09 ENCOUNTER — Ambulatory Visit (INDEPENDENT_AMBULATORY_CARE_PROVIDER_SITE_OTHER): Payer: Medicare (Managed Care) | Admitting: Physician Assistant

## 2023-05-09 ENCOUNTER — Encounter: Payer: Self-pay | Admitting: Physician Assistant

## 2023-05-09 DIAGNOSIS — M170 Bilateral primary osteoarthritis of knee: Secondary | ICD-10-CM | POA: Diagnosis not present

## 2023-05-09 DIAGNOSIS — M1712 Unilateral primary osteoarthritis, left knee: Secondary | ICD-10-CM

## 2023-05-09 DIAGNOSIS — M1711 Unilateral primary osteoarthritis, right knee: Secondary | ICD-10-CM | POA: Diagnosis not present

## 2023-05-09 MED ORDER — METHYLPREDNISOLONE ACETATE 40 MG/ML IJ SUSP
40.0000 mg | INTRAMUSCULAR | Status: AC | PRN
Start: 2023-05-09 — End: 2023-05-09
  Administered 2023-05-09: 40 mg via INTRA_ARTICULAR

## 2023-05-09 MED ORDER — BUPIVACAINE HCL 0.25 % IJ SOLN
2.0000 mL | INTRAMUSCULAR | Status: AC | PRN
Start: 2023-05-09 — End: 2023-05-09
  Administered 2023-05-09: 2 mL via INTRA_ARTICULAR

## 2023-05-09 MED ORDER — LIDOCAINE HCL 1 % IJ SOLN
2.0000 mL | INTRAMUSCULAR | Status: AC | PRN
Start: 2023-05-09 — End: 2023-05-09
  Administered 2023-05-09: 2 mL

## 2023-05-09 NOTE — Progress Notes (Signed)
Office Visit Note   Patient: Mercedes Gardner           Date of Birth: 1946/05/29           MRN: [id number] Visit Date: 05/09/2023              Requested by: Fleet Contras, MD [address] [place],  [place] [address] PCP: Fleet Contras, MD  Chief Complaint  Patient presents with  . Right Knee - Pain    Requesting cortisone injection - last had 3 months ago - just started wearing off recently  . Left Knee - Pain    Requesting cortisone injection - last had 3 months ago - just started wearing off recently       HPI: Pleasant 77 year old woman presents for bilateral steroid injections into her knees. Has had these in the past and done well. No new injury. Rates her pain as moderate  Assessment & Plan: Visit Diagnoses: Osteoarthritis bilateral knees  Plan: Went forward with injections today without difficulty. May follow up as needed  Follow-Up Instructions: No follow-ups on file.   Ortho Exam  Patient is alert, oriented, no adenopathy, well-dressed, normal affect, normal respiratory effort. Bilateral knees no swelling, effusion or erythema. Bilateral varus  Imaging: No results found. No images are attached to the encounter.  Labs: Lab Results  Component Value Date   HGBA1C 5.8 (H) 02/02/2023   HGBA1C 5.4 10/12/2016   ESRSEDRATE 11 01/10/2020   LABURIC 5.3 02/18/2021   LABURIC 5.1 01/10/2020   LABURIC 4.9 10/11/2016   REPTSTATUS 10/25/2017 FINAL 10/23/2017   CULT 40,000 COLONIES/mL ESCHERICHIA COLI (A) 10/23/2017   LABORGA ESCHERICHIA COLI (A) 10/23/2017     Lab Results  Component Value Date   ALBUMIN 3.1 (L) 02/02/2023   ALBUMIN 3.5 02/01/2023   ALBUMIN 3.3 (L) 09/03/2022    Lab Results  Component Value Date   MG 2.2 10/11/2016   No results found for: "VD25OH"  No results found for: "PREALBUMIN"    Latest Ref Rng & Units 02/02/2023    6:18 AM 02/01/2023    8:07 AM 02/01/2023    8:03 AM  CBC EXTENDED  WBC 4.0 - 10.5 K/uL 4.9   5.5   RBC 3.87 -  5.11 MIL/uL 3.87   3.93   Hemoglobin 12.0 - 15.0 g/dL 24.4  01.0  27.2   HCT 36.0 - 46.0 % 37.8  38.0  38.8   Platelets 150 - 400 K/uL 159   168   NEUT# 1.7 - 7.7 K/uL   4.1   Lymph# 0.7 - 4.0 K/uL   0.9      There is no height or weight on file to calculate BMI.  Orders:  No orders of the defined types were placed in this encounter.  No orders of the defined types were placed in this encounter.    Procedures: Large Joint Inj: bilateral knee on 05/09/2023 8:38 AM Indications: pain and diagnostic evaluation Details: 25 G 1.5 in needle, anteromedial approach  Arthrogram: No  Medications (Right): 2 mL lidocaine 1 %; 2 mL bupivacaine 0.25 %; 40 mg methylPREDNISolone acetate 40 MG/ML Medications (Left): 2 mL lidocaine 1 %; 2 mL bupivacaine 0.25 %; 40 mg methylPREDNISolone acetate 40 MG/ML Outcome: tolerated well, no immediate complications Procedure, treatment alternatives, risks and benefits explained, specific risks discussed. Consent was given by the patient.    Clinical Data: No additional findings.  ROS:  All other systems negative, except as noted in the HPI.  Review of Systems  Objective: Vital Signs: There were no vitals taken for this visit.  Specialty Comments:  No specialty comments available.  PMFS History: Patient Active Problem List   Diagnosis Date Noted  . 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 Unilateral primary osteoarthritis, right knee 03/05/2019  . Tuberculosis screening 06/08/2017  . Generalized anxiety disorder 03/24/2017  . Eczema of both upper extremities 03/24/2017  . Ventral hernia without obstruction or gangrene 03/23/2017  . Healthcare maintenance 12/07/2016  . Incidental lung nodule, greater than or equal to 8mm   . History of stroke   . Normocytic anemia 10/11/2016  . Personal history of gout 12/24/2008  . ALLERGIC RHINITIS 12/20/2008  . Unilateral primary osteoarthritis, left knee 12/20/2008  . TOBACCO ABUSE 07/29/2006  . Essential  hypertension 07/29/2006   Past Medical History:  Diagnosis Date  . Anemia   . Anxiety   . Arthritis    knees   . Chronic vulvovaginitis   . Degenerative joint disease (DJD) of lumbar spine   . Dysrhythmia    heart skips a beat   . Eczema   . GERD (gastroesophageal reflux disease)   . Gout    left knee  . Hyperlipidemia April 2014  . Hypertension   . Osteoarthritis of left knee   . Ovarian cyst    removed during mini lap 2016, benign path, Dr Andrey Farmer  . Shortness of breath dyspnea    due to pressure of cyst per patient   . Stroke (HCC) 10/2016  . 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 TIA (transient ischemic attack)   . Tinea corporis 2017    Family History  Problem Relation Age of Onset  . Hypertension Mother   . Diabetes Mother   . Breast cancer Mother   . Diabetes Sister   . Hypertension Sister   . Other Father        accident    Past Surgical History:  Procedure Laterality Date  . CESAREAN SECTION     x3  . IR GENERIC HISTORICAL  11/30/2016   IR ANGIO VERTEBRAL SEL VERTEBRAL UNI L MOD SED 11/30/2016 Julieanne Cotton, MD [facility]  . IR GENERIC HISTORICAL  11/30/2016   IR ANGIO INTRA EXTRACRAN SEL COM CAROTID INNOMINATE BILAT MOD SED 11/30/2016 Julie[facility] Cotton, MD [facility]  . IR GENERIC HISTORICAL  11/30/2016   IR ANGIO VERTEBRAL SEL SUBCLAVIAN INNOMINATE UNI R MOD SED 11/30/2016 Julie[facility] Cotton, MD [facility]  . OVARIAN CYST SURGERY Right 1980's  . SALPINGOOPHORECTOMY Bilateral 02/06/2015   Procedure: Benay Pillow LAPAROTOMY/BILATERAL SALPINGO OOPHORECTOMY;  Surgeon: Adolphus Birchwood, MD;  Location: [facility];  Service: Gynecology;  Laterality: Bilateral;   Social History   Occupational History  . Occupation: Runner, broadcasting/film/video  Tobacco Use  . Smoking status: Every Day    Current packs/day: 0.02    Types: Cigarettes  . Smokeless tobacco: Never  Vaping Use  . Vaping status: Never Used  Substance and Sexual Activity  . Alcohol use: No    Comment:  Quit 10/2016  . Drug use: No  . Sexual activity: Never

## 2023-05-12 ENCOUNTER — Other Ambulatory Visit: Payer: Self-pay

## 2023-05-12 NOTE — Patient Outreach (Signed)
First telephone outreach attempt to obtain mRS. No answer. Left message for returned call.  Philmore Pali [facility] Management Assistant [phone]

## 2023-05-13 ENCOUNTER — Other Ambulatory Visit: Payer: Self-pay | Admitting: Physician Assistant

## 2023-05-17 ENCOUNTER — Other Ambulatory Visit: Payer: Self-pay

## 2023-05-17 NOTE — Patient Outreach (Signed)
Second telephone outreach attempt to obtain mRS. Pt answered but asked that I giver her a call back later today.  Vanice Sarah [facility] Management Assistant [phone]

## 2023-05-18 ENCOUNTER — Other Ambulatory Visit: Payer: Self-pay

## 2023-05-18 NOTE — Patient Outreach (Signed)
3 outreach attempts were completed to obtain mRs. mRs could not be obtained because patient never returned my calls. mRs=7    [place] Management Assistant [phone]

## 2023-06-03 ENCOUNTER — Other Ambulatory Visit: Payer: Self-pay | Admitting: Orthopaedic Surgery

## 2023-07-04 ENCOUNTER — Other Ambulatory Visit: Payer: Self-pay

## 2023-07-04 ENCOUNTER — Encounter (HOSPITAL_COMMUNITY): Payer: Self-pay

## 2023-07-04 ENCOUNTER — Emergency Department (HOSPITAL_COMMUNITY)
Admission: EM | Admit: 2023-07-04 | Discharge: 2023-07-05 | Discharge status: Against Medical Advice | Payer: Medicare (Managed Care) | Attending: Emergency Medicine | Admitting: Emergency Medicine

## 2023-07-04 DIAGNOSIS — H9203 Otalgia, bilateral: Secondary | ICD-10-CM | POA: Insufficient documentation

## 2023-07-04 DIAGNOSIS — Z5321 Procedure and treatment not carried out due to patient leaving prior to being seen by health care provider: Secondary | ICD-10-CM | POA: Insufficient documentation

## 2023-07-04 NOTE — ED Triage Notes (Signed)
Pt to ED by EMS from home with c/o bilateral ear pain and a URI. Pt endorses putting a bobby pin in both ears in hopes of hearing better, however this did not work and she still has muffled sensation in her ears. Arrives A+O, VSS, NADN.

## 2023-07-05 ENCOUNTER — Ambulatory Visit (HOSPITAL_COMMUNITY)
Admission: EM | Admit: 2023-07-05 | Discharge: 2023-07-05 | Disposition: A | Discharge status: Home / Self Care | Payer: Medicaid Other | Attending: Internal Medicine | Admitting: Internal Medicine

## 2023-07-05 ENCOUNTER — Other Ambulatory Visit: Payer: Self-pay

## 2023-07-05 ENCOUNTER — Emergency Department (HOSPITAL_COMMUNITY)
Admission: EM | Admit: 2023-07-05 | Discharge: 2023-07-05 | Disposition: A | Discharge status: Home / Self Care | Payer: Medicare (Managed Care) | Source: Home / Self Care | Attending: Emergency Medicine | Admitting: Emergency Medicine

## 2023-07-05 ENCOUNTER — Encounter (HOSPITAL_COMMUNITY): Payer: Self-pay

## 2023-07-05 DIAGNOSIS — H9203 Otalgia, bilateral: Secondary | ICD-10-CM | POA: Diagnosis not present

## 2023-07-05 DIAGNOSIS — H9193 Unspecified hearing loss, bilateral: Secondary | ICD-10-CM

## 2023-07-05 DIAGNOSIS — H6123 Impacted cerumen, bilateral: Secondary | ICD-10-CM | POA: Insufficient documentation

## 2023-07-05 MED ORDER — CARBAMIDE PEROXIDE 6.5 % OT SOLN: 5.0000 [drp] | Freq: Two times a day (BID) | OTIC | 0 refills | Status: AC

## 2023-07-05 NOTE — ED Notes (Signed)
Pt was called for vitals check again, pt is hard of hearing; walked around waiting room, bathrooms are clear, and walked outside but did not see pt

## 2023-07-05 NOTE — ED Triage Notes (Signed)
Patient reports loss of hearing in both ears since yesterday. Patient able to converse appropriately in triage. Sent here from [facility] for further eval since she cannot get an ENT appointment until November. She states she has tried to clean her ears out with a bobby pin.

## 2023-07-05 NOTE — ED Provider Notes (Signed)
[place] EMERGENCY DEPARTMENT AT [facility] Provider Note   CSN: [id number] Arrival date & time: 07/05/23  1420     History  No chief complaint on file.   Mercedes Gardner is a 77 y.o. female.  77 year old female presents with complaint of difficulty hearing out of both the ears.  Patient went to urgent care today and was told that she had wax in her ears and referred to ENT however could not get a timely appointment so she came to the ER for further evaluation.  Denies trauma, fever, drainage.  No other complaints or concerns.       Home Medications Prior to Admission medications   Medication Sig Start Date End Date Taking? Authorizing Provider  carbamide peroxide (DEBROX) 6.5 % OTIC solution Place 5 drops into both ears 2 (two) times daily. 07/05/23  Yes Jeannie Fend, PA-C  acetaminophen-codeine (TYLENOL #3) 300-30 MG tablet TAKE 1 TABLET BY MOUTH EVERY 8 HOURS AS NEEDED 06/03/23   Kirtland Bouchard, PA-C  amLODipine (NORVASC) 10 MG tablet take 1 tablet by mouth once daily 12/27/16   Gust Rung, DO  busPIRone (BUSPAR) 7.5 MG tablet Take 1 tablet (7.5 mg total) by mouth 2 (two) times daily. 10/26/17   Anne Shutter, MD  clopidogrel (PLAVIX) 75 MG tablet take 1 tablet by mouth once daily 01/14/17   Gust Rung, DO  clotrimazole-betamethasone (LOTRISONE) cream Apply to affected area 2 times daily prn 10/23/17   Ofilia Neas, PA-C  COLCRYS 0.6 MG tablet Take 1 tablet (0.6 mg total) by mouth 2 (two) times daily. 10/26/17   Anne Shutter, MD  conjugated estrogens (PREMARIN) vaginal cream Place 1 Applicatorful vaginally daily. 02/22/17   Rice, Jamesetta Orleans, MD  diclofenac sodium (VOLTAREN) 1 % GEL Apply 2 g topically 4 (four) times daily. 10/08/17   Sharlene Dory, DO  ferrous sulfate 325 (65 FE) MG tablet Take 1 tablet (325 mg total) by mouth 2 (two) times daily with a meal. 10/22/16   Angiulli, Mcarthur Rossetti, PA-C  fluconazole (DIFLUCAN) 150 MG tablet  Take 1 tablet (150 mg total) by mouth daily. 07/02/19   Bast, Gloris Manchester A, NP  fluticasone (FLONASE) 50 MCG/ACT nasal spray Place 2 sprays into both nostrils as needed.  04/17/14   Presson, Mathis Fare, PA  folic acid (FOLVITE) 1 MG tablet Take 1 tablet (1 mg total) by mouth daily. 10/22/16   Angiulli, Mcarthur Rossetti, PA-C  loratadine (CLARITIN) 10 MG tablet  05/29/17   [provider]  meclizine (ANTIVERT) 25 MG tablet Take 1/2-1 tablet up to 3 times a day as needed for dizziness may calls some drowsiness. 04/26/17   Hayden Rasmussen, NP  Multiple Vitamin (MULTIVITAMIN WITH MINERALS) TABS tablet Take 1 tablet by mouth daily. 10/22/16   Angiulli, Mcarthur Rossetti, PA-C  pantoprazole (PROTONIX) 40 MG tablet TK 1 T PO QD 01/16/19   [provider]  RA ASPIRIN EC 325 MG EC tablet  04/14/17   [provider]  rosuvastatin (CRESTOR) 20 MG tablet Take 1 tablet (20 mg total) by mouth daily. 03/24/17   Gust Rung, DO  triamcinolone cream (KENALOG) 0.5 % APPLY TO AFFECTED AREA TWICE A DAY IF NEEDED 01/04/18   [provider]      Allergies    Tramadol and Topiramate    Review of Systems   Review of Systems Negative except as per HPI Physical Exam Updated Vital Signs BP (!) 142/76  Pulse 90   Temp 98.5 F (36.9 C) (Oral)   Resp 18   SpO2 100%  Physical Exam Vitals and nursing note reviewed.  Constitutional:      General: She is not in acute distress.    Appearance: She is well-developed. She is not diaphoretic.  HENT:     Head: Normocephalic and atraumatic.     Right Ear: There is impacted cerumen.     Left Ear: There is impacted cerumen.     Nose: Nose normal. No congestion.     Mouth/Throat:     Mouth: Mucous membranes are moist.  Pulmonary:     Effort: Pulmonary effort is normal.  Neurological:     Mental Status: She is alert and oriented to person, place, and time.  Psychiatric:        Behavior: Behavior normal.     ED Results / Procedures / Treatments   Labs (all  labs ordered are listed, but only abnormal results are displayed) Labs Reviewed - No data to display  EKG None  Radiology No results found.  Procedures Procedures    Medications Ordered in ED Medications - No data to display  ED Course/ Medical Decision Making/ A&P                                 Medical Decision Making  77 year old female presents with diminished hearing from both ears.  Is found to have impacted cerumen in both ear canals.  Urgent care record from earlier today was reviewed, impacted cerumen at that time and referred to ENT.  Patient is provided with Debrox prescription for eardrops, encouraged to use the drops to soften the wax to make your irrigation easier which can either be done at her PCP office or through the emergency room.        Final Clinical Impression(s) / ED Diagnoses Final diagnoses:  Bilateral impacted cerumen    Rx / DC Orders ED Discharge Orders          Ordered    carbamide peroxide (DEBROX) 6.5 % OTIC solution  2 times daily        07/05/23 1500              Alden Hipp 07/05/23 1507    Lonell Grandchild, MD 07/06/23 [phone]

## 2023-07-05 NOTE — ED Triage Notes (Signed)
Pt c/o ear fullness to bilateral ears for months. States can't hear at all and has lost her voice. States having congestion and runny nose. Pt also c/o swelling to all her fingers from where she got wood under her nails.

## 2023-07-05 NOTE — Discharge Instructions (Signed)
Use the eardrop as prescribed.  You can follow-up with your primary care provider or the ear nose and throat doctor as previously referred.

## 2023-07-05 NOTE — ED Provider Notes (Signed)
[facility]    CSN: [id number] Arrival date & time: 07/05/23  1054      History   Chief Complaint Chief Complaint  Patient presents with   Ear Fullness    HPI MARGIA SCHMIDT is a 77 y.o. female.    Ear Fullness  Decreased hearing both ears did several days ago.  States ears feels full.  Has history of seasonal allergies.  Admits nasal congestion and hoarse voice.  Denies fever, chills, sweats.  Concerned about a splinter in her right index finger.   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)    TIA (transient ischemic attack)    Tinea corporis 2017    Patient Active Problem List   Diagnosis Date Noted   Stroke (cerebrum) (HCC) 02/01/2023   Unilateral primary osteoarthritis, right knee 03/05/2019   Tuberculosis screening 06/08/2017   Generalized anxiety disorder 03/24/2017   Eczema of both upper extremities 03/24/2017   Ventral hernia without obstruction or gangrene 03/23/2017   Healthcare maintenance 12/07/2016   Incidental lung nodule, greater than or equal to 8mm    History of stroke    Normocytic anemia 10/11/2016   Personal history of gout 12/24/2008   ALLERGIC RHINITIS 12/20/2008   Unilateral primary osteoarthritis, left knee 12/20/2008   TOBACCO ABUSE 07/29/2006   Essential hypertension 07/29/2006    Past Surgical History:  Procedure Laterality Date   CESAREAN SECTION     x3   IR GENERIC HISTORICAL  11/30/2016   IR ANGIO  VERTEBRAL SEL VERTEBRAL UNI L MOD SED 11/30/2016 Julieanne Cotton, MD [facility]   IR GENERIC HISTORICAL  11/30/2016   IR ANGIO INTRA EXTRACRAN SEL COM CAROTID INNOMINATE BILAT MOD SED 11/30/2016 Julieanne Cotton, MD [facility]   IR GENERIC HISTORICAL  11/30/2016   IR ANGIO VERTEBRAL SEL SUBCLAVIAN INNOMINATE UNI R MOD SED 11/30/2016 Julieanne Cotton, MD [facility]   OVARIAN CYST SURGERY Right 1980's   SALPINGOOPHORECTOMY Bilateral 02/06/2015   Procedure: Benay Pillow LAPAROTOMY/BILATERAL SALPINGO OOPHORECTOMY;  Surgeon: Adolphus Birchwood, MD;  Location: [facility];  Service: Gynecology;  Laterality: Bilateral;    OB History     Gravida  4   Para  3   Term  3   Preterm      AB  1   Living         SAB      IAB      Ectopic      Multiple      Live Births               Home Medications    Prior to Admission medications   Medication Sig Start Date End Date Taking? Authorizing Provider  acetaminophen-codeine (TYLENOL #3) 300-30 MG tablet TAKE 1 TABLET BY MOUTH EVERY 8 HOURS  AS NEEDED 06/03/23   Kirtland Bouchard, PA-C  amLODipine (NORVASC) 10 MG tablet take 1 tablet by mouth once daily 12/27/16   Gust Rung, DO  busPIRone (BUSPAR) 7.5 MG tablet Take 1 tablet (7.5 mg total) by mouth 2 (two) times daily. 10/26/17   Anne Shutter, MD  clopidogrel (PLAVIX) 75 MG tablet take 1 tablet by mouth once daily 01/14/17   Gust Rung, DO  clotrimazole-betamethasone (LOTRISONE) cream Apply to affected area 2 times daily prn 10/23/17   Ofilia Neas, PA-C  COLCRYS 0.6 MG tablet Take 1 tablet (0.6 mg total) by mouth 2 (two) times daily. 10/26/17   Anne Shutter, MD  conjugated estrogens (PREMARIN) vaginal cream Place 1 Applicatorful vaginally daily. 02/22/17   Rice, Jamesetta Orleans, MD  diclofenac sodium (VOLTAREN) 1 % GEL Apply 2 g topically 4 (four) times daily. 10/08/17   Sharlene Dory, DO  ferrous sulfate 325 (65 FE) MG tablet Take 1 tablet (325 mg total) by  mouth 2 (two) times daily with a meal. 10/22/16   Angiulli, Mcarthur Rossetti, PA-C  fluconazole (DIFLUCAN) 150 MG tablet Take 1 tablet (150 mg total) by mouth daily. 07/02/19   Bast, Gloris Manchester A, NP  fluticasone (FLONASE) 50 MCG/ACT nasal spray Place 2 sprays into both nostrils as needed.  04/17/14   Presson, Mathis Fare, PA  folic acid (FOLVITE) 1 MG tablet Take 1 tablet (1 mg total) by mouth daily. 10/22/16   Angiulli, Mcarthur Rossetti, PA-C  loratadine (CLARITIN) 10 MG tablet  05/29/17   [provider]  meclizine (ANTIVERT) 25 MG tablet Take 1/2-1 tablet up to 3 times a day as needed for dizziness may calls some drowsiness. 04/26/17   Hayden Rasmussen, NP  Multiple Vitamin (MULTIVITAMIN WITH MINERALS) TABS tablet Take 1 tablet by mouth daily. 10/22/16   Angiulli, Mcarthur Rossetti, PA-C  pantoprazole (PROTONIX) 40 MG tablet TK 1 T PO QD 01/16/19   [provider]  RA ASPIRIN EC 325 MG EC tablet  04/14/17   [provider]  rosuvastatin (CRESTOR) 20 MG tablet Take 1 tablet (20 mg total) by mouth daily. 03/24/17   Gust Rung, DO  triamcinolone cream (KENALOG) 0.5 % APPLY TO AFFECTED AREA TWICE A DAY IF NEEDED 01/04/18   [provider]    Family History Family History  Problem Relation Age of Onset   Hypertension Mother    Diabetes Mother    Breast cancer Mother    Diabetes Sister    Hypertension Sister    Other Father        accident    Social History Social History   Tobacco Use   Smoking status: Every Day    Current packs/day: 0.02    Types: Cigarettes   Smokeless tobacco: Never  Vaping Use   Vaping status: Never Used  Substance Use Topics   Alcohol use: No    Comment: Quit 10/2016   Drug use: No     Allergies   Tramadol and Topiramate   Review of Systems Review of Systems  Constitutional:  Negative for fever.  HENT:  Positive for hearing loss, rhinorrhea and voice change. Negative for ear pain and sore throat.   Respiratory:  Negative for cough.      Physical  Exam Triage Vital Signs ED Triage Vitals  Encounter Vitals Group     BP 07/05/23 1129 (!) 142/75     Systolic BP Percentile --      Diastolic BP Percentile --  Pulse Rate 07/05/23 1129 91     Resp 07/05/23 1129 18     Temp 07/05/23 1129 99.2 F (37.3 C)     Temp Source 07/05/23 1129 Oral     SpO2 07/05/23 1129 96 %     Weight --      Height --      Head Circumference --      Peak Flow --      Pain Score 07/05/23 1130 0     Pain Loc --      Pain Education --      Exclude from Growth Chart --    No data found.  Updated Vital Signs BP (!) 142/75 (BP Location: Left Arm)   Pulse 91   Temp 99.2 F (37.3 C) (Oral)   Resp 18   SpO2 96%   Visual Acuity Right Eye Distance:   Left Eye Distance:   Bilateral Distance:    Right Eye Near:   Left Eye Near:    Bilateral Near:     Physical Exam HENT:     Head: Normocephalic and atraumatic.     Ears:     Comments: Canals are small and tortuous with cerumen impaction noted, TMs poorly visualized but visual portions appear normal    Mouth/Throat:     Mouth: Mucous membranes are moist.  Eyes:     Conjunctiva/sclera: Conjunctivae normal.  Skin:    Comments: Right index finger has small dark raised area lateral aspect suspect foreign body, minimal local swelling no erythema no red streaks no drainage   good range of motion  Neurological:     Mental Status: She is alert and oriented to person, place, and time.      UC Treatments / Results  Labs (all labs ordered are listed, but only abnormal results are displayed) Labs Reviewed - No data to display  EKG   Radiology No results found.  Procedures Procedures (including critical care time)  Medications Ordered in UC Medications - No data to display  Initial Impression / Assessment and Plan / UC Course  I have reviewed the triage vital signs and the nursing notes.  Pertinent labs & imaging results that were available during my care of the patient were reviewed by  me and considered in my medical decision making (see chart for details).     Skin prep, topical anesthetic, lesion open using 18-gauge needle, no active drainage, area cleaned, I no longer see the black dot on the skin suspect the foreign body removed though not visualized.  Band-Aid applied  Regarding hearing loss recommend follow-up with ENT Final Clinical Impressions(s) / UC Diagnoses   Final diagnoses:  None   Discharge Instructions   None    ED Prescriptions   None    PDMP not reviewed this encounter.   Meliton Rattan, [place] 07/05/23 1154

## 2023-07-05 NOTE — Discharge Instructions (Signed)
Follow-up with the ear nose and throat doctor

## 2023-07-05 NOTE — ED Notes (Addendum)
Pt was called for vitals check, pt is hard of hearing; walked around waiting room, bathrooms are clear, and walked outside but did not see pt

## 2023-07-14 ENCOUNTER — Other Ambulatory Visit: Payer: Self-pay | Admitting: Physician Assistant

## 2023-08-09 ENCOUNTER — Ambulatory Visit: Payer: Medicare (Managed Care) | Admitting: Physician Assistant

## 2023-08-24 ENCOUNTER — Ambulatory Visit: Payer: Medicare (Managed Care) | Admitting: Physician Assistant

## 2023-08-24 ENCOUNTER — Encounter: Payer: Self-pay | Admitting: Physician Assistant

## 2023-08-24 ENCOUNTER — Ambulatory Visit (INDEPENDENT_AMBULATORY_CARE_PROVIDER_SITE_OTHER): Payer: Medicare (Managed Care) | Admitting: Physician Assistant

## 2023-08-24 DIAGNOSIS — M170 Bilateral primary osteoarthritis of knee: Secondary | ICD-10-CM

## 2023-08-24 DIAGNOSIS — M1711 Unilateral primary osteoarthritis, right knee: Secondary | ICD-10-CM

## 2023-08-24 DIAGNOSIS — M1712 Unilateral primary osteoarthritis, left knee: Secondary | ICD-10-CM

## 2023-08-24 MED ORDER — METHYLPREDNISOLONE ACETATE 40 MG/ML IJ SUSP
40.0000 mg | INTRAMUSCULAR | Status: AC | PRN
Start: 2023-08-24 — End: 2023-08-24
  Administered 2023-08-24: 40 mg via INTRA_ARTICULAR

## 2023-08-24 MED ORDER — LIDOCAINE HCL 1 % IJ SOLN
3.0000 mL | INTRAMUSCULAR | Status: AC | PRN
Start: 2023-08-24 — End: 2023-08-24
  Administered 2023-08-24: 3 mL

## 2023-08-24 NOTE — Progress Notes (Signed)
Office Visit Note   Patient: Mercedes Gardner           Date of Birth: March 12, 1946           MRN: [id number] Visit Date: 08/24/2023              Requested by: Fleet Contras, MD [address] [place],  [place] [address] PCP: Fleet Contras, MD  Chief Complaint  Patient presents with  . Right Knee - Pain  . Left Knee - Pain      HPI: Mercedes Gardner is a 77 year old woman with a history of osteoarthritis of her bilateral knees.  She gets good relief from steroid injections.  Requesting them today no new injuries  Assessment & Plan: Visit Diagnoses: Osteoarthritis bilateral knees  Plan: Knees injected without difficulty she may follow-up as needed  Follow-Up Instructions: No follow-ups on file.  Ortho Exam  Patient is alert, oriented, no adenopathy, well-dressed, normal affect, normal respiratory effort. Bilateral knees no effusion no erythema compartments are soft and compressible she is neurovascular intact  Imaging: No results found. No images are attached to the encounter.  Labs: Lab Results  Component Value Date   HGBA1C 5.8 (H) 02/02/2023   HGBA1C 5.4 10/12/2016   ESRSEDRATE 11 01/10/2020   LABURIC 6.1 05/02/2023   LABURIC 5.3 02/18/2021   LABURIC 5.1 01/10/2020   REPTSTATUS 10/25/2017 FINAL 10/23/2017   CULT 40,000 COLONIES/mL ESCHERICHIA COLI (A) 10/23/2017   LABORGA ESCHERICHIA COLI (A) 10/23/2017     Lab Results  Component Value Date   ALBUMIN 3.1 (L) 02/02/2023   ALBUMIN 3.5 02/01/2023   ALBUMIN 3.3 (L) 09/03/2022    Lab Results  Component Value Date   MG 2.2 10/11/2016   No results found for: "VD25OH"  No results found for: "PREALBUMIN"    Latest Ref Rng & Units 05/02/2023    4:10 AM 02/02/2023    6:18 AM 02/01/2023    8:07 AM  CBC EXTENDED  WBC 3.8 - 10.8 Thousand/uL 7.1  4.9    RBC 3.80 - 5.10 Million/uL 3.37  3.87    Hemoglobin 11.7 - 15.5 g/dL 62.1  30.8  65.7   HCT 35.0 - 45.0 % 33.0  37.8  38.0   Platelets 140 - 400 Thousand/uL 184  159        There is no height or weight on file to calculate BMI.  Orders:  No orders of the defined types were placed in this encounter.  No orders of the defined types were placed in this encounter.    Procedures: Large Joint Inj: bilateral knee on 08/24/2023 8:47 AM Indications: pain and diagnostic evaluation Details: 25 G 1.5 in needle, anterolateral approach  Arthrogram: No  Medications (Right): 3 mL lidocaine 1 %; 40 mg methylPREDNISolone acetate 40 MG/ML Medications (Left): 3 mL lidocaine 1 %; 40 mg methylPREDNISolone acetate 40 MG/ML Outcome: tolerated well, no immediate complications Procedure, treatment alternatives, risks and benefits explained, specific risks discussed. Consent was given by the patient.    Clinical Data: No additional findings.  ROS:  All other systems negative, except as noted in the HPI. Review of Systems  Objective: Vital Signs: There were no vitals taken for this visit.  Specialty Comments:  No specialty comments available.  PMFS History: Patient Active Problem List   Diagnosis Date Noted  . 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 Unilateral primary osteoarthritis, right knee 03/05/2019  . Tuberculosis screening 06/08/2017  . Generalized anxiety disorder 03/24/2017  . Eczema of both upper  extremities 03/24/2017  . Ventral hernia without obstruction or gangrene 03/23/2017  . Healthcare maintenance 12/07/2016  . Incidental lung nodule, greater than or equal to 8mm   . History of stroke   . Normocytic anemia 10/11/2016  . Personal history of gout 12/24/2008  . ALLERGIC RHINITIS 12/20/2008  . Unilateral primary osteoarthritis, left knee 12/20/2008  . TOBACCO ABUSE 07/29/2006  . Essential hypertension 07/29/2006   Past Medical History:  Diagnosis Date  . Anemia   . Anxiety   . Arthritis    knees   . Chronic vulvovaginitis   . Degenerative joint disease (DJD) of lumbar spine   . Dysrhythmia    heart skips a beat   . Eczema   .  GERD (gastroesophageal reflux disease)   . Gout    left knee  . Hyperlipidemia April 2014  . Hypertension   . Osteoarthritis of left knee   . Ovarian cyst    removed during mini lap 2016, benign path, Dr Andrey Farmer  . Shortness of breath dyspnea    due to pressure of cyst per patient   . Stroke (HCC) 10/2016  . 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 TIA (transient ischemic attack)   . Tinea corporis 2017    Family History  Problem Relation Age of Onset  . Hypertension Mother   . Diabetes Mother   . Breast cancer Mother   . Diabetes Sister   . Hypertension Sister   . Other Father        accident    Past Surgical History:  Procedure Laterality Date  . CESAREAN SECTION     x3  . IR GENERIC HISTORICAL  11/30/2016   IR ANGIO VERTEBRAL SEL VERTEBRAL UNI L MOD SED 11/30/2016 Julieanne Cotton, MD [facility]  . IR GENERIC HISTORICAL  11/30/2016   IR ANGIO INTRA EXTRACRAN SEL COM CAROTID INNOMINATE BILAT MOD SED 11/30/2016 Julieanne Cotton, MD [facility]  . IR GENERIC HISTORICAL  11/30/2016   IR ANGIO VERTEBRAL SEL SUBCLAVIAN INNOMINATE UNI R MOD SED 11/30/2016 Julieanne Cotton, MD [facility]  . OVARIAN CYST SURGERY Right 1980's  . SALPINGOOPHORECTOMY Bilateral 02/06/2015   Procedure: Benay Pillow LAPAROTOMY/BILATERAL SALPINGO OOPHORECTOMY;  Surgeon: Adolphus Birchwood, MD;  Location: [facility];  Service: Gynecology;  Laterality: Bilateral;   Social History   Occupational History  . Occupation: Runner, broadcasting/film/video  Tobacco Use  . Smoking status: Every Day    Current packs/day: 0.02    Types: Cigarettes  . Smokeless tobacco: Never  Vaping Use  . Vaping status: Never Used  Substance and Sexual Activity  . Alcohol use: No    Comment: Quit 10/2016  . Drug use: No  . Sexual activity: Never

## 2023-08-25 ENCOUNTER — Ambulatory Visit: Payer: Medicare (Managed Care) | Admitting: Physician Assistant

## 2023-09-09 ENCOUNTER — Telehealth: Payer: Self-pay | Admitting: Orthopaedic Surgery

## 2023-09-09 ENCOUNTER — Other Ambulatory Visit: Payer: Self-pay | Admitting: Physician Assistant

## 2023-09-09 ENCOUNTER — Telehealth: Payer: Self-pay | Admitting: Physician Assistant

## 2023-09-09 NOTE — Telephone Encounter (Signed)
Patient called and said she needs a refill on pain medication. Preferably Tylenol 3. CB# [phone]

## 2023-09-09 NOTE — Telephone Encounter (Signed)
Called to pharmacy. I called patient and advised. 

## 2023-09-09 NOTE — Telephone Encounter (Signed)
This is being addressed.

## 2023-09-09 NOTE — Telephone Encounter (Signed)
Pt called requesting refill of tylenol 3. Pt states she is going out of town for holiday. Please send to pharmacy [facility]. Pt phone number is [phone].

## 2023-12-16 ENCOUNTER — Encounter: Payer: Self-pay | Admitting: Physician Assistant

## 2024-01-02 ENCOUNTER — Ambulatory Visit (INDEPENDENT_AMBULATORY_CARE_PROVIDER_SITE_OTHER): Admitting: Physician Assistant

## 2024-01-02 DIAGNOSIS — M170 Bilateral primary osteoarthritis of knee: Secondary | ICD-10-CM | POA: Diagnosis not present

## 2024-01-02 DIAGNOSIS — M1712 Unilateral primary osteoarthritis, left knee: Secondary | ICD-10-CM

## 2024-01-02 DIAGNOSIS — M1711 Unilateral primary osteoarthritis, right knee: Secondary | ICD-10-CM

## 2024-01-02 MED ORDER — METHYLPREDNISOLONE ACETATE 40 MG/ML IJ SUSP
40.0000 mg | INTRAMUSCULAR | Status: AC | PRN
  Administered 2024-01-02: 40 mg via INTRA_ARTICULAR

## 2024-01-02 MED ORDER — ACETAMINOPHEN-CODEINE 300-30 MG PO TABS: 1.0000 | ORAL_TABLET | Freq: Three times a day (TID) | ORAL | 0 refills | Status: DC | PRN

## 2024-01-02 MED ORDER — LIDOCAINE HCL 1 % IJ SOLN
3.0000 mL | INTRAMUSCULAR | Status: AC | PRN
  Administered 2024-01-02: 3 mL

## 2024-01-02 NOTE — Progress Notes (Signed)
   Procedure Note  Patient: Mercedes Gardner             Date of Birth: July 20, 1946           MRN: [id number]             Visit Date: 01/02/2024  HPI: Mercedes Gardner returns today requesting bilateral knee injections.  States she has had no changes in her overall medical health.  She remains on Plavix  she does use Voltaren  gel on her knees and finds this beneficial.  Denies any fevers chills.  Asking for refill on her Tylenol  3 which has not been filled since December.  She uses this only when she has severe pain.  No new injury to either knee.  Review of systems: See HPI otherwise negative or noncontributory.  Physical exam: General Well-developed well-nourished female in no acute distress ambulates with nonantalgic gait.  No assistive device. Bilateral knees: Good range of motion both knees no abnormal warmth erythema or effusion.  No gross instability.  Patellofemoral crepitus both knees with range of motion. Procedures: Visit Diagnoses:  1. Unilateral primary osteoarthritis, left knee   2. Unilateral primary osteoarthritis, right knee     Large Joint Inj: bilateral knee on 01/02/2024 9:10 AM Indications: pain Details: 22 G 1.5 in needle, anterolateral approach  Arthrogram: No  Medications (Right): 3 mL lidocaine  1 %; 40 mg methylPREDNISolone  acetate 40 MG/ML Medications (Left): 3 mL lidocaine  1 %; 40 mg methylPREDNISolone  acetate 40 MG/ML Outcome: tolerated well, no immediate complications Procedure, treatment alternatives, risks and benefits explained, specific risks discussed. Consent was given by the patient. Immediately prior to procedure a time out was called to verify the correct patient, procedure, equipment, support staff and site/side marked as required. Patient was prepped and draped in the usual sterile fashion.     Plan: She knows to wait at least 3 months between injections.  She will follow-up with us  as needed.  Refill of Tylenol  3 was given she will use this sparingly.   Questions were encouraged and answered at length

## 2024-01-19 ENCOUNTER — Other Ambulatory Visit: Payer: Self-pay | Admitting: Internal Medicine

## 2024-01-19 DIAGNOSIS — Z0000 Encounter for general adult medical examination without abnormal findings: Secondary | ICD-10-CM

## 2024-01-23 ENCOUNTER — Ambulatory Visit
Admission: RE | Admit: 2024-01-23 | Discharge: 2024-01-23 | Disposition: A | Discharge status: Home / Self Care | Source: Ambulatory Visit | Attending: Internal Medicine | Admitting: Internal Medicine

## 2024-01-23 DIAGNOSIS — Z0000 Encounter for general adult medical examination without abnormal findings: Secondary | ICD-10-CM

## 2024-02-14 NOTE — Progress Notes (Deleted)
 02/14/2024 Mercedes Gardner [id number] [birth year]  Referring provider: Charle Congo, MD Primary GI doctor: {[email or web address]:[id number]}  ASSESSMENT AND PLAN:  IDA  longstanding history since at least 2018 when iron was 21, most recent was 43 with normal ferritin. 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 Janett Medin  GERD  CT abdomen pelvis with contrast 09/2016 for weight loss shows left adrenal nodule 13 mm, mild emphysema aortic atherosclerosis small fat-containing ventral hernia small hypodensities in the liver too small to characterize unremarkable gallbladder, no biliary dilation normal pancreas stomach with ingested contents no gastric thickening unremarkable bowels did show moderate stool burden with torturous sigmoid colon   Patient Care Team: Mercedes Congo, MD as PCP - General (Internal Medicine)  HISTORY OF PRESENT ILLNESS: 78 y.o. female with a past medical history listed below presents for evaluation of ***.   *** Discussed the use of AI scribe software for clinical note transcription with the patient, who gave verbal consent to proceed.  History of Present Illness            She  reports that she has been smoking cigarettes. She has never used smokeless tobacco. She reports that she does not drink alcohol and does not use drugs.  RELEVANT GI HISTORY, IMAGING AND LABS: Results          CBC    Component Value Date/Time   WBC 7.1 05/02/2023 0410   RBC 3.37 (L) 05/02/2023 0410   HGB 10.9 (L) 05/02/2023 0410   HCT 33.0 (L) 05/02/2023 0410   PLT 184 05/02/2023 0410   MCV 97.9 05/02/2023 0410   MCH 32.3 05/02/2023 0410   MCHC 33.0 05/02/2023 0410   RDW 11.0 05/02/2023 0410   LYMPHSABS 0.9 02/01/2023 0803    MONOABS 0.3 02/01/2023 0803   EOSABS 0.1 02/01/2023 0803   BASOSABS 0.0 02/01/2023 0803   Recent Labs    05/02/23 0410  HGB 10.9*    CMP     Component Value Date/Time   NA 141 05/02/2023 0410   K 3.8 05/02/2023 0410   CL 114 (H) 05/02/2023 0410   CO2 16 (L) 05/02/2023 0410   GLUCOSE 84 05/02/2023 0410   BUN 15 05/02/2023 0410   CREATININE 0.98 05/02/2023 0410   CALCIUM  9.4 05/02/2023 0410   PROT 7.3 05/02/2023 0410   ALBUMIN 3.1 (L) 02/02/2023 0618   AST 16 05/02/2023 0410   ALT 7 05/02/2023 0410   ALKPHOS 87 02/02/2023 0618   BILITOT 0.2 05/02/2023 0410   GFRNONAA >60 02/02/2023 0618   GFRNONAA 56 (L) 02/18/2021 1553   GFRAA 65 02/18/2021 1553      Latest Ref Rng & Units 05/02/2023    4:10 AM 02/02/2023    6:18 AM 02/01/2023    8:03 AM  Hepatic Function  Total Protein 6.1 - 8.1 g/dL 7.3  6.6  7.2   Albumin 3.5 - 5.0 g/dL  3.1  3.5   AST 10 - 35 U/L 16  15  15    ALT 6 - 29 U/L 7  11  12    Alk Phosphatase 38 - 126 U/L  87  94   Total Bilirubin 0.2 - 1.2 mg/dL 0.2  0.3  0.7  Current Medications:    Current Outpatient Medications (Cardiovascular):    amLODipine  (NORVASC ) 10 MG tablet, take 1 tablet by mouth once daily   rosuvastatin  (CRESTOR ) 20 MG tablet, Take 1 tablet (20 mg total) by mouth daily.  Current Outpatient Medications (Respiratory):    fluticasone  (FLONASE ) 50 MCG/ACT nasal spray, Place 2 sprays into both nostrils as needed.    loratadine  (CLARITIN ) 10 MG tablet,   Current Outpatient Medications (Analgesics):    acetaminophen -codeine  (TYLENOL  #3) 300-30 MG tablet, Take 1 tablet by mouth every 8 (eight) hours as needed.   COLCRYS  0.6 MG tablet, Take 1 tablet (0.6 mg total) by mouth 2 (two) times daily.   RA ASPIRIN  EC 325 MG EC tablet,   Current Outpatient Medications (Hematological):    clopidogrel  (PLAVIX ) 75 MG tablet, take 1 tablet by mouth once daily   ferrous sulfate  325 (65 FE) MG tablet, Take 1 tablet (325 mg total) by mouth 2 (two)  times daily with a meal.   folic acid  (FOLVITE ) 1 MG tablet, Take 1 tablet (1 mg total) by mouth daily.  Current Outpatient Medications (Other):    busPIRone  (BUSPAR ) 7.5 MG tablet, Take 1 tablet (7.5 mg total) by mouth 2 (two) times daily.   carbamide peroxide (DEBROX) 6.5 % OTIC solution, Place 5 drops into both ears 2 (two) times daily.   clotrimazole -betamethasone  (LOTRISONE ) cream, Apply to affected area 2 times daily prn   conjugated estrogens  (PREMARIN ) vaginal cream, Place 1 Applicatorful vaginally daily.   diclofenac  sodium (VOLTAREN ) 1 % GEL, Apply 2 g topically 4 (four) times daily.   fluconazole  (DIFLUCAN ) 150 MG tablet, Take 1 tablet (150 mg total) by mouth daily.   meclizine  (ANTIVERT ) 25 MG tablet, Take 1/2-1 tablet up to 3 times a day as needed for dizziness may calls some drowsiness.   Multiple Vitamin (MULTIVITAMIN WITH MINERALS) TABS tablet, Take 1 tablet by mouth daily.   pantoprazole  (PROTONIX ) 40 MG tablet, TK 1 T PO QD   triamcinolone  cream (KENALOG ) 0.5 %, APPLY TO AFFECTED AREA TWICE A DAY IF NEEDED  Medical History:  Past Medical History:  Diagnosis Date   Anemia    Anxiety    Arthritis    knees    Chronic vulvovaginitis    Degenerative joint disease (DJD) of lumbar spine    Dysrhythmia    heart skips a beat    Eczema    GERD (gastroesophageal reflux disease)    Gout    left knee   Hyperlipidemia April 2014   Hypertension    Osteoarthritis of left knee    Ovarian cyst    removed during mini lap 2016, benign path, Dr Pearly Bound   Shortness of breath dyspnea    due to pressure of cyst per patient    Stroke [facility]) 10/2016   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 Family History:  Her family history includes Breast cancer in her cousin, mother, and sister; Diabetes in her mother and sister; Hypertension in her mother and sister;  Other in her father.  REVIEW OF SYSTEMS  : All other systems reviewed and negative except where noted in the History of Present Illness.  PHYSICAL EXAM: There were no vitals taken for this visit. Physical Exam          Edmonia Gottron, PA-C 1:46 PM

## 2024-02-15 ENCOUNTER — Ambulatory Visit: Admitting: Physician Assistant

## 2024-03-25 ENCOUNTER — Other Ambulatory Visit: Payer: Self-pay | Admitting: Physician Assistant

## 2024-04-02 ENCOUNTER — Ambulatory Visit (INDEPENDENT_AMBULATORY_CARE_PROVIDER_SITE_OTHER): Admitting: Physician Assistant

## 2024-04-02 DIAGNOSIS — M1712 Unilateral primary osteoarthritis, left knee: Secondary | ICD-10-CM

## 2024-04-02 DIAGNOSIS — M1711 Unilateral primary osteoarthritis, right knee: Secondary | ICD-10-CM

## 2024-04-02 MED ORDER — LIDOCAINE HCL 1 % IJ SOLN
3.0000 mL | INTRAMUSCULAR | Status: AC | PRN
  Administered 2024-04-02: 3 mL

## 2024-04-02 MED ORDER — METHYLPREDNISOLONE ACETATE 40 MG/ML IJ SUSP
40.0000 mg | INTRAMUSCULAR | Status: AC | PRN
  Administered 2024-04-02: 40 mg via INTRA_ARTICULAR

## 2024-04-02 MED ORDER — LIDOCAINE HCL 1 % IJ SOLN
3.0000 mL | INTRAMUSCULAR | Status: AC | PRN
Start: 2024-04-02 — End: 2024-04-02
  Administered 2024-04-02: 3 mL

## 2024-04-02 NOTE — Progress Notes (Signed)
   Procedure Note  Patient: Mercedes Gardner             Date of Birth: 1946-08-17           MRN: [id number]             Visit Date: 04/02/2024 HPI: Mercedes Gardner comes in today requesting cortisone injections both knees.  She states the injections in both knees on 01/02/2024 were very helpful.  The knee pain with this began bothering her recently.  No known injury.  No fevers chills.  Physical exam: Bilateral knees no abnormal warmth erythema or effusion.  Good range of motion of knees.  Procedures: Visit Diagnoses:  1. Unilateral primary osteoarthritis, left knee   2. Unilateral primary osteoarthritis, right knee     Large Joint Inj: bilateral knee on 04/02/2024 12:15 PM Indications: pain Details: 22 G 1.5 in needle, anterolateral approach  Arthrogram: No  Medications (Right): 3 mL lidocaine  1 %; 40 mg methylPREDNISolone  acetate 40 MG/ML Medications (Left): 3 mL lidocaine  1 %; 40 mg methylPREDNISolone  acetate 40 MG/ML Outcome: tolerated well, no immediate complications Procedure, treatment alternatives, risks and benefits explained, specific risks discussed. Consent was given by the patient. Immediately prior to procedure a time out was called to verify the correct patient, procedure, equipment, support staff and site/side marked as required. Patient was prepped and draped in the usual sterile fashion.     Plan: She knows to wait at least 3 months between cortisone injections.  Follow-up as needed.  Reviewed quad strengthening exercises with her.

## 2024-06-11 ENCOUNTER — Other Ambulatory Visit: Payer: Self-pay | Admitting: Orthopaedic Surgery

## 2024-07-11 ENCOUNTER — Ambulatory Visit: Admitting: Family

## 2024-07-16 ENCOUNTER — Encounter: Payer: Self-pay | Admitting: Physician Assistant

## 2024-07-16 ENCOUNTER — Ambulatory Visit: Admitting: Physician Assistant

## 2024-07-16 DIAGNOSIS — M1712 Unilateral primary osteoarthritis, left knee: Secondary | ICD-10-CM

## 2024-07-16 DIAGNOSIS — M1711 Unilateral primary osteoarthritis, right knee: Secondary | ICD-10-CM

## 2024-07-16 DIAGNOSIS — M170 Bilateral primary osteoarthritis of knee: Secondary | ICD-10-CM

## 2024-07-16 MED ORDER — LIDOCAINE HCL 1 % IJ SOLN
3.0000 mL | INTRAMUSCULAR | Status: AC | PRN
  Administered 2024-07-16: 3 mL

## 2024-07-16 MED ORDER — METHYLPREDNISOLONE ACETATE 40 MG/ML IJ SUSP
40.0000 mg | INTRAMUSCULAR | Status: AC | PRN
  Administered 2024-07-16: 40 mg via INTRA_ARTICULAR

## 2024-07-16 NOTE — Progress Notes (Signed)
   Procedure Note  Patient: Mercedes Gardner             Date of Birth: September 02, 1946           MRN: [id number]             Visit Date: 07/16/2024 HPI: Mrs. Magloire comes in today requesting bilateral knee injections.  She has had no new injuries to either knee.  She states that the injections on 04/02/2024 gave her good relief until recently.  She has had no fevers chills.  No acute infections.  Physical exam: General Well-developed well-nourished female who ambulates without any assistive device.  Nonantalgic gait. Bilateral knees: Good range of motion of both knees.  No abnormal warmth erythema or effusion of either knee.  Varus deformities both knees.  No gross instability with varus valgus stressing of either knee.  Impression: Bilateral knee osteoarthritis     Procedures: Visit Diagnoses: No diagnosis found.  Large Joint Inj: bilateral knee on 07/16/2024 5:47 PM Indications: pain Details: 22 G 1.5 in needle, anterolateral approach  Arthrogram: No  Medications (Right): 3 mL lidocaine  1 %; 40 mg methylPREDNISolone  acetate 40 MG/ML Medications (Left): 3 mL lidocaine  1 %; 40 mg methylPREDNISolone  acetate 40 MG/ML Outcome: tolerated well, no immediate complications Procedure, treatment alternatives, risks and benefits explained, specific risks discussed. Consent was given by the patient. Immediately prior to procedure a time out was called to verify the correct patient, procedure, equipment, support staff and site/side marked as required. Patient was prepped and draped in the usual sterile fashion.     Plan: She will follow-up with us  as needed.  She understands to wait least 3 months between cortisone injections.

## 2024-08-04 ENCOUNTER — Other Ambulatory Visit: Payer: Self-pay | Admitting: Physician Assistant

## 2024-09-03 ENCOUNTER — Encounter: Payer: Self-pay | Admitting: Gastroenterology

## 2024-09-03 ENCOUNTER — Telehealth: Payer: Self-pay

## 2024-09-03 NOTE — Telephone Encounter (Signed)
 Pt wants to schedule an appt to discuss colonoscopy or other options. Please schedule pt for an office visit.

## 2024-09-03 NOTE — Telephone Encounter (Signed)
 Called patient and scheduled her an office visit on 10/09/24 Please advise  Thank you

## 2024-09-03 NOTE — Telephone Encounter (Signed)
Great thank you so much :)

## 2024-10-09 ENCOUNTER — Ambulatory Visit: Admitting: Gastroenterology

## 2024-10-09 NOTE — Progress Notes (Deleted)
 SABRA

## 2024-11-05 ENCOUNTER — Ambulatory Visit: Admitting: Gastroenterology
